# Patient Record
Sex: Female | Born: 1975 | Race: White | Hispanic: No | Marital: Married | Smoking: Never smoker
Health system: Southern US, Community
[De-identification: ages and names within clinical notes are randomized; demographics above are authoritative.]

## PROBLEM LIST (undated history)

## (undated) DIAGNOSIS — I82409 Acute embolism and thrombosis of unspecified deep veins of unspecified lower extremity: Secondary | ICD-10-CM

## (undated) DIAGNOSIS — K219 Gastro-esophageal reflux disease without esophagitis: Secondary | ICD-10-CM

---
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
## 2006-06-30 ENCOUNTER — Emergency Department: Payer: Self-pay | Admitting: Emergency Medicine

## 2012-12-05 ENCOUNTER — Emergency Department: Payer: Self-pay | Admitting: Emergency Medicine

## 2017-08-29 ENCOUNTER — Emergency Department
Admission: EM | Admit: 2017-08-29 | Discharge: 2017-08-29 | Disposition: A | Discharge status: Home / Self Care | Payer: BLUE CROSS/BLUE SHIELD | Attending: Emergency Medicine | Admitting: Emergency Medicine

## 2017-08-29 ENCOUNTER — Encounter: Payer: Self-pay | Admitting: Emergency Medicine

## 2017-08-29 DIAGNOSIS — Y92410 Unspecified street and highway as the place of occurrence of the external cause: Secondary | ICD-10-CM | POA: Diagnosis not present

## 2017-08-29 DIAGNOSIS — F17210 Nicotine dependence, cigarettes, uncomplicated: Secondary | ICD-10-CM | POA: Diagnosis not present

## 2017-08-29 DIAGNOSIS — Y999 Unspecified external cause status: Secondary | ICD-10-CM | POA: Diagnosis not present

## 2017-08-29 DIAGNOSIS — Y9389 Activity, other specified: Secondary | ICD-10-CM | POA: Insufficient documentation

## 2017-08-29 DIAGNOSIS — S50812A Abrasion of left forearm, initial encounter: Secondary | ICD-10-CM | POA: Insufficient documentation

## 2017-08-29 DIAGNOSIS — S161XXA Strain of muscle, fascia and tendon at neck level, initial encounter: Secondary | ICD-10-CM | POA: Insufficient documentation

## 2017-08-29 DIAGNOSIS — S199XXA Unspecified injury of neck, initial encounter: Secondary | ICD-10-CM | POA: Diagnosis present

## 2017-08-29 MED ORDER — IBUPROFEN 800 MG PO TABS
800.0000 mg | ORAL_TABLET | Freq: Once | ORAL | Status: AC
  Administered 2017-08-29: 800 mg via ORAL
  Filled 2017-08-29: qty 1

## 2017-08-29 MED ORDER — BACITRACIN-NEOMYCIN-POLYMYXIN 400-5-5000 EX OINT
TOPICAL_OINTMENT | Freq: Once | CUTANEOUS | Status: AC
  Administered 2017-08-29: 1 via TOPICAL
  Filled 2017-08-29: qty 1

## 2017-08-29 MED ORDER — CYCLOBENZAPRINE HCL 10 MG PO TABS
10.0000 mg | ORAL_TABLET | Freq: Once | ORAL | Status: AC
  Administered 2017-08-29: 10 mg via ORAL
  Filled 2017-08-29: qty 1

## 2017-08-29 MED ORDER — IBUPROFEN 800 MG PO TABS: 800.0000 mg | ORAL_TABLET | Freq: Three times a day (TID) | ORAL | 0 refills | Status: DC | PRN

## 2017-08-29 MED ORDER — CYCLOBENZAPRINE HCL 5 MG PO TABS: 5.0000 mg | ORAL_TABLET | Freq: Three times a day (TID) | ORAL | 0 refills | Status: DC | PRN

## 2017-08-29 NOTE — ED Notes (Signed)
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
## 2017-08-29 NOTE — Discharge Instructions (Signed)
Please take ibuprofen and Flexeril as prescribed. Apply ice to the neck and forearm 20 minutes every hour over the next couple of days. Wear Velcro wrist brace as needed for the next 2-3 days. Return to the ER for any increasing pain, worsening symptoms or changes in health. Follow-up with orthopedics if no improvement in one week.
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
## 2018-03-07 ENCOUNTER — Other Ambulatory Visit: Payer: Self-pay | Admitting: Obstetrics and Gynecology

## 2018-03-07 DIAGNOSIS — Z1231 Encounter for screening mammogram for malignant neoplasm of breast: Secondary | ICD-10-CM

## 2018-03-28 ENCOUNTER — Ambulatory Visit
Admission: RE | Admit: 2018-03-28 | Discharge: 2018-03-28 | Disposition: A | Discharge status: Home / Self Care | Payer: BLUE CROSS/BLUE SHIELD | Source: Ambulatory Visit | Attending: Obstetrics and Gynecology | Admitting: Obstetrics and Gynecology

## 2018-03-28 DIAGNOSIS — Z1231 Encounter for screening mammogram for malignant neoplasm of breast: Secondary | ICD-10-CM

## 2018-04-19 ENCOUNTER — Other Ambulatory Visit: Payer: Self-pay

## 2018-04-19 ENCOUNTER — Encounter
Admission: RE | Admit: 2018-04-19 | Discharge: 2018-04-19 | Disposition: A | Discharge status: Home / Self Care | Payer: BLUE CROSS/BLUE SHIELD | Source: Ambulatory Visit | Attending: Obstetrics and Gynecology | Admitting: Obstetrics and Gynecology

## 2018-04-19 DIAGNOSIS — Z01812 Encounter for preprocedural laboratory examination: Secondary | ICD-10-CM | POA: Insufficient documentation

## 2018-04-19 HISTORY — DX: Acute embolism and thrombosis of unspecified deep veins of unspecified lower extremity: I82.409

## 2018-04-19 HISTORY — DX: Gastro-esophageal reflux disease without esophagitis: K21.9

## 2018-04-19 LAB — CBC
HCT: 38.2 % (ref 35.0–47.0)
Hemoglobin: 12.9 g/dL (ref 12.0–16.0)
MCH: 31.6 pg (ref 26.0–34.0)
MCHC: 33.8 g/dL (ref 32.0–36.0)
MCV: 93.3 fL (ref 80.0–100.0)
PLATELETS: 257 10*3/uL (ref 150–440)
RBC: 4.09 MIL/uL (ref 3.80–5.20)
RDW: 13.6 % (ref 11.5–14.5)
WBC: 6 10*3/uL (ref 3.6–11.0)

## 2018-04-19 LAB — BASIC METABOLIC PANEL
Anion gap: 10 (ref 5–15)
BUN: 9 mg/dL (ref 6–20)
CO2: 24 mmol/L (ref 22–32)
CREATININE: 0.79 mg/dL (ref 0.44–1.00)
Calcium: 9 mg/dL (ref 8.9–10.3)
Chloride: 105 mmol/L (ref 101–111)
GFR calc non Af Amer: >60 mL/min (ref >60)
GLUCOSE: 88 mg/dL (ref 65–99)
Potassium: 3.8 mmol/L (ref 3.5–5.1)
Sodium: 139 mmol/L (ref 135–145)

## 2018-04-19 NOTE — Patient Instructions (Signed)
Your procedure is scheduled on: Friday, April 27, 2018 Report to Day Surgery on the 2nd floor of the [facility] To find out your arrival time, please call [phone] between 1PM - 3PM on: Thursday, April 26, 2018  REMEMBER: Instructions that are not followed completely may result in serious medical risk, up to and including death; or upon the discretion of your surgeon and anesthesiologist your surgery may need to be rescheduled.  Do not eat food after midnight the night before your procedure.  No gum chewing, lozengers or hard candies.  You may however, drink CLEAR liquids up to 2 hours before you are scheduled to arrive for your surgery. Do not drink anything within 2 hours of the start of your surgery.  Clear liquids include: - water  - apple juice without pulp - clear gatorade - black coffee or tea (Do NOT add anything to the coffee or tea) Do NOT drink anything that is not on this list.  No Alcohol for 24 hours before or after surgery.  No Smoking including e-cigarettes for 24 hours prior to surgery.  No chewable tobacco products for at least 6 hours prior to surgery.  No nicotine patches on the day of surgery.  On the morning of surgery brush your teeth with toothpaste and water, you may rinse your mouth with mouthwash if you wish. Do not swallow any toothpaste or mouthwash.  Notify your doctor if there is any change in your medical condition (cold, fever, infection).  Do not wear jewelry, make-up, hairpins, clips or nail polish.  Do not wear lotions, powders, or perfumes. You may wear deodorant.  Do not shave 48 hours prior to surgery.   Contacts and dentures may not be worn into surgery.  Do not bring valuables to the hospital, including drivers license, insurance or credit cards.  [facility] is not responsible for any belongings or valuables.   TAKE THESE MEDICATIONS THE MORNING OF SURGERY:  NONE  Use CHG Soap as directed on instruction sheet.  NOW!  Stop  Anti-inflammatories (NSAIDS) such as Advil, Aleve, Ibuprofen, Motrin, Naproxen, Naprosyn and Aspirin based products such as Excedrin, Goodys Powder, BC Powder. (May take Tylenol or Acetaminophen if needed.)  NOW!  Stop ANY OVER THE COUNTER supplements until after surgery.  Wear comfortable clothing (specific to your surgery type) to the hospital.  Plan for stool softeners for home use.  If you are being admitted to the hospital overnight, leave your suitcase in the car. After surgery it may be brought to your room.  If you are being discharged the day of surgery, you will not be allowed to drive home. You will need a responsible adult to drive you home and stay with you that night.   If you are taking public transportation, you will need to have a responsible adult with you. Please confirm with your physician that it is acceptable to use public transportation.   Please call [phone] if you have any questions about these instructions.

## 2018-04-19 NOTE — H&P (Signed)
Patient ID: [name] is a 42 y.o. female presenting with Pre Op Consulting (sign consents, preadmit after)  on 04/19/2018  HPI: Hx of irregular periods always. Worsening, q3-7 weeks, large clots  TVUS 5/19:  Ut wnl  retroverted Endometrium=15.88 mm bil ovs wnl  Hx of DVT with first pregnancy, requiring anticoagulation with next 2 pregnancies. Did have vascular surgeries in [place].  EMBx: collected 04/19/18 Pap smear: 4/19 neg with neg HPV   NSVD x2 C/S x1 for breech Hx of BTL  Caprini score of 7, high risk, giving her a 4.0% chance of VTE. Recommends SCDs and 7 days of LMWH  Past Medical History:  has a past medical history of H/O blood clots.  Past Surgical History:  has a past surgical history that includes Cesarean section. Family History: family history includes Addison's disease in her mother; Cancer in her maternal grandmother; Diabetes in her mother; Heart disease in her maternal grandfather and maternal grandmother; High blood pressure (Hypertension) in her mother. Social History:  reports that she has never smoked. She has never used smokeless tobacco. She reports that she drinks alcohol. She reports that she does not use drugs. OB/GYN History:  OB History    Gravida  3   Para  3   Term  3   Preterm      AB      Living  3     SAB      TAB      Ectopic      Molar      Multiple      Live Births             Allergies: has No Known Allergies. Medications: No current outpatient medications on file.   Review of Systems: No SOB, no palpitations or chest pain, no new lower extremity edema, no nausea or vomiting or bowel or bladder complaints. See HPI for gyn specific ROS.   Exam:     BP 120/82   Ht 162.6 cm ( )   Wt 93 kg (205 lb)   LMP 03/21/2018 (Exact Date)   BMI 35.19 kg/m   General: Patient is well-groomed, well-nourished, appears stated age in no acute distress  HEENT: head is atraumatic and normocephalic, trachea is  midline, neck is supple with no palpable nodules  CV: Regular rhythm and normal heart rate, +systolic ejection murmur  Pulm: Clear to auscultation throughout lung fields with no wheezing, crackles, or rhonchi. No increased work of breathing  Abdomen: soft , no mass, non-tender, no rebound tenderness, no hepatomegaly  Pelvic: tanner stage 5 ,   External genitalia: vulva /labia no lesions  Urethra: no prolapse  Vagina: normal physiologic d/c, laxity in vaginal walls  Cervix: no lesions, no cervical motion tenderness, good descent  Uterus: normal size shape and contour, non-tender  Adnexa: no mass,  non-tender    Rectovaginal: External wnl  EMBx  Impression:   Heavy vaginal bleeding   Plan:    Patient returns for a preoperative discussion regarding her plans to proceed with surgical treatment of her menmetrorrhagia by total laparoscopic hysterectomy with bilateral salpingectomy  procedure.  We will perform a cystoscopy to evaluate the urinary tract after the procedure.   Hx of DVT with high risk Caprini score: Lovenox  subQ preop and until fully ambulatory, for 4-7 days postop.  The patient and I discussed the technical aspects of the procedure including the potential for risks and complications.  These include but are not limited to the  risk of infection requiring post-operative antibiotics or further procedures.  We talked about the risk of injury to adjacent organs including bladder, bowel, ureter, blood vessels or nerves.  We talked about the need to convert to an open incision.  We talked about the possible need for blood transfusion.  We talked about postop complications such as thromboembolic or cardiopulmonary complications.  All of her questions were answered.  Her preoperative exam was completed and the appropriate consents were signed. She is scheduled to undergo this procedure in the near future.  Specific Peri-operative Considerations:  - Consent: obtained today -  Health Maintenance: up to date - Labs: CBC, CMP preoperatively - Studies: EKG, CXR preoperatively - Bowel Preparation: None required - Abx:  Cefoxitin 2g - VTE ppx: SCDs perioperatively - Glucose Protocol: n/a - Beta-blockade: n/a  No orders of the defined types were placed in this encounter.

## 2018-04-20 NOTE — OR Nursing (Signed)
Per Rosanne Ashing in the Blood Bank, patient has positive antibodies identified with Type and Screen.

## 2018-04-26 MED ORDER — CEFAZOLIN SODIUM-DEXTROSE 2-4 GM/100ML-% IV SOLN
2.0000 g | INTRAVENOUS | Status: AC
  Administered 2018-04-27 (×2): 2 g via INTRAVENOUS

## 2018-04-27 ENCOUNTER — Ambulatory Visit: Payer: BLUE CROSS/BLUE SHIELD | Admitting: Anesthesiology

## 2018-04-27 ENCOUNTER — Encounter
Admission: RE | Disposition: A | Discharge status: Other Acute Inpatient Hospital | Payer: Self-pay | Source: Home / Self Care | Attending: Obstetrics and Gynecology

## 2018-04-27 ENCOUNTER — Inpatient Hospital Stay
Admission: RE | Admit: 2018-04-27 | Discharge: 2018-05-19 | DRG: 742 | Disposition: A | Discharge status: Other Acute Inpatient Hospital | Payer: BLUE CROSS/BLUE SHIELD | Attending: Obstetrics and Gynecology | Admitting: Obstetrics and Gynecology

## 2018-04-27 DIAGNOSIS — R140 Abdominal distension (gaseous): Secondary | ICD-10-CM

## 2018-04-27 DIAGNOSIS — K660 Peritoneal adhesions (postprocedural) (postinfection): Secondary | ICD-10-CM | POA: Diagnosis present

## 2018-04-27 DIAGNOSIS — E8809 Other disorders of plasma-protein metabolism, not elsewhere classified: Secondary | ICD-10-CM | POA: Diagnosis not present

## 2018-04-27 DIAGNOSIS — I9581 Postprocedural hypotension: Secondary | ICD-10-CM | POA: Diagnosis not present

## 2018-04-27 DIAGNOSIS — E8770 Fluid overload, unspecified: Secondary | ICD-10-CM | POA: Diagnosis not present

## 2018-04-27 DIAGNOSIS — E875 Hyperkalemia: Secondary | ICD-10-CM | POA: Diagnosis not present

## 2018-04-27 DIAGNOSIS — R52 Pain, unspecified: Secondary | ICD-10-CM

## 2018-04-27 DIAGNOSIS — F4323 Adjustment disorder with mixed anxiety and depressed mood: Secondary | ICD-10-CM | POA: Diagnosis not present

## 2018-04-27 DIAGNOSIS — I82221 Chronic embolism and thrombosis of inferior vena cava: Secondary | ICD-10-CM | POA: Diagnosis present

## 2018-04-27 DIAGNOSIS — I82220 Acute embolism and thrombosis of inferior vena cava: Secondary | ICD-10-CM | POA: Diagnosis not present

## 2018-04-27 DIAGNOSIS — R000 Tachycardia, unspecified: Secondary | ICD-10-CM | POA: Diagnosis not present

## 2018-04-27 DIAGNOSIS — Y838 Other surgical procedures as the cause of abnormal reaction of the patient, or of later complication, without mention of misadventure at the time of the procedure: Secondary | ICD-10-CM | POA: Diagnosis not present

## 2018-04-27 DIAGNOSIS — J9690 Respiratory failure, unspecified, unspecified whether with hypoxia or hypercapnia: Secondary | ICD-10-CM

## 2018-04-27 DIAGNOSIS — N99841 Postprocedural hematoma of a genitourinary system organ or structure following other procedure: Secondary | ICD-10-CM | POA: Diagnosis not present

## 2018-04-27 DIAGNOSIS — N921 Excessive and frequent menstruation with irregular cycle: Principal | ICD-10-CM | POA: Diagnosis present

## 2018-04-27 DIAGNOSIS — R310 Gross hematuria: Secondary | ICD-10-CM | POA: Diagnosis not present

## 2018-04-27 DIAGNOSIS — Y846 Urinary catheterization as the cause of abnormal reaction of the patient, or of later complication, without mention of misadventure at the time of the procedure: Secondary | ICD-10-CM | POA: Diagnosis not present

## 2018-04-27 DIAGNOSIS — E871 Hypo-osmolality and hyponatremia: Secondary | ICD-10-CM | POA: Diagnosis not present

## 2018-04-27 DIAGNOSIS — Z452 Encounter for adjustment and management of vascular access device: Secondary | ICD-10-CM

## 2018-04-27 DIAGNOSIS — Z86718 Personal history of other venous thrombosis and embolism: Secondary | ICD-10-CM

## 2018-04-27 DIAGNOSIS — R601 Generalized edema: Secondary | ICD-10-CM | POA: Diagnosis not present

## 2018-04-27 DIAGNOSIS — N736 Female pelvic peritoneal adhesions (postinfective): Secondary | ICD-10-CM | POA: Diagnosis present

## 2018-04-27 DIAGNOSIS — Z6835 Body mass index (BMI) 35.0-35.9, adult: Secondary | ICD-10-CM

## 2018-04-27 DIAGNOSIS — N1330 Unspecified hydronephrosis: Secondary | ICD-10-CM | POA: Diagnosis not present

## 2018-04-27 DIAGNOSIS — I959 Hypotension, unspecified: Secondary | ICD-10-CM | POA: Diagnosis not present

## 2018-04-27 DIAGNOSIS — D62 Acute posthemorrhagic anemia: Secondary | ICD-10-CM | POA: Diagnosis not present

## 2018-04-27 DIAGNOSIS — Z90710 Acquired absence of both cervix and uterus: Secondary | ICD-10-CM | POA: Diagnosis present

## 2018-04-27 DIAGNOSIS — A419 Sepsis, unspecified organism: Secondary | ICD-10-CM | POA: Diagnosis not present

## 2018-04-27 DIAGNOSIS — E872 Acidosis: Secondary | ICD-10-CM | POA: Diagnosis present

## 2018-04-27 DIAGNOSIS — J9811 Atelectasis: Secondary | ICD-10-CM | POA: Diagnosis not present

## 2018-04-27 DIAGNOSIS — N179 Acute kidney failure, unspecified: Secondary | ICD-10-CM | POA: Diagnosis not present

## 2018-04-27 DIAGNOSIS — M7989 Other specified soft tissue disorders: Secondary | ICD-10-CM | POA: Diagnosis not present

## 2018-04-27 DIAGNOSIS — N3289 Other specified disorders of bladder: Secondary | ICD-10-CM | POA: Diagnosis not present

## 2018-04-27 DIAGNOSIS — K219 Gastro-esophageal reflux disease without esophagitis: Secondary | ICD-10-CM | POA: Diagnosis present

## 2018-04-27 DIAGNOSIS — R609 Edema, unspecified: Secondary | ICD-10-CM

## 2018-04-27 DIAGNOSIS — T8384XA Pain from genitourinary prosthetic devices, implants and grafts, initial encounter: Secondary | ICD-10-CM | POA: Diagnosis not present

## 2018-04-27 DIAGNOSIS — N990 Postprocedural (acute) (chronic) kidney failure: Secondary | ICD-10-CM | POA: Diagnosis not present

## 2018-04-27 DIAGNOSIS — F430 Acute stress reaction: Secondary | ICD-10-CM | POA: Diagnosis not present

## 2018-04-27 DIAGNOSIS — R34 Anuria and oliguria: Secondary | ICD-10-CM | POA: Diagnosis not present

## 2018-04-27 DIAGNOSIS — E861 Hypovolemia: Secondary | ICD-10-CM | POA: Diagnosis not present

## 2018-04-27 DIAGNOSIS — N170 Acute kidney failure with tubular necrosis: Secondary | ICD-10-CM | POA: Diagnosis not present

## 2018-04-27 DIAGNOSIS — K567 Ileus, unspecified: Secondary | ICD-10-CM | POA: Diagnosis not present

## 2018-04-27 DIAGNOSIS — Z4659 Encounter for fitting and adjustment of other gastrointestinal appliance and device: Secondary | ICD-10-CM

## 2018-04-27 DIAGNOSIS — N136 Pyonephrosis: Secondary | ICD-10-CM | POA: Diagnosis present

## 2018-04-27 DIAGNOSIS — E669 Obesity, unspecified: Secondary | ICD-10-CM | POA: Diagnosis present

## 2018-04-27 DIAGNOSIS — E876 Hypokalemia: Secondary | ICD-10-CM | POA: Diagnosis not present

## 2018-04-27 DIAGNOSIS — R600 Localized edema: Secondary | ICD-10-CM | POA: Diagnosis not present

## 2018-04-27 DIAGNOSIS — N1339 Other hydronephrosis: Secondary | ICD-10-CM

## 2018-04-27 DIAGNOSIS — N19 Unspecified kidney failure: Secondary | ICD-10-CM

## 2018-04-27 HISTORY — PX: LAPAROSCOPIC LYSIS OF ADHESIONS: SHX5905

## 2018-04-27 HISTORY — PX: SUPRACERVICAL ABDOMINAL HYSTERECTOMY: SHX5393

## 2018-04-27 HISTORY — PX: CYSTOSCOPY: SHX5120

## 2018-04-27 HISTORY — PX: UNILATERAL SALPINGECTOMY: SHX6160

## 2018-04-27 HISTORY — PX: SALPINGOOPHORECTOMY: SHX82

## 2018-04-27 LAB — POCT PREGNANCY, URINE: Preg Test, Ur: NEGATIVE

## 2018-04-27 LAB — CBC
HEMATOCRIT: 35.4 % (ref 35.0–47.0)
HEMATOCRIT: 39.1 % (ref 35.0–47.0)
HEMOGLOBIN: 12.2 g/dL (ref 12.0–16.0)
Hemoglobin: 13.4 g/dL (ref 12.0–16.0)
MCH: 31.1 pg (ref 26.0–34.0)
MCH: 30.8 pg (ref 26.0–34.0)
MCHC: 34.6 g/dL (ref 32.0–36.0)
MCHC: 34.4 g/dL (ref 32.0–36.0)
MCV: 89.9 fL (ref 80.0–100.0)
MCV: 89.7 fL (ref 80.0–100.0)
PLATELETS: 215 10*3/uL (ref 150–440)
Platelets: 220 10*3/uL (ref 150–440)
RBC: 3.94 MIL/uL (ref 3.80–5.20)
RBC: 4.35 MIL/uL (ref 3.80–5.20)
RDW: 14.7 % — ABNORMAL HIGH (ref 11.5–14.5)
RDW: 15.4 % — AB (ref 11.5–14.5)
WBC: 15.7 10*3/uL — ABNORMAL HIGH (ref 3.6–11.0)
WBC: 17.2 10*3/uL — ABNORMAL HIGH (ref 3.6–11.0)

## 2018-04-27 LAB — ABO/RH: ABO/RH(D): O POS

## 2018-04-27 LAB — MASSIVE TRANSFUSION PROTOCOL ORDER (BLOOD BANK NOTIFICATION)

## 2018-04-27 LAB — HEMOGLOBIN AND HEMATOCRIT, BLOOD
HCT: 36.5 % (ref 35.0–47.0)
Hemoglobin: 12.3 g/dL (ref 12.0–16.0)

## 2018-04-27 LAB — HEPARIN ANTI-XA

## 2018-04-27 LAB — MRSA PCR SCREENING: MRSA by PCR: NEGATIVE

## 2018-04-27 LAB — GLUCOSE, CAPILLARY: Glucose-Capillary: 172 mg/dL — ABNORMAL HIGH (ref 65–99)

## 2018-04-27 LAB — PREPARE RBC (CROSSMATCH)

## 2018-04-27 SURGERY — HYSTERECTOMY, SUPRACERVICAL, ABDOMINAL
Anesthesia: General | Laterality: Right | Wound class: Clean Contaminated

## 2018-04-27 MED ORDER — SUGAMMADEX SODIUM 200 MG/2ML IV SOLN
INTRAVENOUS | Status: AC
  Filled 2018-04-27: qty 2

## 2018-04-27 MED ORDER — DIPHENHYDRAMINE HCL 50 MG/ML IJ SOLN: 12.5000 mg | Freq: Four times a day (QID) | INTRAMUSCULAR | Status: DC | PRN

## 2018-04-27 MED ORDER — GLYCOPYRROLATE 0.2 MG/ML IJ SOLN
INTRAMUSCULAR | Status: AC
  Filled 2018-04-27: qty 1

## 2018-04-27 MED ORDER — FENTANYL CITRATE (PF) 100 MCG/2ML IJ SOLN
25.0000 ug | INTRAMUSCULAR | Status: DC | PRN
  Administered 2018-04-27: 25 ug via INTRAVENOUS

## 2018-04-27 MED ORDER — SODIUM CHLORIDE 0.9 % IV SOLN: Freq: Once | INTRAVENOUS | Status: DC

## 2018-04-27 MED ORDER — SODIUM CHLORIDE 0.9 % IV SOLN
INTRAVENOUS | Status: DC | PRN
  Administered 2018-04-27: 10 ug/min via INTRAVENOUS

## 2018-04-27 MED ORDER — BUPIVACAINE HCL (PF) 0.5 % IJ SOLN
INTRAMUSCULAR | Status: AC
  Filled 2018-04-27: qty 30

## 2018-04-27 MED ORDER — ALBUMIN HUMAN 5 % IV SOLN
INTRAVENOUS | Status: AC
  Filled 2018-04-27: qty 500

## 2018-04-27 MED ORDER — ENOXAPARIN SODIUM 40 MG/0.4ML ~~LOC~~ SOLN: 40.0000 mg | SUBCUTANEOUS | Status: DC

## 2018-04-27 MED ORDER — PROPOFOL 10 MG/ML IV BOLUS
INTRAVENOUS | Status: AC
  Filled 2018-04-27: qty 20

## 2018-04-27 MED ORDER — SODIUM CHLORIDE 0.9 % IV SOLN: INTRAVENOUS | Status: DC

## 2018-04-27 MED ORDER — PHENYLEPHRINE HCL 10 MG/ML IJ SOLN
INTRAMUSCULAR | Status: DC | PRN
  Administered 2018-04-27 (×2): 200 ug via INTRAVENOUS
  Administered 2018-04-27: 100 ug via INTRAVENOUS
  Administered 2018-04-27 (×4): 200 ug via INTRAVENOUS
  Administered 2018-04-27: 100 ug via INTRAVENOUS
  Administered 2018-04-27 (×3): 200 ug via INTRAVENOUS

## 2018-04-27 MED ORDER — GLYCOPYRROLATE 0.2 MG/ML IJ SOLN
INTRAMUSCULAR | Status: DC | PRN
  Administered 2018-04-27: 0.1 mg via INTRAVENOUS

## 2018-04-27 MED ORDER — HYDROMORPHONE 1 MG/ML IV SOLN
INTRAVENOUS | Status: DC
  Administered 2018-04-27: 25 mg via INTRAVENOUS
  Administered 2018-04-27: 0.5 mg via INTRAVENOUS
  Administered 2018-04-28: 0.6 mg via INTRAVENOUS
  Administered 2018-04-28: 0 mg via INTRAVENOUS
  Administered 2018-04-28: 0.9 mg via INTRAVENOUS
  Administered 2018-04-28: 0.3 mg via INTRAVENOUS
  Administered 2018-04-29: 0.6 mg via INTRAVENOUS
  Filled 2018-04-27: qty 25

## 2018-04-27 MED ORDER — FAMOTIDINE 20 MG PO TABS
ORAL_TABLET | ORAL | Status: AC
  Administered 2018-04-27: 20 mg via ORAL
  Filled 2018-04-27: qty 1

## 2018-04-27 MED ORDER — LIDOCAINE HCL (CARDIAC) PF 100 MG/5ML IV SOSY
PREFILLED_SYRINGE | INTRAVENOUS | Status: DC | PRN
  Administered 2018-04-27: 50 mg via INTRAVENOUS

## 2018-04-27 MED ORDER — EPHEDRINE SULFATE 50 MG/ML IJ SOLN
INTRAMUSCULAR | Status: DC | PRN
  Administered 2018-04-27 (×3): 10 mg via INTRAVENOUS

## 2018-04-27 MED ORDER — FAMOTIDINE 20 MG PO TABS
20.0000 mg | ORAL_TABLET | Freq: Once | ORAL | Status: AC
  Administered 2018-04-27: 20 mg via ORAL

## 2018-04-27 MED ORDER — STERILE WATER FOR IRRIGATION IR SOLN
Status: DC | PRN
  Administered 2018-04-27: 160 mL via INTRAVESICAL

## 2018-04-27 MED ORDER — ALBUMIN HUMAN 5 % IV SOLN
INTRAVENOUS | Status: AC
  Filled 2018-04-27: qty 250

## 2018-04-27 MED ORDER — ONDANSETRON HCL 4 MG/2ML IJ SOLN
4.0000 mg | Freq: Four times a day (QID) | INTRAMUSCULAR | Status: DC | PRN
  Administered 2018-04-28 – 2018-05-19 (×14): 4 mg via INTRAVENOUS
  Filled 2018-04-27 (×16): qty 2

## 2018-04-27 MED ORDER — GABAPENTIN 300 MG PO CAPS
900.0000 mg | ORAL_CAPSULE | ORAL | Status: AC
  Administered 2018-04-27: 900 mg via ORAL

## 2018-04-27 MED ORDER — LACTATED RINGERS IV SOLN
INTRAVENOUS | Status: DC
  Administered 2018-04-27 (×4): via INTRAVENOUS

## 2018-04-27 MED ORDER — CALCIUM CHLORIDE 10 % IV SOLN
INTRAVENOUS | Status: AC
  Filled 2018-04-27: qty 10

## 2018-04-27 MED ORDER — CEFAZOLIN SODIUM-DEXTROSE 2-4 GM/100ML-% IV SOLN
INTRAVENOUS | Status: AC
  Filled 2018-04-27: qty 100

## 2018-04-27 MED ORDER — ONDANSETRON HCL 4 MG/2ML IJ SOLN
INTRAMUSCULAR | Status: AC
  Filled 2018-04-27: qty 2

## 2018-04-27 MED ORDER — SODIUM CHLORIDE 0.9 % IV SOLN
INTRAVENOUS | Status: DC | PRN
  Administered 2018-04-27: 11:00:00 via INTRAVENOUS

## 2018-04-27 MED ORDER — FENTANYL CITRATE (PF) 250 MCG/5ML IJ SOLN
INTRAMUSCULAR | Status: AC
  Filled 2018-04-27: qty 5

## 2018-04-27 MED ORDER — DOCUSATE SODIUM 100 MG PO CAPS
100.0000 mg | ORAL_CAPSULE | Freq: Two times a day (BID) | ORAL | Status: DC
  Administered 2018-04-27 – 2018-04-30 (×4): 100 mg via ORAL
  Filled 2018-04-27 (×4): qty 1

## 2018-04-27 MED ORDER — NALOXONE HCL 0.4 MG/ML IJ SOLN: 0.4000 mg | INTRAMUSCULAR | Status: DC | PRN

## 2018-04-27 MED ORDER — ONDANSETRON HCL 4 MG/2ML IJ SOLN
INTRAMUSCULAR | Status: DC | PRN
  Administered 2018-04-27 (×2): 4 mg via INTRAVENOUS

## 2018-04-27 MED ORDER — KETAMINE HCL 50 MG/ML IJ SOLN
INTRAMUSCULAR | Status: AC
  Filled 2018-04-27: qty 10

## 2018-04-27 MED ORDER — MIDAZOLAM HCL 2 MG/2ML IJ SOLN
INTRAMUSCULAR | Status: DC | PRN
  Administered 2018-04-27: 3 mg via INTRAVENOUS
  Administered 2018-04-27: 2 mg via INTRAVENOUS

## 2018-04-27 MED ORDER — MIDAZOLAM HCL 5 MG/5ML IJ SOLN
INTRAMUSCULAR | Status: AC
  Filled 2018-04-27: qty 5

## 2018-04-27 MED ORDER — BUPIVACAINE LIPOSOME 1.3 % IJ SUSP
INTRAMUSCULAR | Status: AC
  Filled 2018-04-27: qty 20

## 2018-04-27 MED ORDER — SODIUM CHLORIDE FLUSH 0.9 % IV SOLN
INTRAVENOUS | Status: AC
  Filled 2018-04-27: qty 50

## 2018-04-27 MED ORDER — PROPOFOL 10 MG/ML IV BOLUS
INTRAVENOUS | Status: DC | PRN
  Administered 2018-04-27: 150 mg via INTRAVENOUS
  Administered 2018-04-27: 50 mg via INTRAVENOUS

## 2018-04-27 MED ORDER — GABAPENTIN 400 MG PO CAPS
ORAL_CAPSULE | ORAL | Status: AC
  Administered 2018-04-27: 900 mg via ORAL
  Filled 2018-04-27: qty 1

## 2018-04-27 MED ORDER — BUPIVACAINE HCL (PF) 0.5 % IJ SOLN
INTRAMUSCULAR | Status: DC | PRN
  Administered 2018-04-27: 50 mL

## 2018-04-27 MED ORDER — LACTATED RINGERS IV SOLN
INTRAVENOUS | Status: DC
  Administered 2018-04-27 – 2018-04-28 (×3): via INTRAVENOUS

## 2018-04-27 MED ORDER — MENTHOL 3 MG MT LOZG
1.0000 | LOZENGE | OROMUCOSAL | Status: DC | PRN
  Filled 2018-04-27: qty 9

## 2018-04-27 MED ORDER — PHENYLEPHRINE HCL 10 MG/ML IJ SOLN
INTRAMUSCULAR | Status: AC
  Filled 2018-04-27: qty 1

## 2018-04-27 MED ORDER — EPHEDRINE SULFATE 50 MG/ML IJ SOLN
INTRAMUSCULAR | Status: AC
  Filled 2018-04-27: qty 1

## 2018-04-27 MED ORDER — DEXAMETHASONE SODIUM PHOSPHATE 10 MG/ML IJ SOLN
INTRAMUSCULAR | Status: DC | PRN
  Administered 2018-04-27: 10 mg via INTRAVENOUS

## 2018-04-27 MED ORDER — EVICEL 5 ML EX KIT
PACK | CUTANEOUS | Status: DC | PRN
  Administered 2018-04-27: 5 mL via TOPICAL

## 2018-04-27 MED ORDER — ONDANSETRON HCL 4 MG/2ML IJ SOLN
4.0000 mg | Freq: Four times a day (QID) | INTRAMUSCULAR | Status: DC | PRN
  Administered 2018-04-27 – 2018-04-28 (×2): 4 mg via INTRAVENOUS

## 2018-04-27 MED ORDER — SIMETHICONE 80 MG PO CHEW
160.0000 mg | CHEWABLE_TABLET | Freq: Four times a day (QID) | ORAL | Status: DC | PRN
  Administered 2018-04-27: 160 mg via ORAL
  Filled 2018-04-27: qty 2

## 2018-04-27 MED ORDER — CELECOXIB 200 MG PO CAPS
400.0000 mg | ORAL_CAPSULE | ORAL | Status: AC
Start: 2018-04-27 — End: 2018-04-27
  Administered 2018-04-27: 400 mg via ORAL

## 2018-04-27 MED ORDER — SODIUM CHLORIDE 0.9% FLUSH: 9.0000 mL | INTRAVENOUS | Status: DC | PRN

## 2018-04-27 MED ORDER — ROCURONIUM BROMIDE 50 MG/5ML IV SOLN
INTRAVENOUS | Status: AC
  Filled 2018-04-27: qty 1

## 2018-04-27 MED ORDER — ACETAMINOPHEN 500 MG PO TABS
1000.0000 mg | ORAL_TABLET | ORAL | Status: AC
  Administered 2018-04-27: 1000 mg via ORAL

## 2018-04-27 MED ORDER — ROCURONIUM BROMIDE 100 MG/10ML IV SOLN
INTRAVENOUS | Status: DC | PRN
  Administered 2018-04-27: 10 mg via INTRAVENOUS
  Administered 2018-04-27 (×3): 20 mg via INTRAVENOUS
  Administered 2018-04-27: 10 mg via INTRAVENOUS
  Administered 2018-04-27: 20 mg via INTRAVENOUS
  Administered 2018-04-27: 50 mg via INTRAVENOUS

## 2018-04-27 MED ORDER — KETAMINE HCL 10 MG/ML IJ SOLN
INTRAMUSCULAR | Status: DC | PRN
  Administered 2018-04-27 (×2): 25 mg via INTRAVENOUS

## 2018-04-27 MED ORDER — ENOXAPARIN SODIUM 40 MG/0.4ML ~~LOC~~ SOLN
40.0000 mg | SUBCUTANEOUS | Status: AC
  Administered 2018-04-27: 40 mg via SUBCUTANEOUS
  Filled 2018-04-27 (×2): qty 0.4

## 2018-04-27 MED ORDER — DIPHENHYDRAMINE HCL 12.5 MG/5ML PO ELIX
12.5000 mg | ORAL_SOLUTION | Freq: Four times a day (QID) | ORAL | Status: DC | PRN
  Filled 2018-04-27: qty 5

## 2018-04-27 MED ORDER — HYDROMORPHONE HCL 1 MG/ML IJ SOLN
INTRAMUSCULAR | Status: DC | PRN
  Administered 2018-04-27 (×2): 1 mg via INTRAVENOUS

## 2018-04-27 MED ORDER — ONDANSETRON HCL 4 MG/2ML IJ SOLN: 4.0000 mg | Freq: Once | INTRAMUSCULAR | Status: DC | PRN

## 2018-04-27 MED ORDER — HYDROMORPHONE HCL 1 MG/ML IJ SOLN
INTRAMUSCULAR | Status: AC
  Filled 2018-04-27: qty 1

## 2018-04-27 MED ORDER — ROCURONIUM BROMIDE 50 MG/5ML IV SOLN
INTRAVENOUS | Status: AC
  Filled 2018-04-27: qty 2

## 2018-04-27 MED ORDER — ONDANSETRON HCL 4 MG PO TABS: 4.0000 mg | ORAL_TABLET | Freq: Four times a day (QID) | ORAL | Status: DC | PRN

## 2018-04-27 MED ORDER — CELECOXIB 200 MG PO CAPS
ORAL_CAPSULE | ORAL | Status: AC
  Administered 2018-04-27: 400 mg via ORAL
  Filled 2018-04-27: qty 2

## 2018-04-27 MED ORDER — SUGAMMADEX SODIUM 200 MG/2ML IV SOLN
INTRAVENOUS | Status: DC | PRN
  Administered 2018-04-27: 100 mg via INTRAVENOUS
  Administered 2018-04-27: 200 mg via INTRAVENOUS

## 2018-04-27 MED ORDER — SODIUM CHLORIDE 0.9 % IV SOLN
INTRAVENOUS | Status: DC | PRN
  Administered 2018-04-27: 70 mL

## 2018-04-27 MED ORDER — EVICEL 5 ML EX KIT
PACK | CUTANEOUS | Status: AC
  Filled 2018-04-27: qty 1

## 2018-04-27 MED ORDER — CALCIUM CHLORIDE 10 % IV SOLN
INTRAVENOUS | Status: DC | PRN
  Administered 2018-04-27: 1 g via INTRAVENOUS

## 2018-04-27 MED ORDER — LIDOCAINE HCL (PF) 2 % IJ SOLN
INTRAMUSCULAR | Status: AC
  Filled 2018-04-27: qty 10

## 2018-04-27 MED ORDER — ALBUMIN HUMAN 5 % IV SOLN
INTRAVENOUS | Status: DC | PRN
  Administered 2018-04-27 (×3): via INTRAVENOUS

## 2018-04-27 MED ORDER — PROMETHAZINE HCL 25 MG RE SUPP
25.0000 mg | Freq: Four times a day (QID) | RECTAL | Status: DC | PRN
  Administered 2018-04-27: 25 mg via RECTAL
  Filled 2018-04-27: qty 1

## 2018-04-27 MED ORDER — DEXAMETHASONE SODIUM PHOSPHATE 10 MG/ML IJ SOLN
INTRAMUSCULAR | Status: AC
  Filled 2018-04-27: qty 1

## 2018-04-27 MED ORDER — FENTANYL CITRATE (PF) 100 MCG/2ML IJ SOLN
INTRAMUSCULAR | Status: DC | PRN
  Administered 2018-04-27: 50 ug via INTRAVENOUS
  Administered 2018-04-27: 150 ug via INTRAVENOUS
  Administered 2018-04-27: 100 ug via INTRAVENOUS
  Administered 2018-04-27 (×2): 50 ug via INTRAVENOUS
  Administered 2018-04-27 (×3): 100 ug via INTRAVENOUS
  Administered 2018-04-27: 50 ug via INTRAVENOUS

## 2018-04-27 MED ORDER — ACETAMINOPHEN 500 MG PO TABS
ORAL_TABLET | ORAL | Status: AC
  Administered 2018-04-27: 1000 mg via ORAL
  Filled 2018-04-27: qty 2

## 2018-04-27 MED ORDER — FENTANYL CITRATE (PF) 100 MCG/2ML IJ SOLN
INTRAMUSCULAR | Status: AC
  Administered 2018-04-27: 25 ug via INTRAVENOUS
  Filled 2018-04-27: qty 2

## 2018-04-27 MED ORDER — PROMETHAZINE HCL 25 MG/ML IJ SOLN
12.5000 mg | Freq: Four times a day (QID) | INTRAMUSCULAR | Status: DC | PRN
  Administered 2018-04-28 – 2018-04-29 (×5): 25 mg via INTRAVENOUS
  Filled 2018-04-27 (×6): qty 1

## 2018-04-27 MED ORDER — METHYLENE BLUE 0.5 % INJ SOLN
INTRAVENOUS | Status: AC
  Filled 2018-04-27: qty 10

## 2018-04-27 SURGICAL SUPPLY — 97 items
APPLIER CLIP 13 LRG OPEN (CLIP) ×7
BAG URINE DRAINAGE (UROLOGICAL SUPPLIES) ×7 IMPLANT
BARRIER ADHS 3X4 INTERCEED (GAUZE/BANDAGES/DRESSINGS) ×14 IMPLANT
BENZOIN TINCTURE PRP APPL 2/3 (GAUZE/BANDAGES/DRESSINGS) ×7 IMPLANT
BLADE SURG SZ10 CARB STEEL (BLADE) ×7 IMPLANT
BLADE SURG SZ11 CARB STEEL (BLADE) ×7 IMPLANT
CATH FOLEY 2WAY  5CC 16FR (CATHETERS) ×2
CATH ROBINSON RED A/P 16FR (CATHETERS) ×7 IMPLANT
CATH URTH 16FR FL 2W BLN LF (CATHETERS) ×5 IMPLANT
CHLORAPREP W/TINT 26ML (MISCELLANEOUS) ×7 IMPLANT
CLIP APPLIE 13 LRG OPEN (CLIP) ×5 IMPLANT
CLOSURE WOUND 1/2 X4 (GAUZE/BANDAGES/DRESSINGS) ×1
CLOSURE WOUND 1/4X4 (GAUZE/BANDAGES/DRESSINGS) ×1
CNTNR SPEC 2.5X3XGRAD LEK (MISCELLANEOUS) ×5
CONT SPEC 4OZ STER OR WHT (MISCELLANEOUS) ×2
CONTAINER SPEC 2.5X3XGRAD LEK (MISCELLANEOUS) ×5 IMPLANT
CORD MONOPOLAR M/FML 12FT (MISCELLANEOUS) ×7 IMPLANT
COUNTER NEEDLE 20/40 LG (NEEDLE) ×28 IMPLANT
COVER LIGHT HANDLE STERIS (MISCELLANEOUS) ×14 IMPLANT
DERMABOND ADVANCED (GAUZE/BANDAGES/DRESSINGS) ×2
DERMABOND ADVANCED .7 DNX12 (GAUZE/BANDAGES/DRESSINGS) ×5 IMPLANT
DEVICE SUTURE ENDOST 10MM (ENDOMECHANICALS) IMPLANT
DRAPE STERI POUCH LG 24X46 STR (DRAPES) ×7 IMPLANT
DRSG OPSITE POSTOP 4X12 (GAUZE/BANDAGES/DRESSINGS) ×7 IMPLANT
DRSG TEGADERM 2-3/8X2-3/4 SM (GAUZE/BANDAGES/DRESSINGS) ×21 IMPLANT
ELECT BLADE 6.5 EXT (BLADE) ×7 IMPLANT
ELECT CAUTERY BLADE 6.4 (BLADE) ×7 IMPLANT
GLOVE BIO SURGEON STRL SZ7 (GLOVE) ×42 IMPLANT
GLOVE INDICATOR 7.5 STRL GRN (GLOVE) ×42 IMPLANT
GOWN STRL REUS W/ TWL LRG LVL3 (GOWN DISPOSABLE) ×20 IMPLANT
GOWN STRL REUS W/ TWL XL LVL3 (GOWN DISPOSABLE) ×5 IMPLANT
GOWN STRL REUS W/TWL LRG LVL3 (GOWN DISPOSABLE) ×8
GOWN STRL REUS W/TWL XL LVL3 (GOWN DISPOSABLE) ×2
GRADUATE 1200CC STRL 31836 (MISCELLANEOUS) ×7 IMPLANT
GRASPER SUT TROCAR 14GX15 (MISCELLANEOUS) ×7 IMPLANT
HANDLE YANKAUER SUCT BULB TIP (MISCELLANEOUS) ×28 IMPLANT
HEMOSTAT ARISTA ABSORB 1G (MISCELLANEOUS) ×7 IMPLANT
HEMOSTAT ARISTA ABSORB 3G PWDR (MISCELLANEOUS) ×14 IMPLANT
IRRIGATION STRYKERFLOW (MISCELLANEOUS) IMPLANT
IRRIGATOR STRYKERFLOW (MISCELLANEOUS)
IV LACTATED RINGERS 1000ML (IV SOLUTION) ×7 IMPLANT
IV NS 1000ML (IV SOLUTION) ×2
IV NS 1000ML BAXH (IV SOLUTION) ×5 IMPLANT
KIT PINK PAD W/HEAD ARE REST (MISCELLANEOUS) ×7
KIT PINK PAD W/HEAD ARM REST (MISCELLANEOUS) ×5 IMPLANT
KIT TURNOVER CYSTO (KITS) ×7 IMPLANT
LABEL OR SOLS (LABEL) ×7 IMPLANT
LIGASURE VESSEL 5MM BLUNT TIP (ELECTROSURGICAL) ×7 IMPLANT
MANIPULATOR VCARE LG CRV RETR (MISCELLANEOUS) IMPLANT
MANIPULATOR VCARE SML CRV RETR (MISCELLANEOUS) IMPLANT
MANIPULATOR VCARE STD CRV RETR (MISCELLANEOUS) ×7 IMPLANT
NDL HPO THNWL 1X22GA REG BVL (NEEDLE) ×10 IMPLANT
NEEDLE SAFETY 22GX1 (NEEDLE) ×4
NS IRRIG 500ML POUR BTL (IV SOLUTION) ×7 IMPLANT
OCCLUDER COLPOPNEUMO (BALLOONS) ×7 IMPLANT
PACK GYN LAPAROSCOPIC (MISCELLANEOUS) ×7 IMPLANT
PAD OB MATERNITY 4.3X12.25 (PERSONAL CARE ITEMS) ×7 IMPLANT
PAD PREP 24X41 OB/GYN DISP (PERSONAL CARE ITEMS) ×7 IMPLANT
PENCIL ELECTRO HAND CTR (MISCELLANEOUS) ×7 IMPLANT
POUCH SPECIMEN RETRIEVAL 10MM (ENDOMECHANICALS) IMPLANT
SCISSORS METZENBAUM CVD 33 (INSTRUMENTS) IMPLANT
SET CYSTO W/LG BORE CLAMP LF (SET/KITS/TRAYS/PACK) ×7 IMPLANT
SLEEVE ENDOPATH XCEL 5M (ENDOMECHANICALS) ×7 IMPLANT
SPONGE GAUZE 2X2 8PLY STER LF (GAUZE/BANDAGES/DRESSINGS) ×2
SPONGE GAUZE 2X2 8PLY STRL LF (GAUZE/BANDAGES/DRESSINGS) ×12 IMPLANT
SPONGE LAP 18X18 5 PK (GAUZE/BANDAGES/DRESSINGS) ×28 IMPLANT
STAPLER INSORB 30 2030 C-SECTI (MISCELLANEOUS) ×7 IMPLANT
STRIP CLOSURE SKIN 1/2X4 (GAUZE/BANDAGES/DRESSINGS) ×6 IMPLANT
STRIP CLOSURE SKIN 1/4X4 (GAUZE/BANDAGES/DRESSINGS) ×6 IMPLANT
SURGILUBE 2OZ TUBE FLIPTOP (MISCELLANEOUS) ×7 IMPLANT
SUT CHROMIC 2 0 SH (SUTURE) ×7 IMPLANT
SUT ENDO VLOC 180-0-8IN (SUTURE) IMPLANT
SUT MAXON 2-0 1X36 HBGS-21 (SUTURE) ×14 IMPLANT
SUT MNCRL 4-0 (SUTURE) ×2
SUT MNCRL 4-0 27XMFL (SUTURE) ×5
SUT MNCRL AB 4-0 PS2 18 (SUTURE) ×7 IMPLANT
SUT PLAIN 2 0 XLH (SUTURE) ×7 IMPLANT
SUT VIC AB 0 CT1 27 (SUTURE) ×10
SUT VIC AB 0 CT1 27XCR 8 STRN (SUTURE) ×25 IMPLANT
SUT VIC AB 0 CT1 36 (SUTURE) ×35 IMPLANT
SUT VIC AB 1 CT1 36 (SUTURE) ×14 IMPLANT
SUT VIC AB 2-0 SH 27 (SUTURE) ×6
SUT VIC AB 2-0 SH 27XBRD (SUTURE) ×15 IMPLANT
SUT VIC AB 2-0 UR6 27 (SUTURE) ×7 IMPLANT
SUT VIC AB 4-0 SH 27 (SUTURE) ×2
SUT VIC AB 4-0 SH 27XANBCTRL (SUTURE) ×5 IMPLANT
SUTURE MNCRL 4-0 27XMF (SUTURE) ×5 IMPLANT
SUTURE MNCRYL 4-0 (SUTURE) ×14 IMPLANT
SYR 10ML LL (SYRINGE) ×7 IMPLANT
SYR 30ML LL (SYRINGE) ×21 IMPLANT
SYR 50ML LL SCALE MARK (SYRINGE) ×7 IMPLANT
TROCAR ENDO BLADELESS 11MM (ENDOMECHANICALS) IMPLANT
TROCAR XCEL NON-BLD 5MMX100MML (ENDOMECHANICALS) ×7 IMPLANT
TUBING CONNECTING 10 (TUBING) ×6 IMPLANT
TUBING CONNECTING 10' (TUBING) ×1
TUBING INSUF HEATED (TUBING) ×7 IMPLANT
TUBING INSUFFLATION (TUBING) ×7 IMPLANT

## 2018-04-27 NOTE — Progress Notes (Signed)
Day of Surgery Procedure(s): ATTEMPTED LAPAROSCOPIC HYSTERECTOMY CONVERTED TO SUPRACERVICAL ABDOMINAL HYSTERECTOMY (N/A) SALPINGO OOPHORECTOMY (Left) CYSTOSCOPY (N/A) UNILATERAL SALPINGECTOMY (Right) LAPAROSCOPIC LYSIS OF ADHESIONS, RETROPERITONEAL DISSECTION Subjective: Pt still drowsy, with nausea/vomiting unresponsive to antiemetics, on PCA. Feeling hot, clammy to touch. Bright red urine.   Objective: Vital signs in last 24 hours: Temp:  [97 F (36.1 C)-98 F (36.7 C)] 98 F (36.7 C) (06/07 1948) Pulse Rate:  [72-114] 113 (06/07 1948) Resp:  [9-23] 20 (06/07 1948) BP: (103-127)/(62-81) 103/62 (06/07 1948) SpO2:  [94 %-100 %] 98 % (06/07 1948) Arterial Line BP: (133-165)/(68-80) 133/70 (06/07 1625)  Intake/Output  Intake/Output Summary (Last 24 hours) at 04/27/2018 2011 Last data filed at 04/27/2018 1800 Gross per 24 hour  Intake 6848 ml  Output 4420 ml  Net 2428 ml    Physical Exam:  General: Drowsy, oriented. CV: RRR - some increased work of breathing Lungs: Clear bilaterally. GI: Soft, Nondistended. Incisions: Clean and dry. Urine: bright red, Foley in place Extremities: Nontender, no erythema, no edema. Pale, right arm and hand swollen, left normal.  Assessment/Plan: POD# 0 s/p above procedures.  1) She appears concerning to me. Despite her stable BP vital signs, I feel uncomfortable and would like her to be on closer monitoring. She appears to have an increased work of breathing, is arouseable but drowsy, and has significant nausea. She in intermittently tachycardic. I am going to recommend transfer to stepdown unit for overnight monitoring.  2. Hx of DVT, will not restart lovenox x24hrs. Hold toradol and motrin as well.  3. She did get repeat dose of Ancef in the OR, but I will watch her carefully for infection.    Christeen DouglasBethany Akon Reinoso, MD   LOS: 0 days   Christeen DouglasBethany Yoshiko Keleher 04/27/2018, 8:11 PM Patient ID: Jonelle SidleGinger B Buchner, female   DOB: 11/28/1975, 42 y.o.   MRN:  [id number]

## 2018-04-27 NOTE — Progress Notes (Signed)
Patient transported by nursing staff to ICU per MD orders. Patient stable during transport. Report given to [facility]eth, RN in ICU.
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
## 2018-04-27 NOTE — Discharge Instructions (Signed)
For the next three days, take ibuprofen and acetaminophen on a schedule, every 8 hours. You can take them together or you can intersperse them, and take one every four hours. I also gave you gabapentin for nighttime, to help you sleep and also to control pain. Take gabapentin medicines at night for at least the next 3 nights. You also have a narcotic, oxycodone, to take as needed if the above medicines don't help.  Postop constipation is a major cause of pain. Stay well hydrated, walk as you tolerate, and take over the counter senna as well as stool softeners if you need them.   Signs and Symptoms to Report Call our office at [phone] if you have any of the following.   Fever over 100.4 degrees or higher  Severe stomach pain not relieved with pain medications  Bright red bleeding that's heavier than a period that does not slow with rest  To go the bathroom a lot (frequency), you can't hold your urine (urgency), or it hurts when you empty your bladder (urinate)  Chest pain  Shortness of breath  Pain in the calves of your legs  Severe nausea and vomiting not relieved with anti-nausea medications  Signs of infection around your wounds, such as redness, hot to touch, swelling, green/yellow drainage (like pus), bad smelling discharge  Any concerns  What You Can Expect after Surgery  You may see some pink tinged, bloody fluid and bruising around the wound. This is normal.  You may have a sore throat because of the tube in your mouth during general anesthesia. This will go away in 2 to 3 days.  You may have some stomach cramps.  You may notice spotting on your panties.  You may have pain around the incision sites.   Activities after Your Discharge Follow these guidelines to help speed your recovery at home:  Do the coughing and deep breathing as you did in the hospital for 2 weeks. Use the small blue breathing device, called the incentive spirometer for 2 weeks.  Don't drive if you are  in pain or taking narcotic pain medicine. You may drive when you can safely slam on the brakes, turn the wheel forcefully, and rotate your torso comfortably. This is typically 1-2 weeks. Practice in a parking lot or side street prior to attempting to drive regularly.   Ask others to help with household chores for 4 weeks.  Do not lift anything heavier that 10 pounds for 4-6 weeks. This includes pets, children, and groceries.  Don't do strenuous activities, exercises, or sports like vacuuming, tennis, squash, etc. until your doctor says it is safe to do so. ---If you had a hysterectomy (abdominal, laparoscopic, or vaginal) do not have intercourse for 8-10 weeks.   Walk as you feel able. Rest often since it may take two or three weeks for your energy level to return to normal.   You may climb stairs  Avoid constipation:   -Eat fruits, vegetables, and whole grains. Eat small meals as your appetite will take time to return to normal.   -Drink 6 to 8 glasses of water each day unless your doctor has told you to limit your fluids.   -Use a laxative or stool softener as needed if constipation becomes a problem. You may take Miralax, metamucil, Citrucil, Colace, Senekot, FiberCon, etc. If this does not relieve the constipation, try two tablespoons of Milk Of Magnesia every 8 hours until your bowels move.   You may shower. Gently wash   the wounds with a mild soap and water. Pat dry.  Do not get in a hot tub, swimming pool, etc. until your doctor agrees.  Do not use lotions, oils, powders on the wounds.  Do not douche, use tampons, or have sex until your doctor says it is okay.  Take your pain medicine when you need it. The medicine may not work as well if the pain is bad.  Take the medicines you were taking before surgery. Other medications you will need are pain medications (Norco or Percocet) and nausea medications (Zofran).  Here is a helpful article from the website  http://mitchell.org/BootyMD.com, regarding constipation  Here are reasons why constipation occurs after surgery: 1) During the operation and in the recovery room, most people are given opioid pain medication, primarily through an IV, to treat moderate or severe pain. Intravenous opioids include morphine, Dilaudid and fentanyl. After surgery, patients are often prescribed opioid pain medication to take by mouth at home, including codeine, Vicodin, Norco, and Percocet. All of these medications cause constipation by slowing down the movement of your intestine. 2) Changes in your diet before surgery can be another culprit. It is common to get specific instructions to change how you normally eat or drink before your surgery, like only having liquids the day before or not having anything to eat or drink after midnight the night before surgery. For this reason, temporary dehydration may occur. This, along with not eating or only having liquids, means that you are getting less fiber than usual. Both these factors contribute to constipation. 3) Changes in your diet after surgery can also contribute to the problem. Although many people dont have dietary restrictions after operations, being under anesthesia can make you lose your appetite for several hours and maybe even days. Some people can even have nausea or vomiting. Not eating or drinking normally means that you are not getting enough fiber and you can get dehydrated, both leading to constipation. 4) Lying in a bed more than usual--which happens before, during and after surgery--combined with the medications and diet changes, all work together to slow down your colon and make your poop turn to rock.  No one likes to be constipated.  Lets face it, its not a pleasant feeling when you dont poop for days, then strain on the toilet to finally pass something large enough to cause damage. An ounce of prevention is worth a pound of cure, so: 1. Assume you will be  constipated. 2. Plan and prepare accordingly. Post-surgery is one of those unique situations where the temporary use of laxatives can make a world of difference. Always consult with your doctor, and recognize that if you wait several days after surgery to take a laxative, the constipation might be too severe for these over-the-counter options. It is always important to discuss all medications you plan on taking with your doctor. Ask your doctor if you can start the laxative immediately after surgery. *  Here are go-to post-surgery laxatives: Senna: Senna is an herb that acts as a stimulant laxative, meaning it increases the activity of the intestine to cause you to have a bowel movement. It comes in many forms, but senna pills are easy to take and are sold over the counter at almost all pharmacies. Since opioid pain medications slow down the activity of the intestine, it makes sense to take a medication to help reverse that side effect. Long-term use of a stimulant laxative is not a good idea since it can make your  colon lazy and not function properly; however, temporary use immediately after surgery is acceptable. In general, if you are able to eat a normal diet, taking senna soon after surgery works the best. Senna usually works within hours to produce a bowel movement, but this is less predictable when you are taking different medications after surgery. Try not to wait several days to start taking senna, as often it is too late by then. Just like with all medications or supplements, check with your doctor before starting new treatment.   Magnesium: Magnesium is an important mineral that our body needs. We get magnesium from some foods that we eat, especially foods that are high in fiber such as broccoli, almonds and whole grains. There are also magnesium-based medications used to treat constipation including milk of magnesia (magnesium hydroxide), magnesium citrate and magnesium oxide. They work by  drawing water into the intestine, putting it into the class of osmotic laxatives. Magnesium products in low doses appear to be safe, but if taken in very large doses, can lead to problems such as irregular heartbeat, low blood pressure and even death. It can also affect other medications you might be taking, therefore it is important to discuss using magnesium with your physician and pharmacist before initiating therapy. Most over-the-counter magnesium laxatives work very well to help with the constipation related to surgery, but sometimes they work too well and lead to diarrhea. Make sure you are somewhere with easy access to a bathroom, just in case.   Bisacodyl: Bisacodyl (generic name) is sold under brand names such as Dulcolax. Much like senna, it is a stimulant laxative, meaning it makes your intestines move more quickly to push out the stool. This is another good choice to start taking as soon as your doctor says you can take a laxative after surgery. It comes in pill form and as a suppository, which is a good choice for people who cannot or are not allowed to swallow pills. Studies have shown that it works as a laxative, but like most of these medications, you should use this on a short-term basis only.   Enema: Enemas strike fear in many people, but FEAR NOT! Its nowhere near as big a deal as you may think. An enema is just a way to get some liquid into your rectum by placing a specially designed device through your anus. If you have never done one, it might seem like a painful, unpleasant, uncomfortable, complicated and lengthy procedure. But in reality, its simple, takes just a few seconds and is highly effective. The small ready-made bottles you buy at the pharmacy are much easier than the hose/large rubber container type. Those recommended positions illustrated in some instructions are generally not necessary to place the enema. Its very similar to the insertion of a tampon, requiring a  slight squat. Some extra lubrication on the enemas tip (or on your anus) will make it a breeze. In certain cases, there is no substitute for a good enema. For example, if someone has not pooped for a few days, the beginning of the poop waiting to come out can become rock hard. Passing that hard stool can lead to much pain and problems like anal fissures. Inserting a little liquid to break up the rock-hard stool will help make its passage much easier. Enemas come with different liquids. Most come with saline, but there are also mineral oil options. You can also use warm water in the reusable enema containers. They all work. But since saline  can sometimes be irritating, so try a mineral oil or water enema instead.  Here are commonly recommended constipation medications that do not work well for post-surgery constipation: Docusate: Docusate (generic name) most commonly referred to as Colace (brand name) is not really a laxative, but is classified as a stool softener. Although this medication is commonly prescribed, it is not recommended for several reasons: 1) there is no good medical evidence that it works 2) even if it has an effect, which is very questionable, it is minimal and cannot combat the intestinal slowing caused by the opioid medications. Skip docusate to save money and space in your pillbox for something more effective.  PEG: Miralax (brand name) is basically a chemical called polyethylene glycol (PEG) and it has gained tremendous popularity as a laxative. This product is an osmotic laxative meaning it works by pulling water into the stool, making it softer. This is very similar to the action of natural fiber in foods and supplements. Therefore, the effect seen by this medication is not immediate, causing a bowel movement in a day or more. Is this medication strong enough to battle the constipation related to having an operation? Maybe for some people not prone to constipation. But for most  people, other laxatives are better to prevent constipation after surgery.

## 2018-04-27 NOTE — Anesthesia Postprocedure Evaluation (Signed)
Anesthesia Post Note  Patient: [name]  Procedure(s) Performed: ATTEMPTED LAPAROSCOPIC HYSTERECTOMY CONVERTED TO SUPRACERVICAL ABDOMINAL HYSTERECTOMY (N/A ) SALPINGO OOPHORECTOMY (Left ) CYSTOSCOPY (N/A ) UNILATERAL SALPINGECTOMY (Right ) LAPAROSCOPIC LYSIS OF ADHESIONS, RETROPERITONEAL DISSECTION  Patient location during evaluation: PACU Anesthesia Type: General Level of consciousness: awake and alert Pain management: pain level controlled Vital Signs Assessment: post-procedure vital signs reviewed and stable Respiratory status: spontaneous breathing, nonlabored ventilation, respiratory function stable and patient connected to nasal cannula oxygen Cardiovascular status: blood pressure returned to baseline and stable Postop Assessment: no apparent nausea or vomiting Anesthetic complications: no     Last Vitals:  Vitals:   04/27/18 1655 04/27/18 1702  BP: 118/79 120/79  Pulse: (!) 114 (!) 111  Resp: (!) 22 13  Temp:  (!) 36.1 C  SpO2: 100% 100%    Last Pain:  Vitals:   04/27/18 1702  TempSrc:   PainSc: 3                  Rafaella Kole S

## 2018-04-27 NOTE — Anesthesia Procedure Notes (Signed)
Procedure Name: Intubation Date/Time: 04/27/2018 7:35 AM Performed by: Sherol DadeMacMang, Beyounce Dickens H, CRNA Pre-anesthesia Checklist: Patient identified, Emergency Drugs available, Suction available, Patient being monitored and Timeout performed Patient Re-evaluated:Patient Re-evaluated prior to induction Oxygen Delivery Method: Circle system utilized Preoxygenation: Pre-oxygenation with 100% oxygen Induction Type: IV induction and Cricoid Pressure applied Ventilation: Mask ventilation without difficulty Laryngoscope Size: Miller and 2 Grade View: Grade II Tube type: Oral Tube size: 7.5 mm Number of attempts: 1 Airway Equipment and Method: Stylet Placement Confirmation: ETT inserted through vocal cords under direct vision,  CO2 detector,  breath sounds checked- equal and bilateral and positive ETCO2 Secured at: 20 (@teeth ) cm Tube secured with: Tape Dental Injury: Teeth and Oropharynx as per pre-operative assessment

## 2018-04-27 NOTE — Interval H&P Note (Signed)
History and Physical Interval Note:  04/27/2018 7:22 AM  [name]  has presented today for surgery, with the diagnosis of Abnormal uterine bleeding - menometrorrhagia  The various methods of treatment have been discussed with the patient and family. After consideration of risks, benefits and other options for treatment, the patient has consented to  Procedure(s): HYSTERECTOMY TOTAL LAPAROSCOPIC (N/A) LAPAROSCOPIC BILATERAL SALPINGECTOMY (Bilateral) CYSTOSCOPY (N/A) as a surgical intervention .  The patient's history has been reviewed, patient examined, no change in status, stable for surgery.  I have reviewed the patient's chart and labs.  Questions were answered to the patient's satisfaction.     Christeen DouglasBethany Markeesha Char

## 2018-04-27 NOTE — Anesthesia Post-op Follow-up Note (Signed)
Anesthesia QCDR form completed.        

## 2018-04-27 NOTE — Consult Note (Signed)
Name: [name] MRN: [id number] DOB: [birth year]    ADMISSION DATE:  04/27/2018 CONSULTATION DATE: 04/27/2018  REFERRING MD : Dr. Dalbert Garnet  CHIEF COMPLAINT: s/p Hysterectomy   BRIEF PATIENT DESCRIPTION:  42 yo female admitted s/p total hysterectomy transferred to the stepdown unit for closer monitoring   SIGNIFICANT EVENTS/STUDIES:  06/7 Pt admitted to [facility] unit s/p hysterectomy  06/7 Pt transferred to stepdown unit for closer monitoring   HISTORY OF PRESENT ILLNESS:   This is a 42 yo female with a PMH of GERD, DVT, and Menometrorrhagia.  She presented to [facility] on 06/7 for a total hysterectomy and bilateral laparoscopic salpingectomy.  During procedure pt had an extensive blood loss from abherrant vessels resulting in hypotension requiring initiation of massive transfusion protocol.  She received 750 ml of albumin, 3.6L of LR, 4 units of pRBC's, 2 pools of cryo, 1 unit of platelets, and 1 unit of FFP.  Following surgery her h&h remained stable at 13.4 and 39.1.  She was admitted to [facility] unit initially postop and transferred to the stepdown unit for closer monitoring.    PAST MEDICAL HISTORY :   has a past medical history of DVT (deep venous thrombosis) (HCC) (1992) and GERD (gastroesophageal reflux disease).  has a past surgical history that includes Venous thrombectomy (4098) and Cesarean section (2000). Prior to Admission medications   Medication Sig Start Date End Date Taking? Authorizing Provider  acetaminophen (TYLENOL) 325 MG tablet Take 650 mg by mouth every 6 (six) hours as needed.    [provider]  ibuprofen (ADVIL,MOTRIN) 200 MG tablet Take 400 mg by mouth every 6 (six) hours as needed.    [provider]   No Known Allergies  FAMILY HISTORY:  family history includes Addison's disease in her mother; Diabetes in her mother; Hypertension in her mother. SOCIAL HISTORY:  reports that she has never smoked. She has never used smokeless tobacco. She reports that  she drinks alcohol. She reports that she does not use drugs.  REVIEW OF SYSTEMS: Positives in BOLD   Constitutional: Negative for fever, chills, weight loss, malaise/fatigue and diaphoresis.  HENT: Negative for hearing loss, ear pain, nosebleeds, congestion, sore throat, neck pain, tinnitus and ear discharge.   Eyes: Negative for blurred vision, double vision, photophobia, pain, discharge and redness.  Respiratory: Negative for cough, hemoptysis, sputum production, shortness of breath, wheezing and stridor.   Cardiovascular: Negative for chest pain, palpitations, orthopnea, claudication, leg swelling and PND.  Gastrointestinal: heartburn, nausea, vomiting, abdominal pain, diarrhea, constipation, blood in stool and melena.  Genitourinary: Negative for dysuria, urgency, frequency, hematuria and flank pain.  Musculoskeletal: Negative for myalgias, back pain, joint pain and falls.  Skin: Negative for itching and rash.  Neurological: Negative for dizziness, tingling, tremors, sensory change, speech change, focal weakness, seizures, loss of consciousness, weakness and headaches.  Endo/Heme/Allergies: Negative for environmental allergies and polydipsia. Does not bruise/bleed easily.  SUBJECTIVE:  c/o nausea   VITAL SIGNS: Temp:  [97 F (36.1 C)-98 F (36.7 C)] 97.8 F (36.6 C) (06/07 2058) Pulse Rate:  [72-114] 107 (06/07 2300) Resp:  [9-23] 15 (06/07 2300) BP: (103-127)/(59-81) 113/74 (06/07 2300) SpO2:  [94 %-100 %] 100 % (06/07 2300) Arterial Line BP: (133-165)/(68-80) 133/70 (06/07 1625) FiO2 (%):  [96 %] 96 % (06/07 2031)  PHYSICAL EXAMINATION: General: well developed, well nourished female, NAD  Neuro: lethargic, oriented, follows commands  HEENT: supple, no JVD  Cardiovascular: sinus tach, no R/G Lungs: clear throughout, even, non labored  Abdomen: very faint BS x4, soft, tender, non distended Musculoskeletal: normal bulk and tone, no edema  Skin: abdominal incision dressing dry  and intact unable to visualize incision   No results for input(s): NA, K, CL, CO2, BUN, CREATININE, GLUCOSE in the last 168 hours. Recent Labs  Lab 04/27/18 1344 04/27/18 1552 04/27/18 1840  HGB 12.3 12.2 13.4  HCT 36.5 35.4 39.1  WBC  --  15.7* 17.2*  PLT  --  220 215   No results found.  ASSESSMENT / PLAN: s/p Total hysterectomy  Hemorrhagic hypotension secondary to intraop massive blood loss from abherrent vessels-resolved  Postop pain  Hx: GERD and DVT P: Supplemental O2 for dyspnea and/or hypoxia  Continuous telemetry monitoring  Maintain map >65 with aggressive fluid resuscitation  LR @125  ml/hr  Trend BMP  Replace electrolytes as indicated  Monitor UOP  Trend CBC  Monitor for s/sx of bleeding and transfuse for active bleeding and/or hgb <7 VTE px:SCD's for now, per OBGYN will start lovenox 04/29/18 Dilaudid PCA for pain management  Prn phenergan and zofran for nausea/vomiting   Sonda Rumbleana Aldonia Keeven, AGNP  Pulmonary/Critical Care Pager [phone] (please enter 7 digits) PCCM Consult Pager [phone] (please enter 7 digits)

## 2018-04-27 NOTE — Anesthesia Procedure Notes (Signed)
Arterial Line Insertion Start/End6/05/2018 12:45 PM, 04/27/2018 12:58 PM Performed by: Berdine Addisonhomas, Mathai, MD, Malva CoganBeane, Catherine, CRNA, CRNA  Patient location: OR. Preanesthetic checklist: patient identified, IV checked, site marked, risks and benefits discussed, surgical consent, monitors and equipment checked, pre-op evaluation, timeout performed and anesthesia consent Right, radial was placed  Attempts: 1 Procedure performed without using ultrasound guided technique. Following insertion, dressing applied.

## 2018-04-27 NOTE — Progress Notes (Signed)
Dr Karlton Lemonkarenz called  Pt heart rate from 90's to 120 but not sustained    No new orders at present

## 2018-04-27 NOTE — Transfer of Care (Signed)
Immediate Anesthesia Transfer of Care Note  Patient: [name]  Procedure(s) Performed: ATTEMPTED LAPAROSCOPIC HYSTERECTOMY CONVERTED TO SUPRACERVICAL ABDOMINAL HYSTERECTOMY (N/A ) SALPINGO OOPHORECTOMY (Left ) CYSTOSCOPY (N/A ) UNILATERAL SALPINGECTOMY (Right ) LAPAROSCOPIC LYSIS OF ADHESIONS, RETROPERITONEAL DISSECTION  Patient Location: PACU  Anesthesia Type:General  Level of Consciousness: drowsy and patient cooperative  Airway & Oxygen Therapy: Patient Spontanous Breathing and Patient connected to face mask oxygen  Post-op Assessment: Report given to RN, Post -op Vital signs reviewed and stable and Patient moving all extremities  Post vital signs: Reviewed and stable  Last Vitals:  Vitals Value Taken Time  BP 127/81 04/27/2018  3:13 PM  Temp 36.3 C 04/27/2018  3:05 PM  Pulse 106 04/27/2018  3:14 PM  Resp 11 04/27/2018  3:14 PM  SpO2 100 % 04/27/2018  3:14 PM  Vitals shown include unvalidated device data.  Last Pain:  Vitals:   04/27/18 0614  TempSrc: Tympanic  PainSc: 0-No pain         Complications: No apparent anesthesia complications

## 2018-04-27 NOTE — Op Note (Signed)
[name] PROCEDURE DATE: 04/27/2018  PREOPERATIVE DIAGNOSIS: 1.  Menorrhagia 2. Hx of DVT  POSTOPERATIVE DIAGNOSIS:   1. Menorrhagia 2. Hx of DVT 3. Abherrent vasculature 4. Intraop massive blood loss from abherrent vessels, but not out of control.  SURGEON:   Amy DouglasBethany Whitaker Holderman, MD. ASSISTANRanae [name]: [name], M.D. ANESTHESIOLOGIST: [name], Mathai, MD Anesthesiologist: [name], Mathai, MD CRNA: [name], Catherine, CRNA; [name], Amy H, CRNA; [name], Stephanie, CRNA  OPERATION:   Laparoscopic lysis of adhesions Laparoscopic retroperitoneal disecion Decision to convert to laparotomy Left salping-oophorectomy Right salpingectomy Supracervical hysterectomy  Cystoscopy  ANESTHESIA:  General endotracheal.  INDICATIONS: The patient is a 42 y.o. F with a hx of heavy menstrual bleeding and a hx of DVT who desires definitive surgical management. On the preoperative visit, the risks, benefits, indications, and alternatives of the procedure were reviewed with the patient.  On the day of surgery, the risks of surgery were again discussed with the patient including but not limited to: bleeding which may require transfusion or reoperation; infection which may require antibiotics; injury to bowel, bladder, ureters or other surrounding organs; need for additional procedures; thromboembolic phenomenon, incisional problems and other postoperative/anesthesia complications. Written informed consent was obtained.    OPERATIVE FINDINGS: A 9 week size uterus surrounded by extensive enlarged vascularity. Her adnexa were enlarged almost to the size of the uterus, most prominent on the left, but never normal. 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 Both ureters were visible and pulsatile.  Throughout the case, she had enlarged vessels coming from her bilateral pelvic sidewalls, coming from inside the cervix itself, and all along the lateral uterine edges. At every clamp and tie  she bled from both sides, even after we had devascularized down the uterus and cervix.   ESTIMATED BLOOD LOSS: 3600 ml FLUIDS:   3600 ml of Lactated Ringers 750 ml of albumin 4 units of packed RBCs 2 pools of cryo 1 pack of platelets 1 unit Fresh Frozen Plasma  URINE OUTPUT:  600 ml of clear yellow urine. SPECIMENS:  Uterus,cervix,  bilateral fallopian tubes (and left ovary) sent to pathology COMPLICATIONS:   - convert to open to assist in vascular control - extensive blood loss, treated promptly and effectively (per her vital signs) with the massive transfusion protocol   DESCRIPTION OF PROCEDURE:  The patient received intravenous antibiotics, 40u Lovenox and had sequential compression devices applied to her lower extremities while in the preoperative area.   She was taken to the operating room and placed under general anesthesia without difficulty.The abdomen and perineum were prepped and draped in a sterile manner, and she was placed in a dorsal Lithotomy position.  A V-care uterine manipulator was placed at this time.  A Foley catheter was inserted into her bladder and attached to constant drainage. Attention was turned to the abdomen and 0.5% Marcaine infused subq. A 5mm umbilical incision was made with the scalpel.  The Optiview 5-mm trocar and sleeve were then advanced without difficulty with the laparoscope under direct visualization into the abdomen.  Omental adhesions were noted at the umbilicus, and no visualization was able to be obtained.  The abdomen was then insufflated with carbon dioxide gas and adequate pneumoperitoneum was obtained. Therefore, a 5 mm incision was made along the left supraclavicular line at Palmer's point, after assuring that an OG tube was placed.  Excellent survey of the abdomen and pelvis was visible from the site, and 5 mm port was placed in the right lower quadrant  under direct visualization.  The omental adhesions were taken down using LigaSure.  A 10 mm  port site was placed in the left lower quadrant under direct visualization.    A survey of the patient's pelvis and abdomen revealed the findings above..  The pelvis was then carefully examined.   We started along the left pelvic sidewall, and entered retroperitoneally above the round ligament.  It appeared that we were going to be able to peel the peritoneum away from the sidewall and expose the large vasculature from this area.  We carefully dissected down to the IP which was noted to be almost 2 cm in width.  The left ureter was visible and out of our surgical field.    There were thick adhesions between the uterus and the anterior pelvic wall, and these were taken down carefully, with 180 mL of backfill to the bladder to be certain that the bladder was not disturbed.  I did a cystoscopy at this point, to be certain that the bladder was out of the surgical field, and to fully visualized the ureter openings.  Both were extruding urine, and there was nothing unusual about the bladder.  Attention was returned to the abdomen, and the right side adnexa was evaluated similarly.  At this point, it was apparent that the vasculature along the left and right uterus were so convoluted and extensive, and filled the pelvis, that we were unable to see the insertion of the cervix or interpret where the uterosacral ligaments were.  At this point we made the decision to convert to open.  A 10 mm fascial site was closed using 0 Vicryl, and all 4 of the laparoscopic incision sites were closed with 4-0 Monocryl and Dermabond.  A Pfannensteil skin incision was made. This incision was taken down to the fascia using electrocautery with care given to maintain good hemostasis. However, irregular blood vessels were noted throughout the fascia, but were not bleeding heavily. The fascia was incised in the midline and the fascial incision was then extended bilaterally using electrocautery without difficulty. The fascia was  then dissected off the underlying rectus muscles using blunt and sharp dissection. The rectus muscles were split bluntly in the midline and the peritoneum entered sharply without complication.  However, in the preperitoneal fat there were extensive blood vessels, and these needed to be closed with suture, as cautery was not effective due to their caliber.  This peritoneal incision was then extended superiorly and inferiorly with care given to prevent bowel or bladder injury.  On the left side, a Maylard incision was made through the left rectus muscles, to allow visualization of the left pelvic sidewall.  Care was taken to assure hemostasis along the rectus muscles.   Attention was then turned to the pelvis. A Bookwalter retractor was placed into the incision, and the bowel was packed away with moist laparotomy sponges. The uterus at this point was noted to be mobilized and was delivered up out of the abdomen.  Taking care, a window was made in the broad ligament bilaterally, the round ligaments on each side were clamped, suture ligated with 0 Vicryl, and transected with electrocautery allowing entry into the broad ligament. Of note, all sutures used in this procedure are 0 Vicryl unless otherwise noted. On the left, the vascular bed was circumvented laterally, and it was clear that the left ovarian vessels were not easily seperatable from the rest of the vascular bed.  Therefore, the IP ligament was triply clamped, tied, and  ligated.  It was further suture tied x2 on both edges of the pedicle.    The anterior and posterior leaves of the broad ligament were separated, and the ureters were inspected to be safely away from the area of dissection bilaterally.  Adnexae were clamped on the patient's right side, cut, and doubly suture ligated. Kelly clamps were placed on the mesosalpinx of the right fallopian tube, and the fallopian tube was excised.  The pedicle was then secured with a free tie.  A similar process  was carried out on the left side, allowing for bilateral salpingectomy.      A bladder flap was then created.  The bladder was then bluntly dissected off the lower uterine segment and cervix with poor hemostasis noted, and sharply taken down from then on. The uterine arteries were not able to be skeletonized bilaterally, and we continued to clamp and cut as needed down both sides of the uterus.  Once we had passed the internal loss of the cervix, we realized that her bleeding had not abated, and there continue to be large vessels along her cervix.  The decision was made at this point to amputate the cervix, control the bleeding, and into the case with a supracervical hysterectomy.  Of note, had blood loss of about a liter and a half, we called for blood.  At blood loss of about 2 L, we called for the massive transfusion protocol to speed along blood products.  She did receive the blood products noted above, and her vital signs were stable throughout the case.  The team was excellent in affecting these interventions, and anesthesia worked well to get her the fluids and products she needed.  The middle of the cervical cuff was closed with a series of interrupted figure-of-eight sutures with care, after bovie cauterizing along the endocervical canal.  The pelvis was irrigated and hemostasis was reconfirmed at all pedicles and along the pelvic sidewall.  This assuring hemostasis took 2 hours of the case, and at the end she was noted to be hemostatic along all pedicles.    The ureters were inspected and noted to be peristalsing bilaterally.  All laparotomy sponges and instruments were removed from the abdomen.  An adhesion barrier was placed under the peritoneum, and the fascia was closed in a running fashion. The subcutaneous layer was reapproximated with 2-0 plain gut.  Sponge, lap, needle, and instrument counts were correct times two.   We injected Exparel along the peritoneal and skin lines.  We closed the  skin with Ensorb sutures.  The uterine manipulator was removed from her vagina, and a second cystoscopy noted blood in the cervix but no bladder rents or tears, and bilaterally ureteral jets.  In [name]&[name] in the middle of the case revealed her hemoglobin to be the same as at the start of the case, and we will plan for close monitoring on the floor.  Because she was extubated and all her vital signs are stable, she is not going to the ICU, but we will watch her carefully.  The patient was taken to the recovery area awake, extubated and in stable condition.  I will not give her Lovenox until 24 hours after the case, and I am holding off on Toradol and Motrin.  I did speak with her family and showed them pictures.  My plan is to send her to the vascular surgeons as an outpatient, to review what these diagnoses may mean for her in the future.

## 2018-04-28 ENCOUNTER — Encounter
Admission: RE | Disposition: A | Discharge status: Other Acute Inpatient Hospital | Payer: Self-pay | Source: Home / Self Care | Attending: Obstetrics and Gynecology

## 2018-04-28 ENCOUNTER — Inpatient Hospital Stay: Payer: BLUE CROSS/BLUE SHIELD | Admitting: Certified Registered Nurse Anesthetist

## 2018-04-28 ENCOUNTER — Inpatient Hospital Stay: Payer: BLUE CROSS/BLUE SHIELD

## 2018-04-28 DIAGNOSIS — N1330 Unspecified hydronephrosis: Secondary | ICD-10-CM

## 2018-04-28 DIAGNOSIS — I9581 Postprocedural hypotension: Secondary | ICD-10-CM

## 2018-04-28 DIAGNOSIS — Z90710 Acquired absence of both cervix and uterus: Secondary | ICD-10-CM

## 2018-04-28 DIAGNOSIS — N179 Acute kidney failure, unspecified: Secondary | ICD-10-CM

## 2018-04-28 DIAGNOSIS — R000 Tachycardia, unspecified: Secondary | ICD-10-CM

## 2018-04-28 DIAGNOSIS — R310 Gross hematuria: Secondary | ICD-10-CM

## 2018-04-28 HISTORY — PX: CYSTOSCOPY W/ URETERAL STENT PLACEMENT: SHX1429

## 2018-04-28 LAB — PREPARE PLATELET PHERESIS
UNIT DIVISION: 0
Unit division: 0

## 2018-04-28 LAB — BASIC METABOLIC PANEL
ANION GAP: 12 (ref 5–15)
Anion gap: 12 (ref 5–15)
Anion gap: 11 (ref 5–15)
Anion gap: 12 (ref 5–15)
BUN: 23 mg/dL — AB (ref 6–20)
BUN: 25 mg/dL — AB (ref 6–20)
BUN: 28 mg/dL — ABNORMAL HIGH (ref 6–20)
BUN: 28 mg/dL — ABNORMAL HIGH (ref 6–20)
CALCIUM: 7.3 mg/dL — AB (ref 8.9–10.3)
CALCIUM: 7.1 mg/dL — AB (ref 8.9–10.3)
CHLORIDE: 112 mmol/L — AB (ref 101–111)
CHLORIDE: 111 mmol/L (ref 101–111)
CO2: 15 mmol/L — AB (ref 22–32)
CO2: 13 mmol/L — ABNORMAL LOW (ref 22–32)
CO2: 16 mmol/L — ABNORMAL LOW (ref 22–32)
CO2: 26 mmol/L (ref 22–32)
CREATININE: 2.09 mg/dL — AB (ref 0.44–1.00)
CREATININE: 2.58 mg/dL — AB (ref 0.44–1.00)
Calcium: 7.5 mg/dL — ABNORMAL LOW (ref 8.9–10.3)
Calcium: 7.2 mg/dL — ABNORMAL LOW (ref 8.9–10.3)
Chloride: 111 mmol/L (ref 101–111)
Chloride: 104 mmol/L (ref 101–111)
Creatinine, Ser: 2.55 mg/dL — ABNORMAL HIGH (ref 0.44–1.00)
Creatinine, Ser: 2.47 mg/dL — ABNORMAL HIGH (ref 0.44–1.00)
GFR calc Af Amer: 33 mL/min — ABNORMAL LOW (ref >60)
GFR calc Af Amer: 26 mL/min — ABNORMAL LOW (ref >60)
GFR calc Af Amer: 27 mL/min — ABNORMAL LOW (ref >60)
GFR calc non Af Amer: 28 mL/min — ABNORMAL LOW (ref >60)
GFR calc non Af Amer: 22 mL/min — ABNORMAL LOW (ref >60)
GFR, EST AFRICAN AMERICAN: 25 mL/min — AB (ref >60)
GFR, EST NON AFRICAN AMERICAN: 23 mL/min — AB (ref >60)
GFR, EST NON AFRICAN AMERICAN: 22 mL/min — AB (ref >60)
Glucose, Bld: 158 mg/dL — ABNORMAL HIGH (ref 65–99)
Glucose, Bld: 170 mg/dL — ABNORMAL HIGH (ref 65–99)
Glucose, Bld: 194 mg/dL — ABNORMAL HIGH (ref 65–99)
Glucose, Bld: 241 mg/dL — ABNORMAL HIGH (ref 65–99)
POTASSIUM: 5.8 mmol/L — AB (ref 3.5–5.1)
Potassium: 5.7 mmol/L — ABNORMAL HIGH (ref 3.5–5.1)
Potassium: 6.2 mmol/L — ABNORMAL HIGH (ref 3.5–5.1)
Potassium: 4.2 mmol/L (ref 3.5–5.1)
SODIUM: 136 mmol/L (ref 135–145)
SODIUM: 139 mmol/L (ref 135–145)
SODIUM: 142 mmol/L (ref 135–145)
Sodium: 138 mmol/L (ref 135–145)

## 2018-04-28 LAB — BPAM PLATELET PHERESIS
BLOOD PRODUCT EXPIRATION DATE: 201906082359
Blood Product Expiration Date: 201906092359
ISSUE DATE / TIME: 201906071403
ISSUE DATE / TIME: 201906071403
UNIT TYPE AND RH: 5100
Unit Type and Rh: 9500

## 2018-04-28 LAB — BPAM FFP
Blood Product Expiration Date: 201906122359
ISSUE DATE / TIME: 201906071310
Unit Type and Rh: 5100

## 2018-04-28 LAB — PREPARE FRESH FROZEN PLASMA: Unit division: 0

## 2018-04-28 LAB — PREPARE CRYOPRECIPITATE
UNIT DIVISION: 0
Unit division: 0

## 2018-04-28 LAB — CBC
HCT: 34.9 % — ABNORMAL LOW (ref 35.0–47.0)
HEMOGLOBIN: 11.7 g/dL — AB (ref 12.0–16.0)
MCH: 30 pg (ref 26.0–34.0)
MCHC: 33.4 g/dL (ref 32.0–36.0)
MCV: 89.7 fL (ref 80.0–100.0)
PLATELETS: 145 10*3/uL — AB (ref 150–440)
RBC: 3.89 MIL/uL (ref 3.80–5.20)
RDW: 15.9 % — AB (ref 11.5–14.5)
WBC: 17.3 10*3/uL — ABNORMAL HIGH (ref 3.6–11.0)

## 2018-04-28 LAB — BPAM CRYOPRECIPITATE
Blood Product Expiration Date: 201906071800
Blood Product Expiration Date: 201906071800
ISSUE DATE / TIME: 201906071233
ISSUE DATE / TIME: 201906071233
Unit Type and Rh: 6200
Unit Type and Rh: 6200

## 2018-04-28 LAB — LACTIC ACID, PLASMA: Lactic Acid, Venous: 4.2 mmol/L (ref 0.5–1.9)

## 2018-04-28 LAB — PROCALCITONIN: Procalcitonin: 0.86 ng/mL

## 2018-04-28 SURGERY — CYSTOSCOPY, WITH RETROGRADE PYELOGRAM AND URETERAL STENT INSERTION
Anesthesia: General | Laterality: Bilateral | Wound class: Clean Contaminated

## 2018-04-28 MED ORDER — SODIUM CHLORIDE 0.9 % IV SOLN
1.0000 g | Freq: Once | INTRAVENOUS | Status: AC
  Administered 2018-04-28: 1 g via INTRAVENOUS
  Filled 2018-04-28: qty 10

## 2018-04-28 MED ORDER — FENTANYL CITRATE (PF) 100 MCG/2ML IJ SOLN
INTRAMUSCULAR | Status: AC
  Filled 2018-04-28: qty 2

## 2018-04-28 MED ORDER — SODIUM CHLORIDE 0.9 % IV BOLUS
500.0000 mL | Freq: Once | INTRAVENOUS | Status: AC
  Administered 2018-04-28: 500 mL via INTRAVENOUS

## 2018-04-28 MED ORDER — DEXTROSE 50 % IV SOLN
50.0000 mL | Freq: Once | INTRAVENOUS | Status: AC
  Administered 2018-04-28: 50 mL via INTRAVENOUS
  Filled 2018-04-28: qty 50

## 2018-04-28 MED ORDER — SODIUM POLYSTYRENE SULFONATE 15 GM/60ML PO SUSP
15.0000 g | Freq: Once | ORAL | Status: AC
Start: 2018-04-28 — End: 2018-04-28
  Administered 2018-04-28: 15 g via ORAL

## 2018-04-28 MED ORDER — PROPOFOL 10 MG/ML IV BOLUS
INTRAVENOUS | Status: DC | PRN
  Administered 2018-04-28: 100 mg via INTRAVENOUS

## 2018-04-28 MED ORDER — ROCURONIUM BROMIDE 100 MG/10ML IV SOLN
INTRAVENOUS | Status: DC | PRN
  Administered 2018-04-28: 40 mg via INTRAVENOUS
  Administered 2018-04-28: 10 mg via INTRAVENOUS

## 2018-04-28 MED ORDER — SODIUM BICARBONATE 8.4 % IV SOLN
INTRAVENOUS | Status: DC
  Administered 2018-04-28 – 2018-04-29 (×2): via INTRAVENOUS
  Filled 2018-04-28 (×5): qty 150

## 2018-04-28 MED ORDER — DEXAMETHASONE SODIUM PHOSPHATE 10 MG/ML IJ SOLN
INTRAMUSCULAR | Status: AC
  Filled 2018-04-28: qty 1

## 2018-04-28 MED ORDER — SODIUM BICARBONATE 8.4 % IV SOLN
150.0000 meq | Freq: Once | INTRAVENOUS | Status: AC
  Administered 2018-04-28: 150 meq via INTRAVENOUS
  Filled 2018-04-28 (×3): qty 150

## 2018-04-28 MED ORDER — PHENYLEPHRINE HCL 10 MG/ML IJ SOLN
INTRAMUSCULAR | Status: DC | PRN
  Administered 2018-04-28 – 2018-04-29 (×2): 100 ug via INTRAVENOUS

## 2018-04-28 MED ORDER — CEFAZOLIN SODIUM 1 G IJ SOLR
INTRAMUSCULAR | Status: AC
  Filled 2018-04-28: qty 20

## 2018-04-28 MED ORDER — PROPOFOL 10 MG/ML IV BOLUS
INTRAVENOUS | Status: AC
  Filled 2018-04-28: qty 20

## 2018-04-28 MED ORDER — FAMOTIDINE 20 MG PO TABS
20.0000 mg | ORAL_TABLET | Freq: Two times a day (BID) | ORAL | Status: DC
  Administered 2018-04-28 – 2018-04-29 (×2): 20 mg via ORAL
  Filled 2018-04-28 (×2): qty 1

## 2018-04-28 MED ORDER — FENTANYL CITRATE (PF) 100 MCG/2ML IJ SOLN
INTRAMUSCULAR | Status: DC | PRN
  Administered 2018-04-28 – 2018-04-29 (×4): 50 ug via INTRAVENOUS

## 2018-04-28 MED ORDER — SODIUM CHLORIDE 0.9 % IV BOLUS
1000.0000 mL | Freq: Once | INTRAVENOUS | Status: AC
  Administered 2018-04-28: 450 mL via INTRAVENOUS
  Administered 2018-04-29: 50 mL via INTRAVENOUS
  Administered 2018-04-29: 400 mL via INTRAVENOUS

## 2018-04-28 MED ORDER — CEFAZOLIN SODIUM-DEXTROSE 2-3 GM-%(50ML) IV SOLR
INTRAVENOUS | Status: DC | PRN
  Administered 2018-04-28: 2 g via INTRAVENOUS

## 2018-04-28 MED ORDER — SODIUM CHLORIDE FLUSH 0.9 % IV SOLN
INTRAVENOUS | Status: AC
  Filled 2018-04-28: qty 10

## 2018-04-28 MED ORDER — SODIUM CHLORIDE 0.9 % IV BOLUS
1000.0000 mL | Freq: Once | INTRAVENOUS | Status: AC
  Administered 2018-04-28: 1000 mL via INTRAVENOUS

## 2018-04-28 MED ORDER — ORAL CARE MOUTH RINSE
15.0000 mL | Freq: Two times a day (BID) | OROMUCOSAL | Status: DC
  Administered 2018-04-28 – 2018-05-06 (×10): 15 mL via OROMUCOSAL

## 2018-04-28 MED ORDER — ONDANSETRON HCL 4 MG/2ML IJ SOLN
INTRAMUSCULAR | Status: AC
  Filled 2018-04-28: qty 2

## 2018-04-28 MED ORDER — CALCIUM CHLORIDE 10 % IV SOLN: 1.0000 g | Freq: Once | INTRAVENOUS | Status: DC

## 2018-04-28 MED ORDER — SODIUM BICARBONATE 8.4 % IV SOLN
100.0000 meq | Freq: Once | INTRAVENOUS | Status: AC
  Administered 2018-04-28: 100 meq via INTRAVENOUS
  Filled 2018-04-28: qty 100

## 2018-04-28 MED ORDER — INSULIN REGULAR HUMAN 100 UNIT/ML IJ SOLN
10.0000 [IU] | Freq: Once | INTRAMUSCULAR | Status: AC
  Administered 2018-04-28: 10 [IU] via INTRAVENOUS
  Filled 2018-04-28: qty 0.1

## 2018-04-28 MED ORDER — LORAZEPAM 2 MG/ML IJ SOLN: 1.0000 mg | Freq: Once | INTRAMUSCULAR | Status: DC

## 2018-04-28 MED ORDER — ALBUTEROL SULFATE (2.5 MG/3ML) 0.083% IN NEBU: 2.5000 mg | INHALATION_SOLUTION | Freq: Once | RESPIRATORY_TRACT | Status: DC

## 2018-04-28 SURGICAL SUPPLY — 33 items
BAG DRAIN CYSTO-URO LG1000N (MISCELLANEOUS) ×6 IMPLANT
BAG URO DRAIN 2000ML W/SPOUT (MISCELLANEOUS) ×3 IMPLANT
BAG URO DRAIN 4000ML (MISCELLANEOUS) ×3 IMPLANT
BRUSH SCRUB EZ  4% CHG (MISCELLANEOUS) ×2
BRUSH SCRUB EZ 4% CHG (MISCELLANEOUS) ×1 IMPLANT
CATH FOLEY 3WAY 30CC 24FR (CATHETERS) ×2
CATH URETL 5X70 OPEN END (CATHETERS) ×3 IMPLANT
CATH URTH STD 24FR FL 3W 2 (CATHETERS) ×1 IMPLANT
CONRAY 43 FOR UROLOGY 50M (MISCELLANEOUS) ×3 IMPLANT
DRAPE UTILITY 15X26 TOWEL STRL (DRAPES) ×3 IMPLANT
ELECT COAG BIPOLAR CYL 1.2MMM (ELECTROSURGICAL) ×3
ELECTRODE COAG BIPLR CYL 1.2MM (ELECTROSURGICAL) ×1 IMPLANT
GLIDEWIRE STIFF .35X180X3 HYDR (WIRE) ×3 IMPLANT
GLIDEWIRE STR 0.035 150CM 3CM (WIRE) ×3 IMPLANT
GOWN STRL REUS W/ TWL LRG LVL3 (GOWN DISPOSABLE) ×1 IMPLANT
GOWN STRL REUS W/TWL LRG LVL3 (GOWN DISPOSABLE) ×2
INFUSOR MANOMETER BAG 3000ML (MISCELLANEOUS) ×6 IMPLANT
KIT TURNOVER CYSTO (KITS) ×3 IMPLANT
LOOP CUT BIPOLAR 24F LRG (ELECTROSURGICAL) ×3 IMPLANT
MANIFOLD NEPTUNE II (INSTRUMENTS) ×3 IMPLANT
PACK CYSTO AR (MISCELLANEOUS) ×3 IMPLANT
SENSORWIRE 0.038 NOT ANGLED (WIRE) ×3
SET CYSTO W/LG BORE CLAMP LF (SET/KITS/TRAYS/PACK) ×3 IMPLANT
SET IRRIG Y TYPE TUR BLADDER L (SET/KITS/TRAYS/PACK) ×3 IMPLANT
SOL .9 NS 3000ML IRR  AL (IV SOLUTION) ×4
SOL .9 NS 3000ML IRR UROMATIC (IV SOLUTION) ×2 IMPLANT
STENT URET 6FRX24 CONTOUR (STENTS) ×3 IMPLANT
SURGILUBE 2OZ TUBE FLIPTOP (MISCELLANEOUS) ×3 IMPLANT
SYR 10ML LL (SYRINGE) ×6 IMPLANT
SYR 30ML LL (SYRINGE) ×3 IMPLANT
SYRINGE IRR TOOMEY STRL 70CC (SYRINGE) ×3 IMPLANT
WATER STERILE IRR 1000ML POUR (IV SOLUTION) ×3 IMPLANT
WIRE SENSOR 0.038 NOT ANGLED (WIRE) ×1 IMPLANT

## 2018-04-28 NOTE — Progress Notes (Signed)
1 Day Post-Op       Procedure(s): ATTEMPTED LAPAROSCOPIC HYSTERECTOMY CONVERTED TO SUPRACERVICAL ABDOMINAL HYSTERECTOMY (N/A) SALPINGO OOPHORECTOMY (Left) CYSTOSCOPY (N/A) UNILATERAL SALPINGECTOMY (Right) LAPAROSCOPIC LYSIS OF ADHESIONS, RETROPERITONEAL DISSECTION Subjective: Pt in the ICU for monitoring. Continued tachycardia to 140s, pain controlled with morphine PCA. Belly not distended but she is consistently nauseated. Not passing gas, is burping.   GRF low at 28, BUN 23 and Cr- 2.09 - both significantly elevated  K+ high, unable to swallow kayexalate this morning IVF running Foley cath in place, urine output not adequate despite adequate hydration, dark red urine visualized.   Objective: Vital signs in last 24 hours: Temp:  [97 F (36.1 C)-98 F (36.7 C)] 97.3 F (36.3 C) (06/08 1145) Pulse Rate:  [76-132] 118 (06/08 1300) Resp:  [9-23] 18 (06/08 1300) BP: (99-135)/(59-84) 135/84 (06/08 1300) SpO2:  [94 %-100 %] 98 % (06/08 1300) Arterial Line BP: (133-165)/(68-80) 133/70 (06/07 1625) FiO2 (%):  [96 %] 96 % (06/07 2031)  Intake/Output  Intake/Output Summary (Last 24 hours) at 04/28/2018 1404 Last data filed at 04/28/2018 1146 Gross per 24 hour  Intake 3805 ml  Output 1020 ml  Net 2785 ml    Physical Exam:  General: Drowsy and oriented. GI: Soft, Nondistended. Incisions: Clean and dry, visible under honeycomb dressing Urine:  Foley in place, minimal bloody urine Extremities: Nontender, no erythema, +bilateral edema. Warm Some ecchymosis on superior mons  Lab Results: Recent Labs    04/27/18 1552 04/27/18 1840 04/28/18 1043  HGB 12.2 13.4 11.7*  HCT 35.4 39.1 34.9*  WBC 15.7* 17.2* 17.3*  PLT 220 215 145*                 Results for orders placed or performed during the hospital encounter of 04/27/18 (from the past 24 hour(s))  CBC     Status: Abnormal   Collection Time: 04/27/18  3:52 PM  Result Value Ref Range   WBC 15.7 (H) 3.6 - 11.0 K/uL   RBC  3.94 3.80 - 5.20 MIL/uL   Hemoglobin 12.2 12.0 - 16.0 g/dL   HCT 84.135.4 32.435.0 - 40.147.0 %   MCV 89.9 80.0 - 100.0 fL   MCH 31.1 26.0 - 34.0 pg   MCHC 34.6 32.0 - 36.0 g/dL   RDW 02.714.7 (H) 25.311.5 - 66.414.5 %   Platelets 220 150 - 440 K/uL  CBC     Status: Abnormal   Collection Time: 04/27/18  6:40 PM  Result Value Ref Range   WBC 17.2 (H) 3.6 - 11.0 K/uL   RBC 4.35 3.80 - 5.20 MIL/uL   Hemoglobin 13.4 12.0 - 16.0 g/dL   HCT 40.339.1 47.435.0 - 25.947.0 %   MCV 89.7 80.0 - 100.0 fL   MCH 30.8 26.0 - 34.0 pg   MCHC 34.4 32.0 - 36.0 g/dL   RDW 56.315.4 (H) 87.511.5 - 64.314.5 %   Platelets 215 150 - 440 K/uL  Glucose, capillary     Status: Abnormal   Collection Time: 04/27/18  9:51 PM  Result Value Ref Range   Glucose-Capillary 172 (H) 65 - 99 mg/dL  MRSA PCR Screening     Status: None   Collection Time: 04/27/18 10:27 PM  Result Value Ref Range   MRSA by PCR NEGATIVE NEGATIVE  Basic metabolic panel     Status: Abnormal   Collection Time: 04/28/18 10:43 AM  Result Value Ref Range   Sodium 138 135 - 145 mmol/L   Potassium 5.7 (H) 3.5 - 5.1  mmol/L   Chloride 111 101 - 111 mmol/L   CO2 15 (L) 22 - 32 mmol/L   Glucose, Bld 158 (H) 65 - 99 mg/dL   BUN 23 (H) 6 - 20 mg/dL   Creatinine, Ser 1.61 (H) 0.44 - 1.00 mg/dL   Calcium 7.3 (L) 8.9 - 10.3 mg/dL   GFR calc non Af Amer 28 (L) >60 mL/min   GFR calc Af Amer 33 (L) >60 mL/min   Anion gap 12 5 - 15  CBC     Status: Abnormal   Collection Time: 04/28/18 10:43 AM  Result Value Ref Range   WBC 17.3 (H) 3.6 - 11.0 K/uL   RBC 3.89 3.80 - 5.20 MIL/uL   Hemoglobin 11.7 (L) 12.0 - 16.0 g/dL   HCT 09.6 (L) 04.5 - 40.9 %   MCV 89.7 80.0 - 100.0 fL   MCH 30.0 26.0 - 34.0 pg   MCHC 33.4 32.0 - 36.0 g/dL   RDW 81.1 (H) 91.4 - 78.2 %   Platelets 145 (L) 150 - 440 K/uL    Assessment/Plan: 1 Day Post-Op       Procedure(s): ATTEMPTED LAPAROSCOPIC HYSTERECTOMY CONVERTED TO SUPRACERVICAL ABDOMINAL HYSTERECTOMY (N/A) SALPINGO OOPHORECTOMY (Left) CYSTOSCOPY  (N/A) UNILATERAL SALPINGECTOMY (Right) LAPAROSCOPIC LYSIS OF ADHESIONS, RETROPERITONEAL DISSECTION  Appreciate intensivist care.   1) Acute renal failure, likely due to pre-renal injury during brief hypovolemia in the OR. She is now euvolemic, and continue IV hydration. However, after her hysterectomy I did repeat cystoscopy, which was normal at the beginning of surgery. I saw bilateral clear urine from both ureters, but the bladder was filled with blood that did not entirely clear with 2 L of flushing. Because of the amount of bleeding from all vessels, including in "avascular" space, this was not surprising. However, I would have expected it to clear somewhat by now. DDx includes bladder injury not visualized during surgery or during cysto, injury to ureters, kidney. Very unlikely blood flow to kidneys damaged - f/u renal ultrasound.  - consult urology to consider cystogram - continue IVF - consider nephrology  2) Hyperkalemia:  - if NPO, treat per ICU protocol.   3) H/H stable  4) Possible ileus:  - consider KUB - NG tube with iv valium PRN - NPO for now - phenergan, Zofran IV as needed  5) Pelvic venous vascularity  - TED hose ordered - SCDs - f/u outpatient with vascular surgery   6) Hx of DVT - continue TED and SCDs - ambulation as soon as able - r/o bladder injury - hold chemical ppx for DVT until r/o bleeding in pelvis  7) Tachycardia, sinus tach. H/H stable, low urine output - continue ivf - possible pain reaction - r/o urinary injury - ICU management appreciated     LOS: 1 day   Christeen Douglas 04/28/2018, 2:04 PMPatient ID: [name], female   DOB: [birth year], 42 y.o.   MRN: [id number]

## 2018-04-28 NOTE — Progress Notes (Signed)
Pt's HR has increased and is now sustaining in the 130s-140s. Dr. Lonn Georgiaonforti made aware, he stated to obtain EKG and inform him if HR sustains above 150.

## 2018-04-28 NOTE — Consult Note (Signed)
[facility]  CONSULT NOTE    Date: 04/28/2018                  Patient Name:  [name]  MRN: [id number]  DOB: 1976/10/16  Age / Sex: 42 y.o., female         PCP: Gracelyn Nurse, MD                 Service Requesting Consult: Dr. Lonn Georgia                 Reason for Consult: Acute renal failure            History of Present Illness: [name] is a 42 y.o. white female with history of DVT, GERD, , who was admitted to [facility] on 04/27/2018 for total hysterectomy and BSO. She had extensive blood loss and then received IV contrast for CT scan today.   Patient's creatinine is elevated and therefore nephrology was consulted. Some urine output. However concern for left renal hydronephrosis.   Family at bedside   Medications: Outpatient medications: Medications Prior to Admission  Medication Sig Dispense Refill Last Dose  . acetaminophen (TYLENOL) 325 MG tablet Take 650 mg by mouth every 6 (six) hours as needed.   04/25/2018  . ibuprofen (ADVIL,MOTRIN) 200 MG tablet Take 400 mg by mouth every 6 (six) hours as needed.   04/19/2018    Current medications: Current Facility-Administered Medications  Medication Dose Route Frequency Provider Last Rate Last Dose  . diphenhydrAMINE (BENADRYL) injection 12.5 mg  12.5 mg Intravenous Q6H PRN Ward, Elenora Fender, MD       Or  . diphenhydrAMINE (BENADRYL) 12.5 MG/5ML elixir 12.5 mg  12.5 mg Oral Q6H PRN Ward, Chelsea C, MD      . docusate sodium (COLACE) capsule 100 mg  100 mg Oral BID Ward, Elenora Fender, MD   100 mg at 04/28/18 1205  . [START ON 04/29/2018] enoxaparin (LOVENOX) injection 40 mg  40 mg Subcutaneous Q24H Christeen Douglas, MD      . famotidine (PEPCID) tablet 20 mg  20 mg Oral BID Conforti, John, DO   20 mg at 04/28/18 1205  . HYDROmorphone (DILAUDID) 1 mg/mL PCA injection   Intravenous Q4H Ward, Elenora Fender, MD   25 mg at 04/27/18 1853  . lactated ringers infusion   Intravenous Continuous Ward, Elenora Fender, MD  125 mL/hr at 04/28/18 1333    . LORazepam (ATIVAN) injection 1 mg  1 mg Intravenous Once Christeen Douglas, MD   Stopped at 04/28/18 1430  . MEDLINE mouth rinse  15 mL Mouth Rinse BID Conforti, John, DO      . menthol-cetylpyridinium (CEPACOL) lozenge 3 mg  1 lozenge Oral Q2H PRN Ward, Chelsea C, MD      . naloxone [facility]) injection 0.4 mg  0.4 mg Intravenous PRN Ward, Chelsea C, MD       And  . sodium chloride flush (NS) 0.9 % injection 9 mL  9 mL Intravenous PRN Ward, Chelsea C, MD      . ondansetron (ZOFRAN) tablet 4 mg  4 mg Oral Q6H PRN Ward, Elenora Fender, MD       Or  . ondansetron (ZOFRAN) injection 4 mg  4 mg Intravenous Q6H PRN Ward, Elenora Fender, MD   4 mg at 04/28/18 0348  . ondansetron (ZOFRAN) injection 4 mg  4 mg Intravenous Q6H PRN Ward, Elenora Fender, MD   4 mg at 04/28/18 1202  .  promethazine (PHENERGAN) injection 12.5-25 mg  12.5-25 mg Intravenous Q6H PRN Eugenie Norrie, NP   25 mg at 04/28/18 1330  . simethicone (MYLICON) chewable tablet 160 mg  160 mg Oral QID PRN Ward, Elenora Fender, MD   160 mg at 04/27/18 2302      Allergies: No Known Allergies    Past Medical History: Past Medical History:  Diagnosis Date  . DVT (deep venous thrombosis) (HCC) 1992   3 blood clots after pregnancy (one in each leg and one in abdomen)  . GERD (gastroesophageal reflux disease)      Past Surgical History: Past Surgical History:  Procedure Laterality Date  . CESAREAN SECTION  2000  . VENOUS THROMBECTOMY  1992     Family History: Family History  Problem Relation Age of Onset  . Diabetes Mother   . Hypertension Mother   . Addison's disease Mother      Social History: Social History   Socioeconomic History  . Marital status: Married    Spouse name: Not on file  . Number of children: Not on file  . Years of education: Not on file  . Highest education level: Not on file  Occupational History  . Not on file  Social Needs  . Financial resource strain: Not on file  . Food  insecurity:    Worry: Not on file    Inability: Not on file  . Transportation needs:    Medical: Not on file    Non-medical: Not on file  Tobacco Use  . Smoking status: Never Smoker  . Smokeless tobacco: Never Used  Substance and Sexual Activity  . Alcohol use: Yes    Comment: occassional  . Drug use: No  . Sexual activity: Not on file  Lifestyle  . Physical activity:    Days per week: Not on file    Minutes per session: Not on file  . Stress: Not on file  Relationships  . Social connections:    Talks on phone: Not on file    Gets together: Not on file    Attends religious service: Not on file    Active member of club or organization: Not on file    Attends meetings of clubs or organizations: Not on file    Relationship status: Not on file  . Intimate partner violence:    Fear of current or ex partner: Not on file    Emotionally abused: Not on file    Physically abused: Not on file    Forced sexual activity: Not on file  Other Topics Concern  . Not on file  Social History Narrative  . Not on file     Review of Systems: Review of Systems  Unable to perform ROS: Critical illness    Vital Signs: Blood pressure 120/83, pulse (!) 131, temperature (!) 97.3 F (36.3 C), temperature source Axillary, resp. rate 15, last menstrual period 04/22/2018, SpO2 99 %.  Weight trends: There were no vitals filed for this visit.  Physical Exam: General: NAD,   Head: Normocephalic, atraumatic. Moist oral mucosal membranes  Eyes: Anicteric, PERRL  Neck: Supple, trachea midline  Lungs:  Clear to auscultation  Heart: tachycardia  Abdomen:  Soft, nontender,   Extremities:  no  peripheral edema.  Neurologic: Nonfocal, moving all four extremities  Skin: No lesions         Lab results: Basic Metabolic Panel: Recent Labs  Lab 04/28/18 1043 04/28/18 1446  NA 138 136  K 5.7* 6.2*  CL  111 112*  CO2 15* 13*  GLUCOSE 158* 170*  BUN 23* 25*  CREATININE 2.09* 2.55*  CALCIUM  7.3* 7.1*    Liver Function Tests: No results for input(s): AST, ALT, ALKPHOS, BILITOT, PROT, ALBUMIN in the last 168 hours. No results for input(s): LIPASE, AMYLASE in the last 168 hours. No results for input(s): AMMONIA in the last 168 hours.  CBC: Recent Labs  Lab 04/27/18 1552 04/27/18 1840 04/28/18 1043  WBC 15.7* 17.2* 17.3*  HGB 12.2 13.4 11.7*  HCT 35.4 39.1 34.9*  MCV 89.9 89.7 89.7  PLT 220 215 145*    Cardiac Enzymes: No results for input(s): CKTOTAL, CKMB, CKMBINDEX, TROPONINI in the last 168 hours.  BNP: Invalid input(s): POCBNP  CBG: Recent Labs  Lab 04/27/18 2151  GLUCAP 172*    Microbiology: Results for orders placed or performed during the hospital encounter of 04/27/18  MRSA PCR Screening     Status: None   Collection Time: 04/27/18 10:27 PM  Result Value Ref Range Status   MRSA by PCR NEGATIVE NEGATIVE Final    Comment:        The GeneXpert MRSA Assay (FDA approved for NASAL specimens only), is one component of a comprehensive MRSA colonization surveillance program. It is not intended to diagnose MRSA infection nor to guide or monitor treatment for MRSA infections. Performed at [facility], [address] [place], [place] [address]     Coagulation Studies: No results for input(s): LABPROT, INR in the last 72 hours.  Urinalysis: No results for input(s): COLORURINE, LABSPEC, PHURINE, GLUCOSEU, HGBUR, BILIRUBINUR, KETONESUR, PROTEINUR, UROBILINOGEN, NITRITE, LEUKOCYTESUR in the last 72 hours.  Invalid input(s): APPERANCEUR    Imaging: Dg Abd 1 View  Result Date: 04/28/2018 CLINICAL DATA:  Abdominal swelling. Patient is status post complete hysterectomy, postop day 1. EXAM: ABDOMEN - 1 VIEW COMPARISON:  None. FINDINGS: The bowel gas pattern is normal. No radio-opaque calculi or other significant radiographic abnormality are seen. Surgical clips are identified in the pelvis. IMPRESSION: No bowel obstruction noted.  Electronically Signed   By: Sherian Rein M.D.   On: 04/28/2018 15:34   US Renal  Result Date: 04/28/2018 CLINICAL DATA:  Renal failure EXAM: RENAL / URINARY TRACT ULTRASOUND COMPLETE COMPARISON:  None. FINDINGS: Right Kidney: Length: 8.8 cm. Mildly echogenic right renal parenchyma. No right hydronephrosis. No right renal mass. Left Kidney: Length: 9.6 cm. Mildly echogenic left renal parenchyma. Mild left hydronephrosis. No left renal mass. Bladder: Bladder collapsed by Foley catheter and cannot be evaluated on this scan. IMPRESSION: 1. Mild left hydronephrosis. Left ureteral obstruction cannot be excluded. Further imaging evaluation with hematuria protocol CT abdomen/pelvis without and with IV contrast may be obtained as clinically warranted. 2. No right hydronephrosis. 3. Mildly echogenic kidneys, indicative of nonspecific renal parenchymal disease of uncertain chronicity. 4. Bladder collapsed by Foley catheter and not evaluated on this scan. Electronically Signed   By: Delbert Phenix M.D.   On: 04/28/2018 14:01      Assessment & Plan: [name] is a 42 y.o. white female with history of DVT, GERD, , who was admitted to [facility] on 04/27/2018 for total hysterectomy and BSO. She had extensive blood loss and then received IV contrast for CT scan today.   1. Acute Renal Failure 2. Hyperkalemia 3. Metabolic Acidosis  Impression Secondary to ATN from hypotension and acute blood loss. Worsening by obstructive uropathy with left hydronephrosis - Shift potassium with IV bicarb, calcium  - Appreciate Urology input. Retrograde  pyelogram for later today once potassium at goal.    LOS: 1 [name] 6/8/20194:13 PM

## 2018-04-28 NOTE — Progress Notes (Signed)
Patient ID: Jonelle SidleGinger B Wesby, female   DOB: 09/22/1976, 42 y.o.   MRN: [id number] Pulmonary/Critical Care  Patient with continued bleeding and Foley and bag. Discussed with OB/GYN. Also noted elevated BUN/creatinine of potassium which is acute. Concern for possible traumatic injury. Consult with urology coming in for cystoscogram. Also consulting nephrology. Given Kayexalate with repeat potassium pending. Patient has had persistent sinus tachycardia. Stable hemodynamics at this time. KUB pending for possible ileus.  Tora KindredJohn Peace Noyes, D.O.

## 2018-04-28 NOTE — Progress Notes (Signed)
Pt's BMP came back with elevated potassium of 6.2 and worsening renal function. Dr. Lonn Georgiaonforti notified and he stated to order 1 amp of D50, 10 units of IV insulin, 1g of calcium gluconate, and 2 amps of sodium bicarb. Orders placed in epic.

## 2018-04-28 NOTE — Progress Notes (Signed)
Pt's BMP resulted with potassium still elevated at 5.8. Dr. Alvester MorinBell notified and he asked that Dr. Wynelle LinkKolluru be notified and asked to treat pt so that potassium can normalize prior to procedure. Dr. Wynelle LinkKolluru ordered to administer an additional 1G of calcium gluconate, 150meq (3 amps) of sodium bicarb, 1 amp of D50, 10units regular insulin IV, and albuterol nebulizer x 1; and, to redraw BMP once medications have been administered. Orders placed in epic. Dr. Alvester MorinBell called and again and notified of plan. Pt and family also notified.

## 2018-04-28 NOTE — Anesthesia Preprocedure Evaluation (Signed)
Anesthesia Evaluation  Patient identified by MRN, date of birth, ID band Patient awake    Reviewed: Allergy & Precautions, NPO status , Patient's Chart, lab work & pertinent test results  History of Anesthesia Complications Negative for: history of anesthetic complications  Airway Mallampati: III       Dental   Pulmonary neg sleep apnea, neg COPD,           Cardiovascular (-) hypertension(-) Past MI and (-) CHF + dysrhythmias (Tachycardia) (-) Valvular Problems/Murmurs     Neuro/Psych neg Seizures    GI/Hepatic Neg liver ROS, GERD  Medicated and Controlled,  Endo/Other  neg diabetes  Renal/GU negative Renal ROS     Musculoskeletal   Abdominal   Peds  Hematology  (+) anemia ,   Anesthesia Other Findings Pt with extremity and facial edema  Reproductive/Obstetrics                             Anesthesia Physical Anesthesia Plan  ASA: III and emergent  Anesthesia Plan: General   Post-op Pain Management:    Induction: Intravenous  PONV Risk Score and Plan: 3 and Dexamethasone, Ondansetron and Midazolam  Airway Management Planned: Oral ETT  Additional Equipment:   Intra-op Plan:   Post-operative Plan: Possible Post-op intubation/ventilation and Extubation in OR  Informed Consent: I have reviewed the patients History and Physical, chart, labs and discussed the procedure including the risks, benefits and alternatives for the proposed anesthesia with the patient or authorized representative who has indicated his/her understanding and acceptance.     Plan Discussed with:   Anesthesia Plan Comments:         Anesthesia Quick Evaluation

## 2018-04-28 NOTE — Progress Notes (Signed)
Report given to OR RN and patient left floor at 2244 with orderly and NT. Family at bedside and aware. Husband gave consent earlier. Meds administered to lower K, K now 4.2. Dr. Alvester MorinBell was notifiied, as well as NP.

## 2018-04-28 NOTE — Progress Notes (Signed)
Pt's HR has increased, urinary output has been 50mls over the last 3 hours, and BMP came back with abnormal results. Dr. Lonn Georgiaonforti notified. Verbal orders to administered 1L of NS, kayexalate, and to recollect BMP at 1600 received and placed in Epic.

## 2018-04-28 NOTE — Consult Note (Addendum)
H&P Physician requesting consult: Christeen Douglas, MD  Chief Complaint: Gross hematuria, acute renal insufficiency following abdominal hysterectomy  History of Present Illness: 42 year old female who underwent an attempted laparoscopic hysterectomy yesterday that was converted to open.  There was reportedly about 4 L of blood loss and massive transfusion protocol occurred.  Patient reportedly had gross hematuria at the end of the surgery.  Dr. Dalbert Garnet performed a cystoscopy at that time and did not see an obvious bladder injury and also saw bilateral clear reflux from each ureter.  Patient has had progressive gross hematuria today.  He also has worsening renal function.  Creatinine is 2.55 with a potassium of 6.2.  Given her hematuria, she underwent a CT cystogram which I assisted with.  I first performed a CT of the abdomen and pelvis.  CT of the abdomen revealed mild left-sided hydronephrosis.  She is unable to tolerate IV contrast due to her acute renal failure and therefore full evaluation of the ureters is not possible with CT scan alone.  CT cystogram revealed no obvious bladder injury.  The patient is somewhat somnolent.  She has required a great deal of pain medication especially for transitioning over to the CT scan table.  Hemoglobin is 11.7.  The patient is unable to give me a complete history currently because of her recent surgery and discomfort.  Past Medical History:  Diagnosis Date  . DVT (deep venous thrombosis) (HCC) 1992   3 blood clots after pregnancy (one in each leg and one in abdomen)  . GERD (gastroesophageal reflux disease)    Past Surgical History:  Procedure Laterality Date  . CESAREAN SECTION  2000  . VENOUS THROMBECTOMY  1992    Home Medications:  Medications Prior to Admission  Medication Sig Dispense Refill Last Dose  . acetaminophen (TYLENOL) 325 MG tablet Take 650 mg by mouth every 6 (six) hours as needed.   04/25/2018  . ibuprofen (ADVIL,MOTRIN) 200 MG tablet  Take 400 mg by mouth every 6 (six) hours as needed.   04/19/2018   Allergies: No Known Allergies  Family History  Problem Relation Age of Onset  . Diabetes Mother   . Hypertension Mother   . Addison's disease Mother    Social History:  reports that she has never smoked. She has never used smokeless tobacco. She reports that she drinks alcohol. She reports that she does not use drugs.  ROS: A complete review of systems was performed.  All systems are negative except for pertinent findings as noted. ROS   Physical Exam:  Vital signs in last 24 hours: Temp:  [97 F (36.1 C)-98 F (36.7 C)] 97.3 F (36.3 C) (06/08 1145) Pulse Rate:  [76-141] 131 (06/08 1542) Resp:  [9-23] 17 (06/08 1542) BP: (99-135)/(59-84) 120/83 (06/08 1500) SpO2:  [94 %-100 %] 97 % (06/08 1542) Arterial Line BP: (133-143)/(68-71) 133/70 (06/07 1625) FiO2 (%):  [96 %] 96 % (06/07 2031) General: Somnolent, appears uncomfortable secondary to pain HEENT: Normocephalic, atraumatic Neck: No JVD or lymphadenopathy Cardiovascular: Tachycardic Lungs: Regular rate and effort Abdomen: Soft, tender, mildly distended Back: No CVA tenderness GU: Foley catheter in place draining dark red urine  Laboratory Data:  Results for orders placed or performed during the hospital encounter of 04/27/18 (from the past 24 hour(s))  CBC     Status: Abnormal   Collection Time: 04/27/18  6:40 PM  Result Value Ref Range   WBC 17.2 (H) 3.6 - 11.0 K/uL   RBC 4.35 3.80 - 5.20 MIL/uL  Hemoglobin 13.4 12.0 - 16.0 g/dL   HCT 09.8 11.9 - 14.7 %   MCV 89.7 80.0 - 100.0 fL   MCH 30.8 26.0 - 34.0 pg   MCHC 34.4 32.0 - 36.0 g/dL   RDW 82.9 (H) 56.2 - 13.0 %   Platelets 215 150 - 440 K/uL  Glucose, capillary     Status: Abnormal   Collection Time: 04/27/18  9:51 PM  Result Value Ref Range   Glucose-Capillary 172 (H) 65 - 99 mg/dL  MRSA PCR Screening     Status: None   Collection Time: 04/27/18 10:27 PM  Result Value Ref Range   MRSA  by PCR NEGATIVE NEGATIVE  Basic metabolic panel     Status: Abnormal   Collection Time: 04/28/18 10:43 AM  Result Value Ref Range   Sodium 138 135 - 145 mmol/L   Potassium 5.7 (H) 3.5 - 5.1 mmol/L   Chloride 111 101 - 111 mmol/L   CO2 15 (L) 22 - 32 mmol/L   Glucose, Bld 158 (H) 65 - 99 mg/dL   BUN 23 (H) 6 - 20 mg/dL   Creatinine, Ser 8.65 (H) 0.44 - 1.00 mg/dL   Calcium 7.3 (L) 8.9 - 10.3 mg/dL   GFR calc non Af Amer 28 (L) >60 mL/min   GFR calc Af Amer 33 (L) >60 mL/min   Anion gap 12 5 - 15  CBC     Status: Abnormal   Collection Time: 04/28/18 10:43 AM  Result Value Ref Range   WBC 17.3 (H) 3.6 - 11.0 K/uL   RBC 3.89 3.80 - 5.20 MIL/uL   Hemoglobin 11.7 (L) 12.0 - 16.0 g/dL   HCT 78.4 (L) 69.6 - 29.5 %   MCV 89.7 80.0 - 100.0 fL   MCH 30.0 26.0 - 34.0 pg   MCHC 33.4 32.0 - 36.0 g/dL   RDW 28.4 (H) 13.2 - 44.0 %   Platelets 145 (L) 150 - 440 K/uL  Basic metabolic panel     Status: Abnormal   Collection Time: 04/28/18  2:46 PM  Result Value Ref Range   Sodium 136 135 - 145 mmol/L   Potassium 6.2 (H) 3.5 - 5.1 mmol/L   Chloride 112 (H) 101 - 111 mmol/L   CO2 13 (L) 22 - 32 mmol/L   Glucose, Bld 170 (H) 65 - 99 mg/dL   BUN 25 (H) 6 - 20 mg/dL   Creatinine, Ser 1.02 (H) 0.44 - 1.00 mg/dL   Calcium 7.1 (L) 8.9 - 10.3 mg/dL   GFR calc non Af Amer 22 (L) >60 mL/min   GFR calc Af Amer 26 (L) >60 mL/min   Anion gap 11 5 - 15   Recent Results (from the past 240 hour(s))  MRSA PCR Screening     Status: None   Collection Time: 04/27/18 10:27 PM  Result Value Ref Range Status   MRSA by PCR NEGATIVE NEGATIVE Final    Comment:        The GeneXpert MRSA Assay (FDA approved for NASAL specimens only), is one component of a comprehensive MRSA colonization surveillance program. It is not intended to diagnose MRSA infection nor to guide or monitor treatment for MRSA infections. Performed at [facility], [address] [place], [place] [address]     Creatinine: Recent Labs    04/28/18 1043 04/28/18 1446  CREATININE 2.09* 2.55*   CT cystogram and CT of the abdomen and pelvis personally reviewed and listed above.  I assisted with instillation  of contrast into the bladder for the CT cystogram and personally supervised the procedure.  Impression/Assessment:  Gross hematuria post hysterectomy Left hydronephrosis Acute renal insufficiency Hyperkalemia  Plan:  Bladder injury was ruled out.  To rule out ureteral injury, recommend cystoscopy with bilateral retrograde pyelogram with possible bilateral ureteral stent placement she does have hydronephrosis on the left so I will likely leave a stent on the left even if there is no evidence of injury.  May just be postinflammatory in nature.  I will also consent her for possible exploratory laparotomy with bilateral ureteral repair in the event that there is a complete injury they require open reimplant.  Unfortunately, she has hyperkalemia and so we will need to wait until this is treated to avoid arrhythmia.  Ray ChurchEugene D Bell, III 04/28/2018, 3:53 PM

## 2018-04-29 ENCOUNTER — Encounter: Payer: Self-pay | Admitting: Urology

## 2018-04-29 ENCOUNTER — Inpatient Hospital Stay: Payer: BLUE CROSS/BLUE SHIELD

## 2018-04-29 ENCOUNTER — Other Ambulatory Visit: Payer: Self-pay

## 2018-04-29 DIAGNOSIS — N179 Acute kidney failure, unspecified: Secondary | ICD-10-CM

## 2018-04-29 DIAGNOSIS — R310 Gross hematuria: Secondary | ICD-10-CM

## 2018-04-29 DIAGNOSIS — N1330 Unspecified hydronephrosis: Secondary | ICD-10-CM

## 2018-04-29 LAB — CBC WITH DIFFERENTIAL/PLATELET
BASOS ABS: 0 10*3/uL (ref 0–0.1)
Basophils Relative: 0 %
EOS PCT: 0 %
Eosinophils Absolute: 0 10*3/uL (ref 0–0.7)
HCT: 31.1 % — ABNORMAL LOW (ref 35.0–47.0)
HEMOGLOBIN: 10.6 g/dL — AB (ref 12.0–16.0)
LYMPHS ABS: 0.6 10*3/uL — AB (ref 1.0–3.6)
LYMPHS PCT: 4 %
MCH: 30.8 pg (ref 26.0–34.0)
MCHC: 34.1 g/dL (ref 32.0–36.0)
MCV: 90.2 fL (ref 80.0–100.0)
Monocytes Absolute: 1 10*3/uL — ABNORMAL HIGH (ref 0.2–0.9)
Monocytes Relative: 6 %
NEUTROS PCT: 90 %
Neutro Abs: 14.5 10*3/uL — ABNORMAL HIGH (ref 1.4–6.5)
PLATELETS: 105 10*3/uL — AB (ref 150–440)
RBC: 3.45 MIL/uL — AB (ref 3.80–5.20)
RDW: 15.3 % — ABNORMAL HIGH (ref 11.5–14.5)
WBC: 16.1 10*3/uL — AB (ref 3.6–11.0)

## 2018-04-29 LAB — CBC
HCT: 30.4 % — ABNORMAL LOW (ref 35.0–47.0)
Hemoglobin: 10.5 g/dL — ABNORMAL LOW (ref 12.0–16.0)
MCH: 30.6 pg (ref 26.0–34.0)
MCHC: 34.4 g/dL (ref 32.0–36.0)
MCV: 89 fL (ref 80.0–100.0)
PLATELETS: 87 10*3/uL — AB (ref 150–440)
RBC: 3.42 MIL/uL — ABNORMAL LOW (ref 3.80–5.20)
RDW: 15 % — AB (ref 11.5–14.5)
WBC: 18.3 10*3/uL — ABNORMAL HIGH (ref 3.6–11.0)

## 2018-04-29 LAB — COMPREHENSIVE METABOLIC PANEL
ALT: 15 U/L (ref 14–54)
AST: 35 U/L (ref 15–41)
Albumin: 3 g/dL — ABNORMAL LOW (ref 3.5–5.0)
Alkaline Phosphatase: 62 U/L (ref 38–126)
Anion gap: 13 (ref 5–15)
BUN: 36 mg/dL — ABNORMAL HIGH (ref 6–20)
CHLORIDE: 96 mmol/L — AB (ref 101–111)
CO2: 24 mmol/L (ref 22–32)
Calcium: 6.5 mg/dL — ABNORMAL LOW (ref 8.9–10.3)
Creatinine, Ser: 2.9 mg/dL — ABNORMAL HIGH (ref 0.44–1.00)
GFR, EST AFRICAN AMERICAN: 22 mL/min — AB (ref >60)
GFR, EST NON AFRICAN AMERICAN: 19 mL/min — AB (ref >60)
Glucose, Bld: 165 mg/dL — ABNORMAL HIGH (ref 65–99)
POTASSIUM: 4.2 mmol/L (ref 3.5–5.1)
Sodium: 133 mmol/L — ABNORMAL LOW (ref 135–145)
Total Bilirubin: 0.7 mg/dL (ref 0.3–1.2)
Total Protein: 5.6 g/dL — ABNORMAL LOW (ref 6.5–8.1)

## 2018-04-29 LAB — BASIC METABOLIC PANEL
ANION GAP: 11 (ref 5–15)
BUN: 31 mg/dL — AB (ref 6–20)
CHLORIDE: 106 mmol/L (ref 101–111)
CO2: 24 mmol/L (ref 22–32)
Calcium: 6.6 mg/dL — ABNORMAL LOW (ref 8.9–10.3)
Creatinine, Ser: 2.44 mg/dL — ABNORMAL HIGH (ref 0.44–1.00)
GFR calc Af Amer: 27 mL/min — ABNORMAL LOW (ref >60)
GFR, EST NON AFRICAN AMERICAN: 23 mL/min — AB (ref >60)
Glucose, Bld: 196 mg/dL — ABNORMAL HIGH (ref 65–99)
POTASSIUM: 4 mmol/L (ref 3.5–5.1)
Sodium: 141 mmol/L (ref 135–145)

## 2018-04-29 LAB — TROPONIN I: TROPONIN I: 0.29 ng/mL — AB (ref <0.03)

## 2018-04-29 LAB — LACTIC ACID, PLASMA
LACTIC ACID, VENOUS: 2.5 mmol/L — AB (ref 0.5–1.9)
LACTIC ACID, VENOUS: 1.8 mmol/L (ref 0.5–1.9)

## 2018-04-29 LAB — AMYLASE: Amylase: 53 U/L (ref 28–100)

## 2018-04-29 LAB — PROCALCITONIN
PROCALCITONIN: 0.78 ng/mL
Procalcitonin: 0.93 ng/mL

## 2018-04-29 LAB — PHOSPHORUS: Phosphorus: 4.2 mg/dL (ref 2.5–4.6)

## 2018-04-29 LAB — LIPASE, BLOOD: LIPASE: 20 U/L (ref 11–51)

## 2018-04-29 LAB — MAGNESIUM: MAGNESIUM: 1.4 mg/dL — AB (ref 1.7–2.4)

## 2018-04-29 MED ORDER — LIDOCAINE HCL 2 % IJ SOLN
5.0000 mL | Freq: Once | INTRAMUSCULAR | Status: AC
  Administered 2018-04-29: 100 mg via INTRADERMAL
  Filled 2018-04-29: qty 5

## 2018-04-29 MED ORDER — FENTANYL CITRATE (PF) 100 MCG/2ML IJ SOLN: 25.0000 ug | INTRAMUSCULAR | Status: DC | PRN

## 2018-04-29 MED ORDER — METOPROLOL TARTRATE 5 MG/5ML IV SOLN
2.5000 mg | Freq: Once | INTRAVENOUS | Status: AC
  Administered 2018-04-29: 2.5 mg via INTRAVENOUS
  Filled 2018-04-29: qty 5

## 2018-04-29 MED ORDER — PROMETHAZINE HCL 25 MG/ML IJ SOLN
12.5000 mg | Freq: Four times a day (QID) | INTRAMUSCULAR | Status: DC | PRN
  Administered 2018-04-30 (×2): 25 mg via INTRAVENOUS
  Administered 2018-04-30: 12.5 mg via INTRAVENOUS
  Administered 2018-04-30 – 2018-05-03 (×7): 25 mg via INTRAVENOUS
  Filled 2018-04-29 (×11): qty 1

## 2018-04-29 MED ORDER — HYDROMORPHONE HCL 1 MG/ML IJ SOLN
0.5000 mg | Freq: Once | INTRAMUSCULAR | Status: AC
  Administered 2018-04-29: 0.5 mg via INTRAVENOUS
  Filled 2018-04-29 (×2): qty 1

## 2018-04-29 MED ORDER — SUGAMMADEX SODIUM 200 MG/2ML IV SOLN
INTRAVENOUS | Status: AC
  Filled 2018-04-29: qty 2

## 2018-04-29 MED ORDER — SODIUM CHLORIDE 0.9% FLUSH
10.0000 mL | Freq: Two times a day (BID) | INTRAVENOUS | Status: DC
  Administered 2018-04-29: 30 mL
  Administered 2018-04-29 – 2018-04-30 (×2): 10 mL
  Administered 2018-04-30: 30 mL
  Administered 2018-05-01 – 2018-05-04 (×7): 10 mL
  Administered 2018-05-04: 30 mL
  Administered 2018-05-05: 10 mL
  Administered 2018-05-05 – 2018-05-06 (×3): 30 mL
  Administered 2018-05-07 – 2018-05-10 (×7): 10 mL
  Administered 2018-05-10: 30 mL
  Administered 2018-05-11 – 2018-05-13 (×6): 10 mL
  Administered 2018-05-14: 30 mL
  Administered 2018-05-14: 10 mL
  Administered 2018-05-15: 30 mL
  Administered 2018-05-15: 20 mL
  Administered 2018-05-16: 10 mL
  Administered 2018-05-16: 20 mL
  Administered 2018-05-17 – 2018-05-19 (×4): 10 mL

## 2018-04-29 MED ORDER — OXYCODONE-ACETAMINOPHEN 5-325 MG PO TABS: 1.0000 | ORAL_TABLET | Freq: Four times a day (QID) | ORAL | Status: DC | PRN

## 2018-04-29 MED ORDER — SUGAMMADEX SODIUM 200 MG/2ML IV SOLN
INTRAVENOUS | Status: DC | PRN
  Administered 2018-04-29: 200 mg via INTRAVENOUS

## 2018-04-29 MED ORDER — HYDROMORPHONE HCL 1 MG/ML IJ SOLN: 1.0000 mg | Freq: Once | INTRAMUSCULAR | Status: DC

## 2018-04-29 MED ORDER — SODIUM CHLORIDE 0.9% FLUSH
10.0000 mL | INTRAVENOUS | Status: DC | PRN
  Administered 2018-05-09: 10 mL
  Administered 2018-05-11: 20 mL
  Administered 2018-05-18: 10 mL
  Filled 2018-04-29 (×3): qty 40

## 2018-04-29 MED ORDER — SEVOFLURANE IN SOLN
RESPIRATORY_TRACT | Status: AC
  Filled 2018-04-29: qty 250

## 2018-04-29 MED ORDER — METOPROLOL TARTRATE 5 MG/5ML IV SOLN
5.0000 mg | Freq: Once | INTRAVENOUS | Status: AC
  Administered 2018-04-29: 5 mg via INTRAVENOUS
  Filled 2018-04-29: qty 5

## 2018-04-29 MED ORDER — ACETAMINOPHEN 325 MG PO TABS: 650.0000 mg | ORAL_TABLET | Freq: Four times a day (QID) | ORAL | Status: DC

## 2018-04-29 MED ORDER — HYDROMORPHONE HCL 1 MG/ML IJ SOLN
1.0000 mg | INTRAMUSCULAR | Status: DC | PRN
  Administered 2018-04-29 – 2018-05-02 (×8): 1 mg via INTRAVENOUS
  Filled 2018-04-29 (×9): qty 1

## 2018-04-29 MED ORDER — ACETAMINOPHEN 500 MG PO TABS
1000.0000 mg | ORAL_TABLET | Freq: Three times a day (TID) | ORAL | Status: AC
  Administered 2018-04-29: 1000 mg via ORAL
  Filled 2018-04-29: qty 2

## 2018-04-29 MED ORDER — ONDANSETRON HCL 4 MG/2ML IJ SOLN: 4.0000 mg | Freq: Once | INTRAMUSCULAR | Status: DC | PRN

## 2018-04-29 MED ORDER — PIPERACILLIN-TAZOBACTAM 3.375 G IVPB
3.3750 g | Freq: Three times a day (TID) | INTRAVENOUS | Status: DC
  Administered 2018-04-29 – 2018-04-30 (×4): 3.375 g via INTRAVENOUS
  Filled 2018-04-29 (×4): qty 50

## 2018-04-29 MED ORDER — OXYBUTYNIN CHLORIDE 5 MG PO TABS
5.0000 mg | ORAL_TABLET | Freq: Three times a day (TID) | ORAL | Status: DC
  Administered 2018-04-29 – 2018-04-30 (×2): 5 mg via ORAL
  Filled 2018-04-29 (×6): qty 1

## 2018-04-29 MED ORDER — OXYCODONE HCL 5 MG PO TABS
5.0000 mg | ORAL_TABLET | ORAL | Status: DC | PRN
  Administered 2018-04-29: 5 mg via ORAL
  Filled 2018-04-29: qty 1

## 2018-04-29 MED ORDER — METHYLENE BLUE 0.5 % INJ SOLN
INTRAVENOUS | Status: DC | PRN
  Administered 2018-04-29: 5 mL via INTRAVENOUS

## 2018-04-29 MED ORDER — METHYLENE BLUE 0.5 % INJ SOLN
INTRAVENOUS | Status: AC
  Filled 2018-04-29: qty 10

## 2018-04-29 MED ORDER — HYDROMORPHONE HCL 1 MG/ML IJ SOLN: 0.5000 mg | Freq: Once | INTRAMUSCULAR | Status: DC

## 2018-04-29 NOTE — Progress Notes (Signed)
PT Cancellation Note  Patient Details Name: [name] MRN: [id number] DOB: 08/29/1976   Cancelled Treatment:    Reason Eval/Treat Not Completed: Medical issues which prohibited therapy(per chart review, pt's HR in the 130s).  Will hold PT until pt more medically appropriate for exertional activity.    Encarnacion ChuAshley Erinn Huskins PT, DPT 04/29/2018, 1:35 PM

## 2018-04-29 NOTE — Progress Notes (Signed)
Pt's HR is sustaining in the 130s which is an increase from earlier today. Rhythm appears to be sinus tachycardia per monitor. She reports current pain level as a 5/10. PO pain medication administered and Dr. Lonn Georgiaonforti notified. No new orders received at this time.

## 2018-04-29 NOTE — Anesthesia Post-op Follow-up Note (Signed)
Anesthesia QCDR form completed.        

## 2018-04-29 NOTE — Progress Notes (Addendum)
Patient left arm looks better, swelling down with ice applied to area. NP aware. Central line was placed due to limited access. Lactic acid down to 2.5, NP aware. Family at bedside. Patient is alert and oriented and Dilaudid PCA pump restarted once central line placed and verified by xray. K remains WNL at 4.0. Tachycardia on monitor, had 2.5 mg IV metoprolol this AM.

## 2018-04-29 NOTE — Progress Notes (Addendum)
Pt stating that she "has to go pee". Three-way foley in place, but CBI stopped early today per Dr. Alvester MorinBell. Urine is dark red. No clots seen in foley collection bag. Pt's HR has also increased to >150 and has sustained for 5 minutes. Dr. Lonn Georgiaonforti notified and he stated to administer 5mg  of IV lopressor once, now. Dr. Alvester MorinBell notified about about pt feeling as though she has to void and the color of urine. He stated to perform bladder scan, irrigate foley once, and restart CBI.   Update 1840: Bladder scan attempted, but incision and dressing is placed suprapubically. Therefore, bladder is not visualized on bladder scanner. Dr. Alvester MorinBell aware that bladder scanner was unable to visualize urine in bladder due to dressing placement. Foley catheter irrigation performed and multiple small clots were removed from foley. CBI restarted. Per Dr. Alvester MorinBell CBI may continue through the night. HR now in the 110s.

## 2018-04-29 NOTE — Progress Notes (Signed)
Urology Inpatient Progress Report  Abnormal uterine bleeding - menometrorrhagia  Procedure(s): CYSTOSCOPY WITH RETROGRADE PYELOGRAM/Left URETERAL STENT PLACEMENT, ureteroscopy  1 Day Post-Op   Intv/Subj: Patient was some pain around her bladder.  Continuous bladder irrigation on a light drip draining light pink urine.  Creatinine a little bit improved today.  No hyperkalemia today.  She was started on Zosyn due to concern for possible sepsis given her elevated lactate and persistent tachycardia.  Lactate is improved.  She does remain tachycardic in the 130s.  Active Problems:   S/P abdominal hysterectomy  Current Facility-Administered Medications  Medication Dose Route Frequency Provider Last Rate Last Dose  . acetaminophen (TYLENOL) tablet 1,000 mg  1,000 mg Oral Q8H Christeen Douglas, MD   1,000 mg at 04/29/18 1344  . albuterol (PROVENTIL) (2.5 MG/3ML) 0.083% nebulizer solution 2.5 mg  2.5 mg Nebulization Once Ray Church III, MD      . docusate sodium (COLACE) capsule 100 mg  100 mg Oral BID Ray Church III, MD   100 mg at 04/29/18 1020  . enoxaparin (LOVENOX) injection 40 mg  40 mg Subcutaneous Q24H Crista Elliot, MD   Stopped at 04/29/18 [phone]  . famotidine (PEPCID) tablet 20 mg  20 mg Oral BID Ray Church III, MD   20 mg at 04/29/18 1020  . HYDROmorphone (DILAUDID) injection 1 mg  1 mg Intravenous Q2H PRN Christeen Douglas, MD      . LORazepam (ATIVAN) injection 1 mg  1 mg Intravenous Once Crista Elliot, MD   Stopped at 04/28/18 1430  . MEDLINE mouth rinse  15 mL Mouth Rinse BID Ray Church III, MD   15 mL at 04/29/18 1029  . menthol-cetylpyridinium (CEPACOL) lozenge 3 mg  1 lozenge Oral Q2H PRN Ray Church III, MD      . ondansetron [facility]) tablet 4 mg  4 mg Oral Q6H PRN Ray Church III, MD       Or  . ondansetron [facility]) injection 4 mg  4 mg Intravenous Q6H PRN Ray Church III, MD   4 mg at 04/29/18 1227  . oxyCODONE (Oxy IR/ROXICODONE) immediate  release tablet 5 mg  5 mg Oral Q4H PRN Christeen Douglas, MD   5 mg at 04/29/18 1222  . piperacillin-tazobactam (ZOSYN) IVPB 3.375 g  3.375 g Intravenous Q8H Christeen Douglas, MD 12.5 mL/hr at 04/29/18 1346 3.375 g at 04/29/18 1346  . promethazine (PHENERGAN) injection 12.5-25 mg  12.5-25 mg Intravenous Q6H PRN Ray Church III, MD   25 mg at 04/28/18 1330  . simethicone (MYLICON) chewable tablet 160 mg  160 mg Oral QID PRN Ray Church III, MD   160 mg at 04/27/18 2302  . sodium chloride flush (NS) 0.9 % injection 10-40 mL  10-40 mL Intracatheter Q12H Eugenie Norrie, NP   30 mL at 04/29/18 1031  . sodium chloride flush (NS) 0.9 % injection 10-40 mL  10-40 mL Intracatheter PRN Eugenie Norrie, NP         Objective: Vital: Vitals:   04/29/18 1300 04/29/18 1308 04/29/18 1400 04/29/18 1500  BP: (!) 141/110 (!) 139/96 112/77 (!) 144/99  Pulse: (!) 134 (!) 134 (!) 128 (!) 131  Resp: (!) 21 (!) 22 19 18   Temp: (!) 97.4 F (36.3 C)     TempSrc: Oral     SpO2: 98% 100% 98% 98%   I/Os: I/O last 3 completed shifts: In: 13617.5 [I.V.:3101; Other:9000; IV  Piggyback:1516.5] Out: 16109 [Urine:10150; Blood:50]  Physical Exam:  General: Patient is in no apparent distress Lungs: Normal respiratory effort, chest expands symmetrically. GI:the abdomen is soft and tender without mass.  Widely distended Foley: Three-way Foley catheter in place with light drip of CBI draining light pink urine Ext: Some edema in the extremities likely secondary to some third spacing  Lab Results: Recent Labs    04/27/18 1840 04/28/18 1043 04/29/18 0503  WBC 17.2* 17.3* 16.1*  HGB 13.4 11.7* 10.6*  HCT 39.1 34.9* 31.1*   Recent Labs    04/28/18 1822 04/28/18 2144 04/29/18 0503  NA 139 142 141  K 5.8* 4.2 4.0  CL 111 104 106  CO2 16* 26 24  GLUCOSE 194* 241* 196*  BUN 28* 28* 31*  CREATININE 2.47* 2.58* 2.44*  CALCIUM 7.5* 7.2* 6.6*   No results for input(s): LABPT, INR in the last 72 hours. No  results for input(s): LABURIN in the last 72 hours. Results for orders placed or performed during the hospital encounter of 04/27/18  MRSA PCR Screening     Status: None   Collection Time: 04/27/18 10:27 PM  Result Value Ref Range Status   MRSA by PCR NEGATIVE NEGATIVE Final    Comment:        The GeneXpert MRSA Assay (FDA approved for NASAL specimens only), is one component of a comprehensive MRSA colonization surveillance program. It is not intended to diagnose MRSA infection nor to guide or monitor treatment for MRSA infections. Performed at [facility], [address] [place], [place] [address]   CULTURE, BLOOD (ROUTINE X 2) w Reflex to ID Panel     Status: None (Preliminary result)   Collection Time: 04/28/18  9:24 PM  Result Value Ref Range Status   Specimen Description BLOOD RIGHT HAND  Final   Special Requests   Final    BOTTLES DRAWN AEROBIC AND ANAEROBIC Blood Culture adequate volume   Culture   Final    NO GROWTH < 12 HOURS Performed at [facility], [address] [place], [place] [address]    Report Status PENDING  Incomplete  CULTURE, BLOOD (ROUTINE X 2) w Reflex to ID Panel     Status: None (Preliminary result)   Collection Time: 04/28/18  9:44 PM  Result Value Ref Range Status   Specimen Description BLOOD RIGHT ANTECUBITAL  Final   Special Requests   Final    BOTTLES DRAWN AEROBIC AND ANAEROBIC Blood Culture adequate volume   Culture   Final    NO GROWTH < 12 HOURS Performed at [facility], [address] [place], [place] [address]    Report Status PENDING  Incomplete    Studies/Results: Ct Abdomen Pelvis Wo Contrast  Result Date: 04/28/2018 CLINICAL DATA:  Gross hematuria. One day postop from hysterectomy. Renal insufficiency. EXAM: CT ABDOMEN AND PELVIS WITHOUT CONTRAST TECHNIQUE: Multidetector CT imaging of the abdomen and pelvis was performed following the standard protocol without IV contrast. CT cystogram was  performed using 225 mL Cysto-Conray by drip infusion. COMPARISON:  None. FINDINGS: Lower chest: Mild bibasilar atelectasis. Hepatobiliary: No mass visualized on this unenhanced exam. Gallbladder is unremarkable. Pancreas: No mass or inflammatory process visualized on this unenhanced exam. Spleen:  Within normal limits in size. Adrenals/Urinary tract: No evidence of urolithiasis. Mild to moderate left hydroureteronephrosis is seen. A Foley catheter is seen within the urinary bladder. No contrast extravasation from the urinary bladder is seen on full bladder or post drain images. Stomach/Bowel:  Mild postop ileus noted. Vascular/Lymphatic: No pathologically enlarged lymph nodes identified. 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 No evidence of abdominal aortic aneurysm. Reproductive: Postop changes are seen from recent hysterectomy. Small hematoma is seen within the hysterectomy bed measuring approximately 5.1 x 4.9 cm. No other pelvic or retroperitoneal hematoma seen. Other:  None. Musculoskeletal:  No suspicious bone lesions identified. IMPRESSION: Small approximately 5 cm postop hematoma in the hysterectomy bed. No evidence of bladder injury. Mild to moderate left hydroureteronephrosis, which is of uncertain etiology. Ureteral injury cannot be excluded on this unenhanced exam. Consider retrograde pyelogram for further evaluation. Large bilateral pelvic varices. These findings were discussed by phone with Dr. Alvester Morin on 04/28/2018 immediately after exam completion at 1515 hours. Electronically Signed   By: Myles Rosenthal M.D.   On: 04/28/2018 17:06   Dg Abd 1 View  Result Date: 04/28/2018 CLINICAL DATA:  Abdominal swelling. Patient is status post complete hysterectomy, postop day 1. EXAM: ABDOMEN - 1 VIEW COMPARISON:  None. FINDINGS: The bowel gas pattern is normal. No radio-opaque calculi or other significant radiographic abnormality are seen. Surgical clips are identified in the pelvis. IMPRESSION:  No bowel obstruction noted. Electronically Signed   By: Sherian Rein M.D.   On: 04/28/2018 15:34   US Renal  Result Date: 04/28/2018 CLINICAL DATA:  Renal failure EXAM: RENAL / URINARY TRACT ULTRASOUND COMPLETE COMPARISON:  None. FINDINGS: Right Kidney: Length: 8.8 cm. Mildly echogenic right renal parenchyma. No right hydronephrosis. No right renal mass. Left Kidney: Length: 9.6 cm. Mildly echogenic left renal parenchyma. Mild left hydronephrosis. No left renal mass. Bladder: Bladder collapsed by Foley catheter and cannot be evaluated on this scan. IMPRESSION: 1. Mild left hydronephrosis. Left ureteral obstruction cannot be excluded. Further imaging evaluation with hematuria protocol CT abdomen/pelvis without and with IV contrast may be obtained as clinically warranted. 2. No right hydronephrosis. 3. Mildly echogenic kidneys, indicative of nonspecific renal parenchymal disease of uncertain chronicity. 4. Bladder collapsed by Foley catheter and not evaluated on this scan. Electronically Signed   By: Delbert Phenix M.D.   On: 04/28/2018 14:01   Dg Chest Port 1 View  Result Date: 04/29/2018 CLINICAL DATA:  Central line placement. EXAM: PORTABLE CHEST 1 VIEW COMPARISON:  None. FINDINGS: The lungs are hypoexpanded. Mild left basilar atelectasis is noted. No pleural effusion or pneumothorax is seen. A likely calcified granuloma is noted at the right midlung zone. The cardiomediastinal silhouette is borderline normal in size. No acute osseous abnormalities are identified. The patient's left IJ line is noted ending about the distal SVC. IMPRESSION: 1. Left IJ line noted ending about the distal SVC. No pneumothorax. 2. Lungs hypoexpanded, with mild left basilar atelectasis. Electronically Signed   By: Roanna Raider M.D.   On: 04/29/2018 05:28   Dg C-arm 1-60 Min  Result Date: 04/29/2018 CLINICAL DATA:  Left retrograde pyelogram and left ureteral stent placement EXAM: DG C-ARM 61-120 MIN COMPARISON:  04/28/2018 CT  abdomen/pelvis FINDINGS: Fluoroscopy time 0 minutes 23 seconds. Multiple spot fluoroscopic intraoperative radiographs of left urinary tract demonstrate mild left hydroureteronephrosis with placement of a left nephroureteral stent with the proximal pigtail portion overlying the central left renal collecting system. No filling defects are demonstrated within the visualized left urinary tract. IMPRESSION: Intraoperative fluoroscopic guidance for left retrograde pyelogram and left nephroureteral stent placement. Electronically Signed   By: Delbert Phenix M.D.   On: 04/29/2018 07:20    Assessment: Gross hematuria, likely secondary to traumatic manipulation of the bladder  during the surgery. Left hydronephrosis, likely secondary to inflammation and edema.  There is no evidence of ureteral injury in the left.  Procedure(s): CYSTOSCOPY WITH RETROGRADE PYELOGRAM/Left URETERAL STENT PLACEMENT, ureteroscopy, 1 Day Post-Op  doing well.  Plan: I stopped the continuous bladder irrigation.  I suspect she will continue to have some mild hematuria given the diffuse edema and cystitis.  As long as the catheter drains well, we will leave this off.  I would leave the catheter in until she is more mobile.  In terms of the ureteral stent, there was no evidence of injury.  I would leave this in for about 3 to 4 weeks and once she has recovered some, the ureteral edema and inflammation should be resolved and  the stent can be removed in clinic.  Of note, I was unable to access the right ureter during the surgery.  I did see  efflux of urine from this side.  This in my opinion appeared clear when it effluxed but I could not tell definitively due to the amount of inflammation and the presence of blood during the surgery.  I would recommend just observation of this for now.  If she ever develops right-sided hydronephrosis, she would need a nephrostomy tube with antegrade nephrostogram and possible antegrade ureteral stent  placement.  I added oxybutynin 5 mg 3 times daily to help with bladder spasms.   Modena SlaterEugene Ladarrian Asencio, MD Urology 04/29/2018, 3:04 PM

## 2018-04-29 NOTE — Progress Notes (Signed)
Patient returned to room around 0255. Left arm swollen and warm to touch with one blister. NP notified. Patient complaining of pain, was not on PCA during surgery. NP ordered one time order to be given IV.

## 2018-04-29 NOTE — Anesthesia Postprocedure Evaluation (Signed)
Anesthesia Post Note  Patient: [name]  Procedure(s) Performed: CYSTOSCOPY WITH RETROGRADE PYELOGRAM/Left URETERAL STENT PLACEMENT, ureteroscopy (Bilateral )  Patient location during evaluation: PACU Anesthesia Type: General Level of consciousness: awake and alert Pain management: pain level controlled Vital Signs Assessment: post-procedure vital signs reviewed and stable Respiratory status: spontaneous breathing and respiratory function stable Cardiovascular status: stable Anesthetic complications: no     Last Vitals:  Vitals:   04/29/18 0228 04/29/18 0231  BP:    Pulse: (!) 124 (!) 119  Resp: 13 15  Temp:  36.9 C  SpO2: 99% 99%    Last Pain:  Vitals:   04/29/18 0231  TempSrc:   PainSc: 0-No pain                 KEPHART,WILLIAM K

## 2018-04-29 NOTE — Progress Notes (Signed)
Patient ID: [name], female   DOB: 06/03/1976, 42 y.o.   MRN: [id number]  [facility] [place] [facility] Medicine Progess Note    SYNOPSIS   42 yo female with a PMH of GERD, DVT, and Menometrorrhagia, S/P total hysterectomy and bilateral laparoscopic salpingectomy. During procedure pt had an extensive blood loss from abherrant vessels resulting in hypotension requiring initiation of massive transfusion protocol. She received 750 ml of albumin, 3.6L of LR, 4 units of pRBC's, 2 pools of cryo, 1 unit of platelets, and 1 unit of FFP.   ASSESSMENT/PLAN   Status post abdominal hysterectomy complication seen include significant bleeding, acute renal failure, hyperkalemia, left hydronephrosis. Patient significantly improved this morning. BUN/creatinine are decreasing and potassium is now under control. Has a three-way Foley with irrigation noted. Status post left ureteral stent. We'll continue to monitor in intensive care unit, begin clear liquids and progress as tolerated  VENTILATOR SETTINGS: FiO2 (%):  [97 %] 97 %  INTAKE / OUTPUT:  Intake/Output Summary (Last 24 hours) at 04/29/2018 0828 Last data filed at 04/29/2018 0817 Gross per 24 hour  Intake 11168.1 ml  Output 40981 ml  Net -456.9 ml    Name: [name] MRN: [id number] DOB: 1976/05/13    ADMISSION DATE:  04/27/2018   SUBJECTIVE: patient had difficulty yesterday, blood noted in urine along with persistent tachycardia and new renal failure. Cystogram revealed patent bladder, ultrasound revealed left hydronephrosis, status post exploration and left stent placed. Patient doing much better this morning.hyperkalemia has resolved. BUNs is 31/creatinine 2.44 which is improved and decreasing from yesterday   VITAL SIGNS: Temp:  [96.9 F (36.1 C)-98.6 F (37 C)] 96.9 F (36.1 C) (06/09 0817) Pulse Rate:  [107-145] 108 (06/09 0800) Resp:  [11-22] 22 (06/09 0827) BP: (109-142)/(68-104) 142/85 (06/09 0800) SpO2:  [95 %-100 %] 100 %  (06/09 0827) FiO2 (%):  [97 %] 97 % (06/08 2028)   PHYSICAL EXAMINATION: Physical Examination:   VS: BP (!) 142/85   Pulse (!) 108   Temp (!) 96.9 F (36.1 C) (Axillary) Comment: Warm blankets applied  Resp (!) 22   LMP 04/22/2018   SpO2 100%   General Appearance: No distress  Neuro:without focal findings, mental status normal. HEENT: PERRLA, EOM intact. Pulmonary: normal breath sounds   CardiovascularNormal S1,S2.  No m/r/g.   Abdomen: Benign, Soft, non-tender. Renal:  No costovertebral tenderness  GU:  Not performed at this time. Endocrine: No evident thyromegaly. Skin:   warm, no rashes, no ecchymosis  Extremities: normal, no cyanosis, clubbing.    LABORATORY PANEL:   CBC Recent Labs  Lab 04/29/18 0503  WBC 16.1*  HGB 10.6*  HCT 31.1*  PLT 105*    Chemistries  Recent Labs  Lab 04/29/18 0503  NA 141  K 4.0  CL 106  CO2 24  GLUCOSE 196*  BUN 31*  CREATININE 2.44*  CALCIUM 6.6*    Recent Labs  Lab 04/27/18 2151  GLUCAP 172*   No results for input(s): PHART, PCO2ART, PO2ART in the last 168 hours. No results for input(s): AST, ALT, ALKPHOS, BILITOT, ALBUMIN in the last 168 hours.  Cardiac Enzymes No results for input(s): TROPONINI in the last 168 hours.  RADIOLOGY:  Ct Abdomen Pelvis Wo Contrast  Result Date: 04/28/2018 CLINICAL DATA:  Gross hematuria. One day postop from hysterectomy. Renal insufficiency. EXAM: CT ABDOMEN AND PELVIS WITHOUT CONTRAST TECHNIQUE: Multidetector CT imaging of the abdomen and pelvis was performed following the standard protocol without IV contrast. CT cystogram was  performed using 225 mL Cysto-Conray by drip infusion. COMPARISON:  None. FINDINGS: Lower chest: Mild bibasilar atelectasis. Hepatobiliary: No mass visualized on this unenhanced exam. Gallbladder is unremarkable. Pancreas: No mass or inflammatory process visualized on this unenhanced exam. Spleen:  Within normal limits in size. Adrenals/Urinary tract: No evidence  of urolithiasis. Mild to moderate left hydroureteronephrosis is seen. A Foley catheter is seen within the urinary bladder. No contrast extravasation from the urinary bladder is seen on full bladder or post drain images. Stomach/Bowel: Mild postop ileus noted. Vascular/Lymphatic: No pathologically enlarged lymph nodes identified. 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 No evidence of abdominal aortic aneurysm. Reproductive: Postop changes are seen from recent hysterectomy. Small hematoma is seen within the hysterectomy bed measuring approximately 5.1 x 4.9 cm. No other pelvic or retroperitoneal hematoma seen. Other:  None. Musculoskeletal:  No suspicious bone lesions identified. IMPRESSION: Small approximately 5 cm postop hematoma in the hysterectomy bed. No evidence of bladder injury. Mild to moderate left hydroureteronephrosis, which is of uncertain etiology. Ureteral injury cannot be excluded on this unenhanced exam. Consider retrograde pyelogram for further evaluation. Large bilateral pelvic varices. These findings were discussed by phone with Dr. Alvester MorinBell on 04/28/2018 immediately after exam completion at 1515 hours. Electronically Signed   By: Myles RosenthalJohn  Stahl M.D.   On: 04/28/2018 17:06   Dg Abd 1 View  Result Date: 04/28/2018 CLINICAL DATA:  Abdominal swelling. Patient is status post complete hysterectomy, postop day 1. EXAM: ABDOMEN - 1 VIEW COMPARISON:  None. FINDINGS: The bowel gas pattern is normal. No radio-opaque calculi or other significant radiographic abnormality are seen. Surgical clips are identified in the pelvis. IMPRESSION: No bowel obstruction noted. Electronically Signed   By: Sherian ReinWei-Chen  Lin M.D.   On: 04/28/2018 15:34   [place] Renal  Result Date: 04/28/2018 CLINICAL DATA:  Renal failure EXAM: RENAL / URINARY TRACT ULTRASOUND COMPLETE COMPARISON:  None. FINDINGS: Right Kidney: Length: 8.8 cm. Mildly echogenic right renal parenchyma. No right hydronephrosis. No right renal mass. Left  Kidney: Length: 9.6 cm. Mildly echogenic left renal parenchyma. Mild left hydronephrosis. No left renal mass. Bladder: Bladder collapsed by Foley catheter and cannot be evaluated on this scan. IMPRESSION: 1. Mild left hydronephrosis. Left ureteral obstruction cannot be excluded. Further imaging evaluation with hematuria protocol CT abdomen/pelvis without and with IV contrast may be obtained as clinically warranted. 2. No right hydronephrosis. 3. Mildly echogenic kidneys, indicative of nonspecific renal parenchymal disease of uncertain chronicity. 4. Bladder collapsed by Foley catheter and not evaluated on this scan. Electronically Signed   By: Delbert PhenixJason A Poff M.D.   On: 04/28/2018 14:01   Dg Chest Port 1 View  Result Date: 04/29/2018 CLINICAL DATA:  Central line placement. EXAM: PORTABLE CHEST 1 VIEW COMPARISON:  None. FINDINGS: The lungs are hypoexpanded. Mild left basilar atelectasis is noted. No pleural effusion or pneumothorax is seen. A likely calcified granuloma is noted at the right midlung zone. The cardiomediastinal silhouette is borderline normal in size. No acute osseous abnormalities are identified. The patient's left IJ line is noted ending about the distal SVC. IMPRESSION: 1. Left IJ line noted ending about the distal SVC. No pneumothorax. 2. Lungs hypoexpanded, with mild left basilar atelectasis. Electronically Signed   By: Roanna RaiderJeffery  Chang M.D.   On: 04/29/2018 05:28   Dg C-arm 1-60 Min  Result Date: 04/29/2018 CLINICAL DATA:  Left retrograde pyelogram and left ureteral stent placement EXAM: DG C-ARM 61-120 MIN COMPARISON:  04/28/2018 CT abdomen/pelvis FINDINGS: Fluoroscopy time 0 minutes 23  seconds. Multiple spot fluoroscopic intraoperative radiographs of left urinary tract demonstrate mild left hydroureteronephrosis with placement of a left nephroureteral stent with the proximal pigtail portion overlying the central left renal collecting system. No filling defects are demonstrated within the  visualized left urinary tract. IMPRESSION: Intraoperative fluoroscopic guidance for left retrograde pyelogram and left nephroureteral stent placement. Electronically Signed   By: Delbert Phenix M.D.   On: 04/29/2018 07:20     Tora Kindred, DO  04/29/2018

## 2018-04-29 NOTE — Anesthesia Procedure Notes (Signed)
Procedure Name: Intubation Date/Time: 04/28/2018 11:19 PM Performed by: Jonna Clark, CRNA Pre-anesthesia Checklist: Patient identified, Patient being monitored, Timeout performed, Emergency Drugs available and Suction available Patient Re-evaluated:Patient Re-evaluated prior to induction Oxygen Delivery Method: Circle system utilized Preoxygenation: Pre-oxygenation with 100% oxygen Induction Type: IV induction Ventilation: Mask ventilation without difficulty Laryngoscope Size: Mac and 3 Grade View: Grade I Tube type: Oral Tube size: 7.0 mm Number of attempts: 1 Placement Confirmation: ETT inserted through vocal cords under direct vision,  positive ETCO2 and breath sounds checked- equal and bilateral Secured at: 21 cm Tube secured with: Tape Dental Injury: Teeth and Oropharynx as per pre-operative assessment

## 2018-04-29 NOTE — Progress Notes (Addendum)
I spoke to Dr. Dalbert GarnetBeasley regarding pain management during rounds. She stated that the PCA could be discontinued and she would order PRN medications and scheduled tylenol. PRN percocet was ordered, but scheduled tylenol was not. I called her to ask if she still wanted the [facility] She stated to order 650mg  PO tylenol Q 6 hours x 48 hours. In addition, she asked that I also add 1mg  diluadid IV for severe pain Q 2 hours PRN. Orders added to epic. After discussion I noted that this would result in potential administration of 4.2G of acetaminophen in 24 hours period. I called Dr. Dalbert GarnetBeasley back and she stated to change the orders so that the patient received 1000mg  PO of acetaminophen q 8 hours scheduled for 48 hours, oxycodone 5mg  PO Q 4 hours PRN (moderate to severe pain), and to keep the PRN IV dilaudid as ordered earlier. Orders updated in epic and Dr. Lonn Georgiaonforti also notified of changes in pain medications and agreeable with plan.

## 2018-04-29 NOTE — Progress Notes (Signed)
Pt returned from OR s/p cystoscopy with retrograde pyelogram/left ureteral stent placement, and 3-way foley catheter placement for CBI currently draining blood tinged urine.  She is currently lethargic, follows commands, sinus tach on cardiac monitor hr 124, and vss.  Lungs clear throughout, transverse abdominal incision honeycomb dressing intact with small amount of dry serosanguinous drainage present. She is complaining of abdominal pain, 0.5 mg iv dilaudid ordered x1 dose.  2+ left upper extremity edema with erythma present secondary to iv infiltration, ice pack applied by RN and extremity elevated.  CBC and BMP pending will continue to monitor and assess pt.  Sonda Rumbleana Shandie Bertz, AGNP  Pulmonary/Critical Care Pager [phone] (please enter 7 digits) PCCM Consult Pager [phone] (please enter 7 digits)

## 2018-04-29 NOTE — Progress Notes (Addendum)
1 Day Post-Op       Procedure(s): CYSTOSCOPY WITH RETROGRADE PYELOGRAM/Left URETERAL STENT PLACEMENT, ureteroscopy (Bilateral) Subjective: Pt still in ICU, doing better. Brighter, pain more controlled. Pain from Foley >pain at incision. Family at bedside, good support.  Her hyperkalemia is improving with intervention.  Last night to the OR with Dr. Alvester MorinBell from urology for cystoscopy and left pyelogram and left ureteral stent placement for investigation of bladder and ureters after continued low urine output and bloody urine with pelvic pain. Previously, pt did have CT scan without contrast because of acute renal insufficiency to evaluate bladder, and no bladder injury noted. 5cm hematoma in pelvis noted on CT scan, which I would expect given the surgery. She has a left A-line in place. She has a three-way foley that is irrigating her bladder.  She has seen nephrology, who recommended D/C iv fluids due to fluid overload. Creatinine improved to 2.44 (highest 2.58 at 2200 last night)  She remains tachycardic in sinus rhythm but improved from 140s to 1-teens since yesterday.  Her dilaudid PCA is bolus only, and she only needs it every couple of hours.   Her Lovenox is still held and has not been given since surgery.  She was started on Zosyn for clinical dx of sepsis with persistent tachycardia, elevated lactic acid now 2.5 (4.2) and elevated WBC to 16.1 (down from 17.3 just postop) , 3.375g q8hrs.  She did tolerate a clear diet this morning. Her nausea has improved significantly, though still not passing flatus.   Her H/H remains  Objective: Vital signs in last 24 hours: Temp:  [96.9 F (36.1 C)-98.6 F (37 C)] 96.9 F (36.1 C) (06/09 0817) Pulse Rate:  [107-145] 108 (06/09 0800) Resp:  [11-22] 22 (06/09 0827) BP: (109-142)/(68-104) 142/85 (06/09 0800) SpO2:  [95 %-100 %] 100 % (06/09 0827) FiO2 (%):  [97 %] 97 % (06/08 2028)  Intake/Output  Intake/Output Summary (Last 24 hours) at  04/29/2018 0953 Last data filed at 04/29/2018 0817 Gross per 24 hour  Intake 10667.1 ml  Output 4010211475 ml  Net -807.9 ml    Physical Exam:  General: Alert and oriented, appears ill. Smiling in response today. CV: Regular rhythm, no murmurs, increased rate Lungs: Clear bilaterally. Decreased breath sounds in bilateral bases, L>R GI: Soft, mildly-distended >than yesterday. Ecchymosis over mons deepening, and lap incisions clean and dry. Minimal bowel sounds.  Incisions: Clean and dry, honeycomb dressing in place. Urine: Pink, Foley in place Extremities: Nontender, no erythema, significant edema in all extremities, with a bola on left lateral arm at site of prior IV  Lab Results: Recent Labs    04/27/18 1840 04/28/18 1043 04/29/18 0503  HGB 13.4 11.7* 10.6*  HCT 39.1 34.9* 31.1*  WBC 17.2* 17.3* 16.1*  PLT 215 145* 105*                 Results for orders placed or performed during the hospital encounter of 04/27/18 (from the past 24 hour(s))  Basic metabolic panel     Status: Abnormal   Collection Time: 04/28/18 10:43 AM  Result Value Ref Range   Sodium 138 135 - 145 mmol/L   Potassium 5.7 (H) 3.5 - 5.1 mmol/L   Chloride 111 101 - 111 mmol/L   CO2 15 (L) 22 - 32 mmol/L   Glucose, Bld 158 (H) 65 - 99 mg/dL   BUN 23 (H) 6 - 20 mg/dL   Creatinine, Ser 7.252.09 (H) 0.44 - 1.00 mg/dL   Calcium 7.3 (  L) 8.9 - 10.3 mg/dL   GFR calc non Af Amer 28 (L) >60 mL/min   GFR calc Af Amer 33 (L) >60 mL/min   Anion gap 12 5 - 15  CBC     Status: Abnormal   Collection Time: 04/28/18 10:43 AM  Result Value Ref Range   WBC 17.3 (H) 3.6 - 11.0 K/uL   RBC 3.89 3.80 - 5.20 MIL/uL   Hemoglobin 11.7 (L) 12.0 - 16.0 g/dL   HCT 04.5 (L) 40.9 - 81.1 %   MCV 89.7 80.0 - 100.0 fL   MCH 30.0 26.0 - 34.0 pg   MCHC 33.4 32.0 - 36.0 g/dL   RDW 91.4 (H) 78.2 - 95.6 %   Platelets 145 (L) 150 - 440 K/uL  Basic metabolic panel     Status: Abnormal   Collection Time: 04/28/18  2:46 PM  Result Value Ref Range    Sodium 136 135 - 145 mmol/L   Potassium 6.2 (H) 3.5 - 5.1 mmol/L   Chloride 112 (H) 101 - 111 mmol/L   CO2 13 (L) 22 - 32 mmol/L   Glucose, Bld 170 (H) 65 - 99 mg/dL   BUN 25 (H) 6 - 20 mg/dL   Creatinine, Ser 2.13 (H) 0.44 - 1.00 mg/dL   Calcium 7.1 (L) 8.9 - 10.3 mg/dL   GFR calc non Af Amer 22 (L) >60 mL/min   GFR calc Af Amer 26 (L) >60 mL/min   Anion gap 11 5 - 15  Basic metabolic panel     Status: Abnormal   Collection Time: 04/28/18  6:22 PM  Result Value Ref Range   Sodium 139 135 - 145 mmol/L   Potassium 5.8 (H) 3.5 - 5.1 mmol/L   Chloride 111 101 - 111 mmol/L   CO2 16 (L) 22 - 32 mmol/L   Glucose, Bld 194 (H) 65 - 99 mg/dL   BUN 28 (H) 6 - 20 mg/dL   Creatinine, Ser 0.86 (H) 0.44 - 1.00 mg/dL   Calcium 7.5 (L) 8.9 - 10.3 mg/dL   GFR calc non Af Amer 23 (L) >60 mL/min   GFR calc Af Amer 27 (L) >60 mL/min   Anion gap 12 5 - 15  Lactic acid, plasma     Status: Abnormal   Collection Time: 04/28/18  9:06 PM  Result Value Ref Range   Lactic Acid, Venous 4.2 (HH) 0.5 - 1.9 mmol/L  CULTURE, BLOOD (ROUTINE X 2) w Reflex to ID Panel     Status: None (Preliminary result)   Collection Time: 04/28/18  9:24 PM  Result Value Ref Range   Specimen Description BLOOD RIGHT HAND    Special Requests      BOTTLES DRAWN AEROBIC AND ANAEROBIC Blood Culture adequate volume   Culture      NO GROWTH < 12 HOURS Performed at [facility], [address] [place], [place] [address]    Report Status PENDING   Procalcitonin - Baseline     Status: None   Collection Time: 04/28/18  9:44 PM  Result Value Ref Range   Procalcitonin 0.86 ng/mL  CULTURE, BLOOD (ROUTINE X 2) w Reflex to ID Panel     Status: None (Preliminary result)   Collection Time: 04/28/18  9:44 PM  Result Value Ref Range   Specimen Description BLOOD RIGHT ANTECUBITAL    Special Requests      BOTTLES DRAWN AEROBIC AND ANAEROBIC Blood Culture adequate volume   Culture      NO  GROWTH < 12 HOURS Performed at  [facility], [address] [place] [place], [place] [address]    Report Status PENDING   Basic metabolic panel     Status: Abnormal   Collection Time: 04/28/18  9:44 PM  Result Value Ref Range   Sodium 142 135 - 145 mmol/L   Potassium 4.2 3.5 - 5.1 mmol/L   Chloride 104 101 - 111 mmol/L   CO2 26 22 - 32 mmol/L   Glucose, Bld 241 (H) 65 - 99 mg/dL   BUN 28 (H) 6 - 20 mg/dL   Creatinine, Ser 6.04 (H) 0.44 - 1.00 mg/dL   Calcium 7.2 (L) 8.9 - 10.3 mg/dL   GFR calc non Af Amer 22 (L) >60 mL/min   GFR calc Af Amer 25 (L) >60 mL/min   Anion gap 12 5 - 15  Procalcitonin     Status: None   Collection Time: 04/29/18  5:03 AM  Result Value Ref Range   Procalcitonin 0.78 ng/mL  Lactic acid, plasma     Status: Abnormal   Collection Time: 04/29/18  5:03 AM  Result Value Ref Range   Lactic Acid, Venous 2.5 (HH) 0.5 - 1.9 mmol/L  CBC with Differential/Platelet     Status: Abnormal   Collection Time: 04/29/18  5:03 AM  Result Value Ref Range   WBC 16.1 (H) 3.6 - 11.0 K/uL   RBC 3.45 (L) 3.80 - 5.20 MIL/uL   Hemoglobin 10.6 (L) 12.0 - 16.0 g/dL   HCT 54.0 (L) 98.1 - 19.1 %   MCV 90.2 80.0 - 100.0 fL   MCH 30.8 26.0 - 34.0 pg   MCHC 34.1 32.0 - 36.0 g/dL   RDW 47.8 (H) 29.5 - 62.1 %   Platelets 105 (L) 150 - 440 K/uL   Neutrophils Relative % 90 %   Neutro Abs 14.5 (H) 1.4 - 6.5 K/uL   Lymphocytes Relative 4 %   Lymphs Abs 0.6 (L) 1.0 - 3.6 K/uL   Monocytes Relative 6 %   Monocytes Absolute 1.0 (H) 0.2 - 0.9 K/uL   Eosinophils Relative 0 %   Eosinophils Absolute 0.0 0 - 0.7 K/uL   Basophils Relative 0 %   Basophils Absolute 0.0 0 - 0.1 K/uL  Basic metabolic panel     Status: Abnormal   Collection Time: 04/29/18  5:03 AM  Result Value Ref Range   Sodium 141 135 - 145 mmol/L   Potassium 4.0 3.5 - 5.1 mmol/L   Chloride 106 101 - 111 mmol/L   CO2 24 22 - 32 mmol/L   Glucose, Bld 196 (H) 65 - 99 mg/dL   BUN 31 (H) 6 - 20 mg/dL   Creatinine, Ser 3.08 (H) 0.44 - 1.00 mg/dL    Calcium 6.6 (L) 8.9 - 10.3 mg/dL   GFR calc non Af Amer 23 (L) >60 mL/min   GFR calc Af Amer 27 (L) >60 mL/min   Anion gap 11 5 - 15    Assessment/Plan: 1 Day Post-Op       Procedure(s): CYSTOSCOPY WITH RETROGRADE PYELOGRAM/Left URETERAL STENT PLACEMENT, ureteroscopy (Bilateral)   Acute renal insufficiency - no NSAIDs Hyperkalemia - resolving Leukocytosis - expected. On zosyn. Hematuria - foley in place.  On PCA - transition to po meds for ambulation if tolerated Tachycardia - improving. Euvolemic.  Acute blood loss anemia, s/p massive transfusion protocol - stable Third-spacing fluid in extremities - no lasix   She appears to be improving. She is interested in sitting, and  I encourage it. I will continue to monitor for ileus but am hopeful that she will begin to tolerate po as is improving so far. DVT is still a risk, but will hold off on lovenox while still monitoring bleeding. Likely restart anticoagulation tomorrow if nothing else changes. Pulm edema and resultant pneumonia still possible, and will encourage ambulation.  1) When tolerated recommend out of bed to chair and Ambulate. Encourage Incentive spirometry.  - PT consult placed.  2) Advance diet as tolerated, slowly 3) Appreciate urology and nephrology assistance.  4) Appreciate intensivist involvement for monitoring and treatment.    Christeen Douglas, MD   LOS: 2 days   Christeen Douglas 04/29/2018, 9:53 AM

## 2018-04-29 NOTE — Progress Notes (Signed)
Pharmacy Antibiotic Note  [name] is a 42 y.o. female admitted on 04/27/2018 with sepsis.  Pharmacy has been consulted for zosyn dosing.  Plan: Zosyn 3.375g IV q8h (4 hour infusion).     Temp (24hrs), Avg:97.8 F (36.6 C), Min:97 F (36.1 C), Max:98.6 F (37 C)  Recent Labs  Lab 04/27/18 1552 04/27/18 1840 04/28/18 1043 04/28/18 1446 04/28/18 1822 04/28/18 2106 04/28/18 2144  WBC 15.7* 17.2* 17.3*  --   --   --   --   CREATININE  --   --  2.09* 2.55* 2.47*  --  2.58*  LATICACIDVEN  --   --   --   --   --  4.2*  --     Estimated Creatinine Clearance: 32 mL/min (A) (by C-G formula based on SCr of 2.58 mg/dL (H)).    No Known Allergies   Thank you for allowing pharmacy to be a part of this patient's care.  Thomasene Rippleavid Lynnett Langlinais, PharmD, BCPS Clinical Pharmacist /04/29/2018

## 2018-04-29 NOTE — Progress Notes (Signed)
[facility]  ROUNDING NOTE   Subjective:   Left ureteral stent placed last night by Dr. Alvester MorinBell.   CBI   Patient more alert and awake.   Infiltrated IV. Now with LIJ central line  Sodium bicarb at 17300mL/hr  Creatinine 2.44 (2.58)  Objective:  Vital signs in last 24 hours:  Temp:  [96.9 F (36.1 C)-98.6 F (37 C)] 96.9 F (36.1 C) (06/09 0817) Pulse Rate:  [107-145] 108 (06/09 0800) Resp:  [11-22] 22 (06/09 0827) BP: (109-142)/(68-104) 142/85 (06/09 0800) SpO2:  [95 %-100 %] 100 % (06/09 0827) FiO2 (%):  [97 %] 97 % (06/08 2028)  Weight change:  There were no vitals filed for this visit.  Intake/Output: I/O last 3 completed shifts: In: 13617.5 [I.V.:3101; Other:9000; IV Piggyback:1516.5] Out: 1308610200 [Urine:10150; Blood:50]   Intake/Output this shift:  Total I/O In: 250.6 [I.V.:200.6; IV Piggyback:50] Out: 1425 [Urine:1425]  Physical Exam: General: Ill appearing  Head: Normocephalic, atraumatic. Moist oral mucosal membranes  Eyes: Anicteric, PERRL  Neck: Left IJ central line, trachea midline  Lungs:  Clear to auscultation  Heart: Regular rate and rhythm  Abdomen:  Soft, nontender  Extremities:  1+ peripheral edema.  Neurologic: Nonfocal, moving all four extremities  Skin: No lesions  GU: foley    Basic Metabolic Panel: Recent Labs  Lab 04/28/18 1043 04/28/18 1446 04/28/18 1822 04/28/18 2144 04/29/18 0503  NA 138 136 139 142 141  K 5.7* 6.2* 5.8* 4.2 4.0  CL 111 112* 111 104 106  CO2 15* 13* 16* 26 24  GLUCOSE 158* 170* 194* 241* 196*  BUN 23* 25* 28* 28* 31*  CREATININE 2.09* 2.55* 2.47* 2.58* 2.44*  CALCIUM 7.3* 7.1* 7.5* 7.2* 6.6*    Liver Function Tests: No results for input(s): AST, ALT, ALKPHOS, BILITOT, PROT, ALBUMIN in the last 168 hours. No results for input(s): LIPASE, AMYLASE in the last 168 hours. No results for input(s): AMMONIA in the last 168 hours.  CBC: Recent Labs  Lab 04/27/18 1344 04/27/18 1552  04/27/18 1840 04/28/18 1043 04/29/18 0503  WBC  --  15.7* 17.2* 17.3* 16.1*  NEUTROABS  --   --   --   --  14.5*  HGB 12.3 12.2 13.4 11.7* 10.6*  HCT 36.5 35.4 39.1 34.9* 31.1*  MCV  --  89.9 89.7 89.7 90.2  PLT  --  220 215 145* 105*    Cardiac Enzymes: No results for input(s): CKTOTAL, CKMB, CKMBINDEX, TROPONINI in the last 168 hours.  BNP: Invalid input(s): POCBNP  CBG: Recent Labs  Lab 04/27/18 2151  GLUCAP 172*    Microbiology: Results for orders placed or performed during the hospital encounter of 04/27/18  MRSA PCR Screening     Status: None   Collection Time: 04/27/18 10:27 PM  Result Value Ref Range Status   MRSA by PCR NEGATIVE NEGATIVE Final    Comment:        The GeneXpert MRSA Assay (FDA approved for NASAL specimens only), is one component of a comprehensive MRSA colonization surveillance program. It is not intended to diagnose MRSA infection nor to guide or monitor treatment for MRSA infections. Performed at [facility], [address] [place], [place] [address]   CULTURE, BLOOD (ROUTINE X 2) w Reflex to ID Panel     Status: None (Preliminary result)   Collection Time: 04/28/18  9:24 PM  Result Value Ref Range Status   Specimen Description BLOOD RIGHT HAND  Final   Special Requests   Final  BOTTLES DRAWN AEROBIC AND ANAEROBIC Blood Culture adequate volume   Culture   Final    NO GROWTH < 12 HOURS Performed at [facility], [address] [place], [place] [address]    Report Status PENDING  Incomplete  CULTURE, BLOOD (ROUTINE X 2) w Reflex to ID Panel     Status: None (Preliminary result)   Collection Time: 04/28/18  9:44 PM  Result Value Ref Range Status   Specimen Description BLOOD RIGHT ANTECUBITAL  Final   Special Requests   Final    BOTTLES DRAWN AEROBIC AND ANAEROBIC Blood Culture adequate volume   Culture   Final    NO GROWTH < 12 HOURS Performed at [facility], [address] [place], [place]  [address]    Report Status PENDING  Incomplete    Coagulation Studies: No results for input(s): LABPROT, INR in the last 72 hours.  Urinalysis: No results for input(s): COLORURINE, LABSPEC, PHURINE, GLUCOSEU, HGBUR, BILIRUBINUR, KETONESUR, PROTEINUR, UROBILINOGEN, NITRITE, LEUKOCYTESUR in the last 72 hours.  Invalid input(s): APPERANCEUR    Imaging: Ct Abdomen Pelvis Wo Contrast  Result Date: 04/28/2018 CLINICAL DATA:  Gross hematuria. One day postop from hysterectomy. Renal insufficiency. EXAM: CT ABDOMEN AND PELVIS WITHOUT CONTRAST TECHNIQUE: Multidetector CT imaging of the abdomen and pelvis was performed following the standard protocol without IV contrast. CT cystogram was performed using 225 mL Cysto-Conray by drip infusion. COMPARISON:  None. FINDINGS: Lower chest: Mild bibasilar atelectasis. Hepatobiliary: No mass visualized on this unenhanced exam. Gallbladder is unremarkable. Pancreas: No mass or inflammatory process visualized on this unenhanced exam. Spleen:  Within normal limits in size. Adrenals/Urinary tract: No evidence of urolithiasis. Mild to moderate left hydroureteronephrosis is seen. A Foley catheter is seen within the urinary bladder. No contrast extravasation from the urinary bladder is seen on full bladder or post drain images. Stomach/Bowel: Mild postop ileus noted. Vascular/Lymphatic: No pathologically enlarged lymph nodes identified. 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 No evidence of abdominal aortic aneurysm. Reproductive: Postop changes are seen from recent hysterectomy. Small hematoma is seen within the hysterectomy bed measuring approximately 5.1 x 4.9 cm. No other pelvic or retroperitoneal hematoma seen. Other:  None. Musculoskeletal:  No suspicious bone lesions identified. IMPRESSION: Small approximately 5 cm postop hematoma in the hysterectomy bed. No evidence of bladder injury. Mild to moderate left hydroureteronephrosis, which is of uncertain  etiology. Ureteral injury cannot be excluded on this unenhanced exam. Consider retrograde pyelogram for further evaluation. Large bilateral pelvic varices. These findings were discussed by phone with Dr. Alvester Morin on 04/28/2018 immediately after exam completion at 1515 hours. Electronically Signed   By: Myles Rosenthal M.D.   On: 04/28/2018 17:06   Dg Abd 1 View  Result Date: 04/28/2018 CLINICAL DATA:  Abdominal swelling. Patient is status post complete hysterectomy, postop day 1. EXAM: ABDOMEN - 1 VIEW COMPARISON:  None. FINDINGS: The bowel gas pattern is normal. No radio-opaque calculi or other significant radiographic abnormality are seen. Surgical clips are identified in the pelvis. IMPRESSION: No bowel obstruction noted. Electronically Signed   By: Sherian Rein M.D.   On: 04/28/2018 15:34   US Renal  Result Date: 04/28/2018 CLINICAL DATA:  Renal failure EXAM: RENAL / URINARY TRACT ULTRASOUND COMPLETE COMPARISON:  None. FINDINGS: Right Kidney: Length: 8.8 cm. Mildly echogenic right renal parenchyma. No right hydronephrosis. No right renal mass. Left Kidney: Length: 9.6 cm. Mildly echogenic left renal parenchyma. Mild left hydronephrosis. No left renal mass. Bladder: Bladder collapsed by Foley  catheter and cannot be evaluated on this scan. IMPRESSION: 1. Mild left hydronephrosis. Left ureteral obstruction cannot be excluded. Further imaging evaluation with hematuria protocol CT abdomen/pelvis without and with IV contrast may be obtained as clinically warranted. 2. No right hydronephrosis. 3. Mildly echogenic kidneys, indicative of nonspecific renal parenchymal disease of uncertain chronicity. 4. Bladder collapsed by Foley catheter and not evaluated on this scan. Electronically Signed   By: Delbert Phenix M.D.   On: 04/28/2018 14:01   Dg Chest Port 1 View  Result Date: 04/29/2018 CLINICAL DATA:  Central line placement. EXAM: PORTABLE CHEST 1 VIEW COMPARISON:  None. FINDINGS: The lungs are hypoexpanded. Mild left  basilar atelectasis is noted. No pleural effusion or pneumothorax is seen. A likely calcified granuloma is noted at the right midlung zone. The cardiomediastinal silhouette is borderline normal in size. No acute osseous abnormalities are identified. The patient's left IJ line is noted ending about the distal SVC. IMPRESSION: 1. Left IJ line noted ending about the distal SVC. No pneumothorax. 2. Lungs hypoexpanded, with mild left basilar atelectasis. Electronically Signed   By: Roanna Raider M.D.   On: 04/29/2018 05:28   Dg C-arm 1-60 Min  Result Date: 04/29/2018 CLINICAL DATA:  Left retrograde pyelogram and left ureteral stent placement EXAM: DG C-ARM 61-120 MIN COMPARISON:  04/28/2018 CT abdomen/pelvis FINDINGS: Fluoroscopy time 0 minutes 23 seconds. Multiple spot fluoroscopic intraoperative radiographs of left urinary tract demonstrate mild left hydroureteronephrosis with placement of a left nephroureteral stent with the proximal pigtail portion overlying the central left renal collecting system. No filling defects are demonstrated within the visualized left urinary tract. IMPRESSION: Intraoperative fluoroscopic guidance for left retrograde pyelogram and left nephroureteral stent placement. Electronically Signed   By: Delbert Phenix M.D.   On: 04/29/2018 07:20     Medications:   . piperacillin-tazobactam (ZOSYN)  IV Stopped (04/29/18 0736)   . albuterol  2.5 mg Nebulization Once  . docusate sodium  100 mg Oral BID  . enoxaparin (LOVENOX) injection  40 mg Subcutaneous Q24H  . famotidine  20 mg Oral BID  . HYDROmorphone   Intravenous Q4H  . LORazepam  1 mg Intravenous Once  . mouth rinse  15 mL Mouth Rinse BID  . sodium chloride flush  10-40 mL Intracatheter Q12H  . sodium chloride flush       diphenhydrAMINE **OR** diphenhydrAMINE, menthol-cetylpyridinium, naloxone **AND** sodium chloride flush, ondansetron **OR** ondansetron (ZOFRAN) IV, ondansetron (ZOFRAN) IV, promethazine, simethicone,  sodium chloride flush  Assessment/ Plan:  Ms. AMANADA PHILBRICK is a 42 y.o. white female with history of DVT, GERD, , who was admitted to [facility] on 04/27/2018 for total hysterectomy and BSO. She had extensive blood loss and then received IV contrast for CT scan 6/8.   1. Acute Renal Failure 2. Hyperkalemia 3. Metabolic Acidosis 4. Obstructive Uropathy  Impression Secondary to ATN from hypotension and acute blood loss. Worsening by obstructive uropathy with left hydronephrosis.  Potassium now at goal.  Now volume overload. Discontinue IV fluids - Appreciate Urology input. Status post left ureteral stent on 6/9 by Dr. Alvester Morin.    LOS: 2 Gearl Kimbrough 6/9/20199:23 AM

## 2018-04-29 NOTE — Op Note (Signed)
Operative Note  Preoperative diagnosis:  1.  Gross hematuria following a hysterectomy 2.  Acute renal insufficiency 3.  Left-sided hydronephrosis  Postoperative diagnosis: 1. 1.  Gross hematuria following a hysterectomy 2.  Acute renal insufficiency 3.  Left-sided hydronephrosis  Procedure(s): 1.  Cystoscopy with clot evacuation and fulguration 2.  Left retrograde pyelogram and left ureteral stent placement  Surgeon: Link Snuffer, MD  Assistants: None  Anesthesia: General  Complications: None immediate  EBL: 50 cc  Specimens: 1.  None  Drains/Catheters: 1.  6 x 24 double-J ureteral stent on the left 2.  24 French three-way catheter  Intraoperative findings: 1.  Normal urethra 2.  Friable bladder mucosa.  No discrete mass.  The bladder appeared to be quite contracted and did not expand well.  It was quite inflamed with diffuse erythema.  Pictures were taken and placed in the paper chart. 3.  Left retrograde pyelogram revealed mild hydronephrosis.  There was no obvious ureteral injury. 4.  I was able to visualize the right ureteral orifice and visualized efflux of urine.  I was however unable to access the ureter to perform a retrograde pyelogram.  Indication: 42 year old female underwent a complicated laparoscopic converted to open hysterectomy in which massive transfusion protocol had to be performed.  Postoperatively, she was noted to have gross hematuria.  CT cystogram revealed no evidence of any bladder injury.  There is left-sided hydroureteronephrosis.  Description of procedure:  The patient was identified and consent was obtained.  The patient was taken to the operating room and placed in the supine position.  The patient was placed under general anesthesia.  Perioperative antibiotics were administered.  The patient was placed in dorsal lithotomy.  Patient was prepped and draped in a standard sterile fashion and a timeout was performed.  A 21 French rigid cystoscope  was advanced into the bladder.  Clot was encountered and therefore this was exchanged for a resectoscope which was advanced with a blunt obturator in place and then switched out for the working element.  Clot evacuation was performed.  There was diffuse erythema throughout.  Small areas of bleeding were fulgurated.  I then exchanged back to a 21 [place] rigid cystoscope.  The ureteral orifice was difficult to identify on the left but I was able to advance a wire up the left ureter and into the kidney under fluoroscopic guidance.  I advanced an open-ended ureteral catheter over the wire remove the wire and shot a retrograde pyelogram with findings noted above.  Given the hydronephrosis and her acute renal insufficiency, I decided to leave ureteral stent.  I replaced the wire up to the kidney and remove the open-ended ureteral catheter.  I have placed a 6 x 24 double-J ureteral stent in a standard fashion followed by removal of the wire.  Fluoroscopy confirmed proximal and direct visualization confirmed distal placement.  Attention was made to the right ureteral orifice.  I was able to identify this and attempted to pass a sensor wire.  This met resistance.  I therefore tried a straight Glidewire followed by an angled Glidewire.  Neither of these were successful either.  I therefore tried to advance a ureteroscope just within the distal ureteral orifice but I was unable to identify the lumen and did not want to be overly aggressive with this to cause injury.  I therefore decided to abort any further attempts for the right side.  Of note, while trying to access that ureter, I did visualize efflux of urine.  The consistency of the urine was difficult to discern due to the presence of blood within the bladder already.  It did however appear to me to be clear efflux but again cannot 100% confirm.  I withdrew the scope and placed a 24 French three-way catheter and started continuous bladder irrigation.  This concluded  the operation.  The patient tolerated the procedure well and was stable postoperatively.    Plan: Wean continuous bladder irrigation.  Follow her kidney function.  If it continues to worsen, we can repeat a renal ultrasound.  If she ever develops right sided hydronephrosis, she would need nephrostomy tube versus nephroureteral stent placed antegrade.

## 2018-04-29 NOTE — Progress Notes (Signed)
Chaplain responded to page from unit. While en route Chaplain received a Rapid response. Chaplain arrived after the RR. Mother was in the waiting room teary. Mother talked about the lost of her husband with the Pt was you. Mother said "He was a good man" and begin to weep. She said she is afraid of death (definded as the Unknown). Mother don't thin she is going to "heaven" even though she is "saved" Chaplain explored her theology. She  Had many questions even if she was taught "you don't question God". Chaplain begin to explore the feelings and all the lost and suffering the family has had.  Mother became concerned if the Pt was ready and Chaplain checked and escorted mother back. Chaplain found daughter using some of the same verbiage as mother regarding healing. "I hope everything will be alright"  Chaplain prayed with the family for peace and strength.    04/29/18 0500  Clinical Encounter Type  Visited With Family;Patient and family together  Visit Type Initial;Spiritual support  Referral From Nurse  Spiritual Encounters  Spiritual Needs Prayer;Emotional

## 2018-04-29 NOTE — Progress Notes (Signed)
Chaplain provided patient, husband, and son with AD education. Patient drifted in and out of sleep throughout the AD overview. Husband expressed an interest in completing an AD for himself as well. Chaplain reminded the family that the patient needed to be able to make an informed decision. This determination will be affirmed by the notary. Family will follow-up with the on-call chaplain on Monday, if they wish to proceed.

## 2018-04-29 NOTE — Transfer of Care (Signed)
Immediate Anesthesia Transfer of Care Note  Patient: [name]  Procedure(s) Performed: CYSTOSCOPY WITH RETROGRADE PYELOGRAM/Left URETERAL STENT PLACEMENT, ureteroscopy (Bilateral )  Patient Location: PACU  Anesthesia Type:General  Level of Consciousness: drowsy and patient cooperative  Airway & Oxygen Therapy: Patient Spontanous Breathing and Patient connected to nasal cannula oxygen  Post-op Assessment: Report given to RN and Post -op Vital signs reviewed and stable  Post vital signs: Reviewed and stable  Last Vitals:  Vitals Value Taken Time  BP 129/68 04/29/2018  1:54 AM  Temp 36.1 C 04/29/2018  1:50 AM  Pulse 111 04/29/2018  1:58 AM  Resp 26 04/29/2018  1:59 AM  SpO2 100 % 04/29/2018  1:58 AM  Vitals shown include unvalidated device data.  Last Pain:  Vitals:   04/28/18 2028  TempSrc:   PainSc: Asleep      Patients Stated Pain Goal: 0 (04/27/18 2000)  Complications: No apparent anesthesia complications

## 2018-04-29 NOTE — Procedures (Signed)
Central Venous Catheter Insertion Procedure Note Amy SidleGinger B [name] [id number] 04/08/1976  Procedure: Insertion of Central Venous Catheter Indications: Assessment of intravascular volume, Drug and/or fluid administration and Frequent blood sampling  Procedure Details Consent: Risks of procedure as well as the alternatives and risks of each were explained to the (patient/caregiver).  Consent for procedure obtained. Time Out: Verified patient identification, verified procedure, site/side was marked, verified correct patient position, special equipment/implants available, medications/allergies/relevent history reviewed, required imaging and test results available.  Performed  Maximum sterile technique was used including antiseptics, cap, gloves, gown, hand hygiene, mask and sheet. Skin prep: Chlorhexidine; local anesthetic administered A antimicrobial bonded/coated triple lumen catheter was placed in the left internal jugular vein using the Seldinger technique.  Evaluation Blood flow good Complications: No apparent complications Patient did tolerate procedure well. Chest X-ray ordered to verify placement.  CXR: pending.  Left internal jugular CVL placed utilizing ultrasound no complications noted during or following procedure  [name], AGNP  Pulmonary/Critical Care Pager [phone] (please enter 7 digits) PCCM Consult Pager [phone] (please enter 7 digits)

## 2018-04-30 ENCOUNTER — Inpatient Hospital Stay: Payer: BLUE CROSS/BLUE SHIELD

## 2018-04-30 ENCOUNTER — Encounter: Payer: Self-pay | Admitting: Obstetrics and Gynecology

## 2018-04-30 ENCOUNTER — Other Ambulatory Visit: Payer: Self-pay | Admitting: *Deleted

## 2018-04-30 DIAGNOSIS — N179 Acute kidney failure, unspecified: Secondary | ICD-10-CM

## 2018-04-30 DIAGNOSIS — N19 Unspecified kidney failure: Secondary | ICD-10-CM

## 2018-04-30 DIAGNOSIS — R601 Generalized edema: Secondary | ICD-10-CM

## 2018-04-30 DIAGNOSIS — D62 Acute posthemorrhagic anemia: Secondary | ICD-10-CM

## 2018-04-30 DIAGNOSIS — N1330 Unspecified hydronephrosis: Secondary | ICD-10-CM

## 2018-04-30 HISTORY — PX: IR NEPHROSTOMY PLACEMENT RIGHT: IMG6064

## 2018-04-30 LAB — BASIC METABOLIC PANEL
ANION GAP: 13 (ref 5–15)
ANION GAP: 13 (ref 5–15)
BUN: 44 mg/dL — ABNORMAL HIGH (ref 6–20)
BUN: 46 mg/dL — ABNORMAL HIGH (ref 6–20)
CALCIUM: 6.5 mg/dL — AB (ref 8.9–10.3)
CHLORIDE: 94 mmol/L — AB (ref 101–111)
CO2: 23 mmol/L (ref 22–32)
CO2: 23 mmol/L (ref 22–32)
CREATININE: 3.71 mg/dL — AB (ref 0.44–1.00)
Calcium: 6.4 mg/dL — CL (ref 8.9–10.3)
Chloride: 96 mmol/L — ABNORMAL LOW (ref 101–111)
Creatinine, Ser: 3.36 mg/dL — ABNORMAL HIGH (ref 0.44–1.00)
GFR calc Af Amer: 18 mL/min — ABNORMAL LOW (ref >60)
GFR calc non Af Amer: 14 mL/min — ABNORMAL LOW (ref >60)
GFR, EST AFRICAN AMERICAN: 16 mL/min — AB (ref >60)
GFR, EST NON AFRICAN AMERICAN: 16 mL/min — AB (ref >60)
GLUCOSE: 167 mg/dL — AB (ref 65–99)
Glucose, Bld: 164 mg/dL — ABNORMAL HIGH (ref 65–99)
POTASSIUM: 4.4 mmol/L (ref 3.5–5.1)
Potassium: 4.2 mmol/L (ref 3.5–5.1)
Sodium: 132 mmol/L — ABNORMAL LOW (ref 135–145)
Sodium: 130 mmol/L — ABNORMAL LOW (ref 135–145)

## 2018-04-30 LAB — CBC
HCT: 30.6 % — ABNORMAL LOW (ref 35.0–47.0)
HEMOGLOBIN: 10.3 g/dL — AB (ref 12.0–16.0)
MCH: 30.2 pg (ref 26.0–34.0)
MCHC: 33.6 g/dL (ref 32.0–36.0)
MCV: 89.8 fL (ref 80.0–100.0)
PLATELETS: 99 10*3/uL — AB (ref 150–440)
RBC: 3.41 MIL/uL — ABNORMAL LOW (ref 3.80–5.20)
RDW: 15.1 % — AB (ref 11.5–14.5)
WBC: 19.7 10*3/uL — ABNORMAL HIGH (ref 3.6–11.0)

## 2018-04-30 LAB — MAGNESIUM: Magnesium: 2.7 mg/dL — ABNORMAL HIGH (ref 1.7–2.4)

## 2018-04-30 LAB — PROCALCITONIN: Procalcitonin: 1.12 ng/mL

## 2018-04-30 MED ORDER — PANTOPRAZOLE SODIUM 40 MG IV SOLR
40.0000 mg | Freq: Once | INTRAVENOUS | Status: AC
  Administered 2018-04-30: 40 mg via INTRAVENOUS

## 2018-04-30 MED ORDER — ENOXAPARIN SODIUM 30 MG/0.3ML ~~LOC~~ SOLN: 30.0000 mg | SUBCUTANEOUS | Status: DC

## 2018-04-30 MED ORDER — HEPARIN SODIUM (PORCINE) 5000 UNIT/ML IJ SOLN
5000.0000 [IU] | Freq: Three times a day (TID) | INTRAMUSCULAR | Status: DC
  Administered 2018-04-30 – 2018-05-15 (×42): 5000 [IU] via SUBCUTANEOUS
  Filled 2018-04-30 (×43): qty 1

## 2018-04-30 MED ORDER — MIDAZOLAM HCL 2 MG/2ML IJ SOLN
INTRAMUSCULAR | Status: AC | PRN
  Administered 2018-04-30: 1 mg via INTRAVENOUS

## 2018-04-30 MED ORDER — MIDAZOLAM HCL 5 MG/5ML IJ SOLN
INTRAMUSCULAR | Status: AC
  Filled 2018-04-30: qty 5

## 2018-04-30 MED ORDER — METOCLOPRAMIDE HCL 5 MG/ML IJ SOLN
5.0000 mg | Freq: Three times a day (TID) | INTRAMUSCULAR | Status: DC
  Administered 2018-04-30 – 2018-05-01 (×4): 5 mg via INTRAVENOUS
  Filled 2018-04-30 (×4): qty 2

## 2018-04-30 MED ORDER — POLYETHYLENE GLYCOL 3350 17 G PO PACK
17.0000 g | PACK | Freq: Every day | ORAL | Status: DC
  Administered 2018-04-30 – 2018-05-19 (×15): 17 g via ORAL
  Filled 2018-04-30 (×14): qty 1

## 2018-04-30 MED ORDER — PANTOPRAZOLE SODIUM 40 MG IV SOLR
40.0000 mg | Freq: Two times a day (BID) | INTRAVENOUS | Status: DC
  Administered 2018-04-30: 40 mg via INTRAVENOUS
  Filled 2018-04-30: qty 40

## 2018-04-30 MED ORDER — FENTANYL CITRATE (PF) 100 MCG/2ML IJ SOLN
INTRAMUSCULAR | Status: AC
  Filled 2018-04-30: qty 4

## 2018-04-30 MED ORDER — PIPERACILLIN-TAZOBACTAM 3.375 G IVPB
3.3750 g | Freq: Two times a day (BID) | INTRAVENOUS | Status: DC
  Administered 2018-04-30 – 2018-05-01 (×2): 3.375 g via INTRAVENOUS
  Filled 2018-04-30 (×2): qty 50

## 2018-04-30 MED ORDER — SODIUM CHLORIDE 0.9 % IV SOLN
INTRAVENOUS | Status: DC
  Administered 2018-04-30: 07:00:00 via INTRAVENOUS

## 2018-04-30 MED ORDER — FAMOTIDINE 20 MG PO TABS: 20.0000 mg | ORAL_TABLET | Freq: Every day | ORAL | Status: DC

## 2018-04-30 MED ORDER — MAGNESIUM SULFATE 4 GM/100ML IV SOLN
4.0000 g | Freq: Once | INTRAVENOUS | Status: AC
  Administered 2018-04-30: 4 g via INTRAVENOUS
  Filled 2018-04-30: qty 100

## 2018-04-30 MED ORDER — PHENOL 1.4 % MT LIQD
1.0000 | OROMUCOSAL | Status: DC | PRN
  Filled 2018-04-30: qty 177

## 2018-04-30 MED ORDER — METOCLOPRAMIDE HCL 5 MG/ML IJ SOLN: 5.0000 mg | Freq: Four times a day (QID) | INTRAMUSCULAR | Status: DC

## 2018-04-30 MED ORDER — METOPROLOL TARTRATE 5 MG/5ML IV SOLN
2.5000 mg | INTRAVENOUS | Status: DC | PRN
  Administered 2018-04-30: 2.5 mg via INTRAVENOUS
  Administered 2018-04-30: 5 mg via INTRAVENOUS
  Administered 2018-05-01: 2.5 mg via INTRAVENOUS
  Filled 2018-04-30 (×4): qty 5

## 2018-04-30 MED ORDER — LIDOCAINE HCL URETHRAL/MUCOSAL 2 % EX GEL
1.0000 "application " | Freq: Once | CUTANEOUS | Status: AC
  Administered 2018-04-30: 1 via TOPICAL
  Filled 2018-04-30: qty 5

## 2018-04-30 MED ORDER — FENTANYL CITRATE (PF) 100 MCG/2ML IJ SOLN
INTRAMUSCULAR | Status: AC | PRN
  Administered 2018-04-30: 50 ug via INTRAVENOUS

## 2018-04-30 MED ORDER — LIDOCAINE HCL (PF) 1 % IJ SOLN
INTRAMUSCULAR | Status: AC | PRN
  Administered 2018-04-30: 5 mL

## 2018-04-30 NOTE — Progress Notes (Signed)
Pharmacy consult noted for PPI IV BID. Order placed for pantoprazole 40 mg IV x 1 now followed by pantoprazole 40 mg IV BID.   Jerel Sardina A. Lutherookson, [place].D., BCPS Clinical Pharmacist 04/30/18 16:36

## 2018-04-30 NOTE — Progress Notes (Addendum)
PT PROFILE: [age] F with a PMH of GERD, DVT, and menometrorrhagia, S/P total hysterectomy and bilateral laparoscopic salpingectomy06/07. During procedure pt had an extensive blood loss from abherrant vessels resulting in hypotension requiring initiation of massive transfusion protocol. She received 750 ml of albumin, 3.6L of LR, 4 units of pRBC's, 2 pools of cryo, 1 unit of platelets, and 1 unit of FFP. Developed hematuria and L hydronephrosis 06/09 requiring cystoscopy and left ureteral stent placement.  Renal US 6/10 revealed right hydronephrosis.   SUBJ: Appears uncomfortable but no overt respiratory distress.  Complains of nausea  Vitals:   04/30/18 1000 04/30/18 1100 04/30/18 1200 04/30/18 1300  BP: 134/73 (!) 120/59 117/77 136/83  Pulse: (!) 124 (!) 132 (!) 131 (!) 142  Resp: (!) 22 16 19  (!) 22  Temp:   98.1 F (36.7 C)   TempSrc:   Oral   SpO2: 99% 94% 94% 96%  2 LPM [place]  Gen: NAD HEENT: NCAT, sclera white Neck: No JVD noted Lungs: breath sounds diminished, no wheezes or other adventitious sounds Cardiovascular: tachy, reg, no murmurs Abdomen: distended, soft, nontender, diminished BS Ext: diffuse anasarca Neuro: grossly intact Skin: Limited exam, no lesions noted  BMP Latest Ref Rng & Units 04/30/2018 04/29/2018 04/29/2018  Glucose 65 - 99 mg/dL 161(W167(H) 960(A165(H) 540(J196(H)  BUN 6 - 20 mg/dL 81(X44(H) 91(Y36(H) 78(G31(H)  Creatinine 0.44 - 1.00 mg/dL 9.56(O3.36(H) 1.30(Q2.90(H) 6.57(Q2.44(H)  Sodium 135 - 145 mmol/L 132(L) 133(L) 141  Potassium 3.5 - 5.1 mmol/L 4.2 4.2 4.0  Chloride 101 - 111 mmol/L 96(L) 96(L) 106  CO2 22 - 32 mmol/L 23 24 24   Calcium 8.9 - 10.3 mg/dL 4.6(N6.5(L) 6.5(L) 6.6(L)    CBC Latest Ref Rng & Units 04/30/2018 04/29/2018 04/29/2018  WBC 3.6 - 11.0 K/uL 19.7(H) 18.3(H) 16.1(H)  Hemoglobin 12.0 - 16.0 g/dL 10.3(L) 10.5(L) 10.6(L)  Hematocrit 35.0 - 47.0 % 30.6(L) 30.4(L) 31.1(L)  Platelets 150 - 440 K/uL 99(L) 87(L) 105(L)     Results for orders placed or performed during the hospital  encounter of 04/27/18  MRSA PCR Screening     Status: None   Collection Time: 04/27/18 10:27 PM  Result Value Ref Range Status   MRSA by PCR NEGATIVE NEGATIVE Final    Comment:        The GeneXpert MRSA Assay (FDA approved for NASAL specimens only), is one component of a comprehensive MRSA colonization surveillance program. It is not intended to diagnose MRSA infection nor to guide or monitor treatment for MRSA infections. Performed at [facility], [address] [place], [place] [address]   CULTURE, BLOOD (ROUTINE X 2) w Reflex to ID Panel     Status: None (Preliminary result)   Collection Time: 04/28/18  9:24 PM  Result Value Ref Range Status   Specimen Description BLOOD RIGHT HAND  Final   Special Requests   Final    BOTTLES DRAWN AEROBIC AND ANAEROBIC Blood Culture adequate volume   Culture   Final    NO GROWTH 2 DAYS Performed at [facility], [address] [place], [place] [address]    Report Status PENDING  Incomplete  CULTURE, BLOOD (ROUTINE X 2) w Reflex to ID Panel     Status: None (Preliminary result)   Collection Time: 04/28/18  9:44 PM  Result Value Ref Range Status   Specimen Description BLOOD RIGHT ANTECUBITAL  Final   Special Requests   Final    BOTTLES DRAWN AEROBIC AND ANAEROBIC Blood Culture adequate volume  Culture   Final    NO GROWTH 2 DAYS Performed at [facility], [address] [place], [place] [address]    Report Status PENDING  Incomplete    Anti-infectives (From admission, onward)   Start     Dose/Rate Route Frequency Ordered Stop   04/30/18 2200  piperacillin-tazobactam (ZOSYN) IVPB 3.375 g     3.375 g 12.5 mL/hr over 240 Minutes Intravenous Every 12 hours 04/30/18 0833     04/29/18 0330  piperacillin-tazobactam (ZOSYN) IVPB 3.375 g  Status:  Discontinued     3.375 g 12.5 mL/hr over 240 Minutes Intravenous Every 8 hours 04/29/18 0317 04/30/18 0833   04/27/18 0615  ceFAZolin (ANCEF) 2-4 GM/100ML-% IVPB     Note to Pharmacy:  Lorrene Reid   : cabinet override      04/27/18 0615 04/27/18 0746   04/27/18 0600  ceFAZolin (ANCEF) IVPB 2g/100 mL premix     2 g 200 mL/hr over 30 Minutes Intravenous On call to O.R. 04/26/18 2240 04/27/18 1142      Renal US: R hydro  CXR: NNF  IMP: S/P hysterectomy 06/07 with intra-abdominal bleeding complications  Status post massive transfusion protocol Severe hematuria on continuous bladder irrigation Left hydronephrosis  Status post left ureteral stent Right hydronephrosis AKI, urine output difficult to measure due to CBI Anasarca Acute blood loss anemia, not actively bleeding Ileus with nausea Mildly elevated PCT  PLAN/REC: Discussed with nephrology Discussed with urology I have discussed with IR.  Percutaneous nephrostomy tube requested Recheck BMET later this afternoon Might require HD cath to initiate HD  Cont thin liquids Continue antiemetics Continue PIP-tazo   Billy Fischer, MD PCCM service Mobile [phone] Pager [phone] 04/30/2018 1:46 PM

## 2018-04-30 NOTE — Progress Notes (Signed)
Patient has been nauseous all shift. Had Zofran x 1 and phenergan x 2. Patient was also medicated x 2 for pain and discomfort. CBI continuous. NP ordered labs and abdominal xray since patient has not been looking well. Magnesium 1.4 was replaced this shift. Lactic acid down to 1.8. Abd xray showed possible ileus.  Abdomen still distended and taut. Patient resting now with eyes closed. Mother at bedside. Husband called to check on hemoglobin and kidney function. Continue to monitor.

## 2018-04-30 NOTE — Progress Notes (Signed)
Pharmacy Antibiotic Note  [name] is a 42 y.o. female admitted on 04/27/2018 with sepsis.  Pharmacy has been consulted for zosyn dosing.  Plan: Patient's baseline Scr 0.9. Currently Scr is over 3x baseline level at 3.36.  Will adjust dose of Zosyn to 3.375 UV EI every 12 hours.     Temp (24hrs), Avg:97.5 F (36.4 C), Min:96.4 F (35.8 C), Max:97.8 F (36.6 C)  Recent Labs  Lab 04/27/18 1840 04/28/18 1043  04/28/18 1822 04/28/18 2106 04/28/18 2144 04/29/18 0503 04/29/18 2226 04/30/18 0628  WBC 17.2* 17.3*  --   --   --   --  16.1* 18.3* 19.7*  CREATININE  --  2.09*   < > 2.47*  --  2.58* 2.44* 2.90* 3.36*  LATICACIDVEN  --   --   --   --  4.2*  --  2.5* 1.8  --    < > = values in this interval not displayed.    Estimated Creatinine Clearance: 24.6 mL/min (A) (by C-G formula based on SCr of 3.36 mg/dL (H)).    No Known Allergies   Thank you for allowing pharmacy to be a part of this patient's care.  [name], PharmD, BCPS Clinical Pharmacist 04/30/2018 8:37 AM

## 2018-04-30 NOTE — Progress Notes (Signed)
PT Cancellation Note  Patient Details Name: [name] MRN: [id number] DOB: 11/18/1976   Cancelled Treatment:    Reason Eval/Treat Not Completed: Medical issues which prohibited therapy; Pt's most recent resting HR 140 bpm.  Per MD note from this date troponin bumped overnight to 0.29, with planned repeat series today.  Will hold PT evaluation this date and will attempt to see pt at a future date/time as medically appropriate.       Ovidio Hanger. Scott Perel Hauschild PT, DPT 04/30/18, 8:45 AM

## 2018-04-30 NOTE — Procedures (Signed)
  Procedure: CT right perc nephrostomy catheter 1018f EBL:   minimal Complications:  none immediate  See full dictation in [facility] PACS.  Thora Lance. Tyquan Carmickle MD Main # [phone] Pager  [phone]

## 2018-04-30 NOTE — Progress Notes (Addendum)
2 Days Post-Op  Procedure(s):  1. 04/28/18: ATTEMPTED LAPAROSCOPIC HYSTERECTOMY CONVERTED TO SUPRACERVICAL ABDOMINAL HYSTERECTOMY (N/A) SALPINGO OOPHORECTOMY (Left) CYSTOSCOPY (N/A) UNILATERAL SALPINGECTOMY (Right) LAPAROSCOPIC LYSIS OF ADHESIONS, RETROPERITONEAL DISSECTION  2. 04/29/18: CYSTOSCOPY WITH RETROGRADE PYELOGRAM/Left URETERAL STENT PLACEMENT, ureteroscopy (Bilateral)    Subjective: Pt still in ICU, looking less well than yesterday with abdominal distension, lethargic. Pain from Foley >pain at incision. Family at bedside, good support.        - Today is looking more distended through the abdomin. Nausea has returned after tolerating clear tray for breakfast. Overnight NPO, and pain controlled with iv meds, holding po percocet. Overnight a KUB showed ileus with gas present to at least the distal sigmoid colon.        - Her hyperkalemia has resolved.  - s/p OR with Dr. Alvester Morin on 04/29/18 from urology for cystoscopy and left pyelogram and left ureteral stent placement for investigation of bladder and ureters after continued low urine output and bloody urine with pelvic pain. Previously, pt did have CT scan without contrast because of acute renal insufficiency to evaluate bladder, and no bladder injury noted. 5cm hematoma in pelvis noted on CT scan, which I would expect given the surgery. She has a left A-line in place. She has a three-way foley that is irrigating her bladder, paused yesterday and restarted last night for dark urine.  She has seen nephrology, who recommended D/C iv fluids due to fluid overload. Creatinine improved to 2.44 (highest 2.58 at 2200 6/8) but now worsened again to 2.90, possibly because of hypovolemia as po intake yesterday minimal.   She remains tachycardic in sinus rhythm but improved from 140s to 1-teens yesterday, now again in the 140s-150s. Troponin bumped overnight 0.29, with planned repeat series today.  Her dilaudid PCA is bolus only, and she only needs it  every couple of hour yesterday, and has now been off for >12 hrs.  Her Lovenox is still held and has not been given since surgery. Because of her creatinine, dose dropped to 30 by pharmacy if we start, but still holding due to hematuria.  She was started on Zosyn for clinical dx of sepsis with persistent tachycardia, elevated lactic acid 2.5 (4.2) and elevated WBC to 16.1 (down from 17.3 just postop) , 3.375g q8hrs. WBC now bumped minimally to 18.3 today.  H/H remains stable. Platelets dropping, now at 87.  Objective: Vital signs in last 24 hours: Temp:  [96.4 F (35.8 C)-97.8 F (36.6 C)] 97.8 F (36.6 C) (06/10 0400) Pulse Rate:  [107-154] 144 (06/10 0600) Resp:  [14-25] 24 (06/10 0600) BP: (111-148)/(72-121) 132/72 (06/10 0600) SpO2:  [91 %-100 %] 97 % (06/10 0600)  Intake/Output  Intake/Output Summary (Last 24 hours) at 04/30/2018 0645 Last data filed at 04/30/2018 0607 Gross per 24 hour  Intake 24451.2 ml  Output 40981 ml  Net -4423.8 ml    Physical Exam:  General: Lethargic, but oriented and responsive, appears ill. CV: Regular rhythm, no murmurs, increased rate Lungs: Decreased breath sounds in bilateral bases, L>R GI: Tense, moderately-distended >than yesterday. Ecchymosis over mons deepening, and lap incisions clean and dry. Minimal bowel sounds.   Incisions: Clean and dry, honeycomb dressing in place. Urine: Pink, Foley in place, irrigation flowing Extremities: Nontender, no erythema, significant edema in all extremities more tense than yesterday, with a bola on left lateral arm at site of prior IV, covered with a dressing. Fingers tensely edematous.  Lab Results: Recent Labs    04/28/18 1043 04/29/18 0503 04/29/18 2226  HGB 11.7* 10.6* 10.5*  HCT 34.9* 31.1* 30.4*  WBC 17.3* 16.1* 18.3*  PLT 145* 105* 87*                 Results for orders placed or performed during the hospital encounter of 04/27/18 (from the past 24 hour(s))  CBC     Status: Abnormal    Collection Time: 04/29/18 10:26 PM  Result Value Ref Range   WBC 18.3 (H) 3.6 - 11.0 K/uL   RBC 3.42 (L) 3.80 - 5.20 MIL/uL   Hemoglobin 10.5 (L) 12.0 - 16.0 g/dL   HCT 16.1 (L) 09.6 - 04.5 %   MCV 89.0 80.0 - 100.0 fL   MCH 30.6 26.0 - 34.0 pg   MCHC 34.4 32.0 - 36.0 g/dL   RDW 40.9 (H) 81.1 - 91.4 %   Platelets 87 (L) 150 - 440 K/uL  Comprehensive metabolic panel     Status: Abnormal   Collection Time: 04/29/18 10:26 PM  Result Value Ref Range   Sodium 133 (L) 135 - 145 mmol/L   Potassium 4.2 3.5 - 5.1 mmol/L   Chloride 96 (L) 101 - 111 mmol/L   CO2 24 22 - 32 mmol/L   Glucose, Bld 165 (H) 65 - 99 mg/dL   BUN 36 (H) 6 - 20 mg/dL   Creatinine, Ser 7.82 (H) 0.44 - 1.00 mg/dL   Calcium 6.5 (L) 8.9 - 10.3 mg/dL   Total Protein 5.6 (L) 6.5 - 8.1 g/dL   Albumin 3.0 (L) 3.5 - 5.0 g/dL   AST 35 15 - 41 U/L   ALT 15 14 - 54 U/L   Alkaline Phosphatase 62 38 - 126 U/L   Total Bilirubin 0.7 0.3 - 1.2 mg/dL   GFR calc non Af Amer 19 (L) >60 mL/min   GFR calc Af Amer 22 (L) >60 mL/min   Anion gap 13 5 - 15  Magnesium     Status: Abnormal   Collection Time: 04/29/18 10:26 PM  Result Value Ref Range   Magnesium 1.4 (L) 1.7 - 2.4 mg/dL  Lipase, blood     Status: None   Collection Time: 04/29/18 10:26 PM  Result Value Ref Range   Lipase 20 11 - 51 U/L  Amylase     Status: None   Collection Time: 04/29/18 10:26 PM  Result Value Ref Range   Amylase 53 28 - 100 U/L  Troponin I     Status: Abnormal   Collection Time: 04/29/18 10:26 PM  Result Value Ref Range   Troponin I 0.29 (HH) <0.03 ng/mL  Lactic acid, plasma     Status: None   Collection Time: 04/29/18 10:26 PM  Result Value Ref Range   Lactic Acid, Venous 1.8 0.5 - 1.9 mmol/L  Phosphorus     Status: None   Collection Time: 04/29/18 10:26 PM  Result Value Ref Range   Phosphorus 4.2 2.5 - 4.6 mg/dL  Procalcitonin - Baseline     Status: None   Collection Time: 04/29/18 10:26 PM  Result Value Ref Range   Procalcitonin 0.93  ng/mL    Assessment/Plan: 2 Days Post-Op        Procedure(s): 1. 04/28/18: ATTEMPTED LAPAROSCOPIC HYSTERECTOMY CONVERTED TO SUPRACERVICAL ABDOMINAL HYSTERECTOMY (N/A) SALPINGO OOPHORECTOMY (Left) CYSTOSCOPY (N/A) UNILATERAL SALPINGECTOMY (Right) LAPAROSCOPIC LYSIS OF ADHESIONS, RETROPERITONEAL DISSECTION   2. 04/29/18: CYSTOSCOPY WITH RETROGRADE PYELOGRAM/Left URETERAL STENT PLACEMENT, ureteroscopy (Bilateral)   Ileus: abdominal distension, NPO. Consider NG tube, will leave to ICU team to decide and  place.   Acute renal insufficiency - no NSAIDs, holding heparin. I restarted maintenance fluids today 11000ml/hr.   Hyperkalemia - resolved Leukocytosis - expected. On zosyn. Her procalcitonin is stable.  Thrombocytopenia - pt received 1 pack platelets in the OR on 04/28/18, has been dropping since. Consider replacement if bleeding and below 50 or if below 20. She is not on heparin.  Hematuria - foley in place. Appreciate urology and nephrology recs.  Pain: s/p PCA. Will restart if needed  Tachycardia - persistent. O2 sats 98-100 on room air. Could consider r/u PE with hx of DVT, now not ambulating and s/p major surgery. However, her loss of blood, concern for ileus (though no evidence of continued bleeding into abdomen) and persistent hematuria requiring bladder irrigation has prompted us to hold her heparin, which had been planned for 10 days postop. Will repeat troponin for trend.  Acute blood loss anemia, s/p massive transfusion protocol - stable Third-spacing fluid in extremities - no lasix    DVT is still a risk, but will hold off on lovenox while still monitoring bleeding. Pulm edema and resultant pneumonia still possible, and will encourage ambulation.  1) When tolerated recommend out of bed to chair and Ambulate. Encourage Incentive spirometry.  - PT consult placed yesterday. Pain meds as needed to encourage out of bed.  Appreciate urology and nephrology assistance.  Appreciate  intensivist involvement for monitoring and treatment.    Amy DouglasBethany Betzaida Cremeens, MD   LOS: 3 days   [name] 04/30/2018, 6:45 AMPatient ID: [name], female   DOB: 03/23/1976, 42 y.o.   MRN: [id number]

## 2018-04-30 NOTE — Progress Notes (Signed)
Consult noted for Post IR Procedure Consult - Anticoagulant/Antiplatelet PTA/Inpatient Med List Review by Pharmacist. No anticoagulant/antiplatelet PTA/Inpatient Med has been held/needs to be resumed.   Henley Boettner A. [place], [place]arm.D., BCPS Clinical Pharmacist 04/30/18 16:21

## 2018-04-30 NOTE — Progress Notes (Signed)
Urology Consult Follow Up  Subjective: CBI stopped yesterday, but resumed overnight after urine became bloody again with small clots.  Urine output unclear to due to being on CBI.  Worsening renal function.  Renal ultrasound now shows new moderate left-sided hydronephrosis and persistent left-sided hydronephrosis with stent in place while in CBI.  Anti-infectives: Anti-infectives (From admission, onward)   Start     Dose/Rate Route Frequency Ordered Stop   04/30/18 2200  piperacillin-tazobactam (ZOSYN) IVPB 3.375 g     3.375 g 12.5 mL/hr over 240 Minutes Intravenous Every 12 hours 04/30/18 0833     04/29/18 0330  piperacillin-tazobactam (ZOSYN) IVPB 3.375 g  Status:  Discontinued     3.375 g 12.5 mL/hr over 240 Minutes Intravenous Every 8 hours 04/29/18 0317 04/30/18 0833   04/27/18 0615  ceFAZolin (ANCEF) 2-4 GM/100ML-% IVPB    Note to Pharmacy:  Lorrene Reid   : cabinet override      04/27/18 0615 04/27/18 0746   04/27/18 0600  ceFAZolin (ANCEF) IVPB 2g/100 mL premix     2 g 200 mL/hr over 30 Minutes Intravenous On call to O.R. 04/26/18 2240 04/27/18 1142      Current Facility-Administered Medications  Medication Dose Route Frequency Provider Last Rate Last Dose  . acetaminophen (TYLENOL) tablet 1,000 mg  1,000 mg Oral Q8H Christeen Douglas, MD   1,000 mg at 04/29/18 1344  . albuterol (PROVENTIL) (2.5 MG/3ML) 0.083% nebulizer solution 2.5 mg  2.5 mg Nebulization Once Ray Church III, MD      . docusate sodium (COLACE) capsule 100 mg  100 mg Oral BID Ray Church III, MD   100 mg at 04/30/18 0925  . fentaNYL (SUBLIMAZE) 100 MCG/2ML injection           . heparin injection 5,000 Units  5,000 Units Subcutaneous Q8H Merwyn Katos, MD      . HYDROmorphone (DILAUDID) injection 1 mg  1 mg Intravenous Q2H PRN Christeen Douglas, MD   1 mg at 04/30/18 1058  . MEDLINE mouth rinse  15 mL Mouth Rinse BID Ray Church III, MD   15 mL at 04/29/18 2143  . menthol-cetylpyridinium (CEPACOL)  lozenge 3 mg  1 lozenge Oral Q2H PRN Ray Church III, MD      . metoCLOPramide (REGLAN) injection 5 mg  5 mg Intravenous Q8H Merwyn Katos, MD   5 mg at 04/30/18 1335  . metoprolol tartrate (LOPRESSOR) injection 2.5-5 mg  2.5-5 mg Intravenous Q3H PRN Merwyn Katos, MD   2.5 mg at 04/30/18 0918  . midazolam (VERSED) 5 MG/5ML injection           . ondansetron (ZOFRAN) injection 4 mg  4 mg Intravenous Q6H PRN Ray Church III, MD   4 mg at 04/29/18 2142  . oxybutynin (DITROPAN) tablet 5 mg  5 mg Oral TID Ray Church III, MD   5 mg at 04/30/18 0925  . oxyCODONE (Oxy IR/ROXICODONE) immediate release tablet 5 mg  5 mg Oral Q4H PRN Christeen Douglas, MD   5 mg at 04/29/18 1222  . piperacillin-tazobactam (ZOSYN) IVPB 3.375 g  3.375 g Intravenous Q12H Hallaji, Sheema M, RPH      . polyethylene glycol (MIRALAX / GLYCOLAX) packet 17 g  17 g Oral Daily Christeen Douglas, MD   17 g at 04/30/18 0925  . promethazine (PHENERGAN) injection 12.5-25 mg  12.5-25 mg Intravenous Q6H PRN Tukov-Yual, Magdalene S, NP   25 mg at 04/30/18 1515  .  sodium chloride flush (NS) 0.9 % injection 10-40 mL  10-40 mL Intracatheter Q12H Eugenie Norrie, NP   30 mL at 04/30/18 0925  . sodium chloride flush (NS) 0.9 % injection 10-40 mL  10-40 mL Intracatheter PRN Eugenie Norrie, NP         Objective: Vital signs in last 24 hours: Temp:  [97.5 F (36.4 C)-98.1 F (36.7 C)] 98.1 F (36.7 C) (06/10 1200) Pulse Rate:  [113-154] 117 (06/10 1400) Resp:  [14-25] 19 (06/10 1400) BP: (102-145)/(59-93) 124/75 (06/10 1400) SpO2:  [91 %-100 %] 97 % (06/10 1400)  Intake/Output from previous day: 06/09 0701 - 06/10 0700 In: 24451.2 [I.V.:201.2; IV Piggyback:250] Out: 16109 [Urine:26625] Intake/Output this shift: Total I/O In: 6030 [I.V.:30; Other:6000] Out: 60454 [Urine:12695]   Physical Exam  Sleepy, non-conversive Abdomen distended, appropriately tender Foley in place draining very light pink urine on slow drip   diffuse anasarca appreciated   Lab Results:  Recent Labs    04/29/18 2226 04/30/18 0628  WBC 18.3* 19.7*  HGB 10.5* 10.3*  HCT 30.4* 30.6*  PLT 87* 99*   BMET Recent Labs    04/30/18 0628 04/30/18 1329  NA 132* 130*  K 4.2 4.4  CL 96* 94*  CO2 23 23  GLUCOSE 167* 164*  BUN 44* 46*  CREATININE 3.36* 3.71*  CALCIUM 6.5* 6.4*    Studies/Results: Ct Abdomen Pelvis Wo Contrast  Result Date: 04/28/2018 CLINICAL DATA:  Gross hematuria. One day postop from hysterectomy. Renal insufficiency. EXAM: CT ABDOMEN AND PELVIS WITHOUT CONTRAST TECHNIQUE: Multidetector CT imaging of the abdomen and pelvis was performed following the standard protocol without IV contrast. CT cystogram was performed using 225 mL Cysto-Conray by drip infusion. COMPARISON:  None. FINDINGS: Lower chest: Mild bibasilar atelectasis. Hepatobiliary: No mass visualized on this unenhanced exam. Gallbladder is unremarkable. Pancreas: No mass or inflammatory process visualized on this unenhanced exam. Spleen:  Within normal limits in size. Adrenals/Urinary tract: No evidence of urolithiasis. Mild to moderate left hydroureteronephrosis is seen. A Foley catheter is seen within the urinary bladder. No contrast extravasation from the urinary bladder is seen on full bladder or post drain images. Stomach/Bowel: Mild postop ileus noted. Vascular/Lymphatic: No pathologically enlarged lymph nodes identified. 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 No evidence of abdominal aortic aneurysm. Reproductive: Postop changes are seen from recent hysterectomy. Small hematoma is seen within the hysterectomy bed measuring approximately 5.1 x 4.9 cm. No other pelvic or retroperitoneal hematoma seen. Other:  None. Musculoskeletal:  No suspicious bone lesions identified. IMPRESSION: Small approximately 5 cm postop hematoma in the hysterectomy bed. No evidence of bladder injury. Mild to moderate left hydroureteronephrosis, which is of  uncertain etiology. Ureteral injury cannot be excluded on this unenhanced exam. Consider retrograde pyelogram for further evaluation. Large bilateral pelvic varices. These findings were discussed by phone with Dr. Alvester Morin on 04/28/2018 immediately after exam completion at 1515 hours. Electronically Signed   By: Myles Rosenthal M.D.   On: 04/28/2018 17:06   Dg Abd 1 View  Result Date: 04/29/2018 CLINICAL DATA:  Abdominal distension EXAM: ABDOMEN - 1 VIEW COMPARISON:  CT abdomen pelvis 04/28/2018 FINDINGS: Air-filled loops of large and small bowel throughout the abdomen. No definite bowel wall thickening. Gas present to at least the distal sigmoid colon. Findings favor ileus. LEFT ureteral stent noted. Osseous structures unremarkable. IMPRESSION: Air-filled loops of large and small bowel favoring ileus. Electronically Signed   By: Ulyses Southward M.D.   On: 04/29/2018 23:20  [place] Renal  Result Date: 04/30/2018 CLINICAL DATA:  Acute renal failure. EXAM: RENAL / URINARY TRACT ULTRASOUND COMPLETE COMPARISON:  CT scan of the abdomen dated 04/28/2018 FINDINGS: Right Kidney: Length: 9.4 cm. New moderate right hydronephrosis. Echogenicity within normal limits. No mass lesions. Left Kidney: Length: 11.5 cm. Moderate left hydronephrosis as demonstrated on the prior CT scan. Echogenicity within normal limits. No mass visualized. Bladder: Foley catheter in place.  Air obscured the bladder. IMPRESSION: 1. New moderate right hydronephrosis since the prior CT scan of 04/28/2018. 2. Persistent moderate left hydronephrosis, unchanged. Electronically Signed   By: Francene BoyersJames  Maxwell M.D.   On: 04/30/2018 10:27   Dg Chest Port 1 View  Result Date: 04/29/2018 CLINICAL DATA:  Central line placement. EXAM: PORTABLE CHEST 1 VIEW COMPARISON:  None. FINDINGS: The lungs are hypoexpanded. Mild left basilar atelectasis is noted. No pleural effusion or pneumothorax is seen. A likely calcified granuloma is noted at the right midlung zone. The  cardiomediastinal silhouette is borderline normal in size. No acute osseous abnormalities are identified. The patient's left IJ line is noted ending about the distal SVC. IMPRESSION: 1. Left IJ line noted ending about the distal SVC. No pneumothorax. 2. Lungs hypoexpanded, with mild left basilar atelectasis. Electronically Signed   By: Roanna RaiderJeffery  Chang M.D.   On: 04/29/2018 05:28   Dg C-arm 1-60 Min  Result Date: 04/29/2018 CLINICAL DATA:  Left retrograde pyelogram and left ureteral stent placement EXAM: DG C-ARM 61-120 MIN COMPARISON:  04/28/2018 CT abdomen/pelvis FINDINGS: Fluoroscopy time 0 minutes 23 seconds. Multiple spot fluoroscopic intraoperative radiographs of left urinary tract demonstrate mild left hydroureteronephrosis with placement of a left nephroureteral stent with the proximal pigtail portion overlying the central left renal collecting system. No filling defects are demonstrated within the visualized left urinary tract. IMPRESSION: Intraoperative fluoroscopic guidance for left retrograde pyelogram and left nephroureteral stent placement. Electronically Signed   By: Delbert PhenixJason A Poff M.D.   On: 04/29/2018 07:20   Renal ultrasound personally reviewed.  Assessment/ Plan:  1.  Gross hematuria- appears to be improving, would titrate off CBI if possible to allow for better measurement of urine output as well as decrease the degree of bladder distention.  2.  Left hydronephrosis- status post ureteral stent.  Hydronephrosis on renal ultrasound today performed in the setting of full bladder on CBI, suspect reflux is contributing to this.  3. Right hydronephrosis-new and not insignificant.  Likely contributing to acute kidney injury amongst other factors.  Suspect this is related to edema/inflammation within the retroperitoneum and bladder.  Discussed with Dr. Thedore MinsSingh, Dr. Dalbert GarnetBeasley, and Dr. Sharol HarnessSimmons.  Agree for placement of right percutaneous nephrostomy tube to remove urinary obstruction as part of  controlling factors to her overall poor and worsening renal function.    LOS: 3 days    [name] 04/30/2018

## 2018-04-30 NOTE — Progress Notes (Signed)
[facility], [place] 04/30/18  Subjective:   Patient underwent abdominal hysterectomy, left salpingo-oophorectomy, laparoscopic lysis of adhesions and retroperitoneal dissection on 04/28/2018.  Followed by cystoscopy with retrograde pyelogram and left ureteral stent placement on 04/29/2018 Baseline creatinine is 0.79 from 04/19/2018 Patient has developed postoperative acute renal failure, left hydronephrosis for which left ureteral stent was placed on June 9 Lactic acid levels are improving Patient is getting CBI. Accurate UOP not available S Ceatinine is worse today Patient reports nausea. Not able to tolerate clears consistently Potassium is normal    Objective:  Vital signs in last 24 hours:  Temp:  [96.4 F (35.8 C)-97.8 F (36.6 C)] 97.8 F (36.6 C) (06/10 0723) Pulse Rate:  [113-154] 140 (06/10 0800) Resp:  [14-25] 21 (06/10 0800) BP: (102-148)/(70-121) 102/86 (06/10 0800) SpO2:  [91 %-100 %] 96 % (06/10 0800)  Weight change:  There were no vitals filed for this visit.  Intake/Output:    Intake/Output Summary (Last 24 hours) at 04/30/2018 0819 Last data filed at 04/30/2018 0756 Gross per 24 hour  Intake 30200.6 ml  Output 16109 ml  Net -949.4 ml     Physical Exam: General: Ill appearing, laying in bed  HEENT Dry oral mucus membranes  Neck supple  Pulm/lungs Decreased breath sounds at bases  CVS/Heart Tachycardic, regular  Abdomen:  Distended, sluggish bowel sounds  Extremities: +++ tight edema  Neurologic: Lethargic, but able to answer questions appropriately  Skin: No acute rashes   Foley in place       Basic Metabolic Panel:  Recent Labs  Lab 04/28/18 1446 04/28/18 1822 04/28/18 2144 04/29/18 0503 04/29/18 2226  NA 136 139 142 141 133*  K 6.2* 5.8* 4.2 4.0 4.2  CL 112* 111 104 106 96*  CO2 13* 16* 26 24 24   GLUCOSE 170* 194* 241* 196* 165*  BUN 25* 28* 28* 31* 36*  CREATININE 2.55* 2.47* 2.58* 2.44* 2.90*  CALCIUM  7.1* 7.5* 7.2* 6.6* 6.5*  MG  --   --   --   --  1.4*  PHOS  --   --   --   --  4.2     CBC: Recent Labs  Lab 04/27/18 1840 04/28/18 1043 04/29/18 0503 04/29/18 2226 04/30/18 0628  WBC 17.2* 17.3* 16.1* 18.3* 19.7*  NEUTROABS  --   --  14.5*  --   --   HGB 13.4 11.7* 10.6* 10.5* 10.3*  HCT 39.1 34.9* 31.1* 30.4* 30.6*  MCV 89.7 89.7 90.2 89.0 89.8  PLT 215 145* 105* 87* 99*     No results found for: HEPBSAG, HEPBSAB, HEPBIGM    Microbiology:  Recent Results (from the past 240 hour(s))  MRSA PCR Screening     Status: None   Collection Time: 04/27/18 10:27 PM  Result Value Ref Range Status   MRSA by PCR NEGATIVE NEGATIVE Final    Comment:        The GeneXpert MRSA Assay (FDA approved for NASAL specimens only), is one component of a comprehensive MRSA colonization surveillance program. It is not intended to diagnose MRSA infection nor to guide or monitor treatment for MRSA infections. Performed at [facility], [address] [place], [place] [address]   CULTURE, BLOOD (ROUTINE X 2) w Reflex to ID Panel     Status: None (Preliminary result)   Collection Time: 04/28/18  9:24 PM  Result Value Ref Range Status   Specimen Description BLOOD RIGHT HAND  Final   Special Requests  Final    BOTTLES DRAWN AEROBIC AND ANAEROBIC Blood Culture adequate volume   Culture   Final    NO GROWTH 2 DAYS Performed at [facility], [address] [place], [place] [address]    Report Status PENDING  Incomplete  CULTURE, BLOOD (ROUTINE X 2) w Reflex to ID Panel     Status: None (Preliminary result)   Collection Time: 04/28/18  9:44 PM  Result Value Ref Range Status   Specimen Description BLOOD RIGHT ANTECUBITAL  Final   Special Requests   Final    BOTTLES DRAWN AEROBIC AND ANAEROBIC Blood Culture adequate volume   Culture   Final    NO GROWTH 2 DAYS Performed at [facility], [address], [place], [place] [address]    Report Status PENDING   Incomplete    Coagulation Studies: No results for input(s): LABPROT, INR in the last 72 hours.  Urinalysis: No results for input(s): COLORURINE, LABSPEC, PHURINE, GLUCOSEU, HGBUR, BILIRUBINUR, KETONESUR, PROTEINUR, UROBILINOGEN, NITRITE, LEUKOCYTESUR in the last 72 hours.  Invalid input(s): APPERANCEUR    Imaging: Ct Abdomen Pelvis Wo Contrast  Result Date: 04/28/2018 CLINICAL DATA:  Gross hematuria. One day postop from hysterectomy. Renal insufficiency. EXAM: CT ABDOMEN AND PELVIS WITHOUT CONTRAST TECHNIQUE: Multidetector CT imaging of the abdomen and pelvis was performed following the standard protocol without IV contrast. CT cystogram was performed using 225 mL Cysto-Conray by drip infusion. COMPARISON:  None. FINDINGS: Lower chest: Mild bibasilar atelectasis. Hepatobiliary: No mass visualized on this unenhanced exam. Gallbladder is unremarkable. Pancreas: No mass or inflammatory process visualized on this unenhanced exam. Spleen:  Within normal limits in size. Adrenals/Urinary tract: No evidence of urolithiasis. Mild to moderate left hydroureteronephrosis is seen. A Foley catheter is seen within the urinary bladder. No contrast extravasation from the urinary bladder is seen on full bladder or post drain images. Stomach/Bowel: Mild postop ileus noted. Vascular/Lymphatic: No pathologically enlarged lymph nodes identified. 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 No evidence of abdominal aortic aneurysm. Reproductive: Postop changes are seen from recent hysterectomy. Small hematoma is seen within the hysterectomy bed measuring approximately 5.1 x 4.9 cm. No other pelvic or retroperitoneal hematoma seen. Other:  None. Musculoskeletal:  No suspicious bone lesions identified. IMPRESSION: Small approximately 5 cm postop hematoma in the hysterectomy bed. No evidence of bladder injury. Mild to moderate left hydroureteronephrosis, which is of uncertain etiology. Ureteral injury cannot be  excluded on this unenhanced exam. Consider retrograde pyelogram for further evaluation. Large bilateral pelvic varices. These findings were discussed by phone with Dr. Alvester Morin on 04/28/2018 immediately after exam completion at 1515 hours. Electronically Signed   By: Myles Rosenthal M.D.   On: 04/28/2018 17:06   Dg Abd 1 View  Result Date: 04/29/2018 CLINICAL DATA:  Abdominal distension EXAM: ABDOMEN - 1 VIEW COMPARISON:  CT abdomen pelvis 04/28/2018 FINDINGS: Air-filled loops of large and small bowel throughout the abdomen. No definite bowel wall thickening. Gas present to at least the distal sigmoid colon. Findings favor ileus. LEFT ureteral stent noted. Osseous structures unremarkable. IMPRESSION: Air-filled loops of large and small bowel favoring ileus. Electronically Signed   By: Ulyses Southward M.D.   On: 04/29/2018 23:20   Dg Abd 1 View  Result Date: 04/28/2018 CLINICAL DATA:  Abdominal swelling. Patient is status post complete hysterectomy, postop day 1. EXAM: ABDOMEN - 1 VIEW COMPARISON:  None. FINDINGS: The bowel gas pattern is normal. No radio-opaque calculi or other significant radiographic abnormality are seen. Surgical clips  are identified in the pelvis. IMPRESSION: No bowel obstruction noted. Electronically Signed   By: Sherian ReinWei-Chen  Lin M.D.   On: 04/28/2018 15:34   [place] Renal  Result Date: 04/28/2018 CLINICAL DATA:  Renal failure EXAM: RENAL / URINARY TRACT ULTRASOUND COMPLETE COMPARISON:  None. FINDINGS: Right Kidney: Length: 8.8 cm. Mildly echogenic right renal parenchyma. No right hydronephrosis. No right renal mass. Left Kidney: Length: 9.6 cm. Mildly echogenic left renal parenchyma. Mild left hydronephrosis. No left renal mass. Bladder: Bladder collapsed by Foley catheter and cannot be evaluated on this scan. IMPRESSION: 1. Mild left hydronephrosis. Left ureteral obstruction cannot be excluded. Further imaging evaluation with hematuria protocol CT abdomen/pelvis without and with IV contrast may be  obtained as clinically warranted. 2. No right hydronephrosis. 3. Mildly echogenic kidneys, indicative of nonspecific renal parenchymal disease of uncertain chronicity. 4. Bladder collapsed by Foley catheter and not evaluated on this scan. Electronically Signed   By: Delbert PhenixJason A Poff M.D.   On: 04/28/2018 14:01   Dg Chest Port 1 View  Result Date: 04/29/2018 CLINICAL DATA:  Central line placement. EXAM: PORTABLE CHEST 1 VIEW COMPARISON:  None. FINDINGS: The lungs are hypoexpanded. Mild left basilar atelectasis is noted. No pleural effusion or pneumothorax is seen. A likely calcified granuloma is noted at the right midlung zone. The cardiomediastinal silhouette is borderline normal in size. No acute osseous abnormalities are identified. The patient's left IJ line is noted ending about the distal SVC. IMPRESSION: 1. Left IJ line noted ending about the distal SVC. No pneumothorax. 2. Lungs hypoexpanded, with mild left basilar atelectasis. Electronically Signed   By: Roanna RaiderJeffery  Chang M.D.   On: 04/29/2018 05:28   Dg C-arm 1-60 Min  Result Date: 04/29/2018 CLINICAL DATA:  Left retrograde pyelogram and left ureteral stent placement EXAM: DG C-ARM 61-120 MIN COMPARISON:  04/28/2018 CT abdomen/pelvis FINDINGS: Fluoroscopy time 0 minutes 23 seconds. Multiple spot fluoroscopic intraoperative radiographs of left urinary tract demonstrate mild left hydroureteronephrosis with placement of a left nephroureteral stent with the proximal pigtail portion overlying the central left renal collecting system. No filling defects are demonstrated within the visualized left urinary tract. IMPRESSION: Intraoperative fluoroscopic guidance for left retrograde pyelogram and left nephroureteral stent placement. Electronically Signed   By: Delbert PhenixJason A Poff M.D.   On: 04/29/2018 07:20     Medications:   . sodium chloride 100 mL/hr at 04/30/18 0728  . piperacillin-tazobactam (ZOSYN)  IV 3.375 g (04/30/18 0548)   . acetaminophen  1,000 mg Oral  Q8H  . albuterol  2.5 mg Nebulization Once  . docusate sodium  100 mg Oral BID  . [START ON 05/01/2018] enoxaparin (LOVENOX) injection  30 mg Subcutaneous Q24H  . famotidine  20 mg Oral BID  . LORazepam  1 mg Intravenous Once  . mouth rinse  15 mL Mouth Rinse BID  . metoCLOPramide (REGLAN) injection  5 mg Intravenous Q6H  . oxybutynin  5 mg Oral TID  . polyethylene glycol  17 g Oral Daily  . sodium chloride flush  10-40 mL Intracatheter Q12H   HYDROmorphone (DILAUDID) injection, menthol-cetylpyridinium, ondansetron **OR** ondansetron (ZOFRAN) IV, oxyCODONE, promethazine, simethicone, sodium chloride flush  Assessment/ Plan:  42 y.o. caucasian female is post op abdominal hysterectomy, left salpingo-oophorectomy, laparoscopic lysis of adhesions and retroperitoneal dissection on 04/28/2018.  Followed by cystoscopy with retrograde pyelogram and left ureteral stent placement on 04/29/2018 Baseline creatinine is 0.79 from 04/19/2018 Post op case complicated by ARF, left hydronephrosis  1. ARF, likely multifactorial from ATN and left hydronephrosis -  Left ureteral stent has been placed - patient is getting CBI, monitor UOP  - repeat renal u/s today  2. Generalized edema - quite severe - set up CVP to assess central venous pressures and help guide volume requirements  S Creatinine is worse today. Also has massive edema.  If UOP is low, and CVP is elevated will consider CRRT for volume and electrolyte control Case discussed with family and Dr Sung Amabile     LOS: 3 Rogena Deupree Thedore Mins 6/10/20198:19 AM  [facility] [place], [place] [phone]  Note: This note was prepared with Dragon dictation. Any transcription errors are unintentional

## 2018-04-30 NOTE — Progress Notes (Signed)
Patient admitted for abnormal uterine s/p lap cystoscopy. Patient ordered lovenox 40 mg subq daily. CrCl 28.4 ml/min   Patient was having some hematuria via cath lovenox was held. Urine appears better; however, considering patient's CrCl will decrease dose to lovenox 30 mg subq daily.  If patient continue to have hematuria may have to hold chemical prophylaxis and use SCDs.  Thomasene Rippleavid Devory Mckinzie, PharmD, BCPS Clinical Pharmacist 04/30/2018

## 2018-04-30 NOTE — Progress Notes (Signed)
Ultrasound results noted to have right hydronephrosis  Discussed case with urologist, Dr Apolinar JunesBrandon  Ureteral orifice was difficult to find on right previously. Left kidney still shows persistent moderate hydronephrosis on left, despite stent placement  At this point, best option might be a right PCN to relieve obstruction   If this does not improve UOP and Creatinine, we may need to start CRRT

## 2018-04-30 NOTE — Progress Notes (Signed)
Patient stable for entire procedure with Neph tube placed per Dr Deanne CofferHassell.after procedure done patient began vomiting coffee ground emesis. Called icu nurse, brought patient back to ICU for recovery,

## 2018-04-30 NOTE — Progress Notes (Signed)
PHARMACY NOTE -  RENAL DOSE ADJUSTMENT   Pharmacy to assist with antibiotic renal dose adjustment.   Patient has been initiated on famotidine 20mg  every 12 hours.   SCr 3.36 , estimated CrCl 24 ml/min  Patients Scr in April was 0.9, Scr now 3.36. Current dosage is not appropriate and needs dose adjusted for current renal function.   Dose has been changed to famotidine 20mg  every 24 hours based on current CrCl <7430ml/min.   Gardner CandleSheema M Romie Keeble, PharmD, BCPS Clinical Pharmacist 04/30/2018 8:35 AM

## 2018-05-01 ENCOUNTER — Other Ambulatory Visit: Payer: BLUE CROSS/BLUE SHIELD

## 2018-05-01 DIAGNOSIS — K567 Ileus, unspecified: Secondary | ICD-10-CM

## 2018-05-01 LAB — CBC
HCT: 28.5 % — ABNORMAL LOW (ref 35.0–47.0)
Hemoglobin: 9.7 g/dL — ABNORMAL LOW (ref 12.0–16.0)
MCH: 30.8 pg (ref 26.0–34.0)
MCHC: 34.1 g/dL (ref 32.0–36.0)
MCV: 90.3 fL (ref 80.0–100.0)
PLATELETS: 97 10*3/uL — AB (ref 150–440)
RBC: 3.16 MIL/uL — AB (ref 3.80–5.20)
RDW: 15.1 % — ABNORMAL HIGH (ref 11.5–14.5)
WBC: 16.1 10*3/uL — AB (ref 3.6–11.0)

## 2018-05-01 LAB — COMPREHENSIVE METABOLIC PANEL
ALBUMIN: 2.8 g/dL — AB (ref 3.5–5.0)
ALT: 11 U/L — AB (ref 14–54)
AST: 35 U/L (ref 15–41)
Alkaline Phosphatase: 103 U/L (ref 38–126)
Anion gap: 15 (ref 5–15)
BUN: 59 mg/dL — AB (ref 6–20)
CHLORIDE: 94 mmol/L — AB (ref 101–111)
CO2: 22 mmol/L (ref 22–32)
CREATININE: 4.15 mg/dL — AB (ref 0.44–1.00)
Calcium: 6.4 mg/dL — CL (ref 8.9–10.3)
GFR calc Af Amer: 14 mL/min — ABNORMAL LOW (ref >60)
GFR, EST NON AFRICAN AMERICAN: 12 mL/min — AB (ref >60)
GLUCOSE: 151 mg/dL — AB (ref 65–99)
POTASSIUM: 4.4 mmol/L (ref 3.5–5.1)
Sodium: 131 mmol/L — ABNORMAL LOW (ref 135–145)
Total Bilirubin: 0.7 mg/dL (ref 0.3–1.2)
Total Protein: 6.1 g/dL — ABNORMAL LOW (ref 6.5–8.1)

## 2018-05-01 LAB — TYPE AND SCREEN
ABO/RH(D): O POS
Antibody Screen: POSITIVE
UNIT DIVISION: 0
UNIT DIVISION: 0

## 2018-05-01 LAB — TROPONIN I: TROPONIN I: 0.15 ng/mL — AB (ref <0.03)

## 2018-05-01 LAB — BPAM RBC
BLOOD PRODUCT EXPIRATION DATE: 201906302359
BLOOD PRODUCT EXPIRATION DATE: 201907042359
UNIT TYPE AND RH: 5100
UNIT TYPE AND RH: 5100

## 2018-05-01 LAB — SURGICAL PATHOLOGY

## 2018-05-01 LAB — PROCALCITONIN: PROCALCITONIN: 1.78 ng/mL

## 2018-05-01 MED ORDER — LIDOCAINE 5 % EX OINT
TOPICAL_OINTMENT | Freq: Two times a day (BID) | CUTANEOUS | Status: DC | PRN
  Filled 2018-05-01: qty 35.44

## 2018-05-01 MED ORDER — SODIUM CHLORIDE 0.45 % IV SOLN
INTRAVENOUS | Status: DC
  Administered 2018-05-01: 10:00:00 via INTRAVENOUS

## 2018-05-01 MED ORDER — PIPERACILLIN-TAZOBACTAM 3.375 G IVPB
3.3750 g | Freq: Two times a day (BID) | INTRAVENOUS | Status: DC
  Administered 2018-05-01 – 2018-05-02 (×2): 3.375 g via INTRAVENOUS
  Filled 2018-05-01 (×2): qty 50

## 2018-05-01 MED ORDER — FUROSEMIDE 10 MG/ML IJ SOLN
80.0000 mg | Freq: Once | INTRAMUSCULAR | Status: AC
  Administered 2018-05-01: 80 mg via INTRAVENOUS
  Filled 2018-05-01: qty 8

## 2018-05-01 MED ORDER — PROCHLORPERAZINE EDISYLATE 10 MG/2ML IJ SOLN
10.0000 mg | Freq: Once | INTRAMUSCULAR | Status: AC
  Administered 2018-05-01: 10 mg via INTRAVENOUS
  Filled 2018-05-01: qty 2

## 2018-05-01 MED ORDER — PANTOPRAZOLE SODIUM 40 MG IV SOLR
40.0000 mg | INTRAVENOUS | Status: DC
  Administered 2018-05-01 – 2018-05-06 (×6): 40 mg via INTRAVENOUS
  Filled 2018-05-01 (×6): qty 40

## 2018-05-01 MED ORDER — METOPROLOL TARTRATE 5 MG/5ML IV SOLN
2.5000 mg | INTRAVENOUS | Status: DC | PRN
  Administered 2018-05-01 (×2): 2.5 mg via INTRAVENOUS
  Administered 2018-05-01 – 2018-05-02 (×2): 5 mg via INTRAVENOUS
  Filled 2018-05-01 (×3): qty 5

## 2018-05-01 NOTE — Progress Notes (Signed)
eLink Physician-Brief Progress Note Patient Name: Jonelle SidleGinger B Cyr DOB: 05/18/1976 MRN: [id number]   Date of Service  05/01/2018  HPI/Events of Note  Nausea - No relief with Zofran or Phenergan.   eICU Interventions  Will order Compazine 10 mg IV now.      Intervention Category Major Interventions: Other:  Lenell AntuSommer,Steven Eugene 05/01/2018, 1:37 AM

## 2018-05-01 NOTE — Progress Notes (Signed)
Case discussed with Dr Thedore MinsSingh and Dr Sung AmabileSimonds Will continue to monitor UOP, creatinine rising 1/2 NS started by Nephrology Will stop ABX and Reglan for now and re-assess Continue SD monitoring

## 2018-05-01 NOTE — Progress Notes (Signed)
3 Days Post-Op  Procedure(s):  1. 04/28/18: ATTEMPTED LAPAROSCOPIC HYSTERECTOMY CONVERTED TO SUPRACERVICAL ABDOMINAL HYSTERECTOMY (N/A) SALPINGO OOPHORECTOMY (Left) CYSTOSCOPY (N/A) UNILATERAL SALPINGECTOMY (Right) LAPAROSCOPIC LYSIS OF ADHESIONS, RETROPERITONEAL DISSECTION  2. 04/29/18: CYSTOSCOPY WITH RETROGRADE PYELOGRAM/Left URETERAL STENT PLACEMENT, ureteroscopy (Bilateral)    Subjective: Pt still in ICU, s/p NG tube placed since yesterday with decreased abdominal distension, not quite as lethargic. Family at bedside, good support.  Hospital course:        - Nausea improved with NG tube placement overnight, but still present.  On zofran and phenergan, which is limiting her ambulation.        - Received 80ml IV lasix x1 per nephrology, to decide about HD today. of urine out in last 14hrs from nephrostomy and foley        - Her hyperkalemia has resolved.  - s/p OR with Dr. Alvester Morin on 04/29/18 from urology for cystoscopy and left pyelogram and left ureteral stent placement for investigation of bladder and ureters after continued low urine output and bloody urine with pelvic pain. Previously, pt did have CT scan without contrast because of acute renal insufficiency to evaluate bladder, and no bladder injury noted. 5cm hematoma in pelvis noted on CT scan, which I would expect given the surgery. She has a left A-line in place. She has a three-way foley that is irrigating her bladder, paused and restarted for dark urine, but paused again to decrease reflux.  - nephrology, who recommended D/C iv fluids due to fluid overload. Creatinine improved to 2.44 (highest 2.58 at 2200 6/8) but now worsened again to 2.90, and now to 4.15. Considering hemodialysis. One dose lasix this morning 2/11 and monitoring for labs and urine output.  - continuing tachycardic in sinus rhythm, now in 120s. 98-100% O2 sats on room air.   - Troponin bumped 0.29 04/30/18, now 0.15 05/01/18  - s/p PCA and po percocet,  nothing currently   - on 5000U of heparin q8 hrs for DVT ppx.  She was started on Zosyn for clinical dx of sepsis with persistent tachycardia, elevated lactic acid 2.5 (4.2) and elevated WBC to 16.1 (down from 17.3 just postop) , 3.375g q8hrs.  WBC 18.3 6/10 WBC 16.1 6/11  H/H remains stable. Platelets stable, up to 97 (87)   Objective: Vital signs in last 24 hours: Temp:  [98 F (36.7 C)-99.2 F (37.3 C)] 98 F (36.7 C) (06/11 0700) Pulse Rate:  [111-150] 127 (06/11 1400) Resp:  [16-25] 23 (06/11 1400) BP: (107-143)/(57-106) 127/87 (06/11 1400) SpO2:  [93 %-100 %] 99 % (06/11 1400) FiO2 (%):  [95 %] 95 % (06/10 1550)  Intake/Output  Intake/Output Summary (Last 24 hours) at 05/01/2018 1408 Last data filed at 05/01/2018 1317 Gross per 24 hour  Intake 7100 ml  Output 8650 ml  Net -1550 ml    Physical Exam:  General: Lethargic, but oriented and responsive, appears ill. CV: Regular rhythm, no murmurs, increased rate Lungs: Decreased breath sounds in bilateral bases, L>R GI: Tense, moderately-distended >than yesterday. Ecchymosis over mons deepening, and lap incisions clean and dry. Minimal bowel sounds.   Incisions: Clean and dry, honeycomb dressing in place. Urine: Pink, Foley in place, irrigation flowing Extremities: Nontender, no erythema, significant edema in all extremities more tense than yesterday, with a bola on left lateral arm at site of prior IV, covered with a dressing. Fingers tensely edematous.  Lab Results: Recent Labs    04/29/18 2226 04/30/18 0628 05/01/18 0533  HGB 10.5* 10.3* 9.7*  HCT  30.4* 30.6* 28.5*  WBC 18.3* 19.7* 16.1*  PLT 87* 99* 97*                 Results for orders placed or performed during the hospital encounter of 04/27/18 (from the past 24 hour(s))  Procalcitonin     Status: None   Collection Time: 05/01/18  5:33 AM  Result Value Ref Range   Procalcitonin 1.78 ng/mL  CBC     Status: Abnormal   Collection Time: 05/01/18  5:33  AM  Result Value Ref Range   WBC 16.1 (H) 3.6 - 11.0 K/uL   RBC 3.16 (L) 3.80 - 5.20 MIL/uL   Hemoglobin 9.7 (L) 12.0 - 16.0 g/dL   HCT 16.1 (L) 09.6 - 04.5 %   MCV 90.3 80.0 - 100.0 fL   MCH 30.8 26.0 - 34.0 pg   MCHC 34.1 32.0 - 36.0 g/dL   RDW 40.9 (H) 81.1 - 91.4 %   Platelets 97 (L) 150 - 440 K/uL  Comprehensive metabolic panel     Status: Abnormal   Collection Time: 05/01/18  5:33 AM  Result Value Ref Range   Sodium 131 (L) 135 - 145 mmol/L   Potassium 4.4 3.5 - 5.1 mmol/L   Chloride 94 (L) 101 - 111 mmol/L   CO2 22 22 - 32 mmol/L   Glucose, Bld 151 (H) 65 - 99 mg/dL   BUN 59 (H) 6 - 20 mg/dL   Creatinine, Ser 7.82 (H) 0.44 - 1.00 mg/dL   Calcium 6.4 (LL) 8.9 - 10.3 mg/dL   Total Protein 6.1 (L) 6.5 - 8.1 g/dL   Albumin 2.8 (L) 3.5 - 5.0 g/dL   AST 35 15 - 41 U/L   ALT 11 (L) 14 - 54 U/L   Alkaline Phosphatase 103 38 - 126 U/L   Total Bilirubin 0.7 0.3 - 1.2 mg/dL   GFR calc non Af Amer 12 (L) >60 mL/min   GFR calc Af Amer 14 (L) >60 mL/min   Anion gap 15 5 - 15  Troponin I     Status: Abnormal   Collection Time: 05/01/18  5:33 AM  Result Value Ref Range   Troponin I 0.15 (HH) <0.03 ng/mL    Assessment/Plan: 3 Days Post-Op        Procedure(s): 1. 04/28/18: ATTEMPTED LAPAROSCOPIC HYSTERECTOMY CONVERTED TO SUPRACERVICAL ABDOMINAL HYSTERECTOMY (N/A) SALPINGO OOPHORECTOMY (Left) CYSTOSCOPY (N/A) UNILATERAL SALPINGECTOMY (Right) LAPAROSCOPIC LYSIS OF ADHESIONS, RETROPERITONEAL DISSECTION   2. 04/29/18: CYSTOSCOPY WITH RETROGRADE PYELOGRAM/Left URETERAL STENT PLACEMENT, ureteroscopy (Bilateral)   Ileus: abdominal distension, sips and chips, now with NG tube  Acute renal insufficiency - managed by nephrology, lasix x1 today, s/p perc neph yesterday on right and has left stent in place. Foley with clear pink urine, much less bloody today. No longer with CBI. Minimal urine output, 25ml overnight,   Hyperkalemia - resolved Leukocytosis - expected. On zosyn. Her  procalcitonin is stable. WBC dropped. No fever  Thrombocytopenia - stable  Hematuria - foley in place. Appreciate urology and nephrology recs.  Pain: s/p PCA. Will restart if needed, pain meds for ambulation with PT when able  Tachycardia - persistent. O2 sats 98-100 on room air.  DVT ppx: Heparin started yesterday. TED and SCDs recommended Acute blood loss anemia, s/p massive transfusion protocol - stable Third-spacing fluid in extremities - improved clinically, still minimal UOP.   When tolerated recommend out of bed to chair and Ambulate. Encourage Incentive spirometry.  - PT consult placed yesterday. Pain meds as  needed to encourage out of bed.  Appreciate urology and nephrology assistance.  Appreciate intensivist involvement for monitoring and treatment.   Christeen DouglasBethany Albertina Leise, MD   LOS: 4 days   Christeen DouglasBethany Jezebel Pollet 05/01/2018, 2:08 PMPatient ID: [name], female   DOB: 09/20/1976, 42 y.o.   MRN: [id number]

## 2018-05-01 NOTE — Progress Notes (Signed)
PT Cancellation Note  Patient Details Name: Jonelle SidleGinger B Bielak MRN: [id number] DOB: 03/09/1976   Cancelled Treatment:    Reason Eval/Treat Not Completed: Medical issues which prohibited therapy(HR as high as 127 at rest ). Per PT protocol, will hold PT until pt more medically appropriate for exertional activity.    Encarnacion ChuAshley Chavie Kolinski PT, DPT 05/01/2018, 2:49 PM

## 2018-05-01 NOTE — Progress Notes (Signed)
PT PROFILE: [age] F with PMH of GERD, DVT, and DUB, underwent total hysterectomy and bilateral laparoscopic salpingectomy 06/07. 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 Developed hematuria and L hydronephrosis 06/09 requiring cystoscopy and left ureteral stent placement. Renal US 6/10 revealed right hydronephrosis. S/P R percutaneous nephrostomy tube placement 6/10   SUBJ: Appears fatigued.  Cognition intact.  Continues to have nausea.  NGT placed yesterday.  Vitals:   05/01/18 1100 05/01/18 1200 05/01/18 1300 05/01/18 1400  BP: 124/78 126/81 126/89 127/87  Pulse: (!) 113 (!) 117 (!) 127 (!) 127  Resp: (!) 22 18 (!) 25 (!) 23  Temp:      TempSrc:      SpO2: 95% 97% 97% 99%  3 LPM [place]  Gen: No overt distress HEENT: NCAT, sclerae white Neck: No JVD noted Lungs: BS diminished, faint bibasilar crackles Cardiovascular: Tachy, regular, no M Abdomen: Distended, nontender, diminished BS Ext: Edema in all 4 extremities, +/- improved Neuro: No focal deficits Skin: Limited exam, no lesions noted  BMP Latest Ref Rng & Units 05/01/2018 04/30/2018 04/30/2018  Glucose 65 - 99 mg/dL 161(W151(H) 960(A164(H) 540(J167(H)  BUN 6 - 20 mg/dL 81(X59(H) 91(Y46(H) 78(G44(H)  Creatinine 0.44 - 1.00 mg/dL 9.56(O4.15(H) 1.30(Q3.71(H) 6.57(Q3.36(H)  Sodium 135 - 145 mmol/L 131(L) 130(L) 132(L)  Potassium 3.5 - 5.1 mmol/L 4.4 4.4 4.2  Chloride 101 - 111 mmol/L 94(L) 94(L) 96(L)  CO2 22 - 32 mmol/L 22 23 23   Calcium 8.9 - 10.3 mg/dL 6.4(LL) 6.4(LL) 6.5(L)    CBC Latest Ref Rng & Units 05/01/2018 04/30/2018 04/29/2018  WBC 3.6 - 11.0 K/uL 16.1(H) 19.7(H) 18.3(H)  Hemoglobin 12.0 - 16.0 g/dL 4.6(N9.7(L) 10.3(L) 10.5(L)  Hematocrit 35.0 - 47.0 % 28.5(L) 30.6(L) 30.4(L)  Platelets 150 - 440 K/uL 97(L) 99(L) 87(L)     Results for orders placed or  performed during the hospital encounter of 04/27/18  MRSA PCR Screening     Status: None   Collection Time: 04/27/18 10:27 PM  Result Value Ref Range Status   MRSA by PCR NEGATIVE NEGATIVE Final    Comment:        The GeneXpert MRSA Assay (FDA approved for NASAL specimens only), is one component of a comprehensive MRSA colonization surveillance program. It is not intended to diagnose MRSA infection nor to guide or monitor treatment for MRSA infections. Performed at [facility], [address] [place], [place] [address]   CULTURE, BLOOD (ROUTINE X 2) w Reflex to ID Panel     Status: None (Preliminary result)   Collection Time: 04/28/18  9:24 PM  Result Value Ref Range Status   Specimen Description BLOOD RIGHT HAND  Final   Special Requests   Final    BOTTLES DRAWN AEROBIC AND ANAEROBIC Blood Culture adequate volume   Culture   Final    NO GROWTH 3 DAYS Performed at [facility], [address] [place], [place] [address]    Report Status PENDING  Incomplete  CULTURE, BLOOD (ROUTINE X 2) w Reflex to ID Panel     Status: None (Preliminary result)   Collection Time: 04/28/18  9:44 PM  Result Value Ref Range Status   Specimen Description BLOOD RIGHT ANTECUBITAL  Final   Special Requests   Final    BOTTLES DRAWN AEROBIC  AND ANAEROBIC Blood Culture adequate volume   Culture   Final    NO GROWTH 3 DAYS Performed at [facility], [address] [place], [place] [address]    Report Status PENDING  Incomplete    Anti-infectives (From admission, onward)   Start     Dose/Rate Route Frequency Ordered Stop   04/30/18 2200  piperacillin-tazobactam (ZOSYN) IVPB 3.375 g     3.375 g 12.5 mL/hr over 240 Minutes Intravenous Every 12 hours 04/30/18 0833     04/29/18 0330  piperacillin-tazobactam (ZOSYN) IVPB 3.375 g  Status:  Discontinued     3.375 g 12.5 mL/hr over 240 Minutes Intravenous Every 8 hours 04/29/18 0317 04/30/18 0833   04/27/18 0615  ceFAZolin  (ANCEF) 2-4 GM/100ML-% IVPB    Note to Pharmacy:  Lorrene Reid   : cabinet override      04/27/18 0615 04/27/18 0746   04/27/18 0600  ceFAZolin (ANCEF) IVPB 2g/100 mL premix     2 g 200 mL/hr over 30 Minutes Intravenous On call to O.R. 04/26/18 2240 04/27/18 1142      CXR: NNF  IMP: S/P hysterectomy 06/07 with intra-abdominal bleeding complications  Status post massive transfusion protocol Severe hematuria on continuous bladder irrigation  Appears to be largely resolved Left hydronephrosis  Status post left ureteral stent Right hydronephrosis  Status post percutaneous nephrostomy tube AKI, oliguric   Trial of furosemide today  Might require HD Hypervolemia, +/- improved today Acute blood loss anemia, not actively bleeding Ileus with nausea Mildly elevated PCT  PLAN/REC: Trial of furosemide today Reassess urine output and chemistry panel later today and decide on HD catheter placement Continue NGT to LIWS Continue antiemetics Continue PIP-tazo   Billy Fischer, MD PCCM service Mobile [phone] Pager [phone] 05/01/2018 2:11 PM

## 2018-05-01 NOTE — Progress Notes (Signed)
Pharmacy Antibiotic Note  [name] is a 42 y.o. female admitted on 04/27/2018 with sepsis.  Pharmacy has been consulted for zosyn dosing.  Plan: Patient's baseline Scr 0.9. Currently Scr is over 3x baseline level at 4.15.Continue Zosyn 3.375 IV EI every 12 hours based on current renal function.     Temp (24hrs), Avg:98.6 F (37 C), Min:98 F (36.7 C), Max:99.2 F (37.3 C)  Recent Labs  Lab 04/28/18 1043  04/28/18 2106  04/29/18 0503 04/29/18 2226 04/30/18 0628 04/30/18 1329 05/01/18 0533  WBC 17.3*  --   --   --  16.1* 18.3* 19.7*  --  16.1*  CREATININE 2.09*   < >  --    < > 2.44* 2.90* 3.36* 3.71* 4.15*  LATICACIDVEN  --   --  4.2*  --  2.5* 1.8  --   --   --    < > = values in this interval not displayed.    Estimated Creatinine Clearance: 19.9 mL/min (A) (by C-G formula based on SCr of 4.15 mg/dL (H)).    No Known Allergies   Thank you for allowing pharmacy to be a part of this patient's care.  Gardner CandleSheema M Abbigal Radich, PharmD, BCPS Clinical Pharmacist 05/01/2018 12:03 PM

## 2018-05-01 NOTE — Progress Notes (Signed)
   05/01/18 1420  Clinical Encounter Type  Visited With Patient and family together;Health care provider  Visit Type Follow-up  Spiritual Encounters  Spiritual Needs Emotional   Chaplain checked in with patient and family.  Conversation around patient's feelings of nausea, she expressed feeling 'horrible.'  Family joked about being present as a 'Arts administratorbaby sitter,' chaplain asked patient if that was accurate.  Patient's communication was more through expressions and some low tones.  She and family acknowledged that she's the 'baby girl' and surrounded by all men.  Chaplain building rapport and offering emotional support.  Patient and family open to ongoing follow up.

## 2018-05-01 NOTE — Progress Notes (Signed)
PT Cancellation Note  Patient Details Name: [name] MRN: [id number] DOB: 03/16/1976   Cancelled Treatment:    Reason Eval/Treat Not Completed: Other (comment): Spoke to nursing who requested to hold PT services this AM secondary to pt with significant nausea at this time.  Will attempt to see pt later this date if possible as medically appropriate.     Ovidio Hanger. Scott Rosealie Reach PT, DPT 05/01/18, 9:07 AM

## 2018-05-01 NOTE — Progress Notes (Signed)
PT Cancellation Note  Patient Details Name: Jonelle SidleGinger B Leitch MRN: [id number] DOB: 09/22/1976   Cancelled Treatment:    Reason Eval/Treat Not Completed: Other (comment): Spoke to nursing who reported that pt continues to c/o of significant nausea.  Nursing recommended holding pt this date and will attempt to see pt tomorrow as medically appropriate.     Lorin Picket. Scott Emrys Mceachron PT, DPT 05/01/18, 1:20 PM

## 2018-05-01 NOTE — Progress Notes (Signed)
Urology Consult Follow Up  Subjective: CBI off.  Status post right nephrostomy tube yesterday.  Urine output does not appear to be accurately recorded, ? 1L.  100 cc in the right nephrostomy bag on exam today.  Status post Lasix.  Anti-infectives: Anti-infectives (From admission, onward)   Start     Dose/Rate Route Frequency Ordered Stop   04/30/18 2200  piperacillin-tazobactam (ZOSYN) IVPB 3.375 g     3.375 g 12.5 mL/hr over 240 Minutes Intravenous Every 12 hours 04/30/18 0833     04/29/18 0330  piperacillin-tazobactam (ZOSYN) IVPB 3.375 g  Status:  Discontinued     3.375 g 12.5 mL/hr over 240 Minutes Intravenous Every 8 hours 04/29/18 0317 04/30/18 0833   04/27/18 0615  ceFAZolin (ANCEF) 2-4 GM/100ML-% IVPB    Note to Pharmacy:  Lorrene Reid   : cabinet override      04/27/18 0615 04/27/18 0746   04/27/18 0600  ceFAZolin (ANCEF) IVPB 2g/100 mL premix     2 g 200 mL/hr over 30 Minutes Intravenous On call to O.R. 04/26/18 2240 04/27/18 1142      Current Facility-Administered Medications  Medication Dose Route Frequency Provider Last Rate Last Dose  . 0.45 % sodium chloride infusion   Intravenous Continuous Mosetta Pigeon, MD 50 mL/hr at 05/01/18 1015    . acetaminophen (TYLENOL) tablet 1,000 mg  1,000 mg Oral Q8H Christeen Douglas, MD   1,000 mg at 04/29/18 1344  . albuterol (PROVENTIL) (2.5 MG/3ML) 0.083% nebulizer solution 2.5 mg  2.5 mg Nebulization Once Ray Church III, MD      . docusate sodium (COLACE) capsule 100 mg  100 mg Oral BID Ray Church III, MD   100 mg at 04/30/18 0925  . heparin injection 5,000 Units  5,000 Units Subcutaneous Q8H Merwyn Katos, MD   5,000 Units at 05/01/18 0531  . HYDROmorphone (DILAUDID) injection 1 mg  1 mg Intravenous Q2H PRN Christeen Douglas, MD   1 mg at 04/30/18 1743  . lidocaine (XYLOCAINE) 5 % ointment   Topical BID PRN Merwyn Katos, MD      . MEDLINE mouth rinse  15 mL Mouth Rinse BID Ray Church III, MD   15 mL at 05/01/18  0759  . menthol-cetylpyridinium (CEPACOL) lozenge 3 mg  1 lozenge Oral Q2H PRN Ray Church III, MD      . metoCLOPramide (REGLAN) injection 5 mg  5 mg Intravenous Q8H Merwyn Katos, MD   5 mg at 05/01/18 0533  . metoprolol tartrate (LOPRESSOR) injection 2.5-5 mg  2.5-5 mg Intravenous Q3H PRN Merwyn Katos, MD   2.5 mg at 05/01/18 0918  . ondansetron (ZOFRAN) injection 4 mg  4 mg Intravenous Q6H PRN Ray Church III, MD   4 mg at 05/01/18 0910  . pantoprazole (PROTONIX) injection 40 mg  40 mg Intravenous Q24H Merwyn Katos, MD      . phenol (CHLORASEPTIC) mouth spray 1 spray  1 spray Mouth/Throat PRN Merwyn Katos, MD      . piperacillin-tazobactam (ZOSYN) IVPB 3.375 g  3.375 g Intravenous Q12H Gardner Candle, RPH 12.5 mL/hr at 05/01/18 0917 3.375 g at 05/01/18 0917  . polyethylene glycol (MIRALAX / GLYCOLAX) packet 17 g  17 g Oral Daily Christeen Douglas, MD   17 g at 04/30/18 0925  . promethazine (PHENERGAN) injection 12.5-25 mg  12.5-25 mg Intravenous Q6H PRN Tukov-Yual, Magdalene S, NP   25 mg at 05/01/18 1238  . sodium  chloride flush (NS) 0.9 % injection 10-40 mL  10-40 mL Intracatheter Q12H Eugenie NorrieBlakeney, Dana G, NP   10 mL at 05/01/18 0919  . sodium chloride flush (NS) 0.9 % injection 10-40 mL  10-40 mL Intracatheter PRN Eugenie NorrieBlakeney, Dana G, NP         Objective: Vital signs in last 24 hours: Temp:  [98 F (36.7 C)-99.2 F (37.3 C)] 98 F (36.7 C) (06/11 0700) Pulse Rate:  [111-150] 113 (06/11 1100) Resp:  [16-24] 22 (06/11 1100) BP: (107-143)/(57-106) 124/78 (06/11 1100) SpO2:  [93 %-100 %] 95 % (06/11 1100) FiO2 (%):  [95 %] 95 % (06/10 1550)  Intake/Output from previous day: 06/10 0701 - 06/11 0700 In: [id number] [I.V.:30] Out: 17570 [Urine:17420; Emesis/NG output:150] Intake/Output this shift: Total I/O In: 100 [IV Piggyback:100] Out: 1000 [Urine:1000]   Physical Exam  Awake, alert and oriented today NGT in place Abdomen distended, appropriately tender Foley  in place draining very light pink urine with CBI OFF Right nephrostomy draining urine.  Diffuse anasarca appreciated   Lab Results:  Recent Labs    04/30/18 0628 05/01/18 0533  WBC 19.7* 16.1*  HGB 10.3* 9.7*  HCT 30.6* 28.5*  PLT 99* 97*   BMET Recent Labs    04/30/18 1329 05/01/18 0533  NA 130* 131*  K 4.4 4.4  CL 94* 94*  CO2 23 22  GLUCOSE 164* 151*  BUN 46* 59*  CREATININE 3.71* 4.15*  CALCIUM 6.4* 6.4*    Studies/Results: Dg Abd 1 View  Result Date: 04/30/2018 CLINICAL DATA:  NG tube placement EXAM: ABDOMEN - 1 VIEW COMPARISON:  04/30/2018 FINDINGS: NG tube tip in the stomach fundus. Diffuse gaseous distention of bowel compatible with distal obstruction or ileus. Left ureteral stent noted and right nephrostomy catheter. Bibasilar atelectasis evident. IMPRESSION: NG tube within the proximal stomach. Electronically Signed   By: Judie PetitM.  Shick M.D.   On: 04/30/2018 17:06   Dg Abd 1 View  Result Date: 04/29/2018 CLINICAL DATA:  Abdominal distension EXAM: ABDOMEN - 1 VIEW COMPARISON:  CT abdomen pelvis 04/28/2018 FINDINGS: Air-filled loops of large and small bowel throughout the abdomen. No definite bowel wall thickening. Gas present to at least the distal sigmoid colon. Findings favor ileus. LEFT ureteral stent noted. Osseous structures unremarkable. IMPRESSION: Air-filled loops of large and small bowel favoring ileus. Electronically Signed   By: Ulyses SouthwardMark  Boles M.D.   On: 04/29/2018 23:20   [place] Renal  Result Date: 04/30/2018 CLINICAL DATA:  Acute renal failure. EXAM: RENAL / URINARY TRACT ULTRASOUND COMPLETE COMPARISON:  CT scan of the abdomen dated 04/28/2018 FINDINGS: Right Kidney: Length: 9.4 cm. New moderate right hydronephrosis. Echogenicity within normal limits. No mass lesions. Left Kidney: Length: 11.5 cm. Moderate left hydronephrosis as demonstrated on the prior CT scan. Echogenicity within normal limits. No mass visualized. Bladder: Foley catheter in place.  Air obscured  the bladder. IMPRESSION: 1. New moderate right hydronephrosis since the prior CT scan of 04/28/2018. 2. Persistent moderate left hydronephrosis, unchanged. Electronically Signed   By: Francene BoyersJames  Maxwell M.D.   On: 04/30/2018 10:27   Dg Abd Portable 1v  Result Date: 04/30/2018 CLINICAL DATA:  NG tube placement EXAM: PORTABLE ABDOMEN - 1 VIEW COMPARISON:  None. FINDINGS: Nasogastric tube is coiled within the lower esophagus, tip directed superiorly. Distended gas-filled small bowel loops noted in the LEFT upper quadrant and RIGHT lower quadrant. Percutaneous RIGHT-sided nephrostomy tube in place. Presumed LEFT-sided nephroureteral stent in place, incompletely imaged. IMPRESSION: 1. Nasogastric tube is coiled within the  lower esophagus with tip directed upwards in the esophagus. Recommend advancing into the stomach. 2. Distended gas-filled small bowel within the upper abdomen and RIGHT lower quadrant, consistent with partial obstruction versus ileus. These results will be called to the ordering clinician or representative by the Radiologist Assistant, and communication documented in the PACS or zVision Dashboard. Electronically Signed   By: Bary Richard M.D.   On: 04/30/2018 17:07   Ir Nephrostomy Placement Right  Result Date: 04/30/2018 INDICATION: Acute renal failure. Thermal bladder injury with hematuria requiring continuous bladder irrigation. Left double-J ureteral stent in place. Ultrasound demonstrates right hydronephrosis. EXAM: CT GUIDED RIGHT PERCUTANEOUS NEPHROSTOMY CATHETER PLACEMENT MEDICATIONS: The patient is currently admitted to the hospital and receiving intravenous antibiotics. The antibiotics were administered within an appropriate time frame prior to the initiation of the procedure. ANESTHESIA/SEDATION: Intravenous Fentanyl and Versed were administered as conscious sedation during continuous monitoring of the patient's level of consciousness and physiological / cardiorespiratory status by the  radiology RN, with a total moderate sedation time of 20 minutes. COMPLICATIONS: None immediate. TECHNIQUE: Informed written consent was obtained from the spouse after a thorough discussion of the procedural risks, benefits and alternatives. All questions were addressed. Maximal Sterile Barrier Technique was utilized including caps, mask, sterile gowns, sterile gloves, sterile drape, hand hygiene and skin antiseptic. A timeout was performed prior to the initiation of the procedure. PROCEDURE: Patient placed in LAO position. Select axial scans through the mid abdomen obtained. An appropriate skin entry site was determined and marked. The operative field was prepped with chlorhexidine in a sterile fashion, and a sterile drape was applied covering the operative field. A sterile gown and sterile gloves were used for the procedure. Local anesthesia was provided with 1% Lidocaine. Under CT fluoroscopic guidance, 18 gauge trocar needle advanced into the right renal collecting system. Urine spontaneously returned through the hub. Amplatz guidewire advanced easily into the renal collecting system, position confirmed on CT. Tract dilated to facilitate placement of a 10 French pigtail drain catheter, positioned coiled within the renal collecting system, confirmed on CT. No hemorrhage or other apparent complication. Catheter secured externally with 0 Prolene suture and StatLock, and placed to external gravity drain bag. The patient tolerated the procedure well. FINDINGS: CT confirmed mild right hydronephrosis with retained contrast in the renal collecting system. 10 French pigtail catheter placed as nephrostomy tube as above under CT guidance. IMPRESSION: 1. Technically successful right percutaneous nephrostomy catheter placement under CT guidance. Electronically Signed   By: Corlis Leak M.D.   On: 04/30/2018 16:19   Ct Image Guided Drainage By Percutaneous Catheter  Result Date: 04/30/2018 INDICATION: Acute renal failure.  Thermal bladder injury with hematuria requiring continuous bladder irrigation. Left double-J ureteral stent in place. Ultrasound demonstrates right hydronephrosis. EXAM: CT GUIDED RIGHT PERCUTANEOUS NEPHROSTOMY CATHETER PLACEMENT MEDICATIONS: The patient is currently admitted to the hospital and receiving intravenous antibiotics. The antibiotics were administered within an appropriate time frame prior to the initiation of the procedure. ANESTHESIA/SEDATION: Intravenous Fentanyl and Versed were administered as conscious sedation during continuous monitoring of the patient's level of consciousness and physiological / cardiorespiratory status by the radiology RN, with a total moderate sedation time of 20 minutes. COMPLICATIONS: None immediate. TECHNIQUE: Informed written consent was obtained from the spouse after a thorough discussion of the procedural risks, benefits and alternatives. All questions were addressed. Maximal Sterile Barrier Technique was utilized including caps, mask, sterile gowns, sterile gloves, sterile drape, hand hygiene and skin antiseptic. A timeout was performed prior to  the initiation of the procedure. PROCEDURE: Patient placed in LAO position. Select axial scans through the mid abdomen obtained. An appropriate skin entry site was determined and marked. The operative field was prepped with chlorhexidine in a sterile fashion, and a sterile drape was applied covering the operative field. A sterile gown and sterile gloves were used for the procedure. Local anesthesia was provided with 1% Lidocaine. Under CT fluoroscopic guidance, 18 gauge trocar needle advanced into the right renal collecting system. Urine spontaneously returned through the hub. Amplatz guidewire advanced easily into the renal collecting system, position confirmed on CT. Tract dilated to facilitate placement of a 10 French pigtail drain catheter, positioned coiled within the renal collecting system, confirmed on CT. No hemorrhage  or other apparent complication. Catheter secured externally with 0 Prolene suture and StatLock, and placed to external gravity drain bag. The patient tolerated the procedure well. FINDINGS: CT confirmed mild right hydronephrosis with retained contrast in the renal collecting system. 10 French pigtail catheter placed as nephrostomy tube as above under CT guidance. IMPRESSION: 1. Technically successful right percutaneous nephrostomy catheter placement under CT guidance. Electronically Signed   By: Corlis Leak M.D.   On: 04/30/2018 16:19   Renal ultrasound personally reviewed.  Assessment/ Plan:  1.  Gross hematuria-   Minimal.  Please keep CBI off for now.  Flush catheter as needed.  2.  Left hydronephrosis- status post ureteral stent.  Hydronephrosis on renal ultrasound today performed in the setting of full bladder on CBI, suspect reflux is contributing to this.  3. Right hydronephrosis-status post right nephrostomy tube.  4.  Acute kidney injury-likely multifactorial with maximal urinary drainage at this time.  Nephrology following.  Monitor urine output..    LOS: 4 days    Vanna Scotland 05/01/2018

## 2018-05-01 NOTE — Progress Notes (Signed)
Pt still suffering with constant Nausea without relief with PRN Zofran and Phenergran. MD made aware, order placed for 10 mg of Compazine, administered. Brought patient some relief for awhile then continued with other PRNs when time to give. Pt also mentioned feeling anxious, relayed to MD, no new orders at this time. Continuous Bladder Irrigation continued without complication.

## 2018-05-01 NOTE — Progress Notes (Signed)
[facility], [place] 05/01/18  Subjective:   Patient underwent abdominal hysterectomy, left salpingo-oophorectomy, laparoscopic lysis of adhesions and retroperitoneal dissection on 04/28/2018.  Followed by cystoscopy with retrograde pyelogram and left ureteral stent placement on 04/29/2018 Baseline creatinine is 0.79 from 04/19/2018 Patient has developed postoperative acute renal failure, left hydronephrosis for which left ureteral stent was placed on June 9;  Rt hydronephrosis noted on renal U/S; Rt nephrostomy tube placed 9/10 Minimal UOP from rt nephrostomy- Greenish urine and large amount of dark sediment noted in nephrostomy tube Patient is getting CBI. Accurate UOP not available S Creatinine is worse today Patient reported nausea after nephrostomy- now has NG tube Continues to have large amount of generalized edema;   CVP 3-6 cm   Objective:  Vital signs in last 24 hours:  Temp:  [98 F (36.7 C)-99.2 F (37.3 C)] 98 F (36.7 C) (06/11 0700) Pulse Rate:  [111-150] 122 (06/11 0800) Resp:  [16-24] 23 (06/11 0800) BP: (107-136)/(57-106) 126/88 (06/11 0800) SpO2:  [93 %-100 %] 95 % (06/11 0800) FiO2 (%):  [95 %] 95 % (06/10 1550)  Weight change:  There were no vitals filed for this visit.  Intake/Output:    Intake/Output Summary (Last 24 hours) at 05/01/2018 0911 Last data filed at 05/01/2018 0700 Gross per 24 hour  Intake 60454 ml  Output 9820 ml  Net 2210 ml     Physical Exam: General: Ill appearing, laying in bed  HEENT Dry oral mucus membranes  Neck supple  Pulm/lungs Decreased breath sounds at bases  CVS/Heart Tachycardic, regular  Abdomen:  Distended, sluggish bowel sounds  Extremities: +++ tight edema  Neurologic: Alert, oriented  Skin: No acute rashes   Foley in place       Basic Metabolic Panel:  Recent Labs  Lab 04/29/18 0503 04/29/18 2226 04/30/18 0628 04/30/18 1329 05/01/18 0533  NA 141 133* 132* 130* 131*  K 4.0  4.2 4.2 4.4 4.4  CL 106 96* 96* 94* 94*  CO2 24 24 23 23 22   GLUCOSE 196* 165* 167* 164* 151*  BUN 31* 36* 44* 46* 59*  CREATININE 2.44* 2.90* 3.36* 3.71* 4.15*  CALCIUM 6.6* 6.5* 6.5* 6.4* 6.4*  MG  --  1.4* 2.7*  --   --   PHOS  --  4.2  --   --   --      CBC: Recent Labs  Lab 04/28/18 1043 04/29/18 0503 04/29/18 2226 04/30/18 0628 05/01/18 0533  WBC 17.3* 16.1* 18.3* 19.7* 16.1*  NEUTROABS  --  14.5*  --   --   --   HGB 11.7* 10.6* 10.5* 10.3* 9.7*  HCT 34.9* 31.1* 30.4* 30.6* 28.5*  MCV 89.7 90.2 89.0 89.8 90.3  PLT 145* 105* 87* 99* 97*     No results found for: HEPBSAG, HEPBSAB, HEPBIGM    Microbiology:  Recent Results (from the past 240 hour(s))  MRSA PCR Screening     Status: None   Collection Time: 04/27/18 10:27 PM  Result Value Ref Range Status   MRSA by PCR NEGATIVE NEGATIVE Final    Comment:        The GeneXpert MRSA Assay (FDA approved for NASAL specimens only), is one component of a comprehensive MRSA colonization surveillance program. It is not intended to diagnose MRSA infection nor to guide or monitor treatment for MRSA infections. Performed at [facility], [address] [place], [place] [address]   CULTURE, BLOOD (ROUTINE X 2) w Reflex to ID Panel  Status: None (Preliminary result)   Collection Time: 04/28/18  9:24 PM  Result Value Ref Range Status   Specimen Description BLOOD RIGHT HAND  Final   Special Requests   Final    BOTTLES DRAWN AEROBIC AND ANAEROBIC Blood Culture adequate volume   Culture   Final    NO GROWTH 3 DAYS Performed at [facility], [address], [place], [place] [address]    Report Status PENDING  Incomplete  CULTURE, BLOOD (ROUTINE X 2) w Reflex to ID Panel     Status: None (Preliminary result)   Collection Time: 04/28/18  9:44 PM  Result Value Ref Range Status   Specimen Description BLOOD RIGHT ANTECUBITAL  Final   Special Requests   Final    BOTTLES DRAWN AEROBIC AND ANAEROBIC  Blood Culture adequate volume   Culture   Final    NO GROWTH 3 DAYS Performed at [facility], [address], [place], [place] [address]    Report Status PENDING  Incomplete    Coagulation Studies: No results for input(s): LABPROT, INR in the last 72 hours.  Urinalysis: No results for input(s): COLORURINE, LABSPEC, PHURINE, GLUCOSEU, HGBUR, BILIRUBINUR, KETONESUR, PROTEINUR, UROBILINOGEN, NITRITE, LEUKOCYTESUR in the last 72 hours.  Invalid input(s): APPERANCEUR    Imaging: Dg Abd 1 View  Result Date: 04/30/2018 CLINICAL DATA:  NG tube placement EXAM: ABDOMEN - 1 VIEW COMPARISON:  04/30/2018 FINDINGS: NG tube tip in the stomach fundus. Diffuse gaseous distention of bowel compatible with distal obstruction or ileus. Left ureteral stent noted and right nephrostomy catheter. Bibasilar atelectasis evident. IMPRESSION: NG tube within the proximal stomach. Electronically Signed   By: Judie Petit.  Shick M.D.   On: 04/30/2018 17:06   Dg Abd 1 View  Result Date: 04/29/2018 CLINICAL DATA:  Abdominal distension EXAM: ABDOMEN - 1 VIEW COMPARISON:  CT abdomen pelvis 04/28/2018 FINDINGS: Air-filled loops of large and small bowel throughout the abdomen. No definite bowel wall thickening. Gas present to at least the distal sigmoid colon. Findings favor ileus. LEFT ureteral stent noted. Osseous structures unremarkable. IMPRESSION: Air-filled loops of large and small bowel favoring ileus. Electronically Signed   By: Ulyses Southward M.D.   On: 04/29/2018 23:20   US Renal  Result Date: 04/30/2018 CLINICAL DATA:  Acute renal failure. EXAM: RENAL / URINARY TRACT ULTRASOUND COMPLETE COMPARISON:  CT scan of the abdomen dated 04/28/2018 FINDINGS: Right Kidney: Length: 9.4 cm. New moderate right hydronephrosis. Echogenicity within normal limits. No mass lesions. Left Kidney: Length: 11.5 cm. Moderate left hydronephrosis as demonstrated on the prior CT scan. Echogenicity within normal limits. No mass visualized.  Bladder: Foley catheter in place.  Air obscured the bladder. IMPRESSION: 1. New moderate right hydronephrosis since the prior CT scan of 04/28/2018. 2. Persistent moderate left hydronephrosis, unchanged. Electronically Signed   By: Francene Boyers M.D.   On: 04/30/2018 10:27   Dg Abd Portable 1v  Result Date: 04/30/2018 CLINICAL DATA:  NG tube placement EXAM: PORTABLE ABDOMEN - 1 VIEW COMPARISON:  None. FINDINGS: Nasogastric tube is coiled within the lower esophagus, tip directed superiorly. Distended gas-filled small bowel loops noted in the LEFT upper quadrant and RIGHT lower quadrant. Percutaneous RIGHT-sided nephrostomy tube in place. Presumed LEFT-sided nephroureteral stent in place, incompletely imaged. IMPRESSION: 1. Nasogastric tube is coiled within the lower esophagus with tip directed upwards in the esophagus. Recommend advancing into the stomach. 2. Distended gas-filled small bowel within the upper abdomen and RIGHT lower quadrant, consistent with partial obstruction versus ileus. These results  will be called to the ordering clinician or representative by the Radiologist Assistant, and communication documented in the PACS or zVision Dashboard. Electronically Signed   By: Bary Richard M.D.   On: 04/30/2018 17:07   Ir Nephrostomy Placement Right  Result Date: 04/30/2018 INDICATION: Acute renal failure. Thermal bladder injury with hematuria requiring continuous bladder irrigation. Left double-J ureteral stent in place. Ultrasound demonstrates right hydronephrosis. EXAM: CT GUIDED RIGHT PERCUTANEOUS NEPHROSTOMY CATHETER PLACEMENT MEDICATIONS: The patient is currently admitted to the hospital and receiving intravenous antibiotics. The antibiotics were administered within an appropriate time frame prior to the initiation of the procedure. ANESTHESIA/SEDATION: Intravenous Fentanyl and Versed were administered as conscious sedation during continuous monitoring of the patient's level of consciousness and  physiological / cardiorespiratory status by the radiology RN, with a total moderate sedation time of 20 minutes. COMPLICATIONS: None immediate. TECHNIQUE: Informed written consent was obtained from the spouse after a thorough discussion of the procedural risks, benefits and alternatives. All questions were addressed. Maximal Sterile Barrier Technique was utilized including caps, mask, sterile gowns, sterile gloves, sterile drape, hand hygiene and skin antiseptic. A timeout was performed prior to the initiation of the procedure. PROCEDURE: Patient placed in LAO position. Select axial scans through the mid abdomen obtained. An appropriate skin entry site was determined and marked. The operative field was prepped with chlorhexidine in a sterile fashion, and a sterile drape was applied covering the operative field. A sterile gown and sterile gloves were used for the procedure. Local anesthesia was provided with 1% Lidocaine. Under CT fluoroscopic guidance, 18 gauge trocar needle advanced into the right renal collecting system. Urine spontaneously returned through the hub. Amplatz guidewire advanced easily into the renal collecting system, position confirmed on CT. Tract dilated to facilitate placement of a 10 French pigtail drain catheter, positioned coiled within the renal collecting system, confirmed on CT. No hemorrhage or other apparent complication. Catheter secured externally with 0 Prolene suture and StatLock, and placed to external gravity drain bag. The patient tolerated the procedure well. FINDINGS: CT confirmed mild right hydronephrosis with retained contrast in the renal collecting system. 10 French pigtail catheter placed as nephrostomy tube as above under CT guidance. IMPRESSION: 1. Technically successful right percutaneous nephrostomy catheter placement under CT guidance. Electronically Signed   By: Corlis Leak M.D.   On: 04/30/2018 16:19   Ct Image Guided Drainage By Percutaneous Catheter  Result  Date: 04/30/2018 INDICATION: Acute renal failure. Thermal bladder injury with hematuria requiring continuous bladder irrigation. Left double-J ureteral stent in place. Ultrasound demonstrates right hydronephrosis. EXAM: CT GUIDED RIGHT PERCUTANEOUS NEPHROSTOMY CATHETER PLACEMENT MEDICATIONS: The patient is currently admitted to the hospital and receiving intravenous antibiotics. The antibiotics were administered within an appropriate time frame prior to the initiation of the procedure. ANESTHESIA/SEDATION: Intravenous Fentanyl and Versed were administered as conscious sedation during continuous monitoring of the patient's level of consciousness and physiological / cardiorespiratory status by the radiology RN, with a total moderate sedation time of 20 minutes. COMPLICATIONS: None immediate. TECHNIQUE: Informed written consent was obtained from the spouse after a thorough discussion of the procedural risks, benefits and alternatives. All questions were addressed. Maximal Sterile Barrier Technique was utilized including caps, mask, sterile gowns, sterile gloves, sterile drape, hand hygiene and skin antiseptic. A timeout was performed prior to the initiation of the procedure. PROCEDURE: Patient placed in LAO position. Select axial scans through the mid abdomen obtained. An appropriate skin entry site was determined and marked. The operative field was prepped with chlorhexidine  in a sterile fashion, and a sterile drape was applied covering the operative field. A sterile gown and sterile gloves were used for the procedure. Local anesthesia was provided with 1% Lidocaine. Under CT fluoroscopic guidance, 18 gauge trocar needle advanced into the right renal collecting system. Urine spontaneously returned through the hub. Amplatz guidewire advanced easily into the renal collecting system, position confirmed on CT. Tract dilated to facilitate placement of a 10 French pigtail drain catheter, positioned coiled within the renal  collecting system, confirmed on CT. No hemorrhage or other apparent complication. Catheter secured externally with 0 Prolene suture and StatLock, and placed to external gravity drain bag. The patient tolerated the procedure well. FINDINGS: CT confirmed mild right hydronephrosis with retained contrast in the renal collecting system. 10 French pigtail catheter placed as nephrostomy tube as above under CT guidance. IMPRESSION: 1. Technically successful right percutaneous nephrostomy catheter placement under CT guidance. Electronically Signed   By: D  Hassell M.D.   On: 04/30/2018 16:19     Medications:   . sodium chCorlis Leakloride    . piperacillin-tazobactam (ZOSYN)  IV Stopped (05/01/18 0230)   . acetaminophen  1,000 mg Oral Q8H  . albuterol  2.5 mg Nebulization Once  . docusate sodium  100 mg Oral BID  . furosemide  80 mg Intravenous Once  . heparin injection (subcutaneous)  5,000 Units Subcutaneous Q8H  . mouth rinse  15 mL Mouth Rinse BID  . metoCLOPramide (REGLAN) injection  5 mg Intravenous Q8H  . pantoprazole (PROTONIX) IV  40 mg Intravenous Q24H  . polyethylene glycol  17 g Oral Daily  . sodium chloride flush  10-40 mL Intracatheter Q12H   HYDROmorphone (DILAUDID) injection, menthol-cetylpyridinium, metoprolol tartrate, [DISCONTINUED] ondansetron **OR** ondansetron (ZOFRAN) IV, phenol, promethazine, sodium chloride flush  Assessment/ Plan:  42 y.o. caucasian female is post op abdominal hysterectomy, left salpingo-oophorectomy, laparoscopic lysis of adhesions and retroperitoneal dissection on 04/28/2018.  Followed by cystoscopy with retrograde pyelogram and left ureteral stent placement on 04/29/2018 Baseline creatinine is 0.79 from 04/19/2018 Post op case complicated by ARF, b/l  hydronephrosis  1. ARF, likely multifactorial from severe ATN and b/l hydronephrosis. - Left ureteral stent in place - rt nephrostomy placed 6/10 - patient is getting CBI, some pink tinge in the urine noted. Continue  to monitor UOP  - S Creatinine is worse again today despite above interventions. Patient also continues to have massive generalized edema.  She is currently on room air. Electrolytes are acceptable. No acute indication to start CRRT but will have a low threshold of starting HD . Agree with trial of 1 dose of Lasix  2. Generalized edema - quite severe - CVP 3-6 cm; will start low dose 1/2 NS      LOS: 4 Kj Imbert 6/11/20199:11 AM  [facility] [place], [place] [phone]  Note: This note was prepared with Dragon dictation. Any transcription errors are unintentional

## 2018-05-02 ENCOUNTER — Inpatient Hospital Stay: Payer: BLUE CROSS/BLUE SHIELD

## 2018-05-02 DIAGNOSIS — Z90710 Acquired absence of both cervix and uterus: Secondary | ICD-10-CM

## 2018-05-02 DIAGNOSIS — R34 Anuria and oliguria: Secondary | ICD-10-CM

## 2018-05-02 LAB — BASIC METABOLIC PANEL
ANION GAP: 17 — AB (ref 5–15)
BUN: 81 mg/dL — ABNORMAL HIGH (ref 6–20)
CALCIUM: 6.5 mg/dL — AB (ref 8.9–10.3)
CO2: 21 mmol/L — ABNORMAL LOW (ref 22–32)
CREATININE: 4.99 mg/dL — AB (ref 0.44–1.00)
Chloride: 95 mmol/L — ABNORMAL LOW (ref 101–111)
GFR calc Af Amer: 11 mL/min — ABNORMAL LOW (ref >60)
GFR calc non Af Amer: 10 mL/min — ABNORMAL LOW (ref >60)
Glucose, Bld: 141 mg/dL — ABNORMAL HIGH (ref 65–99)
Potassium: 4.7 mmol/L (ref 3.5–5.1)
SODIUM: 133 mmol/L — AB (ref 135–145)

## 2018-05-02 LAB — PROCALCITONIN: PROCALCITONIN: 1.92 ng/mL

## 2018-05-02 LAB — CBC
HCT: 26.9 % — ABNORMAL LOW (ref 35.0–47.0)
HEMOGLOBIN: 9.2 g/dL — AB (ref 12.0–16.0)
MCH: 30.9 pg (ref 26.0–34.0)
MCHC: 34.3 g/dL (ref 32.0–36.0)
MCV: 90.1 fL (ref 80.0–100.0)
PLATELETS: 132 10*3/uL — AB (ref 150–440)
RBC: 2.99 MIL/uL — AB (ref 3.80–5.20)
RDW: 14.8 % — ABNORMAL HIGH (ref 11.5–14.5)
WBC: 18.6 10*3/uL — AB (ref 3.6–11.0)

## 2018-05-02 MED ORDER — IOPAMIDOL (ISOVUE-300) INJECTION 61%
15.0000 mL | INTRAVENOUS | Status: AC
  Administered 2018-05-02 (×2): 15 mL via ORAL

## 2018-05-02 MED ORDER — HYDROMORPHONE HCL 1 MG/ML IJ SOLN
1.0000 mg | INTRAMUSCULAR | Status: DC | PRN
  Administered 2018-05-02 – 2018-05-03 (×5): 1 mg via INTRAVENOUS
  Filled 2018-05-02 (×5): qty 1

## 2018-05-02 MED ORDER — FUROSEMIDE 10 MG/ML IJ SOLN
120.0000 mg | Freq: Four times a day (QID) | INTRAVENOUS | Status: AC
  Administered 2018-05-02 (×2): 120 mg via INTRAVENOUS
  Filled 2018-05-02 (×2): qty 10

## 2018-05-02 MED ORDER — ALBUMIN HUMAN 25 % IV SOLN
25.0000 g | Freq: Four times a day (QID) | INTRAVENOUS | Status: AC
  Administered 2018-05-02 (×3): 25 g via INTRAVENOUS
  Filled 2018-05-02 (×3): qty 100

## 2018-05-02 MED ORDER — SODIUM CHLORIDE 0.9 % IV SOLN
500.0000 mg | Freq: Two times a day (BID) | INTRAVENOUS | Status: DC
  Administered 2018-05-02 – 2018-05-06 (×9): 500 mg via INTRAVENOUS
  Filled 2018-05-02 (×4): qty 500
  Filled 2018-05-02: qty 0.5
  Filled 2018-05-02 (×3): qty 500
  Filled 2018-05-02: qty 0.5
  Filled 2018-05-02: qty 500
  Filled 2018-05-02: qty 0.5

## 2018-05-02 NOTE — Evaluation (Signed)
Physical Therapy Evaluation Patient Details Name: [name] MRN: [id number] DOB: 08/22/1976 Today's Date: 05/02/2018   History of Present Illness  Pt is a 42 y/o F s/p laparascopic hysterectomy, Bil aslpingectomy 6/7.  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  Developed hematuria and L hydronephrosis 06/09 requiring cystoscopy and left ureteral stent placement. Renal US 6/10 revealed right hydronephrosis. S/P R percutaneous nephrostomy tube placement 6/10.  NG tube placed 6/10.  Pt with AKI and ileus.  Pt's PMH includes DVT.     Clinical Impression  Pt admitted with above diagnosis. Pt currently with functional limitations due to the deficits listed below (see PT Problem List). Ms. [name] was very pleasant and agreeable to therapy.  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  She lives with a very supportive husband and two children and reports she will have 24/7 assist/superivion available at d/c from family and friends.  The pt was lethargic during the session (pain medication likely a contributor) but put forth good effort.  Completed BUE and BLE therapeutic exercises in supine.  Pt with L IJ, thus deferred additional mobility at this time. Anticipate that once L IJ is removed and the pt is able to progress mobility that she will progress well.  Recommending CIR at d/c. Pt will benefit from skilled PT to increase their independence and safety with mobility to allow discharge to the venue listed below.      Follow Up Recommendations CIR    Equipment Recommendations  Other (comment)(TBD as pt progresses)    Recommendations for Other Services Rehab consult     Precautions / Restrictions Precautions Precautions: Fall;Other (comment) Precaution Comments: ureteral drain, nephrostomy, NG tube, L IJ line Restrictions Weight Bearing Restrictions: No      Mobility  Bed Mobility               General bed mobility comments: Deferred as pt with L IJ and significant generalized weakness  Transfers                    Ambulation/Gait                Stairs            Wheelchair Mobility    Modified Rankin (Stroke Patients Only)       Balance                                             Pertinent Vitals/Pain Pain Assessment: Faces Faces Pain Scale: Hurts little more Pain Location: "all over" generalized achiness(pt reports this is from laying in bed for so long) Pain Descriptors / Indicators: Aching Pain Intervention(s): Limited activity within patient's tolerance;Monitored during session;Repositioned    Home Living Family/patient expects to be discharged to:: Private residence Living Arrangements: Spouse/significant other;Children Available Help at Discharge: Family;Available 24 hours/day;Friend(s) Type of Home: House Home Access: Stairs to enter Entrance Stairs-Rails: Right;Left;Can reach both Entrance Stairs-Number of Steps: 4 Home Layout: One level Home Equipment: None      Prior Function Level of Independence: Independent         Comments: Pt was independent with all aspects of mobility and all ADLs, IADLs.  Does not use AD to ambulate.      Hand Dominance  Dominant Hand: Right    Extremity/Trunk Assessment                Communication   Communication: No difficulties  Cognition Arousal/Alertness: Lethargic;Suspect due to medications Behavior During Therapy: [facility] for tasks assessed/performed Overall Cognitive Status: Within Functional Limits for tasks assessed                                 General Comments: Pt lethargic with pain medication likely contributing      General Comments General comments (skin integrity, edema, etc.): Husband present throughout session and was educated on how to assist pt with BLE therapeutic exercises.   Instructed husband not to assist with BUE exercises (pt with L IJ) to avoid complications.  Husband verbalized understanding and was very engaged.   HR up to 107 at rest, up to 112 with activity. Otherwise, VSS.     Exercises General Exercises - Lower Extremity Ankle Circles/Pumps: AROM;Both;10 reps;Supine Quad Sets: Strengthening;Both;10 reps;Supine Heel Slides: AAROM;Both;10 reps;Supine Hip ABduction/ADduction: AAROM;Both;10 reps;Supine Straight Leg Raises: AAROM;Both;10 reps;Supine Other Exercises Other Exercises: Pt instructed in performing AROM Bil wrist circles x15 and making fist and opening hand x15.   Other Exercises: Bil elbow flexion and extension with light manual resistance x10 each UE.    Assessment/Plan    PT Assessment Patient needs continued PT services  PT Problem List Decreased strength;Decreased range of motion;Decreased activity tolerance;Decreased balance;Decreased mobility;Decreased knowledge of use of DME;Decreased safety awareness;Pain       PT Treatment Interventions DME instruction;Gait training;Stair training;Functional mobility training;Therapeutic activities;Therapeutic exercise;Balance training;Neuromuscular re-education;Patient/family education;Wheelchair mobility training;Modalities    PT Goals (Current goals can be found in the Care Plan section)  Acute Rehab PT Goals Patient Stated Goal: to return to PLOF PT Goal Formulation: With patient Time For Goal Achievement: 05/16/18 Potential to Achieve Goals: Good    Frequency 7X/week   Barriers to discharge        Co-evaluation               AM-PAC PT "6 Clicks" Daily Activity  Outcome Measure Difficulty turning over in bed (including adjusting bedclothes, sheets and blankets)?: Unable Difficulty moving from lying on back to sitting on the side of the bed? : Unable Difficulty sitting down on and standing up from a chair with arms (e.g., wheelchair, bedside commode, etc,.)?: Unable Help  needed moving to and from a bed to chair (including a wheelchair)?: Total Help needed walking in hospital room?: Total Help needed climbing 3-5 steps with a railing? : Total 6 Click Score: 6    End of Session   Activity Tolerance: Patient limited by fatigue;Patient limited by lethargy Patient left: in bed;with call bell/phone within reach;with family/visitor present;Other (comment)(in upright position (legs remained elevated to avoid swellin) Nurse Communication: Mobility status PT Visit Diagnosis: Muscle weakness (generalized) (M62.81);Difficulty in walking, not elsewhere classified (R26.2)    Time: 1610-9604 PT Time Calculation (min) (ACUTE ONLY): 32 min   Charges:   PT Evaluation $PT Eval High Complexity: 1 High PT Treatments $Therapeutic Exercise: 8-22 mins   PT G Codes:        Encarnacion Chu PT, DPT 05/02/2018, 11:54 AM

## 2018-05-02 NOTE — Care Management (Signed)
Per RN, patient is more alert today and less nausea. Renal is being closely watched. Lasix challenge; CT with contrast of abdomen pending. Ileus concern; NG in place. PT following. CIR will see request and I expect will follow patients ability to tolerate 3-4 hours of PT without fatigue.

## 2018-05-02 NOTE — Progress Notes (Signed)
Patient ID: [name], female   DOB: 10-29-1976, 42 y.o.   MRN: [id number] 4 Days Post-Op  Procedure(s):  1. 04/28/18: ATTEMPTED LAPAROSCOPIC HYSTERECTOMY CONVERTED TO SUPRACERVICAL ABDOMINAL HYSTERECTOMY (N/A) SALPINGO OOPHORECTOMY (Left) CYSTOSCOPY (N/A) UNILATERAL SALPINGECTOMY (Right) LAPAROSCOPIC LYSIS OF ADHESIONS, RETROPERITONEAL DISSECTION  2. 04/29/18: CYSTOSCOPY WITH RETROGRADE PYELOGRAM/Left URETERAL STENT PLACEMENT, ureteroscopy (Bilateral)    Subjective: Pt still in ICU, s/p NG tube placed since 6/10 with decreased abdominal distension, not quite as lethargic. Family at bedside, good support.  Hospital course:     - CT scan today, no blood loss in pelvis, unchanged 5cm hematoma, no mention of infection    - Nausea improved with NG tube placement overnight, and now resolved.        - Received 80ml IV lasix x1 per nephrology, to decide about HD today. of urine out in last 14hrs from nephrostomy and foley        - Her hyperkalemia has resolved.  - s/p OR with Dr. Alvester Morin on 04/29/18 from urology for cystoscopy and left pyelogram and left ureteral stent placement for investigation of bladder and ureters after continued low urine output and bloody urine with pelvic pain. Previously, pt did have CT scan without contrast because of acute renal insufficiency to evaluate bladder, and no bladder injury noted. 5cm hematoma in pelvis noted on CT scan, which I would expect given the surgery. She has a left A-line in place. She has a three-way foley that is irrigating her bladder, paused and restarted for dark urine, but paused again to decrease reflux.  - nephrology, who recommended D/C iv fluids due to fluid overload. Creatinine improved to 2.44 (highest 2.58 at 2200 6/8) but now worsened again to 2.90, and now to 4.15. Considering hemodialysis. One dose lasix this morning 2/11 and monitoring for labs and urine output.  - continuing tachycardic in sinus rhythm, now in 120s. 98-100% O2  sats on room air.   - Troponin bumped 0.29 04/30/18, now 0.15 05/01/18  - s/p PCA and po percocet, nothing currently   - on 5000U of heparin q8 hrs for DVT ppx.  She was started on Zosyn for clinical dx of sepsis with persistent tachycardia, elevated lactic acid 2.5 (4.2) and elevated WBC to 16.1 (down from 17.3 just postop) , 3.375g q8hrs.  WBC 18.3 6/10 WBC 16.1 6/11 Her WBC bump today prompted to broaden the spectrum, on meropenum.   H/H remains stable. Platelets stable, up to >100 (97) (87)   Objective: Vital signs in last 24 hours: Temp:  [97.8 F (36.6 C)-98.8 F (37.1 C)] 97.8 F (36.6 C) (06/12 0800) Pulse Rate:  [97-122] 108 (06/12 0800) Resp:  [15-22] 18 (06/12 0800) BP: (102-119)/(54-79) 118/76 (06/12 0800) SpO2:  [92 %-98 %] 97 % (06/12 0800)  Intake/Output  Intake/Output Summary (Last 24 hours) at 05/02/2018 1414 Last data filed at 05/02/2018 1411 Gross per 24 hour  Intake 1517.51 ml  Output 2665 ml  Net -1147.49 ml    Physical Exam:  General: Lethargic, but oriented and responsive, appears ill. CV: Regular rhythm, no murmurs, increased rate Lungs: Decreased breath sounds in bilateral bases, L>R GI: Tense, moderately-distended >than yesterday. Ecchymosis over mons deepening, and lap incisions clean and dry. Minimal bowel sounds.   Incisions: Clean and dry, honeycomb dressing in place. Urine: Pink, Foley in place, irrigation flowing Extremities: Nontender, no erythema, significant edema in all extremities more tense than yesterday, with a bola on left lateral arm at site of prior IV, covered with  a dressing. Fingers tensely edematous.  Lab Results: Recent Labs    04/30/18 0628 05/01/18 0533 05/02/18 0402  HGB 10.3* 9.7* 9.2*  HCT 30.6* 28.5* 26.9*  WBC 19.7* 16.1* 18.6*  PLT 99* 97* 132*                 Results for orders placed or performed during the hospital encounter of 04/27/18 (from the past 24 hour(s))  Basic metabolic panel     Status:  Abnormal   Collection Time: 05/02/18  4:02 AM  Result Value Ref Range   Sodium 133 (L) 135 - 145 mmol/L   Potassium 4.7 3.5 - 5.1 mmol/L   Chloride 95 (L) 101 - 111 mmol/L   CO2 21 (L) 22 - 32 mmol/L   Glucose, Bld 141 (H) 65 - 99 mg/dL   BUN 81 (H) 6 - 20 mg/dL   Creatinine, Ser 1.614.99 (H) 0.44 - 1.00 mg/dL   Calcium 6.5 (L) 8.9 - 10.3 mg/dL   GFR calc non Af Amer 10 (L) >60 mL/min   GFR calc Af Amer 11 (L) >60 mL/min   Anion gap 17 (H) 5 - 15  CBC     Status: Abnormal   Collection Time: 05/02/18  4:02 AM  Result Value Ref Range   WBC 18.6 (H) 3.6 - 11.0 K/uL   RBC 2.99 (L) 3.80 - 5.20 MIL/uL   Hemoglobin 9.2 (L) 12.0 - 16.0 g/dL   HCT 09.626.9 (L) 04.535.0 - 40.947.0 %   MCV 90.1 80.0 - 100.0 fL   MCH 30.9 26.0 - 34.0 pg   MCHC 34.3 32.0 - 36.0 g/dL   RDW 81.114.8 (H) 91.411.5 - 78.214.5 %   Platelets 132 (L) 150 - 440 K/uL  Procalcitonin     Status: None   Collection Time: 05/02/18  4:02 AM  Result Value Ref Range   Procalcitonin 1.92 ng/mL    Assessment/Plan: 4 Days Post-Op        Procedure(s): 1. 04/28/18: ATTEMPTED LAPAROSCOPIC HYSTERECTOMY CONVERTED TO SUPRACERVICAL ABDOMINAL HYSTERECTOMY (N/A) SALPINGO OOPHORECTOMY (Left) CYSTOSCOPY (N/A) UNILATERAL SALPINGECTOMY (Right) LAPAROSCOPIC LYSIS OF ADHESIONS, RETROPERITONEAL DISSECTION   2. 04/29/18: CYSTOSCOPY WITH RETROGRADE PYELOGRAM/Left URETERAL STENT PLACEMENT, ureteroscopy (Bilateral)   Ileus: abdominal distension, sips and chips, now with NG tube  Acute renal insufficiency - managed by nephrology, lasix x1 today, s/p perc neph yesterday on right and has left stent in place. Foley with clear pink urine, much less bloody today. No longer with CBI. Minimal urine output, 25ml overnight, retry lasix today  Hyperkalemia - resolved Leukocytosis - expected. S/p zosyn. Her procalcitonin raised slightly and WBC rise slightly, now on broader spectrum. No fever  Thrombocytopenia - stable  Hematuria - foley in place. Appreciate urology and  nephrology recs.  Pain: s/p PCA. Will restart if needed, pain meds for ambulation with PT when able  Tachycardia - persistent. O2 sats 98-100 on room air.  DVT ppx: Heparin started yesterday. TED and SCDs recommended Acute blood loss anemia, s/p massive transfusion protocol - stable Third-spacing fluid in extremities - improved clinically, still minimal UOP.   When tolerated recommend out of bed to chair and Ambulate. Encourage Incentive spirometry.  -Working with PT Pain meds as needed to encourage out of bed.  Appreciate urology and nephrology assistance.  Appreciate intensivist involvement for monitoring and treatment.   Christeen DouglasBethany Emaad Nanna, MD   LOS: 5 days   Christeen DouglasBethany Paige Vanderwoude 05/02/2018, 2:14 PMPatient ID: Amy SidleGinger B Sulkowski, female   DOB: 09/24/1976, 42 y.o.  MRN: [id number]

## 2018-05-02 NOTE — Progress Notes (Signed)
Pharmacy Antibiotic Note  [name] is a 42 y.o. female admitted on 04/27/2018 with sepsis.  Pharmacy has been consulted for zosyn dosing.  6/12 Zosyn discontinued. Pharmacy now consulted for meropenem .   Plan: Patient's baseline Scr 0.9, Scr today is 4.99.   Based on current CrCl <6925mL/min will start patient on Meropenem 500mg  IV every 12 hours.   Temp (24hrs), Avg:98.3 F (36.8 C), Min:97.8 F (36.6 C), Max:98.8 F (37.1 C)  Recent Labs  Lab 04/28/18 2106  04/29/18 0503 04/29/18 2226 04/30/18 0628 04/30/18 1329 05/01/18 0533 05/02/18 0402  WBC  --   --  16.1* 18.3* 19.7*  --  16.1* 18.6*  CREATININE  --    < > 2.44* 2.90* 3.36* 3.71* 4.15* 4.99*  LATICACIDVEN 4.2*  --  2.5* 1.8  --   --   --   --    < > = values in this interval not displayed.    Estimated Creatinine Clearance: 16.5 mL/min (A) (by C-G formula based on SCr of 4.99 mg/dL (H)).    No Known Allergies   Thank you for allowing pharmacy to be a part of this patient's care.  Gardner CandleSheema M Bjorn Hallas, PharmD, BCPS Clinical Pharmacist 05/02/2018 9:11 AM

## 2018-05-02 NOTE — Progress Notes (Signed)
[facility] [place], [place] 05/02/18  Subjective:   Patient underwent abdominal hysterectomy, left salpingo-oophorectomy, laparoscopic lysis of adhesions and retroperitoneal dissection on 04/28/2018.  Followed by cystoscopy with retrograde pyelogram and left ureteral stent placement on 04/29/2018 Baseline creatinine is 0.79 from 04/19/2018 Patient has developed postoperative acute renal failure, left hydronephrosis for which left ureteral stent was placed on June 9;  Rt hydronephrosis noted on renal U/S; Rt nephrostomy tube placed 9/10 Minimal UOP from rt nephrostomy- Greenish urine  CBI stopped; blood tinged small amount of urine in the foley S Creatinine is worse today Patient reported nausea after nephrostomy- now has NG tube Continues to have large amount of generalized edema;   CVP ~7 cm   Objective:  Vital signs in last 24 hours:  Temp:  [97.8 F (36.6 C)-98.8 F (37.1 C)] 97.8 F (36.6 C) (06/12 0800) Pulse Rate:  [97-122] 111 (06/12 1500) Resp:  [15-26] 26 (06/12 1500) BP: (102-127)/(54-92) 123/92 (06/12 1500) SpO2:  [92 %-98 %] 97 % (06/12 1500)  Weight change:  There were no vitals filed for this visit.  Intake/Output:    Intake/Output Summary (Last 24 hours) at 05/02/2018 1526 Last data filed at 05/02/2018 1500 Gross per 24 hour  Intake 1617.51 ml  Output 4205 ml  Net -2587.49 ml     Physical Exam: General: Ill appearing, laying in bed  HEENT Dry oral mucus membranes  Neck supple  Pulm/lungs Decreased breath sounds at bases  CVS/Heart Tachycardic, regular  Abdomen:  Distended, sluggish bowel sounds  Extremities: +++ tight edema  Neurologic: Alert, oriented  Skin: No acute rashes   Foley in place, rt nephrostomy       Basic Metabolic Panel:  Recent Labs  Lab 04/29/18 2226 04/30/18 0628 04/30/18 1329 05/01/18 0533 05/02/18 0402  NA 133* 132* 130* 131* 133*  K 4.2 4.2 4.4 4.4 4.7  CL 96* 96* 94* 94* 95*  CO2 24 23 23 22  21*   GLUCOSE 165* 167* 164* 151* 141*  BUN 36* 44* 46* 59* 81*  CREATININE 2.90* 3.36* 3.71* 4.15* 4.99*  CALCIUM 6.5* 6.5* 6.4* 6.4* 6.5*  MG 1.4* 2.7*  --   --   --   PHOS 4.2  --   --   --   --      CBC: Recent Labs  Lab 04/29/18 0503 04/29/18 2226 04/30/18 0628 05/01/18 0533 05/02/18 0402  WBC 16.1* 18.3* 19.7* 16.1* 18.6*  NEUTROABS 14.5*  --   --   --   --   HGB 10.6* 10.5* 10.3* 9.7* 9.2*  HCT 31.1* 30.4* 30.6* 28.5* 26.9*  MCV 90.2 89.0 89.8 90.3 90.1  PLT 105* 87* 99* 97* 132*     No results found for: HEPBSAG, HEPBSAB, HEPBIGM    Microbiology:  Recent Results (from the past 240 hour(s))  MRSA PCR Screening     Status: None   Collection Time: 04/27/18 10:27 PM  Result Value Ref Range Status   MRSA by PCR NEGATIVE NEGATIVE Final    Comment:        The GeneXpert MRSA Assay (FDA approved for NASAL specimens only), is one component of a comprehensive MRSA colonization surveillance program. It is not intended to diagnose MRSA infection nor to guide or monitor treatment for MRSA infections. Performed at [facility], [address] [place], [place] [address]   CULTURE, BLOOD (ROUTINE X 2) w Reflex to ID Panel     Status: None (Preliminary result)   Collection Time: 04/28/18  9:24 PM  Result Value Ref Range Status   Specimen Description BLOOD RIGHT HAND  Final   Special Requests   Final    BOTTLES DRAWN AEROBIC AND ANAEROBIC Blood Culture adequate volume   Culture   Final    NO GROWTH 4 DAYS Performed at [facility], [address] [place], [place] [address]    Report Status PENDING  Incomplete  CULTURE, BLOOD (ROUTINE X 2) w Reflex to ID Panel     Status: None (Preliminary result)   Collection Time: 04/28/18  9:44 PM  Result Value Ref Range Status   Specimen Description BLOOD RIGHT ANTECUBITAL  Final   Special Requests   Final    BOTTLES DRAWN AEROBIC AND ANAEROBIC Blood Culture adequate volume   Culture   Final    NO GROWTH 4  DAYS Performed at [facility], [address], [place], [place] [address]    Report Status PENDING  Incomplete    Coagulation Studies: No results for input(s): LABPROT, INR in the last 72 hours.  Urinalysis: No results for input(s): COLORURINE, LABSPEC, PHURINE, GLUCOSEU, HGBUR, BILIRUBINUR, KETONESUR, PROTEINUR, UROBILINOGEN, NITRITE, LEUKOCYTESUR in the last 72 hours.  Invalid input(s): APPERANCEUR    Imaging: Ct Abdomen Pelvis Wo Contrast  Result Date: 05/02/2018 CLINICAL DATA:  Bowel obstruction. Recent total hysterectomy complicated by hemorrhagic shock. EXAM: CT ABDOMEN AND PELVIS WITHOUT CONTRAST TECHNIQUE: Multidetector CT imaging of the abdomen and pelvis was performed following the standard protocol without IV contrast. COMPARISON:  CT abdomen pelvis dated April 28, 2018. FINDINGS: Lower chest: Right greater than left basilar atelectasis. Hepatobiliary: No focal liver abnormality is seen. No gallstones, gallbladder wall thickening, or biliary dilatation. Pancreas: Unremarkable. No pancreatic ductal dilatation or surrounding inflammatory changes. Spleen: Normal in size without focal abnormality. Adrenals/Urinary Tract: The adrenal glands are unremarkable. Interval placement of a right percutaneous nephrostomy tube with the tip in the proximal ureter. No right hydronephrosis. Interval placement of a left double-J ureteral stent, with unchanged mild left hydronephrosis. The bladder is decompressed by Foley catheter. Stomach/Bowel: Distended, contrast filled distal esophagus and stomach. Enteric tube within the gastric body. Moderately dilated loops of proximal small bowel, with gradual transition to nondilated small bowel in the left abdomen. Distal small bowel loops are decompressed. The colon is unremarkable. Vascular/Lymphatic: No enlarged abdominal or pelvic lymph nodes. Prominent bilateral pelvic varices again noted. Reproductive: Status post hysterectomy. Relatively unchanged  5.0 x 4.8 cm hematoma within the hysterectomy bed. Other: Mild presacral inflammatory changes, similar to prior study. Postsurgical changes and resolving subcutaneous emphysema in the lower abdominal wall. New moderate anasarca. Small amount of free fluid in the left paracolic gutter and pelvis. No pneumoperitoneum. Musculoskeletal: No acute or significant osseous findings. IMPRESSION: 1. Moderately dilated loops of proximal small bowel with gradual transition to nondilated small bowel in the left abdomen. Findings are favored to reflect ileus versus partial small bowel obstruction. 2. Persistent mild left hydronephrosis status post left double-J ureteral stent placement. 3. Resolved right hydronephrosis status post right percutaneous nephrostomy tube placement. 4. Unchanged 5.0 cm hematoma within the hysterectomy bed. 5. New moderate anasarca. Electronically Signed   By: Obie Dredge M.D.   On: 05/02/2018 12:18   Dg Abd 1 View  Result Date: 04/30/2018 CLINICAL DATA:  NG tube placement EXAM: ABDOMEN - 1 VIEW COMPARISON:  04/30/2018 FINDINGS: NG tube tip in the stomach fundus. Diffuse gaseous distention of bowel compatible with distal obstruction or ileus. Left ureteral stent noted and right nephrostomy catheter. Bibasilar  atelectasis evident. IMPRESSION: NG tube within the proximal stomach. Electronically Signed   By: Judie PetitM.  Shick M.D.   On: 04/30/2018 17:06   Dg Abd Portable 1v  Result Date: 04/30/2018 CLINICAL DATA:  NG tube placement EXAM: PORTABLE ABDOMEN - 1 VIEW COMPARISON:  None. FINDINGS: Nasogastric tube is coiled within the lower esophagus, tip directed superiorly. Distended gas-filled small bowel loops noted in the LEFT upper quadrant and RIGHT lower quadrant. Percutaneous RIGHT-sided nephrostomy tube in place. Presumed LEFT-sided nephroureteral stent in place, incompletely imaged. IMPRESSION: 1. Nasogastric tube is coiled within the lower esophagus with tip directed upwards in the esophagus.  Recommend advancing into the stomach. 2. Distended gas-filled small bowel within the upper abdomen and RIGHT lower quadrant, consistent with partial obstruction versus ileus. These results will be called to the ordering clinician or representative by the Radiologist Assistant, and communication documented in the PACS or zVision Dashboard. Electronically Signed   By: Bary RichardStan  Maynard M.D.   On: 04/30/2018 17:07   Ir Nephrostomy Placement Right  Result Date: 04/30/2018 INDICATION: Acute renal failure. Thermal bladder injury with hematuria requiring continuous bladder irrigation. Left double-J ureteral stent in place. Ultrasound demonstrates right hydronephrosis. EXAM: CT GUIDED RIGHT PERCUTANEOUS NEPHROSTOMY CATHETER PLACEMENT MEDICATIONS: The patient is currently admitted to the hospital and receiving intravenous antibiotics. The antibiotics were administered within an appropriate time frame prior to the initiation of the procedure. ANESTHESIA/SEDATION: Intravenous Fentanyl and Versed were administered as conscious sedation during continuous monitoring of the patient's level of consciousness and physiological / cardiorespiratory status by the radiology RN, with a total moderate sedation time of 20 minutes. COMPLICATIONS: None immediate. TECHNIQUE: Informed written consent was obtained from the spouse after a thorough discussion of the procedural risks, benefits and alternatives. All questions were addressed. Maximal Sterile Barrier Technique was utilized including caps, mask, sterile gowns, sterile gloves, sterile drape, hand hygiene and skin antiseptic. A timeout was performed prior to the initiation of the procedure. PROCEDURE: Patient placed in LAO position. Select axial scans through the mid abdomen obtained. An appropriate skin entry site was determined and marked. The operative field was prepped with chlorhexidine in a sterile fashion, and a sterile drape was applied covering the operative field. A sterile  gown and sterile gloves were used for the procedure. Local anesthesia was provided with 1% Lidocaine. Under CT fluoroscopic guidance, 18 gauge trocar needle advanced into the right renal collecting system. Urine spontaneously returned through the hub. Amplatz guidewire advanced easily into the renal collecting system, position confirmed on CT. Tract dilated to facilitate placement of a 10 French pigtail drain catheter, positioned coiled within the renal collecting system, confirmed on CT. No hemorrhage or other apparent complication. Catheter secured externally with 0 Prolene suture and StatLock, and placed to external gravity drain bag. The patient tolerated the procedure well. FINDINGS: CT confirmed mild right hydronephrosis with retained contrast in the renal collecting system. 10 French pigtail catheter placed as nephrostomy tube as above under CT guidance. IMPRESSION: 1. Technically successful right percutaneous nephrostomy catheter placement under CT guidance. Electronically Signed   By: Corlis Leak  Hassell M.D.   On: 04/30/2018 16:19   Ct Image Guided Drainage By Percutaneous Catheter  Result Date: 04/30/2018 INDICATION: Acute renal failure. Thermal bladder injury with hematuria requiring continuous bladder irrigation. Left double-J ureteral stent in place. Ultrasound demonstrates right hydronephrosis. EXAM: CT GUIDED RIGHT PERCUTANEOUS NEPHROSTOMY CATHETER PLACEMENT MEDICATIONS: The patient is currently admitted to the hospital and receiving intravenous antibiotics. The antibiotics were administered within an appropriate time frame  prior to the initiation of the procedure. ANESTHESIA/SEDATION: Intravenous Fentanyl and Versed were administered as conscious sedation during continuous monitoring of the patient's level of consciousness and physiological / cardiorespiratory status by the radiology RN, with a total moderate sedation time of 20 minutes. COMPLICATIONS: None immediate. TECHNIQUE: Informed written consent  was obtained from the spouse after a thorough discussion of the procedural risks, benefits and alternatives. All questions were addressed. Maximal Sterile Barrier Technique was utilized including caps, mask, sterile gowns, sterile gloves, sterile drape, hand hygiene and skin antiseptic. A timeout was performed prior to the initiation of the procedure. PROCEDURE: Patient placed in LAO position. Select axial scans through the mid abdomen obtained. An appropriate skin entry site was determined and marked. The operative field was prepped with chlorhexidine in a sterile fashion, and a sterile drape was applied covering the operative field. A sterile gown and sterile gloves were used for the procedure. Local anesthesia was provided with 1% Lidocaine. Under CT fluoroscopic guidance, 18 gauge trocar needle advanced into the right renal collecting system. Urine spontaneously returned through the hub. Amplatz guidewire advanced easily into the renal collecting system, position confirmed on CT. Tract dilated to facilitate placement of a 10 French pigtail drain catheter, positioned coiled within the renal collecting system, confirmed on CT. No hemorrhage or other apparent complication. Catheter secured externally with 0 Prolene suture and StatLock, and placed to external gravity drain bag. The patient tolerated the procedure well. FINDINGS: CT confirmed mild right hydronephrosis with retained contrast in the renal collecting system. 10 French pigtail catheter placed as nephrostomy tube as above under CT guidance. IMPRESSION: 1. Technically successful right percutaneous nephrostomy catheter placement under CT guidance. Electronically Signed   By: Corlis Leak M.D.   On: 04/30/2018 16:19     Medications:   . albumin human 25 g (05/02/18 1200)  . furosemide Stopped (05/02/18 1210)  . meropenem (MERREM) IV Stopped (05/02/18 1130)   . albuterol  2.5 mg Nebulization Once  . docusate sodium  100 mg Oral BID  . heparin  injection (subcutaneous)  5,000 Units Subcutaneous Q8H  . mouth rinse  15 mL Mouth Rinse BID  . pantoprazole (PROTONIX) IV  40 mg Intravenous Q24H  . polyethylene glycol  17 g Oral Daily  . sodium chloride flush  10-40 mL Intracatheter Q12H   HYDROmorphone (DILAUDID) injection, lidocaine, menthol-cetylpyridinium, metoprolol tartrate, [DISCONTINUED] ondansetron **OR** ondansetron (ZOFRAN) IV, phenol, promethazine, sodium chloride flush  Assessment/ Plan:  42 y.o. caucasian female is post op abdominal hysterectomy, left salpingo-oophorectomy, laparoscopic lysis of adhesions and retroperitoneal dissection on 04/28/2018.  Followed by cystoscopy with retrograde pyelogram and left ureteral stent placement on 04/29/2018 Baseline creatinine is 0.79 from 04/19/2018 Post op case complicated by ARF, b/l  hydronephrosis  1. ARF, likely multifactorial from severe ATN and b/l hydronephrosis. - Left ureteral stent in place - rt nephrostomy placed 6/10 - some pink tinge in the urine noted. Nephrostomy output is low at 300 cc last 24 hrs.    Continue to monitor UOP  - S Creatinine is worse again today despite above interventions. Patient also continues to have massive generalized edema.  She is currently on room air. Electrolytes are acceptable. No acute indication to start CRRT/IHD but will have a low threshold of starting HD . Trial of lasix - no improvement  2. Generalized edema - quite severe - does not appear to affecting respiratory status - will continue to monitor closely      LOS: 5 Narada Uzzle Thedore Mins 6/12/20193:26  PM  [facility] [address] [place], [place] [phone]  Note: This note was prepared with Dragon dictation. Any transcription errors are unintentional

## 2018-05-02 NOTE — Progress Notes (Signed)
PT PROFILE: 72 F with PMH of GERD, DVT, and DUB, underwent total hysterectomy and bilateral laparoscopic salpingectomy 06/07. 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 Developed hematuria and L hydronephrosis 06/09 requiring cystoscopy and left ureteral stent placement. Renal US 6/10 revealed right hydronephrosis. S/P R percutaneous nephrostomy tube placement 6/10   SUBJ: Looks and feels better today.  Denies dyspnea.  Abdominal pain improved.  No new complaints.  Nausea is essentially resolved  Vitals:   05/02/18 0500 05/02/18 0600 05/02/18 0700 05/02/18 0800  BP: 111/74 108/74 111/68 118/76  Pulse: 97 99 (!) 102 (!) 108  Resp: 20 19 15 18   Temp:    97.8 F (36.6 C)  TempSrc:    Axillary  SpO2: 98% 96% 93% 97%  2 LPM [place]  Gen: No overt distress HEENT: NCAT, sclerae white Neck: JVP cannot be visualized Lungs: Clear anteriorly Cardiovascular: Tachycardia much improved, regular, no murmur Abdomen: Less distended, softer, few bowel sounds, nontender Ext: Edema in all extremities, improved Neuro: No focal neurologic deficits   BMP Latest Ref Rng & Units 05/02/2018 05/01/2018 04/30/2018  Glucose 65 - 99 mg/dL 161(W) 960(A) 540(J)  BUN 6 - 20 mg/dL 81(X) 91(Y) 78(G)  Creatinine 0.44 - 1.00 mg/dL 9.56(O) 1.30(Q) 6.57(Q)  Sodium 135 - 145 mmol/L 133(L) 131(L) 130(L)  Potassium 3.5 - 5.1 mmol/L 4.7 4.4 4.4  Chloride 101 - 111 mmol/L 95(L) 94(L) 94(L)  CO2 22 - 32 mmol/L 21(L) 22 23  Calcium 8.9 - 10.3 mg/dL 4.6(N) 6.4(LL) 6.4(LL)    CBC Latest Ref Rng & Units 05/02/2018 05/01/2018 04/30/2018  WBC 3.6 - 11.0 K/uL 18.6(H) 16.1(H) 19.7(H)  Hemoglobin 12.0 - 16.0 g/dL 6.2(X) 5.2(W) 10.3(L)  Hematocrit 35.0 - 47.0 % 26.9(L) 28.5(L) 30.6(L)  Platelets 150 - 440 K/uL 132(L) 97(L)  99(L)     Results for orders placed or performed during the hospital encounter of 04/27/18  MRSA PCR Screening     Status: None   Collection Time: 04/27/18 10:27 PM  Result Value Ref Range Status   MRSA by PCR NEGATIVE NEGATIVE Final    Comment:        The GeneXpert MRSA Assay (FDA approved for NASAL specimens only), is one component of a comprehensive MRSA colonization surveillance program. It is not intended to diagnose MRSA infection nor to guide or monitor treatment for MRSA infections. Performed at [facility], [address] [place], [place] [address]   CULTURE, BLOOD (ROUTINE X 2) w Reflex to ID Panel     Status: None (Preliminary result)   Collection Time: 04/28/18  9:24 PM  Result Value Ref Range Status   Specimen Description BLOOD RIGHT HAND  Final   Special Requests   Final    BOTTLES DRAWN AEROBIC AND ANAEROBIC Blood Culture adequate volume   Culture   Final    NO GROWTH 4 DAYS Performed at [facility], [address] [place], [place] [address]    Report Status PENDING  Incomplete  CULTURE, BLOOD (ROUTINE X 2) w Reflex to ID Panel     Status: None (Preliminary result)   Collection Time: 04/28/18  9:44 PM  Result Value Ref Range Status   Specimen Description BLOOD RIGHT ANTECUBITAL  Final   Special Requests   Final    BOTTLES  DRAWN AEROBIC AND ANAEROBIC Blood Culture adequate volume   Culture   Final    NO GROWTH 4 DAYS Performed at [facility], [address] [place], [place] [address]    Report Status PENDING  Incomplete    Anti-infectives (From admission, onward)   Start     Dose/Rate Route Frequency Ordered Stop   05/02/18 1000  meropenem (MERREM) 500 mg in sodium chloride 0.9 % 100 mL IVPB     500 mg 200 mL/hr over 30 Minutes Intravenous Every 12 hours 05/02/18 0911     05/01/18 2200  piperacillin-tazobactam (ZOSYN) IVPB 3.375 g  Status:  Discontinued     3.375 g 12.5 mL/hr over 240 Minutes Intravenous Every 12  hours 05/01/18 1952 05/02/18 0838   04/30/18 2200  piperacillin-tazobactam (ZOSYN) IVPB 3.375 g  Status:  Discontinued     3.375 g 12.5 mL/hr over 240 Minutes Intravenous Every 12 hours 04/30/18 0833 05/01/18 1509   04/29/18 0330  piperacillin-tazobactam (ZOSYN) IVPB 3.375 g  Status:  Discontinued     3.375 g 12.5 mL/hr over 240 Minutes Intravenous Every 8 hours 04/29/18 0317 04/30/18 0833   04/27/18 0615  ceFAZolin (ANCEF) 2-4 GM/100ML-% IVPB    Note to Pharmacy:  Lorrene ReidJackson, Pamela   : cabinet override      04/27/18 0615 04/27/18 0746   04/27/18 0600  ceFAZolin (ANCEF) IVPB 2g/100 mL premix     2 g 200 mL/hr over 30 Minutes Intravenous On call to O.R. 04/26/18 2240 04/27/18 1142      CXR: NNF  IMP: S/P hysterectomy 06/07 with intra-abdominal bleeding complications  Status post massive transfusion protocol Severe hematuria - resolved Left hydronephrosis - Status post left ureteral stent Right hydronephrosis - Status post percutaneous nephrostomy tube AKI, oligo-anuric   Repeat trial of furosemide today with albumin  Still might require HD Hypervolemia, seems improved today but unclear why since urine output has been so scant Acute blood loss anemia, not actively bleeding Ileus with nausea, improving Mildly elevated PCT  PLAN/REC: Repeat trial of furosemide today with albumin CT abdomen and pelvis today Change Zosyn to meropenem Discontinue NGT Clear liquids as tolerated Continue antiemetics   Billy Fischeravid Simonds, MD PCCM service Mobile [phone] Pager [phone] 05/02/2018 11:51 AM

## 2018-05-02 NOTE — Progress Notes (Signed)
Patient alert and oriented, assisted 25% in movement in bed by turning and holding on to bed rail, pain medication given prior to physical therapy to encourage more participation, CHG bath given and new sheets placed on bed. CBI noted off during assessment at by another provider. 3000 intake through CBI and 3150 output from foley for the shift. Nephrostomy output for the shift 200.

## 2018-05-02 NOTE — Progress Notes (Signed)
Rehab Admissions Coordinator Note:  Patient was screened by Clois DupesBoyette, Quinnie Barcelo Godwin for appropriateness for an Inpatient Acute Rehab Ad[facility], per PT recommendation, but full current functional level not yet assessed.  At this time, we are recommending await progress with PT and OT to assist in determing rehab venue options. I will follow.Ottie Glazier.  Chamya Hunton, RN, MSN Rehab Admissions Coordinator [phone] 05/02/2018 4:01 PM

## 2018-05-03 ENCOUNTER — Inpatient Hospital Stay: Payer: BLUE CROSS/BLUE SHIELD

## 2018-05-03 LAB — COMPREHENSIVE METABOLIC PANEL
ALT: 10 U/L — AB (ref 14–54)
ANION GAP: 19 — AB (ref 5–15)
AST: 36 U/L (ref 15–41)
Albumin: 4 g/dL (ref 3.5–5.0)
Alkaline Phosphatase: 96 U/L (ref 38–126)
BUN: 87 mg/dL — ABNORMAL HIGH (ref 6–20)
CALCIUM: 6.7 mg/dL — AB (ref 8.9–10.3)
CHLORIDE: 91 mmol/L — AB (ref 101–111)
CO2: 20 mmol/L — ABNORMAL LOW (ref 22–32)
CREATININE: 5.3 mg/dL — AB (ref 0.44–1.00)
GFR, EST AFRICAN AMERICAN: 11 mL/min — AB (ref >60)
GFR, EST NON AFRICAN AMERICAN: 9 mL/min — AB (ref >60)
Glucose, Bld: 120 mg/dL — ABNORMAL HIGH (ref 65–99)
Potassium: 4.2 mmol/L (ref 3.5–5.1)
Sodium: 130 mmol/L — ABNORMAL LOW (ref 135–145)
Total Bilirubin: 1.1 mg/dL (ref 0.3–1.2)
Total Protein: 7 g/dL (ref 6.5–8.1)

## 2018-05-03 LAB — CULTURE, BLOOD (ROUTINE X 2)
Culture: NO GROWTH
Culture: NO GROWTH
Special Requests: ADEQUATE
Special Requests: ADEQUATE

## 2018-05-03 LAB — CBC
HCT: 23.4 % — ABNORMAL LOW (ref 35.0–47.0)
Hemoglobin: 7.9 g/dL — ABNORMAL LOW (ref 12.0–16.0)
MCH: 30.3 pg (ref 26.0–34.0)
MCHC: 33.9 g/dL (ref 32.0–36.0)
MCV: 89.4 fL (ref 80.0–100.0)
Platelets: 135 10*3/uL — ABNORMAL LOW (ref 150–440)
RBC: 2.61 MIL/uL — AB (ref 3.80–5.20)
RDW: 14.3 % (ref 11.5–14.5)
WBC: 14.9 10*3/uL — ABNORMAL HIGH (ref 3.6–11.0)

## 2018-05-03 LAB — BASIC METABOLIC PANEL
Anion gap: 19 — ABNORMAL HIGH (ref 5–15)
BUN: 94 mg/dL — AB (ref 6–20)
CO2: 21 mmol/L — ABNORMAL LOW (ref 22–32)
Calcium: 7.1 mg/dL — ABNORMAL LOW (ref 8.9–10.3)
Chloride: 89 mmol/L — ABNORMAL LOW (ref 101–111)
Creatinine, Ser: 5.39 mg/dL — ABNORMAL HIGH (ref 0.44–1.00)
GFR, EST AFRICAN AMERICAN: 10 mL/min — AB (ref >60)
GFR, EST NON AFRICAN AMERICAN: 9 mL/min — AB (ref >60)
Glucose, Bld: 116 mg/dL — ABNORMAL HIGH (ref 65–99)
POTASSIUM: 3.9 mmol/L (ref 3.5–5.1)
SODIUM: 129 mmol/L — AB (ref 135–145)

## 2018-05-03 LAB — GLUCOSE, CAPILLARY
GLUCOSE-CAPILLARY: 131 mg/dL — AB (ref 65–99)
Glucose-Capillary: 112 mg/dL — ABNORMAL HIGH (ref 65–99)

## 2018-05-03 LAB — PROCALCITONIN: PROCALCITONIN: 1.73 ng/mL

## 2018-05-03 MED ORDER — ALBUMIN HUMAN 25 % IV SOLN
25.0000 g | Freq: Four times a day (QID) | INTRAVENOUS | Status: AC
  Administered 2018-05-03 (×2): 25 g via INTRAVENOUS
  Filled 2018-05-03 (×2): qty 100

## 2018-05-03 MED ORDER — HYDROMORPHONE HCL 1 MG/ML IJ SOLN
0.5000 mg | INTRAMUSCULAR | Status: DC | PRN
  Administered 2018-05-03 – 2018-05-06 (×18): 0.5 mg via INTRAVENOUS
  Filled 2018-05-03 (×20): qty 1

## 2018-05-03 MED ORDER — FUROSEMIDE 10 MG/ML IJ SOLN
120.0000 mg | Freq: Once | INTRAVENOUS | Status: AC
  Administered 2018-05-03: 120 mg via INTRAVENOUS
  Filled 2018-05-03: qty 12

## 2018-05-03 MED ORDER — METOCLOPRAMIDE HCL 5 MG/ML IJ SOLN
5.0000 mg | Freq: Three times a day (TID) | INTRAMUSCULAR | Status: DC
  Administered 2018-05-03 – 2018-05-07 (×13): 5 mg via INTRAVENOUS
  Filled 2018-05-03 (×13): qty 2

## 2018-05-03 MED ORDER — PHENAZOPYRIDINE HCL 200 MG PO TABS
200.0000 mg | ORAL_TABLET | Freq: Three times a day (TID) | ORAL | Status: DC | PRN
  Administered 2018-05-04 – 2018-05-07 (×2): 200 mg via NASOGASTRIC
  Filled 2018-05-03 (×5): qty 1

## 2018-05-03 MED ORDER — BELLADONNA ALKALOIDS-OPIUM 16.2-60 MG RE SUPP
1.0000 | Freq: Three times a day (TID) | RECTAL | Status: DC | PRN
Start: 2018-05-03 — End: 2018-05-20
  Administered 2018-05-03 – 2018-05-15 (×8): 1 via RECTAL
  Filled 2018-05-03 (×10): qty 1

## 2018-05-03 NOTE — Progress Notes (Signed)
[facility] [place], [place] 05/03/18  Subjective:   Patient underwent abdominal hysterectomy, left salpingo-oophorectomy, laparoscopic lysis of adhesions and retroperitoneal dissection on 04/28/2018.  Followed by cystoscopy with retrograde pyelogram and left ureteral stent placement on 04/29/2018 Baseline creatinine is 0.79 from 04/19/2018 Patient has developed postoperative acute renal failure, left hydronephrosis for which left ureteral stent was placed on June 9; Rt hydronephrosis noted on renal U/S; Rt nephrostomy tube placed 9/10  Taking sips of clears. Still has large out put from NG tube UOP slightly better BUN/Cr are worse but rate of increase is slower   Objective:  Vital signs in last 24 hours:  Temp:  [97.5 F (36.4 C)-98.2 F (36.8 C)] 97.8 F (36.6 C) (06/13 0800) Pulse Rate:  [104-116] 107 (06/13 0800) Resp:  [15-26] 16 (06/13 0800) BP: (101-142)/(69-97) 119/73 (06/13 0800) SpO2:  [91 %-100 %] 92 % (06/13 0800)  Weight change:  There were no vitals filed for this visit.  Intake/Output:    Intake/Output Summary (Last 24 hours) at 05/03/2018 0950 Last data filed at 05/03/2018 0810 Gross per 24 hour  Intake 3302.33 ml  Output 7595 ml  Net -4292.67 ml     Physical Exam: General: Ill appearing, laying in bed  HEENT Dry oral mucus membranes, NG tube  Neck supple  Pulm/lungs Decreased breath sounds at bases  CVS/Heart Tachycardic, regular  Abdomen:  Distended, sluggish bowel sounds  Extremities: +++ tight edema  Neurologic: Alert, oriented  Skin: No acute rashes   Foley in place, rt nephrostomy       Basic Metabolic Panel:  Recent Labs  Lab 04/29/18 2226 04/30/18 0628 04/30/18 1329 05/01/18 0533 05/02/18 0402 05/03/18 0158  NA 133* 132* 130* 131* 133* 130*  K 4.2 4.2 4.4 4.4 4.7 4.2  CL 96* 96* 94* 94* 95* 91*  CO2 24 23 23 22  21* 20*  GLUCOSE 165* 167* 164* 151* 141* 120*  BUN 36* 44* 46* 59* 81* 87*  CREATININE 2.90* 3.36*  3.71* 4.15* 4.99* 5.30*  CALCIUM 6.5* 6.5* 6.4* 6.4* 6.5* 6.7*  MG 1.4* 2.7*  --   --   --   --   PHOS 4.2  --   --   --   --   --      CBC: Recent Labs  Lab 04/29/18 0503 04/29/18 2226 04/30/18 0628 05/01/18 0533 05/02/18 0402 05/03/18 0158  WBC 16.1* 18.3* 19.7* 16.1* 18.6* 14.9*  NEUTROABS 14.5*  --   --   --   --   --   HGB 10.6* 10.5* 10.3* 9.7* 9.2* 7.9*  HCT 31.1* 30.4* 30.6* 28.5* 26.9* 23.4*  MCV 90.2 89.0 89.8 90.3 90.1 89.4  PLT 105* 87* 99* 97* 132* 135*     No results found for: HEPBSAG, HEPBSAB, HEPBIGM    Microbiology:  Recent Results (from the past 240 hour(s))  MRSA PCR Screening     Status: None   Collection Time: 04/27/18 10:27 PM  Result Value Ref Range Status   MRSA by PCR NEGATIVE NEGATIVE Final    Comment:        The GeneXpert MRSA Assay (FDA approved for NASAL specimens only), is one component of a comprehensive MRSA colonization surveillance program. It is not intended to diagnose MRSA infection nor to guide or monitor treatment for MRSA infections. Performed at [facility], [address] [place], [place] [address]   CULTURE, BLOOD (ROUTINE X 2) w Reflex to ID Panel     Status: None (Preliminary  result)   Collection Time: 04/28/18  9:24 PM  Result Value Ref Range Status   Specimen Description BLOOD RIGHT HAND  Final   Special Requests   Final    BOTTLES DRAWN AEROBIC AND ANAEROBIC Blood Culture adequate volume   Culture   Final    NO GROWTH 4 DAYS Performed at [facility], [address], [place], [place] [address]    Report Status PENDING  Incomplete  CULTURE, BLOOD (ROUTINE X 2) w Reflex to ID Panel     Status: None (Preliminary result)   Collection Time: 04/28/18  9:44 PM  Result Value Ref Range Status   Specimen Description BLOOD RIGHT ANTECUBITAL  Final   Special Requests   Final    BOTTLES DRAWN AEROBIC AND ANAEROBIC Blood Culture adequate volume   Culture   Final    NO GROWTH 4 DAYS Performed at  [facility], [address], [place], [place] [address]    Report Status PENDING  Incomplete    Coagulation Studies: No results for input(s): LABPROT, INR in the last 72 hours.  Urinalysis: No results for input(s): COLORURINE, LABSPEC, PHURINE, GLUCOSEU, HGBUR, BILIRUBINUR, KETONESUR, PROTEINUR, UROBILINOGEN, NITRITE, LEUKOCYTESUR in the last 72 hours.  Invalid input(s): APPERANCEUR    Imaging: Ct Abdomen Pelvis Wo Contrast  Result Date: 05/02/2018 CLINICAL DATA:  Bowel obstruction. Recent total hysterectomy complicated by hemorrhagic shock. EXAM: CT ABDOMEN AND PELVIS WITHOUT CONTRAST TECHNIQUE: Multidetector CT imaging of the abdomen and pelvis was performed following the standard protocol without IV contrast. COMPARISON:  CT abdomen pelvis dated April 28, 2018. FINDINGS: Lower chest: Right greater than left basilar atelectasis. Hepatobiliary: No focal liver abnormality is seen. No gallstones, gallbladder wall thickening, or biliary dilatation. Pancreas: Unremarkable. No pancreatic ductal dilatation or surrounding inflammatory changes. Spleen: Normal in size without focal abnormality. Adrenals/Urinary Tract: The adrenal glands are unremarkable. Interval placement of a right percutaneous nephrostomy tube with the tip in the proximal ureter. No right hydronephrosis. Interval placement of a left double-J ureteral stent, with unchanged mild left hydronephrosis. The bladder is decompressed by Foley catheter. Stomach/Bowel: Distended, contrast filled distal esophagus and stomach. Enteric tube within the gastric body. Moderately dilated loops of proximal small bowel, with gradual transition to nondilated small bowel in the left abdomen. Distal small bowel loops are decompressed. The colon is unremarkable. Vascular/Lymphatic: No enlarged abdominal or pelvic lymph nodes. Prominent bilateral pelvic varices again noted. Reproductive: Status post hysterectomy. Relatively unchanged 5.0 x 4.8 cm  hematoma within the hysterectomy bed. Other: Mild presacral inflammatory changes, similar to prior study. Postsurgical changes and resolving subcutaneous emphysema in the lower abdominal wall. New moderate anasarca. Small amount of free fluid in the left paracolic gutter and pelvis. No pneumoperitoneum. Musculoskeletal: No acute or significant osseous findings. IMPRESSION: 1. Moderately dilated loops of proximal small bowel with gradual transition to nondilated small bowel in the left abdomen. Findings are favored to reflect ileus versus partial small bowel obstruction. 2. Persistent mild left hydronephrosis status post left double-J ureteral stent placement. 3. Resolved right hydronephrosis status post right percutaneous nephrostomy tube placement. 4. Unchanged 5.0 cm hematoma within the hysterectomy bed. 5. New moderate anasarca. Electronically Signed   By: Obie Dredge M.D.   On: 05/02/2018 12:18   Dg Chest Port 1 View  Result Date: 05/03/2018 CLINICAL DATA:  Respiratory failure EXAM: PORTABLE CHEST 1 VIEW COMPARISON:  04/29/2018 FINDINGS: Mild left lower lobe atelectasis unchanged. Progression of right lower lobe atelectasis. Negative for edema or effusion. Central venous catheter  tip in the SVC. NG tube in the stomach. IMPRESSION: Bibasilar atelectasis, with progression of right lower lobe atelectasis. Electronically Signed   By: Marlan Palau M.D.   On: 05/03/2018 07:03     Medications:   . albumin human 25 g (05/03/18 0917)  . furosemide 120 mg (05/03/18 0925)  . meropenem (MERREM) IV Stopped (05/02/18 2151)   . docusate sodium  100 mg Oral BID  . heparin injection (subcutaneous)  5,000 Units Subcutaneous Q8H  . mouth rinse  15 mL Mouth Rinse BID  . metoCLOPramide (REGLAN) injection  5 mg Intravenous Q8H  . pantoprazole (PROTONIX) IV  40 mg Intravenous Q24H  . polyethylene glycol  17 g Oral Daily  . sodium chloride flush  10-40 mL Intracatheter Q12H   HYDROmorphone (DILAUDID)  injection, lidocaine, menthol-cetylpyridinium, metoprolol tartrate, [DISCONTINUED] ondansetron **OR** ondansetron (ZOFRAN) IV, phenol, promethazine, sodium chloride flush  Assessment/ Plan:  42 y.o. caucasian female is post op abdominal hysterectomy, left salpingo-oophorectomy, laparoscopic lysis of adhesions and retroperitoneal dissection on 04/28/2018.  Followed by cystoscopy with retrograde pyelogram and left ureteral stent placement on 04/29/2018 Baseline creatinine is 0.79 from 04/19/2018 Post op case complicated by ARF, b/l  hydronephrosis  1. ARF, likely multifactorial from severe ATN and b/l hydronephrosis. - Left ureteral stent in place - rt nephrostomy placed 6/10 - some pink tinge in the urine noted.     Continue to monitor UOP ~1300 cc last 24 hrs - S Creatinine is worse again today despite above interventions. Patient also continues to have massive generalized edema.  She is currently on room air. Electrolytes are acceptable. No acute indication to start CRRT/IHD but will have a low threshold of starting HD . Trial of lasix/Albumin again today  2. Generalized edema - quite severe - does not appear to affecting respiratory status - will continue to monitor closely      LOS: 6 Damione Robideau Thedore Mins 6/13/20199:50 AM  [facility] [place], [place] [phone]  Note: This note was prepared with Dragon dictation. Any transcription errors are unintentional

## 2018-05-03 NOTE — Evaluation (Signed)
Occupational Therapy Evaluation Patient Details Name: [name] MRN: [id number] DOB: 24-May-1976 Today's Date: 05/03/2018    History of Present Illness Pt is a 42 y/o F s/p laparascopic hysterectomy, Bil aslpingectomy 6/7.  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  Developed hematuria and L hydronephrosis 06/09 requiring cystoscopy and left ureteral stent placement. Renal US 6/10 revealed right hydronephrosis. S/P R percutaneous nephrostomy tube placement 6/10.  NG tube placed 6/10.  Pt with AKI and ileus.  Pt's PMH includes DVT.    Clinical Impression   Pt seen for OT evaluation this date. Prior to hospital admission, pt was independent, active, no falls, and working full time as a Scientist, physiological at a Nurse, learning disability.  Pt lives with her spouse and 3 adult sons (45, 34, and 78yo). Pt endorses 24/7 assist as needed once she is able to return home. Currently pt demonstrates impairments in strength generally BUE/BLE, activity tolerance, and pain/discomfort following surgery and catheter, resulting in the need for mod-max  assist for LB ADL and mod assist +2 for bed mobility (+1 for sup>sit EOB, +2 lines/leads t/o and for sit>sup). Pt tolerated sitting EOB for approx 2 minutes with varying assist secondary to fatigue (this is the pt's first time sitting up since initial surgery on 04/27/18. Pt would benefit from skilled OT to address noted impairments and functional limitations (see below for any additional details) in order to maximize safety and independence while minimizing falls risk and caregiver burden. Recommend acute in-patient rehab following hospitalization. Will continue to progress.     Follow Up Recommendations  CIR    Equipment Recommendations  Other (comment)(TBD)    Recommendations for Other Services       Precautions / Restrictions Precautions Precautions: Fall;Other  (comment) Precaution Comments: ureteral drain, nephrostomy, NG tube, L IJ line Restrictions Weight Bearing Restrictions: No      Mobility Bed Mobility Overal bed mobility: Needs Assistance Bed Mobility: Supine to Sit;Sit to Supine     Supine to sit: HOB elevated;+2 for safety/equipment;Max assist;Mod assist Sit to supine: Mod assist;+2 for physical assistance;+2 for safety/equipment   General bed mobility comments: Sup>sit EOB performed with +1 assist from PTA (OT managing extensive lines/leads), mod +2 for sit>sup   Transfers                 General transfer comment: unsafe to attempt this date    Balance Overall balance assessment: Needs assistance Sitting-balance support: Bilateral upper extremity supported;Feet supported Sitting balance-Leahy Scale: Poor Sitting balance - Comments: requires varying assist from therapist to maintain upright 2:2 fatigue                                   ADL either performed or assessed with clinical judgement   ADL Overall ADL's : Needs assistance/impaired Eating/Feeding: Independent;Bed level Eating/Feeding Details (indicate cue type and reason): clear liquid diet at this time, but physically able to self feed Grooming: Bed level;Set up   Upper Body Bathing: Bed level;Minimal assistance   Lower Body Bathing: Bed level;Moderate assistance;Maximal assistance   Upper Body Dressing : Bed level;Minimal assistance   Lower Body Dressing: Bed level;Moderate assistance;Maximal assistance     Toilet Transfer Details (indicate cue type and reason): unable to attempt this session, will continue to assess when appropriate  Vision Patient Visual Report: No change from baseline       Perception     Praxis      Pertinent Vitals/Pain Pain Assessment: (pt endorses "discomfort" near catheter area) Pain Descriptors / Indicators: Discomfort Pain Intervention(s): Limited activity within patient's  tolerance;Monitored during session;Repositioned     Hand Dominance Right   Extremity/Trunk Assessment Upper Extremity Assessment Upper Extremity Assessment: Generalized weakness(grossly 4-/5 bilaterally)   Lower Extremity Assessment Lower Extremity Assessment: Defer to PT evaluation;Generalized weakness   Cervical / Trunk Assessment Cervical / Trunk Assessment: Normal   Communication Communication Communication: No difficulties   Cognition Arousal/Alertness: Awake/alert Behavior During Therapy: WFL for tasks assessed/performed Overall Cognitive Status: Within Functional Limits for tasks assessed                                     General Comments  HR up to 117 with bed mobility, otherwise, VSS. All lines/leads in place at start and at end of session    Exercises     Shoulder Instructions      Home Living Family/patient expects to be discharged to:: Private residence Living Arrangements: Spouse/significant other;Children(3 sons - 63, 42, and 57) Available Help at Discharge: Family;Available 24 hours/day;Friend(s)   Home Access: Stairs to enter Entrance Stairs-Number of Steps: 4 Entrance Stairs-Rails: Right;Left;Can reach both Home Layout: One level     Bathroom Shower/Tub: IT trainer: Standard     Home Equipment: None          Prior Functioning/Environment Level of Independence: Independent        Comments: Pt was independent with all aspects of mobility and all ADLs, IADLs.  Does not use AD to ambulate. Works full time as a Scientist, physiological at a [employer] Problem List: Decreased strength;Decreased activity tolerance;Decreased knowledge of use of DME or AE;Impaired balance (sitting and/or standing)      OT Treatment/Interventions: Self-care/ADL training;Therapeutic exercise;Balance training;Therapeutic activities;DME and/or AE instruction;Patient/family education;Energy conservation    OT  Goals(Current goals can be found in the care plan section) Acute Rehab OT Goals Patient Stated Goal: to return to PLOF as quickly as possible OT Goal Formulation: With patient Time For Goal Achievement: 05/17/18 Potential to Achieve Goals: Good ADL Goals Pt Will Perform Grooming: sitting;with min guard assist(>5 minutes seated EOB to perform, CGA for sit bal) Pt Will Perform Lower Body Dressing: with mod assist;with adaptive equipment;sit to/from stand Pt Will Transfer to Toilet: with min assist;bedside commode;ambulating(LRAD for amb)  OT Frequency: Min 3X/week   Barriers to D/C:            Co-evaluation              AM-PAC PT "6 Clicks" Daily Activity     Outcome Measure Help from another person eating meals?: None Help from another person taking care of personal grooming?: None Help from another person toileting, which includes using toliet, bedpan, or urinal?: A Lot Help from another person bathing (including washing, rinsing, drying)?: A Lot Help from another person to put on and taking off regular upper body clothing?: A Little Help from another person to put on and taking off regular lower body clothing?: A Lot 6 Click Score: 17   End of Session    Activity Tolerance: Patient tolerated treatment well Patient left: in bed;with call bell/phone within reach;with bed alarm set;Other (comment)(PTA in room  for further treatment)  OT Visit Diagnosis: Other abnormalities of gait and mobility (R26.89);Muscle weakness (generalized) (M62.81)                Time: [id number] OT Time Calculation (min): 21 min Charges:  OT General Charges $OT Visit: 1 Visit OT Evaluation $OT Eval Moderate Complexity: 1 Mod  Richrd PrimeJamie Stiller, MPH, MS, OTR/L ascom [phone] 05/03/18, 11:31 AM

## 2018-05-03 NOTE — Progress Notes (Signed)
Patient ID: Jonelle Sidle, female   DOB: 06-08-1976, 42 y.o.   MRN: [id number] 5 Days Post-Op  Procedure(s):  1. 04/28/18: ATTEMPTED LAPAROSCOPIC HYSTERECTOMY CONVERTED TO SUPRACERVICAL ABDOMINAL HYSTERECTOMY (N/A) SALPINGO OOPHORECTOMY (Left) CYSTOSCOPY (N/A) UNILATERAL SALPINGECTOMY (Right) LAPAROSCOPIC LYSIS OF ADHESIONS, RETROPERITONEAL DISSECTION  2. 04/29/18: CYSTOSCOPY WITH RETROGRADE PYELOGRAM/Left URETERAL STENT PLACEMENT, ureteroscopy (Bilateral)    Subjective: Pt still in ICU, s/p NG tube placed since 6/10 with decreased abdominal distension, improved and talking. Family at bedside, good support.  Hospital course:    6/14- On broad spectrum, lasix and albumin today, with improved urine output over the day. Creatinine still rising.    - Nausea improved with NG tube placement and now resolved.    - Foley causing most pain, 0.5mg  dilaudid PRN    - Working with physical therapy    - s/p SCDs, now heparin and TED hose  6/13 - CT scan , no blood loss in pelvis, unchanged 5cm hematoma, no mention of infection. - continuing tachycardic in sinus rhythm, now in 120s. 98-100% O2 sats on room air.   - Troponin bumped 0.29 04/30/18, now 0.15 05/01/18, no longer checking  - on 5000U of heparin q8 hrs for DVT ppx.  She was started on Zosyn for clinical dx of sepsis with persistent tachycardia, elevated lactic acid 2.5 (4.2) and elevated WBC to 16.1 (down from 17.3 just postop) , 3.375g q8hrs.  WBC 18.3 6/10 WBC 16.1 6/11 Her WBC bump today prompted to broaden the spectrum, on meropenum.   H/H remains stable. Platelets stable, up to >100 (97) (87)      - s/p OR with Dr. Alvester Morin on 04/29/18 from urology for cystoscopy and left pyelogram and left ureteral stent placement for investigation of bladder and ureters after continued low urine output and bloody urine with pelvic pain. Previously, pt did have CT scan without contrast because of acute renal insufficiency to evaluate bladder, and no  bladder injury noted. 5cm hematoma in pelvis noted on CT scan, which I would expect given the surgery. She has a left A-line in place.    Objective: Vital signs in last 24 hours: Temp:  [97.5 F (36.4 C)-97.8 F (36.6 C)] 97.6 F (36.4 C) (06/13 1200) Pulse Rate:  [104-116] 108 (06/13 1000) Resp:  [15-25] 16 (06/13 1000) BP: (101-142)/(69-97) 130/79 (06/13 1000) SpO2:  [91 %-100 %] 97 % (06/13 1000) Weight:  [93 kg (205 lb)] 93 kg (205 lb) (06/13 0800)  Intake/Output  Intake/Output Summary (Last 24 hours) at 05/03/2018 1719 Last data filed at 05/03/2018 1715 Gross per 24 hour  Intake 2730 ml  Output 5780 ml  Net -3050 ml    Physical Exam:  General: Oriented and responsive, appears less ill. CV: Regular rhythm, no murmurs, increased rate Lungs: Decreased breath sounds in bilateral bases, L>R GI: Tense, moderately-distended. Ecchymosis over mons deepening, and lap incisions clean and dry. Minimal bowel sounds.   Incisions: Clean and dry, honeycomb dressing in place. Urine: Pink, Foley in place Extremities: Nontender, no erythema, significant edema in all extremities less tense than yesterday, with a bola on left lateral arm at site of prior IV, covered with a dressing. Fingers no longer edematous.  Lab Results: Recent Labs    05/01/18 0533 05/02/18 0402 05/03/18 0158  HGB 9.7* 9.2* 7.9*  HCT 28.5* 26.9* 23.4*  WBC 16.1* 18.6* 14.9*  PLT 97* 132* 135*                 Results for orders placed  or performed during the hospital encounter of 04/27/18 (from the past 24 hour(s))  CBC     Status: Abnormal   Collection Time: 05/03/18  1:58 AM  Result Value Ref Range   WBC 14.9 (H) 3.6 - 11.0 K/uL   RBC 2.61 (L) 3.80 - 5.20 MIL/uL   Hemoglobin 7.9 (L) 12.0 - 16.0 g/dL   HCT 16.123.4 (L) 09.635.0 - 04.547.0 %   MCV 89.4 80.0 - 100.0 fL   MCH 30.3 26.0 - 34.0 pg   MCHC 33.9 32.0 - 36.0 g/dL   RDW 40.914.3 81.111.5 - 91.414.5 %   Platelets 135 (L) 150 - 440 K/uL  Comprehensive metabolic panel      Status: Abnormal   Collection Time: 05/03/18  1:58 AM  Result Value Ref Range   Sodium 130 (L) 135 - 145 mmol/L   Potassium 4.2 3.5 - 5.1 mmol/L   Chloride 91 (L) 101 - 111 mmol/L   CO2 20 (L) 22 - 32 mmol/L   Glucose, Bld 120 (H) 65 - 99 mg/dL   BUN 87 (H) 6 - 20 mg/dL   Creatinine, Ser 7.825.30 (H) 0.44 - 1.00 mg/dL   Calcium 6.7 (L) 8.9 - 10.3 mg/dL   Total Protein 7.0 6.5 - 8.1 g/dL   Albumin 4.0 3.5 - 5.0 g/dL   AST 36 15 - 41 U/L   ALT 10 (L) 14 - 54 U/L   Alkaline Phosphatase 96 38 - 126 U/L   Total Bilirubin 1.1 0.3 - 1.2 mg/dL   GFR calc non Af Amer 9 (L) >60 mL/min   GFR calc Af Amer 11 (L) >60 mL/min   Anion gap 19 (H) 5 - 15  Procalcitonin     Status: None   Collection Time: 05/03/18  1:58 AM  Result Value Ref Range   Procalcitonin 1.73 ng/mL  Glucose, capillary     Status: Abnormal   Collection Time: 05/03/18 11:37 AM  Result Value Ref Range   Glucose-Capillary 131 (H) 65 - 99 mg/dL  Basic metabolic panel     Status: Abnormal   Collection Time: 05/03/18  4:11 PM  Result Value Ref Range   Sodium 129 (L) 135 - 145 mmol/L   Potassium 3.9 3.5 - 5.1 mmol/L   Chloride 89 (L) 101 - 111 mmol/L   CO2 21 (L) 22 - 32 mmol/L   Glucose, Bld 116 (H) 65 - 99 mg/dL   BUN 94 (H) 6 - 20 mg/dL   Creatinine, Ser 9.565.39 (H) 0.44 - 1.00 mg/dL   Calcium 7.1 (L) 8.9 - 10.3 mg/dL   GFR calc non Af Amer 9 (L) >60 mL/min   GFR calc Af Amer 10 (L) >60 mL/min   Anion gap 19 (H) 5 - 15    Assessment/Plan: 5 Days Post-Op        Procedure(s): 1. 04/28/18: ATTEMPTED LAPAROSCOPIC HYSTERECTOMY CONVERTED TO SUPRACERVICAL ABDOMINAL HYSTERECTOMY (N/A) SALPINGO OOPHORECTOMY (Left) CYSTOSCOPY (N/A) UNILATERAL SALPINGECTOMY (Right) LAPAROSCOPIC LYSIS OF ADHESIONS, RETROPERITONEAL DISSECTION   2. 04/29/18: CYSTOSCOPY WITH RETROGRADE PYELOGRAM/Left URETERAL STENT PLACEMENT, ureteroscopy (Bilateral)   Ileus: abdominal distension, sips and chips, now with NG tube, awaiting resolution  Acute renal  insufficiency - managed by nephrology, lasix x1 today, s/p perc neph yesterday on right and has left stent in place. Foley with clear pink urine, much less bloody today. No longer with CBI. Minimal urine output, 25ml overnight, retry lasix today  Hyperkalemia - resolved Leukocytosis - expected. S/p zosyn. Her procalcitonin raised slightly and WBC rise  slightly, now on broader spectrum. No fever  Thrombocytopenia - stable  Hematuria - foley in place. Appreciate urology and nephrology recs.  Pain: s/p PCA. Will restart if needed, pain meds for ambulation with PT when able  Tachycardia - persistent, decreased. O2 sats 98-100 on room air.  DVT ppx: Heparin. TED recommended Acute blood loss anemia, s/p massive transfusion protocol - stable Third-spacing fluid in extremities - improved clinically, improving UOP.   When tolerated recommend out of bed to chair and Ambulate. Encourage Incentive spirometry.  -Working with PT Pain meds as needed to encourage out of bed.  Appreciate urology and nephrology assistance.  Appreciate intensivist involvement for monitoring and treatment.   Christeen Douglas, MD   LOS: 6 days   Christeen Douglas 05/03/2018, 5:19 PMPatient ID: Jonelle Sidle, female   DOB: 1976-02-11, 42 y.o.   MRN: [id number] Patient ID: JAHARI WIGINTON, female   DOB: Dec 21, 1975, 42 y.o.   MRN: [id number]

## 2018-05-03 NOTE — Progress Notes (Addendum)
Physical Therapy Treatment Patient Details Name: [name] MRN: [id number] DOB: 21-Jan-1976 Today's Date: 05/03/2018    History of Present Illness Pt is a 42 y/o F s/p laparascopic hysterectomy, Bil aslpingectomy 6/7.  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  Developed hematuria and L hydronephrosis 06/09 requiring cystoscopy and left ureteral stent placement. Renal US 6/10 revealed right hydronephrosis. S/P R percutaneous nephrostomy tube placement 6/10.  NG tube placed 6/10.  Pt with AKI and ileus.  Pt's PMH includes DVT.     PT Comments    Chart reviewed.  Checked with K. Manson Passey PT and primary nurse prior to session regarding IJ line.  Line is standard line and pt ok for mobility trials.  Pt awake and ready for session.  Participated in exercises as described below.  Pt seen with assist of OT for mobility.  Pt with multiple lines and drains requiring +2 for management, and physical assist.  Mod/max a x 2 to transition to edge of bed where she maintained sitting with mod assist for about 4 minutes before needing to return to supine due to fatigue.  Pt overall tolerated mobility well but is not ready to attempt standing or transfer to chair at this time.  Reviewed HEP and pt voiced understanding.  Discussed new bed available that would allow transition to standing with pt and nurse.  Pt agreed and seemed excited to try bed when available and staff training is complete.   She remains appropriate for CIR and highly motivated to return home to her family and work.  Catheter discomfort and general fatigue were limiting factors this session.  Primary nurse aware and addressing catheter.     Follow Up Recommendations  CIR     Equipment Recommendations  Other (comment)    Recommendations for Other Services Rehab consult     Precautions / Restrictions Precautions Precautions: Fall;Other  (comment) Precaution Comments: ureteral drain, nephrostomy, NG tube, L IJ line Restrictions Weight Bearing Restrictions: No    Mobility  Bed Mobility Overal bed mobility: Needs Assistance Bed Mobility: Supine to Sit;Sit to Supine     Supine to sit: HOB elevated;+2 for safety/equipment;Max assist;Mod assist Sit to supine: Mod assist;+2 for physical assistance;+2 for safety/equipment   General bed mobility comments: Sup>sit EOB performed with +1 assist from PTA (OT managing extensive lines/leads), mod +2 for sit>sup   Transfers                 General transfer comment: unsafe to attempt this date  Ambulation/Gait                 Stairs             Wheelchair Mobility    Modified Rankin (Stroke Patients Only)       Balance Overall balance assessment: Needs assistance Sitting-balance support: Bilateral upper extremity supported;Feet supported Sitting balance-Leahy Scale: Poor Sitting balance - Comments: requires varying assist from therapist to maintain upright 2:2 fatigue                                    Cognition Arousal/Alertness: Awake/alert Behavior During Therapy: WFL for tasks assessed/performed Overall Cognitive Status: Within Functional Limits for tasks assessed  Exercises General Exercises - Lower Extremity Ankle Circles/Pumps: AROM;Both;10 reps;Supine Quad Sets: Strengthening;Both;10 reps;Supine Heel Slides: AAROM;Both;10 reps;Supine Hip ABduction/ADduction: AAROM;Both;10 reps;Supine Straight Leg Raises: AAROM;Both;10 reps;Supine    General Comments General comments (skin integrity, edema, etc.): HR up to 117 with bed mobility, otherwise, VSS. All lines/leads in place at start and at end of session      Pertinent Vitals/Pain Pain Assessment: Faces Pain Score: 4  Pain Location: mostly pain from catheter - nurse notified. Pain Descriptors / Indicators:  Discomfort Pain Intervention(s): Limited activity within patient's tolerance;Monitored during session    Home Living Family/patient expects to be discharged to:: Private residence Living Arrangements: Spouse/significant other;Children(3 sons - 2526, 6121, and 2918) Available Help at Discharge: Family;Available 24 hours/day;Friend(s)   Home Access: Stairs to enter Entrance Stairs-Rails: Right;Left;Can reach both Home Layout: One level Home Equipment: None      Prior Function Level of Independence: Independent      Comments: Pt was independent with all aspects of mobility and all ADLs, IADLs.  Does not use AD to ambulate. Works full time as a Scientist, physiologicalreceptionist at a Nurse, learning disabilityveterinary office   PT Goals (current goals can now be found in the care plan section) Acute Rehab PT Goals Patient Stated Goal: to return to PLOF as quickly as possible Progress towards PT goals: Progressing toward goals    Frequency    7X/week      PT Plan Current plan remains appropriate    Co-evaluation              AM-PAC PT "6 Clicks" Daily Activity  Outcome Measure  Difficulty turning over in bed (including adjusting bedclothes, sheets and blankets)?: Unable Difficulty moving from lying on back to sitting on the side of the bed? : Unable Difficulty sitting down on and standing up from a chair with arms (e.g., wheelchair, bedside commode, etc,.)?: Unable Help needed moving to and from a bed to chair (including a wheelchair)?: Total Help needed walking in hospital room?: Total Help needed climbing 3-5 steps with a railing? : Total 6 Click Score: 6    End of Session   Activity Tolerance: Patient limited by fatigue;Patient limited by lethargy Patient left: in bed;with call bell/phone within reach;Other (comment) Nurse Communication: Mobility status;Other (comment)       Time: 1000-1028 PT Time Calculation (min) (ACUTE ONLY): 28 min  Charges:  $Therapeutic Exercise: 8-22 mins $Therapeutic Activity:  8-22 mins                    G Codes:       Danielle DessSarah Toryn Dewalt, PTA 05/03/18, 11:51 AM

## 2018-05-03 NOTE — Progress Notes (Signed)
PT PROFILE: 89 F with PMH of GERD, DVT, and DUB, underwent total hysterectomy and bilateral laparoscopic salpingectomy 06/07. 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 Developed hematuria and L hydronephrosis 06/09 requiring cystoscopy and left ureteral stent placement. Renal US 6/10 revealed right hydronephrosis. S/P R percutaneous nephrostomy tube placement 6/10  CTAP 6/12: Ileus pattern, mild left hydronephrosis persists, right hydronephrosis resolved, 5 cm hematoma within the hysterectomy bed (unchanged), moderate anasarca  SUBJ: Looks about the same today.  Still with significant nausea.  NGT was not removed yesterday due to persistent nausea.  She is taking clear liquids by mouth but most of these are being suctioned back out by NG tube..  Denies dyspnea.  Abdominal pain improved.  Complains of pain associated with Foley catheter.  Urine output improving  Vitals:   05/03/18 0700 05/03/18 0800 05/03/18 0900 05/03/18 1000  BP: 129/69 119/73 132/79 130/79  Pulse: (!) 108 (!) 107 (!) 107 (!) 108  Resp: 18 16 20 16   Temp:  97.8 F (36.6 C)    TempSrc:  Axillary    SpO2: 94% 92% 97% 97%  2 LPM [place]  Gen: No overt distress HEENT: NGT in place, NCAT, sclerae white Neck: JVP not visualized Lungs: Clear anteriorly without wheezes or adventitious sounds Cardiovascular: Mild tachycardia, regular, no murmur Abdomen: distended, firm, BS markedly diminished to absent, no rebound tenderness Ext: Edema in all extremities, NSC Neuro: No focal neurologic deficits   BMP Latest Ref Rng & Units 05/03/2018 05/02/2018 05/01/2018  Glucose 65 - 99 mg/dL 161(W120(H) 960(A141(H) 540(J151(H)  BUN 6 - 20 mg/dL 81(X87(H) 91(Y81(H) 78(G59(H)  Creatinine 0.44 - 1.00 mg/dL 9.56(O5.30(H) 1.30(Q4.99(H) 6.57(Q4.15(H)  Sodium 135 - 145 mmol/L  130(L) 133(L) 131(L)  Potassium 3.5 - 5.1 mmol/L 4.2 4.7 4.4  Chloride 101 - 111 mmol/L 91(L) 95(L) 94(L)  CO2 22 - 32 mmol/L 20(L) 21(L) 22  Calcium 8.9 - 10.3 mg/dL 6.7(L) 6.5(L) 6.4(LL)    CBC Latest Ref Rng & Units 05/03/2018 05/02/2018 05/01/2018  WBC 3.6 - 11.0 K/uL 14.9(H) 18.6(H) 16.1(H)  Hemoglobin 12.0 - 16.0 g/dL 7.9(L) 9.2(L) 9.7(L)  Hematocrit 35.0 - 47.0 % 23.4(L) 26.9(L) 28.5(L)  Platelets 150 - 440 K/uL 135(L) 132(L) 97(L)     Results for orders placed or performed during the hospital encounter of 04/27/18  MRSA PCR Screening     Status: None   Collection Time: 04/27/18 10:27 PM  Result Value Ref Range Status   MRSA by PCR NEGATIVE NEGATIVE Final    Comment:        The GeneXpert MRSA Assay (FDA approved for NASAL specimens only), is one component of a comprehensive MRSA colonization surveillance program. It is not intended to diagnose MRSA infection nor to guide or monitor treatment for MRSA infections. Performed at [facility], [address] [place], [place] [id number]   CULTURE, BLOOD (ROUTINE X 2) w Reflex to ID Panel     Status: None   Collection Time: 04/28/18  9:24 PM  Result Value Ref Range Status   Specimen Description BLOOD RIGHT HAND  Final   Special Requests   Final    BOTTLES DRAWN AEROBIC AND ANAEROBIC Blood Culture adequate volume   Culture   Final    NO GROWTH 5 DAYS Performed at [facility],  [address] [place], [place] [address]    Report Status 05/03/2018 FINAL  Final  CULTURE, BLOOD (ROUTINE X 2) w Reflex to ID Panel     Status: None   Collection Time: 04/28/18  9:44 PM  Result Value Ref Range Status   Specimen Description BLOOD RIGHT ANTECUBITAL  Final   Special Requests   Final    BOTTLES DRAWN AEROBIC AND ANAEROBIC Blood Culture adequate volume   Culture   Final    NO GROWTH 5 DAYS Performed at [facility], [address] [place], [place] [address]    Report Status 05/03/2018 FINAL  Final     Anti-infectives (From admission, onward)   Start     Dose/Rate Route Frequency Ordered Stop   05/02/18 1000  meropenem (MERREM) 500 mg in sodium chloride 0.9 % 100 mL IVPB     500 mg 200 mL/hr over 30 Minutes Intravenous Every 12 hours 05/02/18 0911     05/01/18 2200  piperacillin-tazobactam (ZOSYN) IVPB 3.375 g  Status:  Discontinued     3.375 g 12.5 mL/hr over 240 Minutes Intravenous Every 12 hours 05/01/18 1952 05/02/18 0838   04/30/18 2200  piperacillin-tazobactam (ZOSYN) IVPB 3.375 g  Status:  Discontinued     3.375 g 12.5 mL/hr over 240 Minutes Intravenous Every 12 hours 04/30/18 0833 05/01/18 1509   04/29/18 0330  piperacillin-tazobactam (ZOSYN) IVPB 3.375 g  Status:  Discontinued     3.375 g 12.5 mL/hr over 240 Minutes Intravenous Every 8 hours 04/29/18 0317 04/30/18 0833   04/27/18 0615  ceFAZolin (ANCEF) 2-4 GM/100ML-% IVPB    Note to Pharmacy:  Lorrene Reid   : cabinet override      04/27/18 0615 04/27/18 0746   04/27/18 0600  ceFAZolin (ANCEF) IVPB 2g/100 mL premix     2 g 200 mL/hr over 30 Minutes Intravenous On call to O.R. 04/26/18 2240 04/27/18 1142     CTAP 6/12: Ileus pattern, mild left hydronephrosis persists, right hydronephrosis resolved, 5 cm hematoma within the hysterectomy bed (unchanged), moderate anasarca  CXR: Minimal bibasilar atelectasis, distended bowels and upper abdomen  IMP: S/P hysterectomy 06/07 with intra-abdominal bleeding complications  Status post massive transfusion protocol Severe hematuria - resolved Left hydronephrosis - Status post left ureteral stent Right hydronephrosis - Status post percutaneous nephrostomy tube AKI, oligo-anuric.  Urine output improving some 6/13  Repeat trial of furosemide today with albumin  Still might require HD Hypervolemia/anasarca Acute blood loss anemia, not actively bleeding presently Ileus with nausea Mildly elevated PCT  PLAN/REC: Repeat furosemide today with albumin Repeat BMET later  today Continue NGT to LIWS Low-dose metoclopramide initiated 6/13 Reduced dose of opiates as able Continue empiric meropenem Discontinue NGT Continue clear liquids as tolerated Continue antiemetics Discussed with nephrology   Billy Fischer, MD PCCM service Mobile [phone] Pager [phone] 05/03/2018 11:17 AM

## 2018-05-03 NOTE — Progress Notes (Signed)
Awake and alert all day. C/o pain are mostly from "bladder or catheter" B&o suppository given with some relief. Foley has been irrigated x2 with minimal thin string clot first time and no clot evident second time. Foley irrigates and returns urine with ease. Urine is bloody orange. NG to LWIS with bile colored drainage. Reglan was started. This evening,  pt has urge to pass gas but states she has not yet. No bowel sounds auscultated. She has significant 3+ non pitting edema from waist down. Urine output and nephrostomy output increasing. Serum renal function worsening. 4 pm BMP result called to Dr Thedore MinsSingh. Today she dangled at side of bed with PT for a few minutes. She was placed on specialty tilt bed to assist with standing tomorrow.

## 2018-05-03 NOTE — Progress Notes (Signed)
Pharmacy Antibiotic Note  [name] is a 42 y.o. female admitted on 04/27/2018 with sepsis.  Pharmacy has been consulted for zosyn dosing.  6/12 Zosyn discontinued. Pharmacy now consulted for meropenem .   Plan: Patient's baseline Scr 0.9, Scr today is 5.3 .    Based on current CrCl <5925mL/min will continue patient on Meropenem 500mg  IV every 12 hours.   Temp (24hrs), Avg:97.9 F (36.6 C), Min:97.5 F (36.4 C), Max:98.2 F (36.8 C)  Recent Labs  Lab 04/28/18 2106  04/29/18 0503 04/29/18 2226 04/30/18 0628 04/30/18 1329 05/01/18 0533 05/02/18 0402 05/03/18 0158  WBC  --   --  16.1* 18.3* 19.7*  --  16.1* 18.6* 14.9*  CREATININE  --    < > 2.44* 2.90* 3.36* 3.71* 4.15* 4.99* 5.30*  LATICACIDVEN 4.2*  --  2.5* 1.8  --   --   --   --   --    < > = values in this interval not displayed.    Estimated Creatinine Clearance: 15.6 mL/min (A) (by C-G formula based on SCr of 5.3 mg/dL (H)).    No Known Allergies   Thank you for allowing pharmacy to be a part of this patient's care.  Gardner CandleSheema M Bob Daversa, PharmD, BCPS Clinical Pharmacist 05/03/2018 11:21 AM

## 2018-05-04 ENCOUNTER — Inpatient Hospital Stay: Payer: BLUE CROSS/BLUE SHIELD

## 2018-05-04 LAB — CBC
HEMATOCRIT: 22.5 % — AB (ref 35.0–47.0)
HEMOGLOBIN: 7.7 g/dL — AB (ref 12.0–16.0)
MCH: 30.5 pg (ref 26.0–34.0)
MCHC: 34.2 g/dL (ref 32.0–36.0)
MCV: 89.2 fL (ref 80.0–100.0)
Platelets: 171 10*3/uL (ref 150–440)
RBC: 2.52 MIL/uL — ABNORMAL LOW (ref 3.80–5.20)
RDW: 14.7 % — AB (ref 11.5–14.5)
WBC: 13 10*3/uL — ABNORMAL HIGH (ref 3.6–11.0)

## 2018-05-04 LAB — BASIC METABOLIC PANEL
ANION GAP: 18 — AB (ref 5–15)
BUN: 90 mg/dL — AB (ref 6–20)
CHLORIDE: 92 mmol/L — AB (ref 101–111)
CO2: 23 mmol/L (ref 22–32)
Calcium: 7.2 mg/dL — ABNORMAL LOW (ref 8.9–10.3)
Creatinine, Ser: 4.99 mg/dL — ABNORMAL HIGH (ref 0.44–1.00)
GFR calc Af Amer: 11 mL/min — ABNORMAL LOW (ref >60)
GFR calc non Af Amer: 10 mL/min — ABNORMAL LOW (ref >60)
GLUCOSE: 115 mg/dL — AB (ref 65–99)
POTASSIUM: 3.6 mmol/L (ref 3.5–5.1)
Sodium: 133 mmol/L — ABNORMAL LOW (ref 135–145)

## 2018-05-04 MED ORDER — DOCUSATE SODIUM 50 MG/5ML PO LIQD
100.0000 mg | Freq: Two times a day (BID) | ORAL | Status: DC
  Administered 2018-05-05 – 2018-05-06 (×2): 100 mg via ORAL
  Filled 2018-05-04 (×8): qty 10

## 2018-05-04 MED ORDER — ALBUMIN HUMAN 25 % IV SOLN
25.0000 g | Freq: Once | INTRAVENOUS | Status: AC
  Administered 2018-05-04: 25 g via INTRAVENOUS
  Filled 2018-05-04: qty 100

## 2018-05-04 MED ORDER — FUROSEMIDE 10 MG/ML IJ SOLN
120.0000 mg | Freq: Once | INTRAVENOUS | Status: AC
  Administered 2018-05-04: 120 mg via INTRAVENOUS
  Filled 2018-05-04: qty 10

## 2018-05-04 NOTE — Progress Notes (Signed)
PT PROFILE: [age] F with PMH of GERD, DVT, and DUB, underwent total hysterectomy and bilateral laparoscopic salpingectomy 06/07. 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 Developed hematuria and L hydronephrosis 06/09 requiring cystoscopy and left ureteral stent placement. Renal US 6/10 revealed right hydronephrosis. S/P R percutaneous nephrostomy tube placement 6/10  CTAP 6/12: Ileus pattern, mild left hydronephrosis persists, right hydronephrosis resolved, 5 cm hematoma within the hysterectomy bed (unchanged), moderate anasarca  SUBJ: Looks about the same today.  Some improvement with nausea.  NGT was not removed yesterday due to persistent nausea.  She is taking clear liquids by mouth but most of these are being suctioned back out by NG tube..  Denies dyspnea.  Abdominal pain improved.  Complains of pain associated with Foley catheter.  Urine output improving  Vitals:   05/04/18 0400 05/04/18 0500 05/04/18 0600 05/04/18 0747  BP: (!) 120/58 129/74 129/72   Pulse: (!) 106 96 94   Resp: 16 17 15    Temp: (!) 96.9 F (36.1 C)   (!) 97.4 F (36.3 C)  TempSrc: Axillary   Oral  SpO2: 99% 100% 98%   Weight:      2 LPM [place]  Gen: No overt distress HEENT: NGT in place, NCAT, sclerae white Neck: JVP not visualized Lungs: Clear anteriorly without wheezes or adventitious sounds Cardiovascular: Mild tachycardia, regular, no murmur Abdomen: distended, firm, BS markedly diminished to absent, no rebound tenderness Ext: Edema in all extremities, NSC Neuro: No focal neurologic deficits   BMP Latest Ref Rng & Units 05/04/2018 05/03/2018 05/03/2018  Glucose 65 - 99 mg/dL 161(W115(H) 960(A116(H) 540(J120(H)  BUN 6 - 20 mg/dL 81(X90(H) 91(Y94(H) 78(G87(H)  Creatinine 0.44 - 1.00 mg/dL 9.56(O4.99(H) 1.30(Q5.39(H) 6.57(Q5.30(H)  Sodium 135  - 145 mmol/L 133(L) 129(L) 130(L)  Potassium 3.5 - 5.1 mmol/L 3.6 3.9 4.2  Chloride 101 - 111 mmol/L 92(L) 89(L) 91(L)  CO2 22 - 32 mmol/L 23 21(L) 20(L)  Calcium 8.9 - 10.3 mg/dL 7.2(L) 7.1(L) 6.7(L)    CBC Latest Ref Rng & Units 05/04/2018 05/03/2018 05/02/2018  WBC 3.6 - 11.0 K/uL 13.0(H) 14.9(H) 18.6(H)  Hemoglobin 12.0 - 16.0 g/dL 7.7(L) 7.9(L) 9.2(L)  Hematocrit 35.0 - 47.0 % 22.5(L) 23.4(L) 26.9(L)  Platelets 150 - 440 K/uL 171 135(L) 132(L)     Results for orders placed or performed during the hospital encounter of 04/27/18  MRSA PCR Screening     Status: None   Collection Time: 04/27/18 10:27 PM  Result Value Ref Range Status   MRSA by PCR NEGATIVE NEGATIVE Final    Comment:        The GeneXpert MRSA Assay (FDA approved for NASAL specimens only), is one component of a comprehensive MRSA colonization surveillance program. It is not intended to diagnose MRSA infection nor to guide or monitor treatment for MRSA infections. Performed at [facility], [address] [place], [place] [id number]   CULTURE, BLOOD (ROUTINE X 2) w Reflex to ID Panel     Status: None   Collection Time: 04/28/18  9:24 PM  Result Value Ref Range Status   Specimen Description BLOOD RIGHT HAND  Final   Special Requests   Final    BOTTLES DRAWN AEROBIC AND ANAEROBIC Blood Culture adequate volume   Culture   Final  NO GROWTH 5 DAYS Performed at [facility], [address] [place], [place] [address]    Report Status 05/03/2018 FINAL  Final  CULTURE, BLOOD (ROUTINE X 2) w Reflex to ID Panel     Status: None   Collection Time: 04/28/18  9:44 PM  Result Value Ref Range Status   Specimen Description BLOOD RIGHT ANTECUBITAL  Final   Special Requests   Final    BOTTLES DRAWN AEROBIC AND ANAEROBIC Blood Culture adequate volume   Culture   Final    NO GROWTH 5 DAYS Performed at [facility], [address], [place], [place] [address]    Report Status 05/03/2018 FINAL   Final    Anti-infectives (From admission, onward)   Start     Dose/Rate Route Frequency Ordered Stop   05/02/18 1000  meropenem (MERREM) 500 mg in sodium chloride 0.9 % 100 mL IVPB     500 mg 200 mL/hr over 30 Minutes Intravenous Every 12 hours 05/02/18 0911     05/01/18 2200  piperacillin-tazobactam (ZOSYN) IVPB 3.375 g  Status:  Discontinued     3.375 g 12.5 mL/hr over 240 Minutes Intravenous Every 12 hours 05/01/18 1952 05/02/18 0838   04/30/18 2200  piperacillin-tazobactam (ZOSYN) IVPB 3.375 g  Status:  Discontinued     3.375 g 12.5 mL/hr over 240 Minutes Intravenous Every 12 hours 04/30/18 0833 05/01/18 1509   04/29/18 0330  piperacillin-tazobactam (ZOSYN) IVPB 3.375 g  Status:  Discontinued     3.375 g 12.5 mL/hr over 240 Minutes Intravenous Every 8 hours 04/29/18 0317 04/30/18 0833   04/27/18 0615  ceFAZolin (ANCEF) 2-4 GM/100ML-% IVPB    Note to Pharmacy:  Lorrene Reid   : cabinet override      04/27/18 0615 04/27/18 0746   04/27/18 0600  ceFAZolin (ANCEF) IVPB 2g/100 mL premix     2 g 200 mL/hr over 30 Minutes Intravenous On call to O.R. 04/26/18 2240 04/27/18 1142     CTAP 6/12: Ileus pattern, mild left hydronephrosis persists, right hydronephrosis resolved, 5 cm hematoma within the hysterectomy bed (unchanged), moderate anasarca  CXR: Minimal bibasilar atelectasis, distended bowels and upper abdomen  IMP: S/P hysterectomy 06/07 with intra-abdominal bleeding complications Severe hematuria - resolved Left hydronephrosis - Status post left ureteral stent Right hydronephrosis - Status post percutaneous nephrostomy tube AKI, oligo-anuric.  Slowly improving creatinine, down to 4.99. Hypervolemia/anasarca Acute blood loss anemia, not actively bleeding presently, hemoglobin is 7.7 Ileus with nausea Mildly elevated PCT  PLAN/REC: Repeat furosemide today with albumin Repeat BMET later today Continue NGT to LIWS Low-dose metoclopramide initiated 6/13 Reduced dose of  opiates as able Continue empiric meropenem Continue clear liquids as tolerated Continue antiemetics Discussed with nephrology   Tora Kindred, DO  05/04/2018 7:52 AM Patient ID: Jonelle Sidle, female   DOB: 1976-01-23, 42 y.o.   MRN: [id number]

## 2018-05-04 NOTE — Progress Notes (Signed)
Patient ID: [name], female   DOB: [birth year], 42 y.o.   MRN: [id number] 6 Days Post-Op  Procedure(s):  1. 04/28/18: ATTEMPTED LAPAROSCOPIC HYSTERECTOMY CONVERTED TO SUPRACERVICAL ABDOMINAL HYSTERECTOMY (N/A) SALPINGO OOPHORECTOMY (Left) CYSTOSCOPY (N/A) UNILATERAL SALPINGECTOMY (Right) LAPAROSCOPIC LYSIS OF ADHESIONS, RETROPERITONEAL DISSECTION  2. 04/29/18: CYSTOSCOPY WITH RETROGRADE PYELOGRAM/Left URETERAL STENT PLACEMENT, ureteroscopy (Bilateral)    Subjective: Pt still in ICU, s/p NG tube placed since 6/10 with decreased abdominal distension, improved and talking. Family at bedside, good support.  Hospital course:   6/15- NG tube clamped, Xray suggesting continued ileus, but nausea still resolved. +flatus, pt interested in eating. Tolerating clear liquid with NG tube clamped. - Creatinine lower for the first time today. Urine output improving, lasix and albumin being hung now. - Hgb dropping, no evidence of intraabdominal bleeding.    - Foley causing most pain, 0.5mg  dilaudid PRN    - Working with physical therapy    - s/p SCDs, now heparin and TED hose   6/14- On broad spectrum, lasix and albumin today, with improved urine output over the day. Creatinine still rising.    - Nausea improved with NG tube placement and now resolved.    - Foley causing most pain, 0.5mg  dilaudid PRN    - Working with physical therapy    - s/p SCDs, now heparin and TED hose  6/13 - CT scan , no blood loss in pelvis, unchanged 5cm hematoma, no mention of infection. - continuing tachycardic in sinus rhythm, now in 120s. 98-100% O2 sats on room air.   - Troponin bumped 0.29 04/30/18, now 0.15 05/01/18, no longer checking  - on 5000U of heparin q8 hrs for DVT ppx.  She was started on Zosyn for clinical dx of sepsis with persistent tachycardia, elevated lactic acid 2.5 (4.2) and elevated WBC to 16.1 (down from 17.3 just postop) , 3.375g q8hrs.  WBC 18.3 6/10 WBC 16.1 6/11 Her WBC bump today  prompted to broaden the spectrum, on meropenum.   H/H remains stable. Platelets stable, up to >100 (97) (87)      - s/p OR with Dr. Alvester Morin on 04/29/18 from urology for cystoscopy and left pyelogram and left ureteral stent placement for investigation of bladder and ureters after continued low urine output and bloody urine with pelvic pain. Previously, pt did have CT scan without contrast because of acute renal insufficiency to evaluate bladder, and no bladder injury noted. 5cm hematoma in pelvis noted on CT scan, which I would expect given the surgery. She has a left A-line in place.    Objective: Vital signs in last 24 hours: Temp:  [96.9 F (36.1 C)-98.2 F (36.8 C)] 98.2 F (36.8 C) (06/14 1230) Pulse Rate:  [94-109] 109 (06/14 1500) Resp:  [14-22] 22 (06/14 1500) BP: (94-139)/(54-100) 115/100 (06/14 1500) SpO2:  [91 %-100 %] 96 % (06/14 1500)  Intake/Output  Intake/Output Summary (Last 24 hours) at 05/04/2018 1537 Last data filed at 05/04/2018 1230 Gross per 24 hour  Intake 5848 ml  Output 40981 ml  Net -4712 ml    Physical Exam:  General: Oriented and responsive, appears less ill. CV: Regular rhythm, no murmurs, increased rate Lungs: Decreased breath sounds in bilateral bases, L>R GI: Tense, moderately-distended. Ecchymosis over mons deepening, and lap incisions clean and dry. +bowel sounds.   Incisions: Clean and dry, honeycomb dressing in place. Urine: Pink, Foley in place Extremities: Nontender, no erythema, significant edema in lower extremities less tense than yesterday, with a bola on left lateral arm  at site of prior IV, covered with a dressing. Fingers, hands and arms are no longer edematous.  Lab Results: Recent Labs    05/02/18 0402 05/03/18 0158 05/04/18 0447  HGB 9.2* 7.9* 7.7*  HCT 26.9* 23.4* 22.5*  WBC 18.6* 14.9* 13.0*  PLT 132* 135* 171                 Results for orders placed or performed during the hospital encounter of 04/27/18 (from the past  24 hour(s))  Basic metabolic panel     Status: Abnormal   Collection Time: 05/03/18  4:11 PM  Result Value Ref Range   Sodium 129 (L) 135 - 145 mmol/L   Potassium 3.9 3.5 - 5.1 mmol/L   Chloride 89 (L) 101 - 111 mmol/L   CO2 21 (L) 22 - 32 mmol/L   Glucose, Bld 116 (H) 65 - 99 mg/dL   BUN 94 (H) 6 - 20 mg/dL   Creatinine, Ser 1.615.39 (H) 0.44 - 1.00 mg/dL   Calcium 7.1 (L) 8.9 - 10.3 mg/dL   GFR calc non Af Amer 9 (L) >60 mL/min   GFR calc Af Amer 10 (L) >60 mL/min   Anion gap 19 (H) 5 - 15  Glucose, capillary     Status: Abnormal   Collection Time: 05/03/18  7:48 PM  Result Value Ref Range   Glucose-Capillary 112 (H) 65 - 99 mg/dL  Basic metabolic panel     Status: Abnormal   Collection Time: 05/04/18  4:47 AM  Result Value Ref Range   Sodium 133 (L) 135 - 145 mmol/L   Potassium 3.6 3.5 - 5.1 mmol/L   Chloride 92 (L) 101 - 111 mmol/L   CO2 23 22 - 32 mmol/L   Glucose, Bld 115 (H) 65 - 99 mg/dL   BUN 90 (H) 6 - 20 mg/dL   Creatinine, Ser 0.964.99 (H) 0.44 - 1.00 mg/dL   Calcium 7.2 (L) 8.9 - 10.3 mg/dL   GFR calc non Af Amer 10 (L) >60 mL/min   GFR calc Af Amer 11 (L) >60 mL/min   Anion gap 18 (H) 5 - 15  CBC     Status: Abnormal   Collection Time: 05/04/18  4:47 AM  Result Value Ref Range   WBC 13.0 (H) 3.6 - 11.0 K/uL   RBC 2.52 (L) 3.80 - 5.20 MIL/uL   Hemoglobin 7.7 (L) 12.0 - 16.0 g/dL   HCT 04.522.5 (L) 40.935.0 - 81.147.0 %   MCV 89.2 80.0 - 100.0 fL   MCH 30.5 26.0 - 34.0 pg   MCHC 34.2 32.0 - 36.0 g/dL   RDW 91.414.7 (H) 78.211.5 - 95.614.5 %   Platelets 171 150 - 440 K/uL    Assessment/Plan: 6 Days Post-Op        Procedure(s): 1. 04/28/18: ATTEMPTED LAPAROSCOPIC HYSTERECTOMY CONVERTED TO SUPRACERVICAL ABDOMINAL HYSTERECTOMY (N/A) SALPINGO OOPHORECTOMY (Left) CYSTOSCOPY (N/A) UNILATERAL SALPINGECTOMY (Right) LAPAROSCOPIC LYSIS OF ADHESIONS, RETROPERITONEAL DISSECTION   2. 04/29/18: CYSTOSCOPY WITH RETROGRADE PYELOGRAM/Left URETERAL STENT PLACEMENT, ureteroscopy (Bilateral)   Ileus:  abdominal distension, sips and chips, now with NG tube clamped, awaiting resolution  Acute renal insufficiency - managed by nephrology, lasix and albumin, s/p perc neph yesterday on right and has left stent in place. Hyperkalemia - resolved Leukocytosis - expected. S/p zosyn. Her procalcitonin raised slightly and WBC rise slightly, now on broader spectrum. No fever  Thrombocytopenia - stable Anemia- Hgb 7.7, monitor and replaced per protocol  Hematuria - foley in place. Appreciate urology and  nephrology recs.  Pain: s/p PCA. Will restart if needed, pain meds for ambulation with PT when able  Tachycardia - improved to <100. O2 sats 98-100 on room air.  DVT ppx: Heparin. TED recommended Third-spacing fluid in extremities - improved clinically, improving UOP.   When tolerated recommend out of bed to chair and Ambulate. Encourage Incentive spirometry.  -Working with PT Pain meds as needed to encourage out of bed.  Appreciate urology and nephrology assistance.  Appreciate intensivist involvement for monitoring and treatment.   Christeen Douglas, MD   LOS: 7 days   Christeen Douglas 05/04/2018, 3:37 PM

## 2018-05-04 NOTE — Progress Notes (Signed)
Initial Nutrition Assessment  DOCUMENTATION CODES:   Obesity unspecified  INTERVENTION:  Will supplement diet as advanced  NUTRITION DIAGNOSIS:   Inadequate oral intake related to acute illness(AKI, recent surgery) as evidenced by (On Clear Liquid Diet).  GOAL:   Patient will meet greater than or equal to 90% of their needs  MONITOR:   PO intake, Diet advancement, I & O's, Labs  REASON FOR ASSESSMENT:   NPO/Clear Liquid Diet    ASSESSMENT:   Pt with PMH GERD, DVT, and DUB, 6/7 underwent total hysterectomy, R salpingectomy, L salping-oophorectomy, lysis of adhesions, retroperitoneal dissection, pt with extensive blood loss, developed hemorrhagic shock requiring massive transfusion protocol. developed hematuria, L hydronephrosis, R hydronephrosis, underwent cytoscopy 6/9 and L ureteral stent, 6/10 R nephrostomy tube.   Spoke with patient and her husband at bedside.  She is currently in the ICU with an Ileus and AKI post-op. Patient with NGT to LIWS with 425mL output during RD exam. Exhibits 6850mL NGT output from 6/13-6/14 but she is consuming a large amount of liquids that is likely contributing to this. Her abdomen continues to be distended and firm, but patient reports gas and "rumbling." She also said she was hungry for the first time since admission. Patient reports a UBW of 205 pounds, and weighed that yesterday per chart, but currently she is exhibiting anasarca with severe pitting edema upon exam. Unsure of actual body weight at this time. 12.9L Fluid negative during time of assessment. Usual PO Intake consists of a protein bar for breakfast, a sandwich for lunch and a meal from crack[employer] or another restaurant for dinner. She often gets something like chicken tenders and broccoli. Drinks water and unsweetened tea, or half and half sweetened and unsweetened tea. Patient recently stopped drinking soda due to being "borderline diabetic."  Per 6/14 Xray ileus unchanged at  this time, however patient reports improved symptoms.  Labs reviewed:  Na 133, BUN/Cr 90/4.99  Medications reviewed and include:  Colace, Reglan, Miralax, Lasix  NUTRITION - FOCUSED PHYSICAL EXAM:    Most Recent Value  Orbital Region  No depletion  Upper Arm Region  No depletion  Thoracic and Lumbar Region  No depletion  Buccal Region  No depletion  Temple Region  No depletion  Clavicle Bone Region  No depletion  Clavicle and Acromion Bone Region  No depletion  Scapular Bone Region  No depletion  Dorsal Hand  No depletion  Patellar Region  No depletion  Anterior Thigh Region  No depletion  Posterior Calf Region  No depletion  Edema (RD Assessment)  Severe       Diet Order:   Diet Order           Diet clear liquid Room service appropriate? Yes; Fluid consistency: Thin  Diet effective 1000          EDUCATION NEEDS:   No education needs have been identified at this time  Skin:  Skin Assessment: Skin Integrity Issues: Skin Integrity Issues:: Incisions Incisions: Closed to abdomen, vagina, and perineum  Last BM:  PTA  Height:   Ht Readings from Last 1 Encounters:  05/04/18 5\' 5"  (1.651 m)    Weight:   Wt Readings from Last 1 Encounters:  05/03/18 205 lb (93 kg)    Ideal Body Weight:  56.81 kg  BMI:  Body mass index is 34.11 kg/m.  Estimated Nutritional Needs:   Kcal:  1991-2150 (MSJ x1.2-1.35)  Protein:  100-120 grams  Fluid:  UOP +55500mL  Satira Anis. Tiah Heckel, MS, RD LDN Inpatient Clinical Dietitian Pager [phone]

## 2018-05-04 NOTE — Progress Notes (Signed)
Pharmacy Antibiotic Note  [name] is a 42 y.o. female admitted on 04/27/2018 now s/p hysterectomy with intra-abdominal bleeding complications.  Pharmacy has been consulted for meropenem dosing.  Plan: Continue meropenem 500 mg iv q 12 hours.   Height: 5\' 5"  (165.1 cm)(stated by patient) Weight: 205 lb (93 kg) IBW/kg (Calculated) : 57  Temp (24hrs), Avg:97.7 F (36.5 C), Min:96.9 F (36.1 C), Max:98.2 F (36.8 C)  Recent Labs  Lab 04/28/18 2106  04/29/18 0503 04/29/18 2226 04/30/18 [id number]  05/01/18 0533 05/02/18 0402 05/03/18 0158 05/03/18 1611 05/04/18 0447  WBC  --   --  16.1* 18.3* 19.7*  --  16.1* 18.6* 14.9*  --  13.0*  CREATININE  --    < > 2.44* 2.90* 3.36*   < > 4.15* 4.99* 5.30* 5.39* 4.99*  LATICACIDVEN 4.2*  --  2.5* 1.8  --   --   --   --   --   --   --    < > = values in this interval not displayed.    Estimated Creatinine Clearance: 16.6 mL/min (A) (by C-G formula based on SCr of 4.99 mg/dL (H)).    No Known Allergies  Antimicrobials this admission: Zosyn 6/9 >> 6/12 meropenem 6/12 >>   Dose adjustments this admission:   Microbiology results: 6/8 BCx: negative 6/7 MRSA PCR: negative  Thank you for allowing pharmacy to be a part of this patient's care.  [name], [name] 05/04/2018 4:19 PM

## 2018-05-04 NOTE — Progress Notes (Signed)
[facility], [place] 05/04/18  Subjective:   Patient underwent abdominal hysterectomy, left salpingo-oophorectomy, laparoscopic lysis of adhesions and retroperitoneal dissection on 04/28/2018.  Followed by cystoscopy with retrograde pyelogram and left ureteral stent placement on 04/29/2018 Baseline creatinine is 0.79 from 04/19/2018 Patient has developed postoperative acute renal failure, left hydronephrosis for which left ureteral stent was placed on June 9; Rt hydronephrosis noted on renal U/S; Rt nephrostomy tube placed 9/10  Taking sips of clears. Still has large out put from NG tube (> 6 L) but also drinking clears  UOP is better  BUN/Cr are starting to get better today   Objective:  Vital signs in last 24 hours:  Temp:  [96.9 F (36.1 C)-98 F (36.7 C)] 97.4 F (36.3 C) (06/14 0747) Pulse Rate:  [94-108] 95 (06/14 0900) Resp:  [15-21] 19 (06/14 0900) BP: (94-139)/(58-87) 94/66 (06/14 0900) SpO2:  [92 %-100 %] 100 % (06/14 0900)  Weight change:  Filed Weights   05/03/18 0800  Weight: 93 kg (205 lb)    Intake/Output:    Intake/Output Summary (Last 24 hours) at 05/04/2018 0948 Last data filed at 05/04/2018 0900 Gross per 24 hour  Intake 4908 ml  Output 16109 ml  Net -5127 ml     Physical Exam: General: Ill appearing, laying in bed  HEENT Dry oral mucus membranes, NG tube  Neck supple  Pulm/lungs Decreased breath sounds at bases  CVS/Heart Tachycardic, regular  Abdomen:  Distended, sluggish bowel sounds  Extremities: +++ tight edema  Neurologic: Alert, oriented  Skin: No acute rashes   Foley in place, rt nephrostomy       Basic Metabolic Panel:  Recent Labs  Lab 04/29/18 2226 04/30/18 6045  05/01/18 0533 05/02/18 0402 05/03/18 0158 05/03/18 1611 05/04/18 0447  NA 133* 132*   < > 131* 133* 130* 129* 133*  K 4.2 4.2   < > 4.4 4.7 4.2 3.9 3.6  CL 96* 96*   < > 94* 95* 91* 89* 92*  CO2 24 23   < > 22 21* 20* 21* 23  GLUCOSE  165* 167*   < > 151* 141* 120* 116* 115*  BUN 36* 44*   < > 59* 81* 87* 94* 90*  CREATININE 2.90* 3.36*   < > 4.15* 4.99* 5.30* 5.39* 4.99*  CALCIUM 6.5* 6.5*   < > 6.4* 6.5* 6.7* 7.1* 7.2*  MG 1.4* 2.7*  --   --   --   --   --   --   PHOS 4.2  --   --   --   --   --   --   --    < > = values in this interval not displayed.     CBC: Recent Labs  Lab 04/29/18 0503  04/30/18 4098 05/01/18 0533 05/02/18 0402 05/03/18 0158 05/04/18 0447  WBC 16.1*   < > 19.7* 16.1* 18.6* 14.9* 13.0*  NEUTROABS 14.5*  --   --   --   --   --   --   HGB 10.6*   < > 10.3* 9.7* 9.2* 7.9* 7.7*  HCT 31.1*   < > 30.6* 28.5* 26.9* 23.4* 22.5*  MCV 90.2   < > 89.8 90.3 90.1 89.4 89.2  PLT 105*   < > 99* 97* 132* 135* 171   < > = values in this interval not displayed.     No results found for: HEPBSAG, HEPBSAB, HEPBIGM    Microbiology:  Recent Results (from  the past 240 hour(s))  MRSA PCR Screening     Status: None   Collection Time: 04/27/18 10:27 PM  Result Value Ref Range Status   MRSA by PCR NEGATIVE NEGATIVE Final    Comment:        The GeneXpert MRSA Assay (FDA approved for NASAL specimens only), is one component of a comprehensive MRSA colonization surveillance program. It is not intended to diagnose MRSA infection nor to guide or monitor treatment for MRSA infections. Performed at [facility], [address] [place], [place] [address]   CULTURE, BLOOD (ROUTINE X 2) w Reflex to ID Panel     Status: None   Collection Time: 04/28/18  9:24 PM  Result Value Ref Range Status   Specimen Description BLOOD RIGHT HAND  Final   Special Requests   Final    BOTTLES DRAWN AEROBIC AND ANAEROBIC Blood Culture adequate volume   Culture   Final    NO GROWTH 5 DAYS Performed at [facility], [address] [place], [place] [address]    Report Status 05/03/2018 FINAL  Final  CULTURE, BLOOD (ROUTINE X 2) w Reflex to ID Panel     Status: None   Collection Time: 04/28/18  9:44  PM  Result Value Ref Range Status   Specimen Description BLOOD RIGHT ANTECUBITAL  Final   Special Requests   Final    BOTTLES DRAWN AEROBIC AND ANAEROBIC Blood Culture adequate volume   Culture   Final    NO GROWTH 5 DAYS Performed at [facility], [address] [place], [place] [address]    Report Status 05/03/2018 FINAL  Final    Coagulation Studies: No results for input(s): LABPROT, INR in the last 72 hours.  Urinalysis: No results for input(s): COLORURINE, LABSPEC, PHURINE, GLUCOSEU, HGBUR, BILIRUBINUR, KETONESUR, PROTEINUR, UROBILINOGEN, NITRITE, LEUKOCYTESUR in the last 72 hours.  Invalid input(s): APPERANCEUR    Imaging: Ct Abdomen Pelvis Wo Contrast  Result Date: 05/02/2018 CLINICAL DATA:  Bowel obstruction. Recent total hysterectomy complicated by hemorrhagic shock. EXAM: CT ABDOMEN AND PELVIS WITHOUT CONTRAST TECHNIQUE: Multidetector CT imaging of the abdomen and pelvis was performed following the standard protocol without IV contrast. COMPARISON:  CT abdomen pelvis dated April 28, 2018. FINDINGS: Lower chest: Right greater than left basilar atelectasis. Hepatobiliary: No focal liver abnormality is seen. No gallstones, gallbladder wall thickening, or biliary dilatation. Pancreas: Unremarkable. No pancreatic ductal dilatation or surrounding inflammatory changes. Spleen: Normal in size without focal abnormality. Adrenals/Urinary Tract: The adrenal glands are unremarkable. Interval placement of a right percutaneous nephrostomy tube with the tip in the proximal ureter. No right hydronephrosis. Interval placement of a left double-J ureteral stent, with unchanged mild left hydronephrosis. The bladder is decompressed by Foley catheter. Stomach/Bowel: Distended, contrast filled distal esophagus and stomach. Enteric tube within the gastric body. Moderately dilated loops of proximal small bowel, with gradual transition to nondilated small bowel in the left abdomen. Distal small  bowel loops are decompressed. The colon is unremarkable. Vascular/Lymphatic: No enlarged abdominal or pelvic lymph nodes. Prominent bilateral pelvic varices again noted. Reproductive: Status post hysterectomy. Relatively unchanged 5.0 x 4.8 cm hematoma within the hysterectomy bed. Other: Mild presacral inflammatory changes, similar to prior study. Postsurgical changes and resolving subcutaneous emphysema in the lower abdominal wall. New moderate anasarca. Small amount of free fluid in the left paracolic gutter and pelvis. No pneumoperitoneum. Musculoskeletal: No acute or significant osseous findings. IMPRESSION: 1. Moderately dilated loops of proximal small bowel with gradual transition to nondilated  small bowel in the left abdomen. Findings are favored to reflect ileus versus partial small bowel obstruction. 2. Persistent mild left hydronephrosis status post left double-J ureteral stent placement. 3. Resolved right hydronephrosis status post right percutaneous nephrostomy tube placement. 4. Unchanged 5.0 cm hematoma within the hysterectomy bed. 5. New moderate anasarca. Electronically Signed   By: Obie Dredge M.D.   On: 05/02/2018 12:18   Dg Chest Port 1 View  Result Date: 05/03/2018 CLINICAL DATA:  Respiratory failure EXAM: PORTABLE CHEST 1 VIEW COMPARISON:  04/29/2018 FINDINGS: Mild left lower lobe atelectasis unchanged. Progression of right lower lobe atelectasis. Negative for edema or effusion. Central venous catheter tip in the SVC. NG tube in the stomach. IMPRESSION: Bibasilar atelectasis, with progression of right lower lobe atelectasis. Electronically Signed   By: Marlan Palau M.D.   On: 05/03/2018 07:03     Medications:   . meropenem (MERREM) IV Stopped (05/03/18 2227)   . docusate sodium  100 mg Oral BID  . heparin injection (subcutaneous)  5,000 Units Subcutaneous Q8H  . mouth rinse  15 mL Mouth Rinse BID  . metoCLOPramide (REGLAN) injection  5 mg Intravenous Q8H  . pantoprazole  (PROTONIX) IV  40 mg Intravenous Q24H  . polyethylene glycol  17 g Oral Daily  . sodium chloride flush  10-40 mL Intracatheter Q12H   HYDROmorphone (DILAUDID) injection, lidocaine, menthol-cetylpyridinium, metoprolol tartrate, [DISCONTINUED] ondansetron **OR** ondansetron (ZOFRAN) IV, opium-belladonna, phenazopyridine, phenol, promethazine, sodium chloride flush  Assessment/ Plan:  42 y.o. caucasian female is post op abdominal hysterectomy, left salpingo-oophorectomy, laparoscopic lysis of adhesions and retroperitoneal dissection on 04/28/2018.  Followed by cystoscopy with retrograde pyelogram and left ureteral stent placement on 04/29/2018 Baseline creatinine is 0.79 from 04/19/2018 Post op case complicated by ARF, b/l  hydronephrosis  1. ARF, likely multifactorial from severe ATN and b/l hydronephrosis. - Left ureteral stent in place - rt nephrostomy placed 6/10 -  UOP ~im[proved last 24 hrs (735 nephrostomy), + 1460 in foley - S Creatinine is starting to improve.  She is currently on room air. Electrolytes are acceptable. No acute indication to start CRRT/IHD. Trial of lasix/Albumin again today  2. Generalized edema - quite severe - does not appear to affecting respiratory status - will continue to monitor closely      LOS: 7 Amaad Byers Thedore Mins 6/14/20199:48 AM  [facility] [place], [place] [phone]  Note: This note was prepared with Dragon dictation. Any transcription errors are unintentional

## 2018-05-04 NOTE — Progress Notes (Signed)
Patient had no complaints of nausea this shift. Did complain of pain in bladder/lower abdomen, relieved by prn dilaudid, pt did not want the bladder spasm medications ordered (did try the pyridium prn medication but stated didn't feel like it helped). 900 mL out through nephrostomy tube of amber clear urine. 475 mL out through foley catheter, red to amber with occasional clots. Irrigated twice during shift but all irrigation fluid withdrawn. Patient drank a lot of fluids (a little over 2L) during shift, 850 out through NG before Dr. Lonn Georgiaonforti ordered it to be clamped after reviewing abdominal x-ray.

## 2018-05-04 NOTE — Progress Notes (Signed)
Physical Therapy Treatment Patient Details Name: [name] MRN: [id number] DOB: [birth year] Today's Date: 05/04/2018    History of Present Illness Pt is a 42 y/o F s/p laparascopic hysterectomy, Bil aslpingectomy 6/7.  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  Developed hematuria and L hydronephrosis 06/09 requiring cystoscopy and left ureteral stent placement. Renal US 6/10 revealed right hydronephrosis. S/P R percutaneous nephrostomy tube placement 6/10.  NG tube placed 6/10.  Pt with AKI and ileus.  Pt's PMH includes DVT.     PT Comments    Pt sleeping upon PT arrival but woken with vc's and agreeable to therapy session.  Clot noted in foley catheter and nurse came beginning of session to address.  No c/o pain beginning of session but pt reporting increased abdominal incision pain sitting on edge of bed (end of session pain 4-5/10 and pt requesting pain medication; nursing notified).  Pt requiring 1-2 assist with bed mobility and only able to sit on edge of bed for approximately 4 minutes with assist d/t above noted incisional pain and fatigue.  At lowest bed position, pt's tilt bed too high for pt to safely attempt standing today; may need platform step so pt's feet can reach surface and promote safe standing attempt.  Increased time required to manage pt's multiple lines during session.  Will continue to progress pt with strengthening, sitting time/tolerance, and progressive functional mobility per pt tolerance.   Follow Up Recommendations  CIR     Equipment Recommendations  Rolling walker with 5" wheels    Recommendations for Other Services Rehab consult     Precautions / Restrictions Precautions Precautions: Fall;Other (comment) Precaution Comments: ureteral drain, R nephrostomy tube, NG tube, L IJ CVC line Restrictions Weight Bearing Restrictions: No    Mobility  Bed  Mobility Overal bed mobility: Needs Assistance Bed Mobility: Supine to Sit;Sit to Supine     Supine to sit: Max assist;HOB elevated Sit to supine: Mod assist;+2 for physical assistance;HOB elevated   General bed mobility comments: assist for trunk and B LE's supine to/from sit; vc's for technique; increased time to perform  Transfers                 General transfer comment: deferred d/t pt fatigued sitting edge of bed and c/o incisional pain  Ambulation/Gait             General Gait Details: deferred d/t pt fatigued sitting edge of bed and c/o incisional pain   Stairs             Wheelchair Mobility    Modified Rankin (Stroke Patients Only)       Balance Overall balance assessment: Needs assistance Sitting-balance support: Bilateral upper extremity supported;Feet supported Sitting balance-Leahy Scale: Poor Sitting balance - Comments: pt requiring CGA to mod assist for sitting balance d/t fatigue                                    Cognition Arousal/Alertness: Awake/alert Behavior During Therapy: WFL for tasks assessed/performed Overall Cognitive Status: Within Functional Limits for tasks assessed                                        Exercises      General Comments  General comments (skin integrity, edema, etc.): All lines/leads in place at start and at end of session.  Nursing cleared pt for participation in physical therapy.  Pt agreeable to PT session.  Pt's husband left for PT session.      Pertinent Vitals/Pain Pain Assessment: 0-10 Pain Score: 5  Pain Location: abdominal incision Pain Descriptors / Indicators: Sore;Aching;Grimacing Pain Intervention(s): Limited activity within patient's tolerance;Monitored during session;Repositioned;Patient requesting pain meds-RN notified  Vitals (HR and O2) stable and WFL throughout treatment session.    Home Living                      Prior Function             PT Goals (current goals can now be found in the care plan section) Acute Rehab PT Goals Patient Stated Goal: to return to PLOF as quickly as possible PT Goal Formulation: With patient Time For Goal Achievement: 05/16/18 Potential to Achieve Goals: Good Progress towards PT goals: Progressing toward goals    Frequency    7X/week      PT Plan Current plan remains appropriate    Co-evaluation              AM-PAC PT "6 Clicks" Daily Activity  Outcome Measure  Difficulty turning over in bed (including adjusting bedclothes, sheets and blankets)?: Unable Difficulty moving from lying on back to sitting on the side of the bed? : Unable   Help needed moving to and from a bed to chair (including a wheelchair)?: Total Help needed walking in hospital room?: Total Help needed climbing 3-5 steps with a railing? : Total 6 Click Score: 5    End of Session   Activity Tolerance: Patient limited by fatigue;Patient limited by pain Patient left: in bed;with call bell/phone within reach;with family/visitor present;Other (comment)(B heels elevated via pillows) Nurse Communication: Mobility status;Patient requests pain meds;Precautions PT Visit Diagnosis: Muscle weakness (generalized) (M62.81);Difficulty in walking, not elsewhere classified (R26.2);Other abnormalities of gait and mobility (R26.89)     Time: [id number] PT Time Calculation (min) (ACUTE ONLY): 48 min  Charges:  $Therapeutic Exercise: 23-37 mins $Therapeutic Activity: 8-22 mins                    G CodesHendricks Limes:       Omni Dunsworth, PT 05/04/18, 5:07 PM [phone]

## 2018-05-05 LAB — BPAM RBC
BLOOD PRODUCT EXPIRATION DATE: 201906282359
BLOOD PRODUCT EXPIRATION DATE: 201906302359
BLOOD PRODUCT EXPIRATION DATE: 201906302359
BLOOD PRODUCT EXPIRATION DATE: 201907052359
Blood Product Expiration Date: 201906282359
Blood Product Expiration Date: 201906292359
Blood Product Expiration Date: 201907042359
Blood Product Expiration Date: 201907052359
ISSUE DATE / TIME: 201906071121
ISSUE DATE / TIME: 201906071121
ISSUE DATE / TIME: 201906071210
ISSUE DATE / TIME: 201906071306
ISSUE DATE / TIME: 201906121011
ISSUE DATE / TIME: 201906071210
ISSUE DATE / TIME: 201906141018
UNIT TYPE AND RH: 5100
UNIT TYPE AND RH: 5100
Unit Type and Rh: 5100
Unit Type and Rh: 5100
Unit Type and Rh: 5100
Unit Type and Rh: 5100
Unit Type and Rh: 5100
Unit Type and Rh: 5100

## 2018-05-05 LAB — COMPREHENSIVE METABOLIC PANEL
ALK PHOS: 134 U/L — AB (ref 38–126)
ALT: 28 U/L (ref 14–54)
ANION GAP: 15 (ref 5–15)
AST: 67 U/L — ABNORMAL HIGH (ref 15–41)
Albumin: 3.8 g/dL (ref 3.5–5.0)
BUN: 91 mg/dL — ABNORMAL HIGH (ref 6–20)
CO2: 24 mmol/L (ref 22–32)
CREATININE: 3.74 mg/dL — AB (ref 0.44–1.00)
Calcium: 7 mg/dL — ABNORMAL LOW (ref 8.9–10.3)
Chloride: 89 mmol/L — ABNORMAL LOW (ref 101–111)
GFR, EST AFRICAN AMERICAN: 16 mL/min — AB (ref >60)
GFR, EST NON AFRICAN AMERICAN: 14 mL/min — AB (ref >60)
Glucose, Bld: 136 mg/dL — ABNORMAL HIGH (ref 65–99)
Potassium: 2.9 mmol/L — ABNORMAL LOW (ref 3.5–5.1)
Sodium: 128 mmol/L — ABNORMAL LOW (ref 135–145)
Total Bilirubin: 1.5 mg/dL — ABNORMAL HIGH (ref 0.3–1.2)
Total Protein: 6.6 g/dL (ref 6.5–8.1)

## 2018-05-05 LAB — TYPE AND SCREEN
ABO/RH(D): O POS
Antibody Screen: POSITIVE
Donor AG Type: NEGATIVE
Donor AG Type: NEGATIVE
Donor AG Type: NEGATIVE
Donor AG Type: NEGATIVE
PT AG TYPE: NEGATIVE
UNIT DIVISION: 0
UNIT DIVISION: 0
UNIT DIVISION: 0
Unit division: 0
Unit division: 0
Unit division: 0
Unit division: 0
Unit division: 0

## 2018-05-05 LAB — PHOSPHORUS: PHOSPHORUS: 5.7 mg/dL — AB (ref 2.5–4.6)

## 2018-05-05 LAB — BASIC METABOLIC PANEL
Anion gap: 16 — ABNORMAL HIGH (ref 5–15)
BUN: 86 mg/dL — AB (ref 6–20)
CALCIUM: 7 mg/dL — AB (ref 8.9–10.3)
CO2: 24 mmol/L (ref 22–32)
CREATININE: 3.23 mg/dL — AB (ref 0.44–1.00)
Chloride: 88 mmol/L — ABNORMAL LOW (ref 101–111)
GFR calc non Af Amer: 17 mL/min — ABNORMAL LOW (ref >60)
GFR, EST AFRICAN AMERICAN: 19 mL/min — AB (ref >60)
Glucose, Bld: 135 mg/dL — ABNORMAL HIGH (ref 65–99)
Potassium: 3.1 mmol/L — ABNORMAL LOW (ref 3.5–5.1)
Sodium: 128 mmol/L — ABNORMAL LOW (ref 135–145)

## 2018-05-05 LAB — CBC
HCT: 22.2 % — ABNORMAL LOW (ref 35.0–47.0)
HEMOGLOBIN: 7.7 g/dL — AB (ref 12.0–16.0)
MCH: 30.9 pg (ref 26.0–34.0)
MCHC: 34.4 g/dL (ref 32.0–36.0)
MCV: 89.9 fL (ref 80.0–100.0)
PLATELETS: 208 10*3/uL (ref 150–440)
RBC: 2.48 MIL/uL — AB (ref 3.80–5.20)
RDW: 14.5 % (ref 11.5–14.5)
WBC: 11.9 10*3/uL — ABNORMAL HIGH (ref 3.6–11.0)

## 2018-05-05 LAB — MAGNESIUM: MAGNESIUM: 2.7 mg/dL — AB (ref 1.7–2.4)

## 2018-05-05 LAB — POTASSIUM: Potassium: 3 mmol/L — ABNORMAL LOW (ref 3.5–5.1)

## 2018-05-05 MED ORDER — POTASSIUM CHLORIDE 20 MEQ PO PACK
60.0000 meq | PACK | Freq: Once | ORAL | Status: AC
  Administered 2018-05-05: 60 meq via ORAL
  Filled 2018-05-05: qty 3

## 2018-05-05 MED ORDER — NYSTATIN 100000 UNIT/ML MT SUSP
5.0000 mL | Freq: Four times a day (QID) | OROMUCOSAL | Status: DC
  Administered 2018-05-05 – 2018-05-19 (×37): 500000 [IU] via ORAL
  Filled 2018-05-05 (×46): qty 5

## 2018-05-05 MED ORDER — POTASSIUM CHLORIDE 10 MEQ/100ML IV SOLN
10.0000 meq | INTRAVENOUS | Status: AC
  Administered 2018-05-05 (×2): 10 meq via INTRAVENOUS
  Filled 2018-05-05 (×2): qty 100

## 2018-05-05 MED ORDER — POTASSIUM CHLORIDE 10 MEQ/50ML IV SOLN
10.0000 meq | INTRAVENOUS | Status: AC
  Administered 2018-05-05 (×2): 10 meq via INTRAVENOUS
  Filled 2018-05-05 (×2): qty 50

## 2018-05-05 NOTE — Progress Notes (Signed)
Physical Therapy Treatment Patient Details Name: Jonelle SidleGinger B Stick MRN: [id number] DOB: 02/11/1976 Today's Date: 05/05/2018    History of Present Illness Pt is a 42 y/o F s/p laparascopic hysterectomy, Bil aslpingectomy 6/7.  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  Developed hematuria and L hydronephrosis 06/09 requiring cystoscopy and left ureteral stent placement. Renal US 6/10 revealed right hydronephrosis. S/P R percutaneous nephrostomy tube placement 6/10.  NG tube placed 6/10.  Pt with AKI and ileus.  Pt's PMH includes DVT.     PT Comments    Pt had recently finished with VitalGo bed trial with nursing prior to arrival.  She reported general fatigue.  RN stated total time was limited to around 1 minute tolerance.  Pt stated she was quite fearful which limited her tolerance.  Education and encouragement were provided.  While she was fatigued, she agreed to bedside exercises as below.  LE's remain with edema and pt reports "They feel tight".  She stated she has been doing ankle pumps and other LE ex as tolerated on her own.  Nursing reports they will try VitalGo bed again this afternoon.    Pt remains highly motivated despite fatigue and CIR remains appropriate upon discharge.   Follow Up Recommendations  CIR     Equipment Recommendations  Rolling walker with 5" wheels    Recommendations for Other Services Rehab consult     Precautions / Restrictions Precautions Precautions: Fall;Other (comment) Precaution Comments: ureteral drain, R nephrostomy tube, NG tube, L IJ CVC line Restrictions Weight Bearing Restrictions: No    Mobility  Bed Mobility               General bed mobility comments: deferred this session  Transfers                    Ambulation/Gait                 Stairs             Wheelchair Mobility    Modified Rankin (Stroke  Patients Only)       Balance                                            Cognition Arousal/Alertness: Awake/alert Behavior During Therapy: WFL for tasks assessed/performed Overall Cognitive Status: Within Functional Limits for tasks assessed                                        Exercises Other Exercises Other Exercises: BLE AAROM for ankle pumps, heel slides, ab/add SLR 2 x 10    General Comments        Pertinent Vitals/Pain Pain Assessment: 0-10 Pain Score: 5  Pain Location: abdominal  Pain Descriptors / Indicators: Sore;Operative site guarding Pain Intervention(s): Limited activity within patient's tolerance;Monitored during session    Home Living                      Prior Function            PT Goals (current goals can now be found in the care plan section) Progress towards PT goals: Progressing toward goals    Frequency  7X/week      PT Plan Current plan remains appropriate    Co-evaluation              AM-PAC PT "6 Clicks" Daily Activity  Outcome Measure  Difficulty turning over in bed (including adjusting bedclothes, sheets and blankets)?: Unable Difficulty moving from lying on back to sitting on the side of the bed? : Unable Difficulty sitting down on and standing up from a chair with arms (e.g., wheelchair, bedside commode, etc,.)?: Unable Help needed moving to and from a bed to chair (including a wheelchair)?: Total Help needed walking in hospital room?: Total Help needed climbing 3-5 steps with a railing? : Total 6 Click Score: 6    End of Session   Activity Tolerance: Patient limited by fatigue;Patient limited by pain Patient left: in bed;with call bell/phone within reach;with family/visitor present Nurse Communication: Mobility status       Time: 1610-9604 PT Time Calculation (min) (ACUTE ONLY): 12 min  Charges:  $Therapeutic Exercise: 8-22 mins                    G Codes:        Danielle Dess, PTA 05/05/18, 12:38 PM'

## 2018-05-05 NOTE — Progress Notes (Signed)
[facility], [place] 05/05/18  Subjective:   Patient underwent abdominal hysterectomy, left salpingo-oophorectomy, laparoscopic lysis of adhesions and retroperitoneal dissection on 04/28/2018.  Followed by cystoscopy with retrograde pyelogram and left ureteral stent placement on 04/29/2018 Baseline creatinine is 0.79 from 04/19/2018 Patient has developed postoperative acute renal failure, left hydronephrosis for which left ureteral stent was placed on June 9; Rt hydronephrosis noted on renal U/S; Rt nephrostomy tube placed 9/10  Taking sips of clears. NG tube is out UOP and BUN/Cr have improved significantly K is low   Objective:  Vital signs in last 24 hours:  Temp:  [97.6 F (36.4 C)-99.8 F (37.7 C)] 99.8 F (37.7 C) (06/15 0800) Pulse Rate:  [88-109] 95 (06/15 0800) Resp:  [14-22] 21 (06/15 0800) BP: (94-137)/(54-100) 127/62 (06/15 0800) SpO2:  [91 %-100 %] 97 % (06/15 0800)  Weight change:  Filed Weights   05/03/18 0800  Weight: 93 kg (205 lb)    Intake/Output:    Intake/Output Summary (Last 24 hours) at 05/05/2018 0858 Last data filed at 05/05/2018 0600 Gross per 24 hour  Intake 2693.33 ml  Output 5125 ml  Net -2431.67 ml     Physical Exam: General: Ill appearing, laying in bed  HEENT Moist oral mucus membranes  Neck supple  Pulm/lungs Decreased breath sounds at bases  CVS/Heart Tachycardic, regular  Abdomen:  Distended, sluggish bowel sounds  Extremities: +++ tight edema  Neurologic: Alert, oriented  Skin: No acute rashes   Foley in place, rt nephrostomy       Basic Metabolic Panel:  Recent Labs  Lab 04/29/18 2226 04/30/18 1610  05/02/18 0402 05/03/18 0158 05/03/18 1611 05/04/18 0447 05/05/18 0637  NA 133* 132*   < > 133* 130* 129* 133* 128*  K 4.2 4.2   < > 4.7 4.2 3.9 3.6 2.9*  CL 96* 96*   < > 95* 91* 89* 92* 89*  CO2 24 23   < > 21* 20* 21* 23 24  GLUCOSE 165* 167*   < > 141* 120* 116* 115* 136*  BUN 36* 44*   <  > 81* 87* 94* 90* 91*  CREATININE 2.90* 3.36*   < > 4.99* 5.30* 5.39* 4.99* 3.74*  CALCIUM 6.5* 6.5*   < > 6.5* 6.7* 7.1* 7.2* 7.0*  MG 1.4* 2.7*  --   --   --   --   --  2.7*  PHOS 4.2  --   --   --   --   --   --  5.7*   < > = values in this interval not displayed.     CBC: Recent Labs  Lab 04/29/18 0503  05/01/18 0533 05/02/18 0402 05/03/18 0158 05/04/18 0447 05/05/18 0637  WBC 16.1*   < > 16.1* 18.6* 14.9* 13.0* 11.9*  NEUTROABS 14.5*  --   --   --   --   --   --   HGB 10.6*   < > 9.7* 9.2* 7.9* 7.7* 7.7*  HCT 31.1*   < > 28.5* 26.9* 23.4* 22.5* 22.2*  MCV 90.2   < > 90.3 90.1 89.4 89.2 89.9  PLT 105*   < > 97* 132* 135* 171 208   < > = values in this interval not displayed.     No results found for: HEPBSAG, HEPBSAB, HEPBIGM    Microbiology:  Recent Results (from the past 240 hour(s))  MRSA PCR Screening     Status: None   Collection Time: 04/27/18 10:27  PM  Result Value Ref Range Status   MRSA by PCR NEGATIVE NEGATIVE Final    Comment:        The GeneXpert MRSA Assay (FDA approved for NASAL specimens only), is one component of a comprehensive MRSA colonization surveillance program. It is not intended to diagnose MRSA infection nor to guide or monitor treatment for MRSA infections. Performed at [facility], [address] [place], [place] [address]   CULTURE, BLOOD (ROUTINE X 2) w Reflex to ID Panel     Status: None   Collection Time: 04/28/18  9:24 PM  Result Value Ref Range Status   Specimen Description BLOOD RIGHT HAND  Final   Special Requests   Final    BOTTLES DRAWN AEROBIC AND ANAEROBIC Blood Culture adequate volume   Culture   Final    NO GROWTH 5 DAYS Performed at [facility], [address] [place], [place] [address]    Report Status 05/03/2018 FINAL  Final  CULTURE, BLOOD (ROUTINE X 2) w Reflex to ID Panel     Status: None   Collection Time: 04/28/18  9:44 PM  Result Value Ref Range Status   Specimen Description  BLOOD RIGHT ANTECUBITAL  Final   Special Requests   Final    BOTTLES DRAWN AEROBIC AND ANAEROBIC Blood Culture adequate volume   Culture   Final    NO GROWTH 5 DAYS Performed at [facility], [address] [place], [place] [address]    Report Status 05/03/2018 FINAL  Final    Coagulation Studies: No results for input(s): LABPROT, INR in the last 72 hours.  Urinalysis: No results for input(s): COLORURINE, LABSPEC, PHURINE, GLUCOSEU, HGBUR, BILIRUBINUR, KETONESUR, PROTEINUR, UROBILINOGEN, NITRITE, LEUKOCYTESUR in the last 72 hours.  Invalid input(s): APPERANCEUR    Imaging: Dg Abd 1 View  Result Date: 05/04/2018 CLINICAL DATA:  Ileus EXAM: ABDOMEN - 1 VIEW COMPARISON:  05/02/2018 CT FINDINGS: Right nephrostomy tube and left ureteral stents remain in place, unchanged. Diffuse gaseous distention of bowel compatible with ileus, unchanged. NG tube coils in the fundus of the stomach. Lung bases clear. IMPRESSION: Continued gaseous distention of bowel diffusely compatible with ileus. Electronically Signed   By: Charlett NoseKevin  Dover M.D.   On: 05/04/2018 10:51     Medications:   . meropenem (MERREM) IV Stopped (05/04/18 2245)  . potassium chloride     . docusate  100 mg Oral BID  . heparin injection (subcutaneous)  5,000 Units Subcutaneous Q8H  . mouth rinse  15 mL Mouth Rinse BID  . metoCLOPramide (REGLAN) injection  5 mg Intravenous Q8H  . nystatin  5 mL Oral QID  . pantoprazole (PROTONIX) IV  40 mg Intravenous Q24H  . polyethylene glycol  17 g Oral Daily  . sodium chloride flush  10-40 mL Intracatheter Q12H   HYDROmorphone (DILAUDID) injection, lidocaine, menthol-cetylpyridinium, metoprolol tartrate, [DISCONTINUED] ondansetron **OR** ondansetron (ZOFRAN) IV, opium-belladonna, phenazopyridine, phenol, promethazine, sodium chloride flush  Assessment/ Plan:  42 y.o. caucasian female is post op abdominal hysterectomy, left salpingo-oophorectomy, laparoscopic lysis of adhesions  and retroperitoneal dissection on 04/28/2018.  Followed by cystoscopy with retrograde pyelogram and left ureteral stent placement on 04/29/2018 Baseline creatinine is 0.79 from 04/19/2018 Post op case complicated by ARF, b/l  hydronephrosis  1. ARF, likely multifactorial from severe ATN and b/l hydronephrosis. - Left ureteral stent in place - rt nephrostomy placed 6/10 -  UOP ~improved last 24 hrs (1450 nephrostomy), + 1300 in foley - S Creatinine is continuing to  improve.   No acute indication for HD at present Consider right nephrostogram vs Clamp nephrostomy and looking for hydronephrosis- plan as per ICU team/Urology  2. Generalized edema - quite severe - does not appear to affecting respiratory status - will continue to monitor closely - expected continued improvement now that ARF is resolving  3. Hypokalemia - replace Potassium , magnesium as necessary    LOS: 8 Addysyn Fern Thedore Mins 6/15/20198:58 AM  [facility] [place], [place] [phone]  Note: This note was prepared with Dragon dictation. Any transcription errors are unintentional

## 2018-05-05 NOTE — Progress Notes (Signed)
Occupational Therapy Treatment Patient Details Name: [name] MRN: [id number] DOB: 07/13/1976 Today's Date: 05/05/2018    History of present illness Pt is a 42 y/o F s/p laparascopic hysterectomy, Bil aslpingectomy 6/7.  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  Developed hematuria and L hydronephrosis 06/09 requiring cystoscopy and left ureteral stent placement. Renal US 6/10 revealed right hydronephrosis. S/P R percutaneous nephrostomy tube placement 6/10.  NG tube placed 6/10.  Pt with AKI and ileus.  Pt's PMH includes DVT.    OT comments  Patient was supine in bed when OT arrived. States she was attempting to eat, but was fatiguing quickly. Willing to work with OT. Performed BUE strengthening 10 x1 with AAROM and adjusted resistance provided by therapist. Educated on performing BUE exercises several times a day, using light resistance of water bottle or remote control as needed and able. Performed self feeding of pudding with setup, but patient only able to eat a few bites. She states both fatigue and nausea are affecting her performance. Able to reach for cup and drink without assistance. Educated patient and family to attempt to perform as much as possible, but have family or nursing to assist if she fatigues before she is done. Also educated on pacing to help with self-feeding. Patient would benefit from continued OT services to assist with increased strength and activity tolerance for ADL tasks.   Follow Up Recommendations  CIR    Equipment Recommendations       Recommendations for Other Services      Precautions / Restrictions Precautions Precautions: Fall Precaution Comments: ureteral drain, R nephrostomy tube, L IJ CVC line Restrictions Weight Bearing Restrictions: No       Mobility Bed Mobility               General bed mobility comments: deferred this  session  Transfers                      Balance                                           ADL either performed or assessed with clinical judgement   ADL Overall ADL's : Needs assistance/impaired Eating/Feeding: Set up Eating/Feeding Details (indicate cue type and reason): upgraded to soft diet. Patient fatigues quickly while self feeding. Needs frequent rest breaks.                                         Vision Patient Visual Report: No change from baseline     Perception     Praxis      Cognition Arousal/Alertness: Awake/alert Behavior During Therapy: WFL for tasks assessed/performed Overall Cognitive Status: Within Functional Limits for tasks assessed                                 General Comments: Pt fatigues quickly. States she is very tired today.        Exercises General Exercises - Upper Extremity Shoulder Flexion: AAROM;Strengthening;Both;Supine;10 reps Shoulder Extension: AAROM;10 reps;Both;Strengthening Elbow Flexion: Strengthening;AAROM;Both;10 reps;Supine Other Exercises Other Exercises: BLE AAROM for ankle pumps, heel slides, ab/add SLR 2 x  10   Shoulder Instructions       General Comments      Pertinent Vitals/ Pain       Pain Assessment: No/denies pain Pain Score: 5  Pain Location: abdominal  Pain Descriptors / Indicators: Sore;Operative site guarding Pain Intervention(s): Limited activity within patient's tolerance;Monitored during session  Home Living                                          Prior Functioning/Environment              Frequency  Min 3X/week        Progress Toward Goals  OT Goals(current goals can now be found in the care plan section)  Progress towards OT goals: Progressing toward goals  Acute Rehab OT Goals Patient Stated Goal: to return to PLOF as quickly as possible OT Goal Formulation: With patient Time For Goal Achievement:  05/17/18 Potential to Achieve Goals: Good  Plan Discharge plan remains appropriate;Frequency remains appropriate    Co-evaluation                 AM-PAC PT "6 Clicks" Daily Activity     Outcome Measure   Help from another person eating meals?: A Little Help from another person taking care of personal grooming?: A Little Help from another person toileting, which includes using toliet, bedpan, or urinal?: A Lot Help from another person bathing (including washing, rinsing, drying)?: A Lot Help from another person to put on and taking off regular upper body clothing?: A Little Help from another person to put on and taking off regular lower body clothing?: A Lot 6 Click Score: 15    End of Session    OT Visit Diagnosis: Other abnormalities of gait and mobility (R26.89);Muscle weakness (generalized) (M62.81)   Activity Tolerance Patient tolerated treatment well;Patient limited by fatigue   Patient Left in bed;with call bell/phone within reach;with bed alarm set;Other (comment);with family/visitor present   Nurse Communication          Time: [phone] OT Time Calculation (min): 25 min  Charges: OT General Charges $OT Visit: 1 Visit OT Treatments $Self Care/Home Management : 8-22 mins $Therapeutic Exercise: 8-22 mins  Kirstie Peri, OTR/L   [name] L 05/05/2018, 12:59 PM

## 2018-05-05 NOTE — Progress Notes (Signed)
After bed tilt and medication administration, patient had small amount of emesis (roughly 50 mL). Patient reported no nausea prior to incident and stated she felt like she just tried to drink too much too quickly. She had drank roughly 150 mL of liquid in less than 3 minutes just prior to the emesis. Reports no nausea after incident and agreed to slow down fluid intake. Will continue to monitor.

## 2018-05-05 NOTE — Progress Notes (Signed)
Patient continued on room air, still needs prompting to perform incentive spirometry but does satisfactory effort when prompted. Urine output 1750 mL, 825 mL nephrostomy and 925 mL foley. Foley had to be irrigated once due to clots and patient received one dose of B/O suppository for bladder spasms. Only two doses of pain medication given during shift. Patient's NG removed per MD order and diet advanced to full liquid. Patient slowed PO intake after advancement, see I/O flowsheet. Performed bed tilt to stand with VitalGo Total Lift Bed twice during shift. The first time patient was very scared and only 'stood' for 1 minute at 75 degrees. The second time patient 'stood' for 7 minutes at 80 degrees with lots of encouragement from staff and husband. Still passing gas but no bowel movement this shift.

## 2018-05-05 NOTE — Progress Notes (Addendum)
PT PROFILE: 44 F with PMH of GERD, DVT, and DUB, underwent total hysterectomy and bilateral laparoscopic salpingectomy 06/07. 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 Developed hematuria and L hydronephrosis 06/09 requiring cystoscopy and left ureteral stent placement. Renal US 6/10 revealed right hydronephrosis. S/P R percutaneous nephrostomy tube placement 6/10  CTAP 6/12: Ileus pattern, mild left hydronephrosis persists, right hydronephrosis resolved, 5 cm hematoma within the hysterectomy bed (unchanged), moderate anasarca  SUBJ: Patient had a good night last night. She tolerated clear liquids without any evidence of nausea and vomiting area wishes to progress diet and have NG tube removed  Vitals:   05/05/18 0500 05/05/18 0600 05/05/18 0700 05/05/18 0800  BP: (!) 114/92 122/60 (!) 116/59 127/62  Pulse: 90 99 98 95  Resp: 17 19 17  (!) 21  Temp:  97.9 F (36.6 C)  99.8 F (37.7 C)  TempSrc:  Oral  Oral  SpO2: 96% 94% 91% 97%  Weight:      Height:      2 LPM [place]  Gen: No overt distress HEENT: NGT in place, NCAT, sclerae white Neck: JVP not visualized Lungs: Clear anteriorly without wheezes or adventitious sounds Cardiovascular: Mild tachycardia, regular, no murmur Abdomen: distended, firm, BS markedly diminished to absent, no rebound tenderness Ext: Edema in all extremities, NSC Neuro: No focal neurologic deficits   BMP Latest Ref Rng & Units 05/05/2018 05/04/2018 05/03/2018  Glucose 65 - 99 mg/dL 161(W) 960(A) 540(J)  BUN 6 - 20 mg/dL 81(X) 91(Y) 78(G)  Creatinine 0.44 - 1.00 mg/dL 9.56(O) 1.30(Q) 6.57(Q)  Sodium 135 - 145 mmol/L 128(L) 133(L) 129(L)  Potassium 3.5 - 5.1 mmol/L 2.9(L) 3.6 3.9  Chloride 101 - 111 mmol/L 89(L) 92(L) 89(L)  CO2 22 - 32 mmol/L 24 23  21(L)  Calcium 8.9 - 10.3 mg/dL 7.0(L) 7.2(L) 7.1(L)    CBC Latest Ref Rng & Units 05/05/2018 05/04/2018 05/03/2018  WBC 3.6 - 11.0 K/uL 11.9(H) 13.0(H) 14.9(H)  Hemoglobin 12.0 - 16.0 g/dL 7.7(L) 7.7(L) 7.9(L)  Hematocrit 35.0 - 47.0 % 22.2(L) 22.5(L) 23.4(L)  Platelets 150 - 440 K/uL 208 171 135(L)     Results for orders placed or performed during the hospital encounter of 04/27/18  MRSA PCR Screening     Status: None   Collection Time: 04/27/18 10:27 PM  Result Value Ref Range Status   MRSA by PCR NEGATIVE NEGATIVE Final    Comment:        The GeneXpert MRSA Assay (FDA approved for NASAL specimens only), is one component of a comprehensive MRSA colonization surveillance program. It is not intended to diagnose MRSA infection nor to guide or monitor treatment for MRSA infections. Performed at [facility], [address] [place], [place] [address]   CULTURE, BLOOD (ROUTINE X 2) w Reflex to ID Panel     Status: None   Collection Time: 04/28/18  9:24 PM  Result Value Ref Range Status   Specimen Description BLOOD RIGHT HAND  Final   Special Requests   Final    BOTTLES DRAWN AEROBIC AND ANAEROBIC Blood Culture adequate volume   Culture   Final    NO GROWTH 5 DAYS Performed at [facility], [address], [place], [place] [address]    Report Status 05/03/2018 FINAL  Final  CULTURE, BLOOD (ROUTINE X 2) w Reflex to ID Panel     Status: None   Collection Time: 04/28/18  9:44 PM  Result Value Ref Range Status   Specimen Description BLOOD RIGHT ANTECUBITAL  Final   Special Requests   Final    BOTTLES DRAWN AEROBIC AND ANAEROBIC Blood Culture adequate volume   Culture   Final    NO GROWTH 5 DAYS Performed at [facility], [address] [place], [place] [address]    Report Status 05/03/2018 FINAL  Final    Anti-infectives (From admission, onward)   Start     Dose/Rate Route Frequency Ordered Stop   05/02/18 1000  meropenem (MERREM) 500 mg in  sodium chloride 0.9 % 100 mL IVPB     500 mg 200 mL/hr over 30 Minutes Intravenous Every 12 hours 05/02/18 0911     05/01/18 2200  piperacillin-tazobactam (ZOSYN) IVPB 3.375 g  Status:  Discontinued     3.375 g 12.5 mL/hr over 240 Minutes Intravenous Every 12 hours 05/01/18 1952 05/02/18 0838   04/30/18 2200  piperacillin-tazobactam (ZOSYN) IVPB 3.375 g  Status:  Discontinued     3.375 g 12.5 mL/hr over 240 Minutes Intravenous Every 12 hours 04/30/18 0833 05/01/18 1509   04/29/18 0330  piperacillin-tazobactam (ZOSYN) IVPB 3.375 g  Status:  Discontinued     3.375 g 12.5 mL/hr over 240 Minutes Intravenous Every 8 hours 04/29/18 0317 04/30/18 0833   04/27/18 0615  ceFAZolin (ANCEF) 2-4 GM/100ML-% IVPB    Note to Pharmacy:  Lorrene ReidJackson, Pamela   : cabinet override      04/27/18 0615 04/27/18 0746   04/27/18 0600  ceFAZolin (ANCEF) IVPB 2g/100 mL premix     2 g 200 mL/hr over 30 Minutes Intravenous On call to O.R. 04/26/18 2240 04/27/18 1142     CTAP 6/12: Ileus pattern, mild left hydronephrosis persists, right hydronephrosis resolved, 5 cm hematoma within the hysterectomy bed (unchanged), moderate anasarca  CXR: Minimal bibasilar atelectasis, distended bowels and upper abdomen  IMP: S/P hysterectomy 06/07 with intra-abdominal bleeding complications  Severe hematuria - resolving  Left hydronephrosis - Status post left ureteral stent, Right hydronephrosis - Status post percutaneous nephrostomy tube  AKI, oligo-anuric.  Slowly improving creatinine, down to 3.74  hyponatremia  Hypokalemia we'll replace  Acute blood loss anemia, not actively bleeding presently, hemoglobin is 7.7  Ileus improving discuss with patient, will try to progress diet, remove NG tube. She realizes it may have to go back and if she starts developing distention nausea and vomiting    Tora KindredJohn Aldahir Litaker, DO  05/05/2018 8:21 AM Patient ID: Jonelle SidleGinger B Borman, female   DOB: 12/28/1975, 42 y.o.   MRN: [id number] Patient ID:  Jonelle SidleGinger B Roberti, female   DOB: 08/09/1976, 42 y.o.   MRN: [id number]

## 2018-05-05 NOTE — Progress Notes (Addendum)
Patient ID: [name], female   DOB: 11/24/1975, 42 y.o.   MRN: [id number] 7 Days Post-Op  Procedure(s):  1. 04/28/18: ATTEMPTED LAPAROSCOPIC HYSTERECTOMY CONVERTED TO SUPRACERVICAL ABDOMINAL HYSTERECTOMY (N/A) SALPINGO OOPHORECTOMY (Left) CYSTOSCOPY (N/A) UNILATERAL SALPINGECTOMY (Right) LAPAROSCOPIC LYSIS OF ADHESIONS, RETROPERITONEAL DISSECTION  2. 04/29/18: CYSTOSCOPY WITH RETROGRADE PYELOGRAM/Left URETERAL STENT PLACEMENT, ureteroscopy (Bilateral)    Subjective: Pt still in ICU, clamped NG tube (placed 6/10) with decreased abdominal distension, improved and talking. Drinking 1L overnight without nausea. Family at bedside, good support.Tolerating clear fluids, +passing gas  Hospital course:    6/15- NG tube clamped, Xray suggesting continued ileus, but nausea resolved. +flatus, pt interested in eating. Tolerating clear liquid with NG tube clamped. - Creatinine pending this morning - Hgb dropping, no evidence of intraabdominal bleeding.    - Foley causing most pain, 0.5mg  dilaudid PRN    - Working with physical therapy    - s/p SCDs, now heparin and TED hose   6/14- On broad spectrum, lasix and albumin today, with improved urine output over the day. Creatinine still rising.    - Nausea improved with NG tube placement and now resolved.    - Foley causing most pain, 0.5mg  dilaudid PRN    - Working with physical therapy    - s/p SCDs, now heparin and TED hose  6/13 - CT scan , no blood loss in pelvis, unchanged 5cm hematoma, no mention of infection. - continuing tachycardic in sinus rhythm, now in 120s. 98-100% O2 sats on room air.   - Troponin bumped 0.29 04/30/18, now 0.15 05/01/18, no longer checking  - on 5000U of heparin q8 hrs for DVT ppx.  She was started on Zosyn for clinical dx of sepsis with persistent tachycardia, elevated lactic acid 2.5 (4.2) and elevated WBC to 16.1 (down from 17.3 just postop) , 3.375g q8hrs.  WBC 18.3 6/10 WBC 16.1 6/11 WBC 13.0 6/13 WBC 11.9  today  H/H remains stable. Platelets stable, up to >100 (97) (87)      - s/p OR with Dr. Alvester MorinBell on 04/29/18 from urology for cystoscopy and left pyelogram and left ureteral stent placement for investigation of bladder and ureters after continued low urine output and bloody urine with pelvic pain. Previously, pt did have CT scan without contrast because of acute renal insufficiency to evaluate bladder, and no bladder injury noted. 5cm hematoma in pelvis noted on CT scan, which I would expect given the surgery. She has a left A-line in place.    Objective: Vital signs in last 24 hours: Temp:  [97.4 F (36.3 C)-98.2 F (36.8 C)] 97.9 F (36.6 C) (06/15 0600) Pulse Rate:  [88-109] 99 (06/15 0600) Resp:  [14-22] 19 (06/15 0600) BP: (94-137)/(54-100) 122/60 (06/15 0600) SpO2:  [91 %-100 %] 94 % (06/15 0600)  Intake/Output  Intake/Output Summary (Last 24 hours) at 05/05/2018 0654 Last data filed at 05/05/2018 0600 Gross per 24 hour  Intake 2693.33 ml  Output 5125 ml  Net -2431.67 ml    Physical Exam:  General: Oriented and responsive, appears less ill. CV: Regular rhythm, no murmurs, increased rate Lungs: Decreased breath sounds in bilateral bases, L>R GI: Tense, moderately-distended. Ecchymosis over mons deepening, and lap incisions clean and dry. +bowel sounds.   Incisions: Clean and dry, honeycomb dressing in place. Urine: Pink, Foley in place Extremities: Nontender, no erythema, improving edema in lower extremities less tense than yesterday, with a bola on left lateral arm at site of prior IV, covered with a dressing. Fingers,  hands and arms are no longer edematous.  Lab Results: Recent Labs    05/03/18 0158 05/04/18 0447  HGB 7.9* 7.7*  HCT 23.4* 22.5*  WBC 14.9* 13.0*  PLT 135* 171                 No results found for this or any previous visit (from the past 24 hour(s)).  Assessment/Plan: 7 Days Post-Op        Procedure(s): 1. 04/28/18: ATTEMPTED LAPAROSCOPIC  HYSTERECTOMY CONVERTED TO SUPRACERVICAL ABDOMINAL HYSTERECTOMY (N/A) SALPINGO OOPHORECTOMY (Left) CYSTOSCOPY (N/A) UNILATERAL SALPINGECTOMY (Right) LAPAROSCOPIC LYSIS OF ADHESIONS, RETROPERITONEAL DISSECTION   2. 04/29/18: CYSTOSCOPY WITH RETROGRADE PYELOGRAM/Left URETERAL STENT PLACEMENT, ureteroscopy (Bilateral)   Ileus: abdominal distension, sips and chips, now with NG tube clamped, possible removal today.  Acute renal insufficiency - managed by nephrology, lasix and albumin, s/p perc neph on right and has left stent in place. Nephrology hopeful. Hyperkalemia - resolved Leukocytosis - expected. S/p zosyn. Her procalcitonin raised slightly and WBC rise slightly, now on broader spectrum. No fever Thrombocytopenia - resolved Anemia- Hgb 7.7, monitor and replace per protocol Hematuria - foley in place. Appreciate urology and nephrology recs.  Pain: s/p PCA. Will restart if needed, pain meds for ambulation with PT when able  Tachycardia - resolved. O2 sats 98-100 on room air.  DVT ppx: Heparin. TEDs recommended Third-spacing fluid in extremities - improved clinically, improving UOP.  Working with PT. Pain meds as needed to encourage out of bed.  Appreciate urology and nephrology assistance.  Appreciate intensivist involvement for monitoring and treatment.   Christeen Douglas, MD   LOS: 8 days   Christeen Douglas 05/05/2018, 6:54 AM

## 2018-05-05 NOTE — Progress Notes (Signed)
Pt still has not complained of any nausea.  Did complain of pain in the bladder.  Stated that she feels like it might be the foley.  Foley had 1150 total out, mostly amber urine.  But, around 4AM, foley output turned more red in color.  Pt complained of increased pain in the bladder.  Foley was irrigated 3 times.  Two very large clots were evacuated.  Urine became free flowing again and more yellow in color.  Pt did drink a lot of fluid during the night, estimated at a liter.

## 2018-05-06 ENCOUNTER — Inpatient Hospital Stay: Payer: BLUE CROSS/BLUE SHIELD

## 2018-05-06 DIAGNOSIS — N3289 Other specified disorders of bladder: Secondary | ICD-10-CM

## 2018-05-06 DIAGNOSIS — N1330 Unspecified hydronephrosis: Secondary | ICD-10-CM

## 2018-05-06 LAB — BASIC METABOLIC PANEL
Anion gap: 16 — ABNORMAL HIGH (ref 5–15)
BUN: 76 mg/dL — AB (ref 6–20)
CO2: 24 mmol/L (ref 22–32)
CREATININE: 2.01 mg/dL — AB (ref 0.44–1.00)
Calcium: 7.1 mg/dL — ABNORMAL LOW (ref 8.9–10.3)
Chloride: 90 mmol/L — ABNORMAL LOW (ref 101–111)
GFR calc Af Amer: 34 mL/min — ABNORMAL LOW (ref >60)
GFR, EST NON AFRICAN AMERICAN: 29 mL/min — AB (ref >60)
Glucose, Bld: 127 mg/dL — ABNORMAL HIGH (ref 65–99)
Potassium: 3.2 mmol/L — ABNORMAL LOW (ref 3.5–5.1)
SODIUM: 130 mmol/L — AB (ref 135–145)

## 2018-05-06 LAB — CBC
HCT: 21.5 % — ABNORMAL LOW (ref 35.0–47.0)
Hemoglobin: 7.6 g/dL — ABNORMAL LOW (ref 12.0–16.0)
MCH: 31.8 pg (ref 26.0–34.0)
MCHC: 35.4 g/dL (ref 32.0–36.0)
MCV: 89.7 fL (ref 80.0–100.0)
PLATELETS: 253 10*3/uL (ref 150–440)
RBC: 2.39 MIL/uL — ABNORMAL LOW (ref 3.80–5.20)
RDW: 14.3 % (ref 11.5–14.5)
WBC: 14.2 10*3/uL — AB (ref 3.6–11.0)

## 2018-05-06 MED ORDER — HYDROMORPHONE HCL 1 MG/ML IJ SOLN
0.5000 mg | Freq: Once | INTRAMUSCULAR | Status: AC
  Administered 2018-05-06: 0.5 mg via INTRAVENOUS

## 2018-05-06 MED ORDER — POTASSIUM CHLORIDE 20 MEQ PO PACK
60.0000 meq | PACK | Freq: Once | ORAL | Status: AC
  Administered 2018-05-06: 60 meq via ORAL
  Filled 2018-05-06: qty 3

## 2018-05-06 MED ORDER — SODIUM CHLORIDE 0.9 % IV SOLN
1.0000 g | Freq: Two times a day (BID) | INTRAVENOUS | Status: DC
  Administered 2018-05-06: 1 g via INTRAVENOUS
  Filled 2018-05-06 (×3): qty 1

## 2018-05-06 MED ORDER — SIMETHICONE 40 MG/0.6ML PO SUSP
40.0000 mg | Freq: Once | ORAL | Status: DC
  Filled 2018-05-06: qty 0.6

## 2018-05-06 MED ORDER — POTASSIUM CHLORIDE 10 MEQ/50ML IV SOLN
10.0000 meq | INTRAVENOUS | Status: AC
Start: 2018-05-06 — End: 2018-05-06
  Administered 2018-05-06 (×3): 10 meq via INTRAVENOUS
  Filled 2018-05-06 (×3): qty 50

## 2018-05-06 NOTE — Progress Notes (Addendum)
During bedside rounding at shift change pt complained of pain to her R flank area. She described it as sharp and continuous. Dr. Lonn Georgiaonforti rounding on pt at the same time. He gave verbal order to administer one-time-additional dose of 0.5mg  dilaudid IV now. In addition, he asked to have urology called so that he could speak to urologist regarding imaging of R flank area. Urologist paged. Awaiting call back. 0.5mg  of IV dilaudid administered, and pt repositioned for comfort. R nephrostomy tube is draining, and dressing around tube remains clean, dry, and intact. Foley catheter has maroon-colored urine draining without visible clots seen at this time. Discussed SQ heparin with Dr. Lonn Georgiaonforti and he stated to hold today's doses.

## 2018-05-06 NOTE — Progress Notes (Addendum)
Pt placed in chair position of [employer] Lift Bed after bath. Pt currently resting in chair position with husband at bedside.   Update 1340: Pt stated she wanted to lay back down in the bed position. Pt was able to stay in the chair position for 40 minutes before becoming fatigued. PT made aware.

## 2018-05-06 NOTE — Progress Notes (Signed)
8 Days Post-Op Subjective: Called to see patient this morning due to right flank pain, leaking around the right nephrostomy tube.  Also gross hematuria.  Nurse flushed the fluid clots out last night.  Patient reports right flank pain is improved.  She underwent a KUB  which shows the left stent in the right nephrostomy tube are in good position.  I reviewed the image and compared back to the CT images.  She was rescanned the other day.  Objective: Vital signs in last 24 hours: Temp:  [97.5 F (36.4 C)-98.7 F (37.1 C)] 97.5 F (36.4 C) (06/16 0811) Pulse Rate:  [92-107] 100 (06/16 0900) Resp:  [15-24] 15 (06/16 0900) BP: (112-137)/(48-70) 115/65 (06/16 0900) SpO2:  [92 %-100 %] 98 % (06/16 0900)  Intake/Output from previous day: 06/15 0701 - 06/16 0700 In: 2740 [P.O.:2390; IV Piggyback:350] Out: 2830 [Urine:2800; Emesis/NG output:30] Intake/Output this shift: Total I/O In: 50 [IV Piggyback:50] Out: 325 [Urine:325]  Physical Exam:  No acute distress, alert and oriented Right nephrostomy tube- dark urine.  I irrigated the nephrostomy tube with 10 cc saline flush syringe x2.  It irrigates normally and a very small amount of debris and clot were drained.  It appears to be draining well. Foley catheter-red drainage.  With nurses help and chaperone I irrigated the catheter with a [place] syringe.  It immediately irrigated to pink then clear in only 260 cc syringe flushes.  Lab Results: Recent Labs    05/04/18 0447 05/05/18 0637 05/06/18 0422  HGB 7.7* 7.7* 7.6*  HCT 22.5* 22.2* 21.5*   BMET Recent Labs    05/05/18 1226 05/05/18 1939 05/06/18 0422  NA 128*  --  130*  K 3.1* 3.0* 3.2*  CL 88*  --  90*  CO2 24  --  24  GLUCOSE 135*  --  127*  BUN 86*  --  76*  CREATININE 3.23*  --  2.01*  CALCIUM 7.0*  --  7.1*   No results for input(s): LABPT, INR in the last 72 hours. No results for input(s): LABURIN in the last 72 hours. Results for orders placed or performed during  the hospital encounter of 04/27/18  MRSA PCR Screening     Status: None   Collection Time: 04/27/18 10:27 PM  Result Value Ref Range Status   MRSA by PCR NEGATIVE NEGATIVE Final    Comment:        The GeneXpert MRSA Assay (FDA approved for NASAL specimens only), is one component of a comprehensive MRSA colonization surveillance program. It is not intended to diagnose MRSA infection nor to guide or monitor treatment for MRSA infections. Performed at [facility], [address] [place], [place] [address]   CULTURE, BLOOD (ROUTINE X 2) w Reflex to ID Panel     Status: None   Collection Time: 04/28/18  9:24 PM  Result Value Ref Range Status   Specimen Description BLOOD RIGHT HAND  Final   Special Requests   Final    BOTTLES DRAWN AEROBIC AND ANAEROBIC Blood Culture adequate volume   Culture   Final    NO GROWTH 5 DAYS Performed at [facility], [address] [place], [place] [address]    Report Status 05/03/2018 FINAL  Final  CULTURE, BLOOD (ROUTINE X 2) w Reflex to ID Panel     Status: None   Collection Time: 04/28/18  9:44 PM  Result Value Ref Range Status   Specimen Description BLOOD RIGHT ANTECUBITAL  Final   Special  Requests   Final    BOTTLES DRAWN AEROBIC AND ANAEROBIC Blood Culture adequate volume   Culture   Final    NO GROWTH 5 DAYS Performed at [facility], [address] [place], [place] [address]    Report Status 05/03/2018 FINAL  Final    Studies/Results: Dg Abd 1 View  Result Date: 05/06/2018 CLINICAL DATA:  Right nephrostomy. EXAM: ABDOMEN - 1 VIEW COMPARISON:  05/04/2018 FINDINGS: Right nephrostomy catheter and left ureteral stent remain in place, unchanged. Diffuse gaseous distention of bowel, likely ileus, unchanged. Interval removal of NG tube. No free air or organomegaly. IMPRESSION: Diffuse gaseous distention of bowel most compatible with ileus, unchanged. Electronically Signed   By: Charlett Nose M.D.   On: 05/06/2018  08:56   US Renal  Result Date: 05/06/2018 CLINICAL DATA:  Right nephrostomy, pain. EXAM: RENAL / URINARY TRACT ULTRASOUND COMPLETE COMPARISON:  CT 05/02/2018 FINDINGS: Right Kidney: Length: 12.7 cm. Normal echotexture. No mass. Mild right hydronephrosis, new since prior CT. Left Kidney: Length: 11.3 cm. Normal echotexture. No mass. Mild left hydronephrosis. This is similar to prior CT. Bladder: Not visualized.  Foley catheter in place. IMPRESSION: Mild bilateral hydronephrosis. Electronically Signed   By: Charlett Nose M.D.   On: 05/06/2018 09:07    Assessment/Plan:  Bladder erythema, bilateral hydronephrosis, pelvic hematoma-status post right nephrostomy, left ureteral stent and Foley catheter.  Right nephrostomy appears to be in place and operating normally.  It may have simply  clogged up.  Her bladder irrigated to clear quickly and I do not think she needs to be on CBI.  I showed nurse how to flush the right nephrostomy tube with a few cc of sterile saline which can be done as needed.  Also Foley catheter can be flushed with 40 to 60 cc of saline as needed.  Will follow.   LOS: 9 days   Jerilee Field 05/06/2018, 10:50 AM

## 2018-05-06 NOTE — Progress Notes (Signed)
Pt remains alert and oriented.  SR on the monitor. Lungs are clear and pt remains on room air.  Foley is in place.  As the night went on, the urine changed from an amber color to a more red color.  The foley was flushed three times.  The first two times, very large clots were removed.  The last time, no clots were seen.  Pt was woken from sleep because the nephrostomy tube was leaking from around the site. The dressing was saturated and was changed.  Maggie, NP made aware.  Pt began to complain of pain specifically in that area.  Dilaudid prn was given as ordered and a one time dose of dilaudid was given in addition.  Pt reported decrease in pain and was able to sleep. Maggie, NP notified urology.

## 2018-05-06 NOTE — Progress Notes (Signed)
PT PROFILE: 30 F with PMH of GERD, DVT, and DUB, underwent total hysterectomy and bilateral laparoscopic salpingectomy 06/07. 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 Developed hematuria and L hydronephrosis 06/09 requiring cystoscopy and left ureteral stent placement. Renal US 6/10 revealed right hydronephrosis. S/P R percutaneous nephrostomy tube placement 6/10  CTAP 6/12: Ileus pattern, mild left hydronephrosis persists, right hydronephrosis resolved, 5 cm hematoma within the hysterectomy bed (unchanged), moderate anasarca  SUBJ: Patient developed right nephrostomy site pain area has had decreasing output. Urology has been called, will order stat renal ultrasound along with flat plate film. She did tolerate her diet well without any nausea or vomiting, positive bowel movement  Vitals:   05/06/18 0300 05/06/18 0400 05/06/18 0500 05/06/18 0600  BP: 123/70 115/62 (!) 116/55 112/67  Pulse: (!) 106 95 98 93  Resp: 15 18 16 17   Temp:  98.1 F (36.7 C)    TempSrc:  Oral    SpO2: 97% 94% 92% 99%  Weight:      Height:      2 LPM   Gen: No overt distress HEENT: NGT in place, NCAT, sclerae white Neck: JVP not visualized Lungs: Clear anteriorly without wheezes or adventitious sounds Cardiovascular: Mild tachycardia, regular, no murmur Abdomen: distended, firm, BS markedly diminished to absent, no rebound tenderness Ext: Edema in all extremities, NSC Neuro: No focal neurologic deficits   BMP Latest Ref Rng & Units 05/06/2018 05/05/2018 05/05/2018  Glucose 65 - 99 mg/dL 161(W) - 960(A)  BUN 6 - 20 mg/dL 54(U) - 98(J)  Creatinine 0.44 - 1.00 mg/dL 1.91(Y) - 7.82(N)  Sodium 135 - 145 mmol/L 130(L) - 128(L)  Potassium 3.5 - 5.1 mmol/L 3.2(L) 3.0(L) 3.1(L)  Chloride 101 - 111  mmol/L 90(L) - 88(L)  CO2 22 - 32 mmol/L 24 - 24  Calcium 8.9 - 10.3 mg/dL 7.1(L) - 7.0(L)    CBC Latest Ref Rng & Units 05/06/2018 05/05/2018 05/04/2018  WBC 3.6 - 11.0 K/uL 14.2(H) 11.9(H) 13.0(H)  Hemoglobin 12.0 - 16.0 g/dL 7.6(L) 7.7(L) 7.7(L)  Hematocrit 35.0 - 47.0 % 21.5(L) 22.2(L) 22.5(L)  Platelets 150 - 440 K/uL 253 208 171     Results for orders placed or performed during the hospital encounter of 04/27/18  MRSA PCR Screening     Status: None   Collection Time: 04/27/18 10:27 PM  Result Value Ref Range Status   MRSA by PCR NEGATIVE NEGATIVE Final    Comment:        The GeneXpert MRSA Assay (FDA approved for NASAL specimens only), is one component of a comprehensive MRSA colonization surveillance program. It is not intended to diagnose MRSA infection nor to guide or monitor treatment for MRSA infections. Performed at [facility], [address] [place], [place] [address]   CULTURE, BLOOD (ROUTINE X 2) w Reflex to ID Panel     Status: None   Collection Time: 04/28/18  9:24 PM  Result Value Ref Range Status   Specimen Description BLOOD RIGHT HAND  Final   Special Requests   Final    BOTTLES DRAWN AEROBIC AND ANAEROBIC Blood Culture adequate volume   Culture   Final    NO GROWTH 5 DAYS Performed at [facility], [address] [place], [place] [address]  Report Status 05/03/2018 FINAL  Final  CULTURE, BLOOD (ROUTINE X 2) w Reflex to ID Panel     Status: None   Collection Time: 04/28/18  9:44 PM  Result Value Ref Range Status   Specimen Description BLOOD RIGHT ANTECUBITAL  Final   Special Requests   Final    BOTTLES DRAWN AEROBIC AND ANAEROBIC Blood Culture adequate volume   Culture   Final    NO GROWTH 5 DAYS Performed at [facility], [address] [place], [place] [address]    Report Status 05/03/2018 FINAL  Final    Anti-infectives (From admission, onward)   Start     Dose/Rate Route Frequency Ordered Stop   05/02/18  1000  meropenem (MERREM) 500 mg in sodium chloride 0.9 % 100 mL IVPB     500 mg 200 mL/hr over 30 Minutes Intravenous Every 12 hours 05/02/18 0911     05/01/18 2200  piperacillin-tazobactam (ZOSYN) IVPB 3.375 g  Status:  Discontinued     3.375 g 12.5 mL/hr over 240 Minutes Intravenous Every 12 hours 05/01/18 1952 05/02/18 0838   04/30/18 2200  piperacillin-tazobactam (ZOSYN) IVPB 3.375 g  Status:  Discontinued     3.375 g 12.5 mL/hr over 240 Minutes Intravenous Every 12 hours 04/30/18 0833 05/01/18 1509   04/29/18 0330  piperacillin-tazobactam (ZOSYN) IVPB 3.375 g  Status:  Discontinued     3.375 g 12.5 mL/hr over 240 Minutes Intravenous Every 8 hours 04/29/18 0317 04/30/18 0833   04/27/18 0615  ceFAZolin (ANCEF) 2-4 GM/100ML-% IVPB    Note to Pharmacy:  Lorrene ReidJackson, Pamela   : cabinet override      04/27/18 0615 04/27/18 0746   04/27/18 0600  ceFAZolin (ANCEF) IVPB 2g/100 mL premix     2 g 200 mL/hr over 30 Minutes Intravenous On call to O.R. 04/26/18 2240 04/27/18 1142     CTAP 6/12: Ileus pattern, mild left hydronephrosis persists, right hydronephrosis resolved, 5 cm hematoma within the hysterectomy bed (unchanged), moderate anasarca  CXR: Minimal bibasilar atelectasis, distended bowels and upper abdomen  IMP: S/P hysterectomy 06/07 with intra-abdominal bleeding complications  Severe hematuria - patient with continued bleeding, irrigating Foley, pending urology  Left hydronephrosis - Status post left ureteral stent, Right hydronephrosis - Status post percutaneous nephrostomy tube site pain with diminished output. Will obtain renal ultrasound and flat plate film, appreciate urology  AKI, oligo-anuric.  resolving  hyponatremia  Hypokalemia we'll replace  Acute blood loss anemia, hemoglobin has been stable 7.6 this morning  Ileus improving, NG tube was removed, tolerating diet  Tora KindredJohn Shaterrica Territo, DO  05/06/2018 7:58 AM Patient ID: Jonelle SidleGinger B Gamarra, female   DOB: 01/10/1976, 42 y.o.    MRN: [id number] Patient ID: Jonelle SidleGinger B Benzel, female   DOB: 12/14/1975, 42 y.o.   MRN: [id number] Patient ID: Jonelle SidleGinger B Brazzel, female   DOB: 01/27/1976, [age] y.o.   MRN: [id number]

## 2018-05-06 NOTE — Progress Notes (Signed)
[facility], [place] 05/06/18  Subjective:   Patient underwent abdominal hysterectomy, left salpingo-oophorectomy, laparoscopic lysis of adhesions and retroperitoneal dissection on 04/28/2018.  Followed by cystoscopy with retrograde pyelogram and left ureteral stent placement on 04/29/2018 Baseline creatinine is 0.79 from 04/19/2018 Patient has developed postoperative acute renal failure, left hydronephrosis for which left ureteral stent was placed on June 9; Rt hydronephrosis noted on renal U/S; Rt nephrostomy tube placed 9/10  Taking sips of clears. NG tube is out UOP and BUN/Cr have improved significantly K is low Patient reports pain and discomfort on the nephrostomy site.   Objective:  Vital signs in last 24 hours:  Temp:  [97.5 F (36.4 C)-98.7 F (37.1 C)] 97.5 F (36.4 C) (06/16 0811) Pulse Rate:  [88-107] 89 (06/16 1100) Resp:  [15-24] 17 (06/16 1100) BP: (112-133)/(48-70) 118/63 (06/16 1100) SpO2:  [92 %-100 %] 97 % (06/16 1100)  Weight change:  Filed Weights   05/03/18 0800  Weight: 93 kg (205 lb)    Intake/Output:    Intake/Output Summary (Last 24 hours) at 05/06/2018 1209 Last data filed at 05/06/2018 1133 Gross per 24 hour  Intake 1960 ml  Output 2450 ml  Net -490 ml     Physical Exam: General: Ill appearing, laying in bed  HEENT Moist oral mucus membranes  Neck supple  Pulm/lungs Decreased breath sounds at bases  CVS/Heart Tachycardic, regular  Abdomen:  Distended, sluggish bowel sounds  Extremities: ++ tight edema  Neurologic: Alert, oriented  Skin: No acute rashes   Foley in place, rt nephrostomy       Basic Metabolic Panel:  Recent Labs  Lab 04/29/18 2226 04/30/18 0628  05/03/18 1611 05/04/18 0447 05/05/18 0637 05/05/18 1226 05/05/18 1939 05/06/18 0422  NA 133* 132*   < > 129* 133* 128* 128*  --  130*  K 4.2 4.2   < > 3.9 3.6 2.9* 3.1* 3.0* 3.2*  CL 96* 96*   < > 89* 92* 89* 88*  --  90*  CO2 24 23   < >  21* 23 24 24   --  24  GLUCOSE 165* 167*   < > 116* 115* 136* 135*  --  127*  BUN 36* 44*   < > 94* 90* 91* 86*  --  76*  CREATININE 2.90* 3.36*   < > 5.39* 4.99* 3.74* 3.23*  --  2.01*  CALCIUM 6.5* 6.5*   < > 7.1* 7.2* 7.0* 7.0*  --  7.1*  MG 1.4* 2.7*  --   --   --  2.7*  --   --   --   PHOS 4.2  --   --   --   --  5.7*  --   --   --    < > = values in this interval not displayed.     CBC: Recent Labs  Lab 05/02/18 0402 05/03/18 0158 05/04/18 0447 05/05/18 0637 05/06/18 0422  WBC 18.6* 14.9* 13.0* 11.9* 14.2*  HGB 9.2* 7.9* 7.7* 7.7* 7.6*  HCT 26.9* 23.4* 22.5* 22.2* 21.5*  MCV 90.1 89.4 89.2 89.9 89.7  PLT 132* 135* 171 208 253     No results found for: HEPBSAG, HEPBSAB, HEPBIGM    Microbiology:  Recent Results (from the past 240 hour(s))  MRSA PCR Screening     Status: None   Collection Time: 04/27/18 10:27 PM  Result Value Ref Range Status   MRSA by PCR NEGATIVE NEGATIVE Final    Comment:  The GeneXpert MRSA Assay (FDA approved for NASAL specimens only), is one component of a comprehensive MRSA colonization surveillance program. It is not intended to diagnose MRSA infection nor to guide or monitor treatment for MRSA infections. Performed at [facility], [address] [place], [place] [address]   CULTURE, BLOOD (ROUTINE X 2) w Reflex to ID Panel     Status: None   Collection Time: 04/28/18  9:24 PM  Result Value Ref Range Status   Specimen Description BLOOD RIGHT HAND  Final   Special Requests   Final    BOTTLES DRAWN AEROBIC AND ANAEROBIC Blood Culture adequate volume   Culture   Final    NO GROWTH 5 DAYS Performed at [facility], [address] [place], [place] [address]    Report Status 05/03/2018 FINAL  Final  CULTURE, BLOOD (ROUTINE X 2) w Reflex to ID Panel     Status: None   Collection Time: 04/28/18  9:44 PM  Result Value Ref Range Status   Specimen Description BLOOD RIGHT ANTECUBITAL  Final   Special Requests    Final    BOTTLES DRAWN AEROBIC AND ANAEROBIC Blood Culture adequate volume   Culture   Final    NO GROWTH 5 DAYS Performed at [facility], [address], [place], [place] [address]    Report Status 05/03/2018 FINAL  Final    Coagulation Studies: No results for input(s): LABPROT, INR in the last 72 hours.  Urinalysis: No results for input(s): COLORURINE, LABSPEC, PHURINE, GLUCOSEU, HGBUR, BILIRUBINUR, KETONESUR, PROTEINUR, UROBILINOGEN, NITRITE, LEUKOCYTESUR in the last 72 hours.  Invalid input(s): APPERANCEUR    Imaging: Dg Abd 1 View  Result Date: 05/06/2018 CLINICAL DATA:  Right nephrostomy. EXAM: ABDOMEN - 1 VIEW COMPARISON:  05/04/2018 FINDINGS: Right nephrostomy catheter and left ureteral stent remain in place, unchanged. Diffuse gaseous distention of bowel, likely ileus, unchanged. Interval removal of NG tube. No free air or organomegaly. IMPRESSION: Diffuse gaseous distention of bowel most compatible with ileus, unchanged. Electronically Signed   By: Charlett Nose M.D.   On: 05/06/2018 08:56   US Renal  Result Date: 05/06/2018 CLINICAL DATA:  Right nephrostomy, pain. EXAM: RENAL / URINARY TRACT ULTRASOUND COMPLETE COMPARISON:  CT 05/02/2018 FINDINGS: Right Kidney: Length: 12.7 cm. Normal echotexture. No mass. Mild right hydronephrosis, new since prior CT. Left Kidney: Length: 11.3 cm. Normal echotexture. No mass. Mild left hydronephrosis. This is similar to prior CT. Bladder: Not visualized.  Foley catheter in place. IMPRESSION: Mild bilateral hydronephrosis. Electronically Signed   By: Charlett Nose M.D.   On: 05/06/2018 09:07     Medications:   . meropenem (MERREM) IV Stopped (05/06/18 9147)   . docusate  100 mg Oral BID  . heparin injection (subcutaneous)  5,000 Units Subcutaneous Q8H  . mouth rinse  15 mL Mouth Rinse BID  . metoCLOPramide (REGLAN) injection  5 mg Intravenous Q8H  . nystatin  5 mL Oral QID  . pantoprazole (PROTONIX) IV  40 mg Intravenous  Q24H  . polyethylene glycol  17 g Oral Daily  . simethicone  40 mg Oral Once  . sodium chloride flush  10-40 mL Intracatheter Q12H   HYDROmorphone (DILAUDID) injection, lidocaine, menthol-cetylpyridinium, metoprolol tartrate, [DISCONTINUED] ondansetron **OR** ondansetron (ZOFRAN) IV, opium-belladonna, phenazopyridine, phenol, promethazine, sodium chloride flush  Assessment/ Plan:  42 y.o. caucasian female is post op abdominal hysterectomy, left salpingo-oophorectomy, laparoscopic lysis of adhesions and retroperitoneal dissection on 04/28/2018.  Followed by cystoscopy with retrograde pyelogram and left ureteral  stent placement on 04/29/2018 Baseline creatinine is 0.79 from 04/19/2018 Post op case complicated by ARF, b/l  hydronephrosis  1. ARF, likely multifactorial from severe ATN and b/l hydronephrosis likely from compression of ureters from hematoma, post surgical edema. No ureteral injury - Left ureteral stent in place - rt nephrostomy placed 6/10 -  UOP ~improved last 24 hrs (1175 nephrostomy), + 1625 in foley - S Creatinine is continuing to improve.  Continues to have some gross hematuria with clots in the Foley catheter  No acute indication for HD at present Nephrostomy management as per urologist  2. Generalized edema - quite severe - does not appear to affecting respiratory status - will continue to monitor closely - expected continued improvement now that ARF is resolving  3. Hypokalemia - replace Potassium , magnesium as necessary    LOS: 9 Julious Langlois 6/16/201912:09 PM  [facility] [place], [place] [phone]  Note: This note was prepared with Dragon dictation. Any transcription errors are unintentional

## 2018-05-06 NOTE — Progress Notes (Signed)
Physical Therapy Treatment Patient Details Name: [name] MRN: [id number] DOB: 10/26/1976 Today's Date: 05/06/2018    History of Present Illness Pt is a 42 y/o F s/p laparascopic hysterectomy, Bil aslpingectomy 6/7.  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  Developed hematuria and L hydronephrosis 06/09 requiring cystoscopy and left ureteral stent placement. Renal US 6/10 revealed right hydronephrosis. S/P R percutaneous nephrostomy tube placement 6/10.  NG tube placed 6/10.  Pt with AKI and ileus.  Pt's PMH includes DVT.     PT Comments    Pt presents with deficits in strength, transfers, mobility, gait, balance, and activity tolerance.  Nursing reported pt was in chair position in tilt bed for around 40 min until fatigued and return to supine.  Upon entering room pt initially refused pt services secondary to fatigue but with gentle encouragement agreed to supine therex.  Pt tolerated the below therex well with SpO2 and HR WNL throughout but pt was fatigued and was given occasional short therapeutic rest breaks.  Pt will benefit from PT services in an inpatient rehab setting upon discharge to safely address above deficits for decreased caregiver assistance and eventual return to PLOF.       Follow Up Recommendations  CIR     Equipment Recommendations  Rolling walker with 5" wheels    Recommendations for Other Services Rehab consult     Precautions / Restrictions Precautions Precautions: Fall Precaution Comments: ureteral drain, R nephrostomy tube, L IJ CVC line Restrictions Weight Bearing Restrictions: No    Mobility  Bed Mobility               General bed mobility comments: deferred this session per pt request  Transfers                    Ambulation/Gait                 Stairs             Wheelchair Mobility    Modified Rankin (Stroke  Patients Only)       Balance                                            Cognition Arousal/Alertness: Awake/alert Behavior During Therapy: WFL for tasks assessed/performed Overall Cognitive Status: Within Functional Limits for tasks assessed                                 General Comments: Pt fatigues quickly. States she is very tired today.      Exercises Total Joint Exercises Ankle Circles/Pumps: Strengthening;Both;5 reps;10 reps(Manual resistance) Quad Sets: Strengthening;Both;10 reps;15 reps Gluteal Sets: Strengthening;Both;10 reps;15 reps Short Arc Quad: Strengthening;Both;5 reps;10 reps(Manual resistance) Heel Slides: AAROM;Both;10 reps;5 reps Hip ABduction/ADduction: AAROM;Both;5 reps;10 reps Straight Leg Raises: AAROM;Both;5 reps;10 reps Other Exercises Other Exercises: HEP education and review for BLE APs, QS, and GS x 10 each 5-6x/day Other Exercises: Supine leg press with manual resistance x 10 to BLEs    General Comments        Pertinent Vitals/Pain Pain Assessment: No/denies pain    Home Living                      Prior  Function            PT Goals (current goals can now be found in the care plan section)      Frequency    7X/week      PT Plan Current plan remains appropriate    Co-evaluation              AM-PAC PT "6 Clicks" Daily Activity  Outcome Measure                   End of Session   Activity Tolerance: Patient limited by fatigue Patient left: in bed;with call bell/phone within reach Nurse Communication: Mobility status PT Visit Diagnosis: Muscle weakness (generalized) (M62.81);Difficulty in walking, not elsewhere classified (R26.2);Other abnormalities of gait and mobility (R26.89)     Time: 1355-1419 PT Time Calculation (min) (ACUTE ONLY): 24 min  Charges:  $Therapeutic Exercise: 23-37 mins                    G Codes:       DElly Modena PT, DPT 05/06/18, 2:34  PM

## 2018-05-06 NOTE — Progress Notes (Signed)
PT PROFILE: [age] F with PMH of GERD, DVT, and DUB, underwent total hysterectomy and bilateral laparoscopic salpingectomy 06/07. 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 Developed hematuria and L hydronephrosis 06/09 requiring cystoscopy and left ureteral stent placement. Renal US 6/10 revealed right hydronephrosis. S/P R percutaneous nephrostomy tube placement 6/10  CTAP 6/12: Ileus pattern, mild left hydronephrosis persists, right hydronephrosis resolved, 5 cm hematoma within the hysterectomy bed (unchanged), moderate anasarca  SUBJECTIVE: This morning patient had sudden onset right flank pain and worsening hematuria.  Her Foley was irrigated and multiple clots were evacuated.  Patient describes pain as sharp 9 on 10 in intensity nonradicular with no associated symptoms.  Her oral intake has not been poor overnight compared to the previous night.  Also noted was from urine leak around the nephrostomy tube.  The dressing was changed.  Patient was also medicated for pain with an extra dose of Dilaudid.  Urology notified and recommended Foley be flushed as needed and patient will be seen later on by the rounding your urologist.  Her hemoglobin is stable this morning at 7.6 down from 7.7.  She had a bowel movement last night  OBJECTIVE  Vitals:   05/06/18 0300 05/06/18 0400 05/06/18 0500 05/06/18 0600  BP: 123/70 115/62 (!) 116/55 112/67  Pulse: (!) 106 95 98 93  Resp: 15 18 16 17   Temp:  98.1 F (36.7 C)    TempSrc:  Oral    SpO2: 97% 94% 92% 99%  Weight:      Height:      2 LPM [place]  Gen: Moderate distress HEENT: PERRLA, conjunctivae pale, sclerae white Neck: JVP not visualized Lungs: Clear anteriorly without wheezes or adventitious sounds Cardiovascular: Mild tachycardia,  regular, no murmur Abdomen: Distended, positive bowel sounds in all 4 quadrants, palpation reveals mild tenderness along the incision line Ext: Edema in all extremities, NSC Neuro: No focal neurologic deficits   BMP Latest Ref Rng & Units 05/06/2018 05/05/2018 05/05/2018  Glucose 65 - 99 mg/dL 191(Y127(H) - 782(N135(H)  BUN 6 - 20 mg/dL 56(O76(H) - 13(Y86(H)  Creatinine 0.44 - 1.00 mg/dL 8.65(H2.01(H) - 8.46(N3.23(H)  Sodium 135 - 145 mmol/L 130(L) - 128(L)  Potassium 3.5 - 5.1 mmol/L 3.2(L) 3.0(L) 3.1(L)  Chloride 101 - 111 mmol/L 90(L) - 88(L)  CO2 22 - 32 mmol/L 24 - 24  Calcium 8.9 - 10.3 mg/dL 7.1(L) - 7.0(L)    CBC Latest Ref Rng & Units 05/06/2018 05/05/2018 05/04/2018  WBC 3.6 - 11.0 K/uL 14.2(H) 11.9(H) 13.0(H)  Hemoglobin 12.0 - 16.0 g/dL 7.6(L) 7.7(L) 7.7(L)  Hematocrit 35.0 - 47.0 % 21.5(L) 22.2(L) 22.5(L)  Platelets 150 - 440 K/uL 253 208 171     Results for orders placed or performed during the hospital encounter of 04/27/18  MRSA PCR Screening     Status: None   Collection Time: 04/27/18 10:27 PM  Result Value Ref Range Status   MRSA by PCR NEGATIVE NEGATIVE Final    Comment:        The GeneXpert MRSA Assay (FDA approved for NASAL specimens only), is one component of a comprehensive MRSA colonization surveillance program. It is not intended to diagnose MRSA infection nor to guide or monitor treatment for MRSA infections. Performed at [facility], [address] [place],  [place] [address]   CULTURE, BLOOD (ROUTINE X 2) w Reflex to ID Panel     Status: None   Collection Time: 04/28/18  9:24 PM  Result Value Ref Range Status   Specimen Description BLOOD RIGHT HAND  Final   Special Requests   Final    BOTTLES DRAWN AEROBIC AND ANAEROBIC Blood Culture adequate volume   Culture   Final    NO GROWTH 5 DAYS Performed at [facility], [address] [place], [place] [address]    Report Status 05/03/2018 FINAL  Final  CULTURE, BLOOD (ROUTINE X 2) w Reflex to ID Panel      Status: None   Collection Time: 04/28/18  9:44 PM  Result Value Ref Range Status   Specimen Description BLOOD RIGHT ANTECUBITAL  Final   Special Requests   Final    BOTTLES DRAWN AEROBIC AND ANAEROBIC Blood Culture adequate volume   Culture   Final    NO GROWTH 5 DAYS Performed at [facility], [address] [place], [place] [address]    Report Status 05/03/2018 FINAL  Final    Anti-infectives (From admission, onward)   Start     Dose/Rate Route Frequency Ordered Stop   05/02/18 1000  meropenem (MERREM) 500 mg in sodium chloride 0.9 % 100 mL IVPB     500 mg 200 mL/hr over 30 Minutes Intravenous Every 12 hours 05/02/18 0911     05/01/18 2200  piperacillin-tazobactam (ZOSYN) IVPB 3.375 g  Status:  Discontinued     3.375 g 12.5 mL/hr over 240 Minutes Intravenous Every 12 hours 05/01/18 1952 05/02/18 0838   04/30/18 2200  piperacillin-tazobactam (ZOSYN) IVPB 3.375 g  Status:  Discontinued     3.375 g 12.5 mL/hr over 240 Minutes Intravenous Every 12 hours 04/30/18 0833 05/01/18 1509   04/29/18 0330  piperacillin-tazobactam (ZOSYN) IVPB 3.375 g  Status:  Discontinued     3.375 g 12.5 mL/hr over 240 Minutes Intravenous Every 8 hours 04/29/18 0317 04/30/18 0833   04/27/18 0615  ceFAZolin (ANCEF) 2-4 GM/100ML-% IVPB    Note to Pharmacy:  Lorrene Reid   : cabinet override      04/27/18 0615 04/27/18 0746   04/27/18 0600  ceFAZolin (ANCEF) IVPB 2g/100 mL premix     2 g 200 mL/hr over 30 Minutes Intravenous On call to O.R. 04/26/18 2240 04/27/18 1142     CTAP 6/12: Ileus pattern, mild left hydronephrosis persists, right hydronephrosis resolved, 5 cm hematoma within the hysterectomy bed (unchanged), moderate anasarca  CXR: Minimal bibasilar atelectasis, distended bowels and upper abdomen  Assessment and plan S/P hysterectomy 06/07 with intra-abdominal bleeding complications.  Obstetrics and gynecology following  Severe hematuria -urology following  Left hydronephrosis -  Status post left ureteral stent, Right hydronephrosis - Status post percutaneous nephrostomy tube-follow-up with urology  AKI, oligo-anuric.  Slowly improving creatinine, down to 2.01 from 3.23 yesterday  Hyponatremia: Sodium level stable between 128 on 130.  Trend BMP  Hypokalemia: IV and oral potassium replacements  Acute blood loss anemia: Continue to trend hemoglobin and hematocrit and transfuse as needed  Ileus: Improving with full liquid diet.  Patient encouraged to ambulate and to drink more fluids.  Abdominal x-ray as needed and monitor bowel movements   Madason Rauls S. [facility] ANP-BC Pulmonary and Critical Care Medicine [facility] Pager [phone] or [phone]  NB: This document was prepared using Dragon voice recognition software and may include unintentional dictation errors.

## 2018-05-06 NOTE — Progress Notes (Signed)
Pharmacy Antibiotic Note  [name] is a 42 y.o. female admitted on 04/27/2018 now s/p hysterectomy with intra-abdominal bleeding complications.  Pharmacy has been consulted for meropenem dosing.  Plan: Will increase meropenem to 1000 mg iv q 12 hours with ARF resolving.   Height: 5\' 5"  (165.1 cm)(stated by patient) Weight: 205 lb (93 kg) IBW/kg (Calculated) : 57  Temp (24hrs), Avg:98 F (36.7 C), Min:97.5 F (36.4 C), Max:98.7 F (37.1 C)  Recent Labs  Lab 04/29/18 2226  05/02/18 0402 05/03/18 0158 05/03/18 1611 05/04/18 0447 05/05/18 0637 05/05/18 1226 05/06/18 0422  WBC 18.3*   < > 18.6* 14.9*  --  13.0* 11.9*  --  14.2*  CREATININE 2.90*   < > 4.99* 5.30* 5.39* 4.99* 3.74* 3.23* 2.01*  LATICACIDVEN 1.8  --   --   --   --   --   --   --   --    < > = values in this interval not displayed.    Estimated Creatinine Clearance: 41.1 mL/min (A) (by C-G formula based on SCr of 2.01 mg/dL (H)).    No Known Allergies  Antimicrobials this admission: Zosyn 6/9 >> 6/12 meropenem 6/12 >>   Dose adjustments this admission: Meropenem 500 q 12 >> 1000 q 12  Microbiology results: 6/8 BCx: negative 6/7 MRSA PCR: negative  Thank you for allowing pharmacy to be a part of this patient's care.  Luisa HartChristy, Rashidi Loh D 05/06/2018 1:05 PM

## 2018-05-07 LAB — BASIC METABOLIC PANEL
Anion gap: 13 (ref 5–15)
BUN: 57 mg/dL — AB (ref 6–20)
CO2: 25 mmol/L (ref 22–32)
CREATININE: 1.67 mg/dL — AB (ref 0.44–1.00)
Calcium: 7.5 mg/dL — ABNORMAL LOW (ref 8.9–10.3)
Chloride: 92 mmol/L — ABNORMAL LOW (ref 101–111)
GFR calc Af Amer: 43 mL/min — ABNORMAL LOW (ref >60)
GFR, EST NON AFRICAN AMERICAN: 37 mL/min — AB (ref >60)
Glucose, Bld: 131 mg/dL — ABNORMAL HIGH (ref 65–99)
Potassium: 3.7 mmol/L (ref 3.5–5.1)
SODIUM: 130 mmol/L — AB (ref 135–145)

## 2018-05-07 LAB — PHOSPHORUS: Phosphorus: 3.2 mg/dL (ref 2.5–4.6)

## 2018-05-07 LAB — CBC
HCT: 22.8 % — ABNORMAL LOW (ref 35.0–47.0)
Hemoglobin: 7.7 g/dL — ABNORMAL LOW (ref 12.0–16.0)
MCH: 30.4 pg (ref 26.0–34.0)
MCHC: 33.7 g/dL (ref 32.0–36.0)
MCV: 90.3 fL (ref 80.0–100.0)
PLATELETS: 332 10*3/uL (ref 150–440)
RBC: 2.53 MIL/uL — AB (ref 3.80–5.20)
RDW: 14.7 % — AB (ref 11.5–14.5)
WBC: 17 10*3/uL — ABNORMAL HIGH (ref 3.6–11.0)

## 2018-05-07 LAB — MAGNESIUM: MAGNESIUM: 2.2 mg/dL (ref 1.7–2.4)

## 2018-05-07 MED ORDER — HYDROMORPHONE HCL 1 MG/ML IJ SOLN
0.5000 mg | INTRAMUSCULAR | Status: DC | PRN
  Administered 2018-05-08 – 2018-05-15 (×31): 0.5 mg via INTRAVENOUS
  Filled 2018-05-07 (×33): qty 0.5

## 2018-05-07 MED ORDER — PHENAZOPYRIDINE HCL 200 MG PO TABS
200.0000 mg | ORAL_TABLET | Freq: Three times a day (TID) | ORAL | Status: DC | PRN
  Administered 2018-05-07 – 2018-05-11 (×5): 200 mg via ORAL
  Filled 2018-05-07 (×5): qty 1

## 2018-05-07 MED ORDER — MELATONIN 5 MG PO TABS
5.0000 mg | ORAL_TABLET | Freq: Every evening | ORAL | Status: DC | PRN
  Administered 2018-05-07 – 2018-05-08 (×2): 5 mg via ORAL
  Filled 2018-05-07 (×2): qty 1

## 2018-05-07 MED ORDER — ENSURE ENLIVE PO LIQD
237.0000 mL | Freq: Three times a day (TID) | ORAL | Status: DC
  Administered 2018-05-07 – 2018-05-19 (×21): 237 mL via ORAL

## 2018-05-07 NOTE — Progress Notes (Signed)
[facility], [place] 05/07/18  Subjective:  Patient resting comfortably in bed in the intensive care unit. Good urine output noted at the moment. Case discussed with Dr. Sung Amabile.  Objective:  Vital signs in last 24 hours:  Temp:  [97.9 F (36.6 C)-99.1 F (37.3 C)] 98 F (36.7 C) (06/17 0800) Pulse Rate:  [88-109] 109 (06/17 1000) Resp:  [15-23] 20 (06/17 1000) BP: (111-152)/(57-95) 152/79 (06/17 0900) SpO2:  [95 %-100 %] 98 % (06/17 1000)  Weight change:  Filed Weights   05/03/18 0800  Weight: 93 kg (205 lb)    Intake/Output:    Intake/Output Summary (Last 24 hours) at 05/07/2018 1052 Last data filed at 05/07/2018 0958 Gross per 24 hour  Intake 1406.67 ml  Output 1745 ml  Net -338.33 ml     Physical Exam: General: Ill appearing, laying in bed  HEENT Moist oral mucus membranes  Neck supple  Pulm/lungs Decreased breath sounds at bases, normal erffort  CVS/Heart S1S2 no rubs  Abdomen:  Distended, sluggish bowel sounds  Extremities: 2+ b/l LE edema  Neurologic: Alert, oriented x3, follows commands  Skin: No acute rashes   Foley in place, rt nephrostomy       Basic Metabolic Panel:  Recent Labs  Lab 05/04/18 0447 05/05/18 0637 05/05/18 1226 05/05/18 1939 05/06/18 0422 05/07/18 0556  NA 133* 128* 128*  --  130* 130*  K 3.6 2.9* 3.1* 3.0* 3.2* 3.7  CL 92* 89* 88*  --  90* 92*  CO2 23 24 24   --  24 25  GLUCOSE 115* 136* 135*  --  127* 131*  BUN 90* 91* 86*  --  76* 57*  CREATININE 4.99* 3.74* 3.23*  --  2.01* 1.67*  CALCIUM 7.2* 7.0* 7.0*  --  7.1* 7.5*  MG  --  2.7*  --   --   --  2.2  PHOS  --  5.7*  --   --   --  3.2     CBC: Recent Labs  Lab 05/03/18 0158 05/04/18 0447 05/05/18 0637 05/06/18 0422 05/07/18 0556  WBC 14.9* 13.0* 11.9* 14.2* 17.0*  HGB 7.9* 7.7* 7.7* 7.6* 7.7*  HCT 23.4* 22.5* 22.2* 21.5* 22.8*  MCV 89.4 89.2 89.9 89.7 90.3  PLT 135* 171 208 253 332     No results found for: HEPBSAG, HEPBSAB,  HEPBIGM    Microbiology:  Recent Results (from the past 240 hour(s))  MRSA PCR Screening     Status: None   Collection Time: 04/27/18 10:27 PM  Result Value Ref Range Status   MRSA by PCR NEGATIVE NEGATIVE Final    Comment:        The GeneXpert MRSA Assay (FDA approved for NASAL specimens only), is one component of a comprehensive MRSA colonization surveillance program. It is not intended to diagnose MRSA infection nor to guide or monitor treatment for MRSA infections. Performed at [facility], [address] [place], [place] [address]   CULTURE, BLOOD (ROUTINE X 2) w Reflex to ID Panel     Status: None   Collection Time: 04/28/18  9:24 PM  Result Value Ref Range Status   Specimen Description BLOOD RIGHT HAND  Final   Special Requests   Final    BOTTLES DRAWN AEROBIC AND ANAEROBIC Blood Culture adequate volume   Culture   Final    NO GROWTH 5 DAYS Performed at [facility], [address], [place], [place] [address]    Report Status 05/03/2018  FINAL  Final  CULTURE, BLOOD (ROUTINE X 2) w Reflex to ID Panel     Status: None   Collection Time: 04/28/18  9:44 PM  Result Value Ref Range Status   Specimen Description BLOOD RIGHT ANTECUBITAL  Final   Special Requests   Final    BOTTLES DRAWN AEROBIC AND ANAEROBIC Blood Culture adequate volume   Culture   Final    NO GROWTH 5 DAYS Performed at [facility], [address] [place], [place] [address]    Report Status 05/03/2018 FINAL  Final    Coagulation Studies: No results for input(s): LABPROT, INR in the last 72 hours.  Urinalysis: No results for input(s): COLORURINE, LABSPEC, PHURINE, GLUCOSEU, HGBUR, BILIRUBINUR, KETONESUR, PROTEINUR, UROBILINOGEN, NITRITE, LEUKOCYTESUR in the last 72 hours.  Invalid input(s): APPERANCEUR    Imaging: Dg Abd 1 View  Result Date: 05/06/2018 CLINICAL DATA:  Right nephrostomy. EXAM: ABDOMEN - 1 VIEW COMPARISON:  05/04/2018 FINDINGS: Right  nephrostomy catheter and left ureteral stent remain in place, unchanged. Diffuse gaseous distention of bowel, likely ileus, unchanged. Interval removal of NG tube. No free air or organomegaly. IMPRESSION: Diffuse gaseous distention of bowel most compatible with ileus, unchanged. Electronically Signed   By: Charlett NoseKevin  Dover M.D.   On: 05/06/2018 08:56   [place] Renal  Result Date: 05/06/2018 CLINICAL DATA:  Right nephrostomy, pain. EXAM: RENAL / URINARY TRACT ULTRASOUND COMPLETE COMPARISON:  CT 05/02/2018 FINDINGS: Right Kidney: Length: 12.7 cm. Normal echotexture. No mass. Mild right hydronephrosis, new since prior CT. Left Kidney: Length: 11.3 cm. Normal echotexture. No mass. Mild left hydronephrosis. This is similar to prior CT. Bladder: Not visualized.  Foley catheter in place. IMPRESSION: Mild bilateral hydronephrosis. Electronically Signed   By: Charlett NoseKevin  Dover M.D.   On: 05/06/2018 09:07     Medications:    . docusate  100 mg Oral BID  . heparin injection (subcutaneous)  5,000 Units Subcutaneous Q8H  . nystatin  5 mL Oral QID  . polyethylene glycol  17 g Oral Daily  . sodium chloride flush  10-40 mL Intracatheter Q12H   HYDROmorphone (DILAUDID) injection, lidocaine, Melatonin, menthol-cetylpyridinium, [DISCONTINUED] ondansetron **OR** ondansetron (ZOFRAN) IV, opium-belladonna, phenazopyridine, phenol, promethazine, sodium chloride flush  Assessment/ Plan:  42 y.o. caucasian female is post op abdominal hysterectomy, left salpingo-oophorectomy, laparoscopic lysis of adhesions and retroperitoneal dissection on 04/28/2018.  Followed by cystoscopy with retrograde pyelogram and left ureteral stent placement on 04/29/2018 Baseline creatinine is 0.79 from 04/19/2018 Post op case complicated by ARF, b/l  hydronephrosis  1. ARF, likely multifactorial from severe ATN and b/l hydronephrosis likely from compression of ureters from hematoma, post surgical edema. No ureteral injury.  S/p left ureteral stent and right  nephrostomy 04/30/18.  -Patient appears to be having good urine output.  Creatinine is trended down to 1.67 with a BUN of 57.  This is a good sign.  No indication for dialysis at the moment.  Still has some blood in the Foley bag however.   2. Generalized edema -Patient still has considerable edema but this does appear to be mobilizing.  Avoid diuretics for now.  3. Hypokalemia -Serum potassium up to 3.7.  Continue to monitor closely.  4.  Hyponatremia.  Serum sodium remains stable at 130.  This should improve with improving renal function over time.  5.  Anemia unspecified.  Hemoglobin currently 7.7.  Recommend continued monitoring.    LOS: 10 Mady HaagensenLATEEF, Ezrah Dembeck 6/17/201910:52 AM  [facility] [place], [place] [phone]  Note: This note  was prepared with Dragon dictation. Any transcription errors are unintentional

## 2018-05-07 NOTE — Progress Notes (Signed)
PT PROFILE: 4 F with PMH of GERD, DVT, and DUB, underwent total hysterectomy and bilateral laparoscopic salpingectomy 06/07. 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 Developed hematuria and L hydronephrosis 06/09 requiring cystoscopy and left ureteral stent placement. Renal US 6/10 revealed right hydronephrosis. S/P R percutaneous nephrostomy tube placement 6/10  CTAP 6/12: Ileus pattern, mild left hydronephrosis persists, right hydronephrosis resolved, 5 cm hematoma within the hysterectomy bed (unchanged), moderate anasarca Renal US 06/16: Mild bilateral hydronephrosis  SUBJ: Looks much better than last week.  Edema much improved.  Renal function improving.  Abdominal pain and nausea improved.  Remains very weak.  Vitals:   05/07/18 0700 05/07/18 0800 05/07/18 0900 05/07/18 1000  BP: 123/60 (!) 148/75 (!) 152/79   Pulse: 94 94 (!) 108 (!) 109  Resp: 19 (!) 23 16 20   Temp:  98 F (36.7 C)    TempSrc:  Oral    SpO2: 97% 97% 99% 98%  Weight:      Height:      2 LPM Fox  Gen: No overt distress HEENT: NCAT, sclerae white Neck: No JVD Lungs: Clear to auscultation and percussion Cardiovascular: Regular, no M Abdomen: Soft, NT, ND, NABS Ext: Minimal symmetric LE edema Neuro: Diffusely weak, no focal neurologic deficits   BMP Latest Ref Rng & Units 05/07/2018 05/06/2018 05/05/2018  Glucose 65 - 99 mg/dL 578(I) 696(E) -  BUN 6 - 20 mg/dL 95(M) 84(X) -  Creatinine 0.44 - 1.00 mg/dL 3.24(M) 0.10(U) -  Sodium 135 - 145 mmol/L 130(L) 130(L) -  Potassium 3.5 - 5.1 mmol/L 3.7 3.2(L) 3.0(L)  Chloride 101 - 111 mmol/L 92(L) 90(L) -  CO2 22 - 32 mmol/L 25 24 -  Calcium 8.9 - 10.3 mg/dL 7.5(L) 7.1(L) -    CBC Latest Ref Rng & Units 05/07/2018 05/06/2018 05/05/2018  WBC 3.6 - 11.0  K/uL 17.0(H) 14.2(H) 11.9(H)  Hemoglobin 12.0 - 16.0 g/dL 7.7(L) 7.6(L) 7.7(L)  Hematocrit 35.0 - 47.0 % 22.8(L) 21.5(L) 22.2(L)  Platelets 150 - 440 K/uL 332 253 208     Results for orders placed or performed during the hospital encounter of 04/27/18  MRSA PCR Screening     Status: None   Collection Time: 04/27/18 10:27 PM  Result Value Ref Range Status   MRSA by PCR NEGATIVE NEGATIVE Final    Comment:        The GeneXpert MRSA Assay (FDA approved for NASAL specimens only), is one component of a comprehensive MRSA colonization surveillance program. It is not intended to diagnose MRSA infection nor to guide or monitor treatment for MRSA infections. Performed at [facility], [address] [place], [place] [address]   CULTURE, BLOOD (ROUTINE X 2) w Reflex to ID Panel     Status: None   Collection Time: 04/28/18  9:24 PM  Result Value Ref Range Status   Specimen Description BLOOD RIGHT HAND  Final   Special Requests   Final    BOTTLES DRAWN AEROBIC AND ANAEROBIC Blood Culture adequate volume   Culture   Final    NO GROWTH 5 DAYS Performed at [facility], [address] [place], [place] [address]    Report Status 05/03/2018 FINAL  Final  CULTURE, BLOOD (ROUTINE X 2) w Reflex to ID Panel  Status: None   Collection Time: 04/28/18  9:44 PM  Result Value Ref Range Status   Specimen Description BLOOD RIGHT ANTECUBITAL  Final   Special Requests   Final    BOTTLES DRAWN AEROBIC AND ANAEROBIC Blood Culture adequate volume   Culture   Final    NO GROWTH 5 DAYS Performed at [facility], [address] [place], [place] [address]    Report Status 05/03/2018 FINAL  Final    Anti-infectives (From admission, onward)   Start     Dose/Rate Route Frequency Ordered Stop   05/06/18 2200  meropenem (MERREM) 1 g in sodium chloride 0.9 % 100 mL IVPB  Status:  Discontinued     1 g 200 mL/hr over 30 Minutes Intravenous Every 12 hours 05/06/18 1305 05/07/18  0845   05/02/18 1000  meropenem (MERREM) 500 mg in sodium chloride 0.9 % 100 mL IVPB  Status:  Discontinued     500 mg 200 mL/hr over 30 Minutes Intravenous Every 12 hours 05/02/18 0911 05/06/18 1305   05/01/18 2200  piperacillin-tazobactam (ZOSYN) IVPB 3.375 g  Status:  Discontinued     3.375 g 12.5 mL/hr over 240 Minutes Intravenous Every 12 hours 05/01/18 1952 05/02/18 0838   04/30/18 2200  piperacillin-tazobactam (ZOSYN) IVPB 3.375 g  Status:  Discontinued     3.375 g 12.5 mL/hr over 240 Minutes Intravenous Every 12 hours 04/30/18 0833 05/01/18 1509   04/29/18 0330  piperacillin-tazobactam (ZOSYN) IVPB 3.375 g  Status:  Discontinued     3.375 g 12.5 mL/hr over 240 Minutes Intravenous Every 8 hours 04/29/18 0317 04/30/18 0833   04/27/18 0615  ceFAZolin (ANCEF) 2-4 GM/100ML-% IVPB    Note to Pharmacy:  Lorrene ReidJackson, Pamela   : cabinet override      04/27/18 0615 04/27/18 0746   04/27/18 0600  ceFAZolin (ANCEF) IVPB 2g/100 mL premix     2 g 200 mL/hr over 30 Minutes Intravenous On call to O.R. 04/26/18 2240 04/27/18 1142      CXR: No new film  IMP: S/P hysterectomy 06/07 with intra-abdominal bleeding complications  Status post massive transfusion protocol Persistent hematuria, much improved Left hydronephrosis - Status post left ureteral stent Right hydronephrosis - Status post percutaneous nephrostomy tube AKI, resolving.   Hypervolemia/anasarca, resolved Acute blood loss anemia, not actively bleeding Ileus with nausea, resolved Mildly elevated PCT without clear infectious process  PLAN/REC: Transfer to MedSurg today Discontinue metoclopramide Discontinue meropenem Advance diet and activity as able After transfer, PCCM will sign off. Please call if we can be of further assistance   Billy Fischeravid Elgie Maziarz, MD PCCM service Mobile [phone] Pager [phone] 05/07/2018 11:44 AM

## 2018-05-07 NOTE — Progress Notes (Addendum)
Nutrition Follow-up  DOCUMENTATION CODES:   Obesity unspecified  INTERVENTION:  Provide Ensure Enlive po TID, each supplement provides 350 kcal and 20 grams of protein. Can be decreased as appetite and intake of meals improves.  Encouraged patient to choose a good source of protein at each meal. Discussed foods that contain protein.  NUTRITION DIAGNOSIS:   Inadequate oral intake related to acute illness(AKI, recent surgery) as evidenced by (On Clear Liquid Diet).  Improving as diet was advanced to regular today. Intake remains inadequate, so addressing with Ensure Enlive.  GOAL:   Patient will meet greater than or equal to 90% of their needs  Progressing.  MONITOR:   PO intake, Diet advancement, I & O's, Labs  REASON FOR ASSESSMENT:   NPO/Clear Liquid Diet    ASSESSMENT:   42 year old female with PMHx GERD, DVT, who on 6/7 underwent total hysterectomy, R salpingectomy, L salping-oophorectomy, lysis of adhesions, retroperitoneal dissection, pt with extensive blood loss, developed hemorrhagic shock requiring massive transfusion protocol. Developed hematuria, L hydronephrosis, R hydronephrosis, underwent cytoscopy 6/9 and L ureteral stent, 6/10 R nephrostomy tube.   -Patient initiated clear liquids on 6/12. -On 6/15 NGT was removed and diet was advanced to full liquids. -Today diet is advancing to regular and patient is transferring out of ICU.  Met with patient in her room. She was sitting in her chair. She reports she has been doing "so-so" with the full liquids. She denies any N/V or abdominal pain. Endorses flatus and bowel movements. She had some yogurt this morning on her tray. She is amenable to drinking oral nutrition supplement until her appetite returns to normal and she is able to meet her calorie/protein needs from meals.  Medications reviewed and include: Colace, Miralax.  Labs reviewed: Sodium 130, Chloride 92, BUN 57 (trending down), Creatinine 1.67 (trending  down). Potassium, Phosphorus, and Magnesium all WNL.  I/O: 1870 mL UOP yesterday (0.8 mL/kg/hr); 1 BM yesterday  Discussed with RN and on rounds.  Diet Order:   Diet Order           Diet regular Room service appropriate? Yes; Fluid consistency: Thin  Diet effective now          EDUCATION NEEDS:   No education needs have been identified at this time  Skin:  Skin Assessment: Skin Integrity Issues: Skin Integrity Issues:: Incisions Incisions: closed incisions to abdomen, vagina, and perineum  Last BM:  05/07/2018 - small type 6  Height:   Ht Readings from Last 1 Encounters:  05/04/18 _0  (1.651 m)    Weight:   Wt Readings from Last 1 Encounters:  05/03/18 205 lb (93 kg)    Ideal Body Weight:  56.81 kg  BMI:  Body mass index is 34.11 kg/m.  Estimated Nutritional Needs:   Kcal:  1991-2150 (MSJ x1.2-1.35)  Protein:  100-120 grams  Fluid:  UOP +524m  LWilley Blade MS, RD, LDN Office: [phone]: [phone] Hours/Weekend Pager: [phone]

## 2018-05-07 NOTE — Progress Notes (Signed)
Pt being transferred to room 203. Report called to Shanda BumpsJessica, Charity fundraiserN. Pt and belongings transferred to room 203 without incident.

## 2018-05-07 NOTE — Progress Notes (Signed)
OT Cancellation Note  Patient Details Name: [name] MRN: [id number] DOB: 07/12/1976   Cancelled Treatment:    Reason Eval/Treat Not Completed: Patient declined, no reason specified. Upon attempt, pt with family member in room, lights out. Pt stated "I'm just having a bad day. I know I need therapy, but I'm just having a really bad day." OT offered emotional support and worked with pt to come up with plan for therapy next date. Pt agreeable. Will re-attempt next OT tx session next date.   Richrd PrimeJamie Stiller, MPH, MS, OTR/L ascom [phone] 05/07/18, 4:20 PM

## 2018-05-07 NOTE — Progress Notes (Signed)
   05/07/18 1640  Clinical Encounter Type  Visited With Patient and family together  Visit Type Initial  Referral From Physician (OR: Difficult surgery, ICU for a week, having fear response)  Consult/Referral To Chaplain  Recommendations Follow-up, address depression.   Stress Factors  Patient Stress Factors Health changes;Exhausted   CH received OR to visit patient after a difficult elective hysterectomy surgery with unforeseen complications. The patient was in the ICU for a week and needs emotional support for fear and anxiety. CH visited patient briefly.  Her son was present in the room. Pt was not alert and slow to respond to CH's presence. Pt mentioned having a rough day and having a hard time emotionally and said "I just don't know." Patient said she was mentally and physically exhausted. CH decided that at this point, pt was not ready or open to a longer visit.  CH provided what emotional support and encouragement I could and stressed to pt that I was available and here all night until tomorrow morning if she decided she wanted to talk or process her emotions further.  Nurse mentioned pt acting depressed throughout the day.

## 2018-05-07 NOTE — Progress Notes (Signed)
Patient ID: [name], female   DOB: 12/11/1975, 42 y.o.   MRN: [id number] 9 Days Post-Op  Procedure(s):  1. 04/28/18: ATTEMPTED LAPAROSCOPIC HYSTERECTOMY CONVERTED TO SUPRACERVICAL ABDOMINAL HYSTERECTOMY (N/A) SALPINGO OOPHORECTOMY (Left) CYSTOSCOPY (N/A) UNILATERAL SALPINGECTOMY (Right) LAPAROSCOPIC LYSIS OF ADHESIONS, RETROPERITONEAL DISSECTION  2. 04/29/18: CYSTOSCOPY WITH RETROGRADE PYELOGRAM/Left URETERAL STENT PLACEMENT, ureteroscopy (Bilateral)    Subjective: Now discharged from ICU, in med surg with feelings of anxiety and depression with fear response  [age] F with PMH of GERD, DVT, and DUB, underwenttotal hysterectomy and bilateral laparoscopic salpingectomy 06/07. 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 Developed hematuria and L hydronephrosis 06/09 requiring cystoscopy and left ureteral stent placement. Renal US 6/10 revealed right hydronephrosis. S/P R percutaneous nephrostomy tube placement 6/10  Now s/p NG tube, Foley in place. UOP improved. Remains weak. Swelling improved, difficulty with PT.  Objective: Vital signs in last 24 hours: Temp:  [97.9 F (36.6 C)-98.3 F (36.8 C)] 97.9 F (36.6 C) (06/17 1159) Pulse Rate:  [93-110] 110 (06/17 1159) Resp:  [14-23] 14 (06/17 1159) BP: (122-152)/(60-95) 129/64 (06/17 1159) SpO2:  [95 %-100 %] 95 % (06/17 1159)  Intake/Output  Intake/Output Summary (Last 24 hours) at 05/07/2018 1502 Last data filed at 05/07/2018 1200 Gross per 24 hour  Intake 1456.67 ml  Output 1270 ml  Net 186.67 ml    Physical Exam:  General: Oriented and responsive, appears less ill. CV: Regular rhythm, no murmurs, increased rate Lungs: Decreased breath sounds in bilateral bases, L>R GI: Tense, moderately-distended. Ecchymosis  over mons deepening, and lap incisions clean and dry. +bowel sounds.   Incisions: Clean and dry, honeycomb dressing in place. Urine: Pink, Foley in place Extremities: Nontender, no erythema, improving edema in lower extremities less tense than yesterday, with a bola on left lateral arm at site of prior IV, covered with a dressing. Fingers, hands and arms are no longer edematous.  Lab Results: Recent Labs    05/05/18 0637 05/06/18 0422 05/07/18 0556  HGB 7.7* 7.6* 7.7*  HCT 22.2* 21.5* 22.8*  WBC 11.9* 14.2* 17.0*  PLT 208 253 332                 Results for orders placed or performed during the hospital encounter of 04/27/18 (from the past 24 hour(s))  CBC     Status: Abnormal   Collection Time: 05/07/18  5:56 AM  Result Value Ref Range   WBC 17.0 (H) 3.6 - 11.0 K/uL   RBC 2.53 (L) 3.80 - 5.20 MIL/uL   Hemoglobin 7.7 (L) 12.0 - 16.0 g/dL   HCT 78.222.8 (L) 95.635.0 - 21.347.0 %   MCV 90.3 80.0 - 100.0 fL   MCH 30.4 26.0 - 34.0 pg   MCHC 33.7 32.0 - 36.0 g/dL   RDW 08.614.7 (H) 57.811.5 - 46.914.5 %   Platelets 332 150 - 440 K/uL  Basic metabolic panel     Status: Abnormal   Collection Time: 05/07/18  5:56 AM  Result Value Ref Range   Sodium 130 (L) 135 - 145 mmol/L   Potassium 3.7 3.5 - 5.1 mmol/L   Chloride 92 (L) 101 - 111 mmol/L   CO2 25 22 - 32 mmol/L   Glucose, Bld 131 (H) 65 - 99 mg/dL   BUN 57 (H) 6 -  20 mg/dL   Creatinine, Ser 1.61 (H) 0.44 - 1.00 mg/dL   Calcium 7.5 (L) 8.9 - 10.3 mg/dL   GFR calc non Af Amer 37 (L) >60 mL/min   GFR calc Af Amer 43 (L) >60 mL/min   Anion gap 13 5 - 15  Magnesium     Status: None   Collection Time: 05/07/18  5:56 AM  Result Value Ref Range   Magnesium 2.2 1.7 - 2.4 mg/dL  Phosphorus     Status: None   Collection Time: 05/07/18  5:56 AM  Result Value Ref Range   Phosphorus 3.2 2.5 - 4.6 mg/dL    Assessment/Plan: 9 Days Post-Op        Procedure(s): 1. 04/28/18: ATTEMPTED LAPAROSCOPIC HYSTERECTOMY CONVERTED TO SUPRACERVICAL ABDOMINAL  HYSTERECTOMY (N/A) SALPINGO OOPHORECTOMY (Left) CYSTOSCOPY (N/A) UNILATERAL SALPINGECTOMY (Right) LAPAROSCOPIC LYSIS OF ADHESIONS, RETROPERITONEAL DISSECTION   2. 04/29/18: CYSTOSCOPY WITH RETROGRADE PYELOGRAM/Left URETERAL STENT PLACEMENT, ureteroscopy (Bilateral)  IMP: S/P hysterectomy 06/07 with intra-abdominal bleeding complications Status post massive transfusion protocol Persistent hematuria, much improved Left hydronephrosis - Status post left ureteral stent Right hydronephrosis - Status post percutaneous nephrostomy tube AKI, resolving.   Hypervolemia/anasarca, resolved Acute blood loss anemia, not actively bleeding Ileus with nausea, resolved Mildly elevated PCT without clear infectious process  PLAN/REC: Appreciate nephro recs Consult to psychiatry to consider chemical help for mood support Consult to pastoral care Continue physical therapy. Very deconditioned. Will get case management involved for rehab planning Advance diet as tolerated   Christeen Douglas, MD   LOS: 10 days   Christeen Douglas 05/07/2018, 3:02 PM

## 2018-05-07 NOTE — Progress Notes (Signed)
Patient alert and has family at bedside. Edema noted to bilateral extremities. Right nephrostomy tube patent.  Flushed with 4ccs sterile saline. Patient noted to have clot in foley catheter.  Catheter flushed with 40 cc NS.  No other concerns at this time.  PRN pain med given x 2.  Hand off given to oncoming nurse at 2300.

## 2018-05-07 NOTE — Progress Notes (Signed)
PT Cancellation Note  Patient Details Name: [name] MRN: [id number] DOB: 04/22/1976   Cancelled Treatment:    Reason Eval/Treat Not Completed: Patient declined, no reason specified. Pt having a "difficult" day and would prefer therapy attempt tomorrow. Re attempt tomorrow.    Scot DockHeidi E Barnes, PTA 05/07/2018, 4:59 PM

## 2018-05-07 NOTE — Progress Notes (Signed)
Urology Consult Follow Up  Subjective: Creatinine continues to trend down.  Urine remains blood-tinged and Foley, required flush for small clot over the past 24 hours.  Nephrostomy draining well.  Complains of bladder pain.  Anti-infectives: Anti-infectives (From admission, onward)   Start     Dose/Rate Route Frequency Ordered Stop   05/06/18 2200  meropenem (MERREM) 1 g in sodium chloride 0.9 % 100 mL IVPB  Status:  Discontinued     1 g 200 mL/hr over 30 Minutes Intravenous Every 12 hours 05/06/18 1305 05/07/18 0845   05/02/18 1000  meropenem (MERREM) 500 mg in sodium chloride 0.9 % 100 mL IVPB  Status:  Discontinued     500 mg 200 mL/hr over 30 Minutes Intravenous Every 12 hours 05/02/18 0911 05/06/18 1305   05/01/18 2200  piperacillin-tazobactam (ZOSYN) IVPB 3.375 g  Status:  Discontinued     3.375 g 12.5 mL/hr over 240 Minutes Intravenous Every 12 hours 05/01/18 1952 05/02/18 0838   04/30/18 2200  piperacillin-tazobactam (ZOSYN) IVPB 3.375 g  Status:  Discontinued     3.375 g 12.5 mL/hr over 240 Minutes Intravenous Every 12 hours 04/30/18 0833 05/01/18 1509   04/29/18 0330  piperacillin-tazobactam (ZOSYN) IVPB 3.375 g  Status:  Discontinued     3.375 g 12.5 mL/hr over 240 Minutes Intravenous Every 8 hours 04/29/18 0317 04/30/18 0833   04/27/18 0615  ceFAZolin (ANCEF) 2-4 GM/100ML-% IVPB    Note to Pharmacy:  Lorrene ReidJackson, Pamela   : cabinet override      04/27/18 0615 04/27/18 0746   04/27/18 0600  ceFAZolin (ANCEF) IVPB 2g/100 mL premix     2 g 200 mL/hr over 30 Minutes Intravenous On call to O.R. 04/26/18 2240 04/27/18 1142      Current Facility-Administered Medications  Medication Dose Route Frequency Provider Last Rate Last Dose  . docusate (COLACE) 50 MG/5ML liquid 100 mg  100 mg Oral BID Christeen DouglasBeasley, Bethany, MD   100 mg at 05/06/18 0913  . feeding supplement (ENSURE ENLIVE) (ENSURE ENLIVE) liquid 237 mL  237 mL Oral TID BM Merwyn KatosSimonds, David B, MD   237 mL at 05/07/18 1323  .  heparin injection 5,000 Units  5,000 Units Subcutaneous Q8H Merwyn KatosSimonds, David B, MD   5,000 Units at 05/07/18 0553  . HYDROmorphone (DILAUDID) injection 0.5 mg  0.5 mg Intravenous Q4H PRN Merwyn KatosSimonds, David B, MD      . lidocaine (XYLOCAINE) 5 % ointment   Topical BID PRN Merwyn KatosSimonds, David B, MD      . Melatonin TABS 5 mg  5 mg Oral QHS PRN Tukov-Yual, Magdalene S, NP   5 mg at 05/07/18 0211  . menthol-cetylpyridinium (CEPACOL) lozenge 3 mg  1 lozenge Oral Q2H PRN Ray ChurchBell, Eugene D III, MD      . nystatin (MYCOSTATIN) 100000 UNIT/ML suspension 500,000 Units  5 mL Oral QID Conforti, John, DO   500,000 Units at 05/06/18 1331  . ondansetron (ZOFRAN) injection 4 mg  4 mg Intravenous Q6H PRN Ray ChurchBell, Eugene D III, MD   4 mg at 05/03/18 0350  . opium-belladonna (B&O SUPPRETTES) 16.2-60 MG suppository 1 suppository  1 suppository Rectal Q8H PRN Rene PaciWinter, Christopher Aaron, MD   1 suppository at 05/07/18 0400  . phenazopyridine (PYRIDIUM) tablet 200 mg  200 mg Oral Q8H PRN Rene PaciWinter, Christopher Aaron, MD   200 mg at 05/07/18 0817  . phenol (CHLORASEPTIC) mouth spray 1 spray  1 spray Mouth/Throat PRN Merwyn KatosSimonds, David B, MD      . polyethylene  glycol (MIRALAX / GLYCOLAX) packet 17 g  17 g Oral Daily Christeen Douglas, MD   17 g at 05/06/18 0913  . promethazine (PHENERGAN) injection 12.5-25 mg  12.5-25 mg Intravenous Q6H PRN Tukov-Yual, Magdalene S, NP   25 mg at 05/03/18 0000  . sodium chloride flush (NS) 0.9 % injection 10-40 mL  10-40 mL Intracatheter Q12H Eugenie Norrie, NP   10 mL at 05/07/18 1105  . sodium chloride flush (NS) 0.9 % injection 10-40 mL  10-40 mL Intracatheter PRN Eugenie Norrie, NP         Objective: Vital signs in last 24 hours: Temp:  [97.9 F (36.6 C)-98 F (36.7 C)] 97.9 F (36.6 C) (06/17 1159) Pulse Rate:  [93-110] 110 (06/17 1159) Resp:  [14-23] 14 (06/17 1159) BP: (122-152)/(60-95) 129/64 (06/17 1159) SpO2:  [95 %-100 %] 95 % (06/17 1159)  Intake/Output from previous day: 06/16 0701 -  06/17 0700 In: 860 [P.O.:720; I.V.:30; IV Piggyback:50] Out: 1870 [Urine:1870] Intake/Output this shift: Total I/O In: 646.7 [P.O.:240; Other:120; IV Piggyback:286.7] Out: 200 [Urine:200]   Physical Exam  Awake, alert and oriented today NGT in place Abdomen distended, appropriately tender Foley in place cranberry colored urine without obvious clots Right nephrostomy tube draining orange-tinged urine, ? Pyridium Right nephrostomy draining urine.  Diffuse anasarca appreciated   Lab Results:  Recent Labs    05/06/18 0422 05/07/18 0556  WBC 14.2* 17.0*  HGB 7.6* 7.7*  HCT 21.5* 22.8*  PLT 253 332   BMET Recent Labs    05/06/18 0422 05/07/18 0556  NA 130* 130*  K 3.2* 3.7  CL 90* 92*  CO2 24 25  GLUCOSE 127* 131*  BUN 76* 57*  CREATININE 2.01* 1.67*  CALCIUM 7.1* 7.5*    Studies/Results: Dg Abd 1 View  Result Date: 05/06/2018 CLINICAL DATA:  Right nephrostomy. EXAM: ABDOMEN - 1 VIEW COMPARISON:  05/04/2018 FINDINGS: Right nephrostomy catheter and left ureteral stent remain in place, unchanged. Diffuse gaseous distention of bowel, likely ileus, unchanged. Interval removal of NG tube. No free air or organomegaly. IMPRESSION: Diffuse gaseous distention of bowel most compatible with ileus, unchanged. Electronically Signed   By: Charlett Nose M.D.   On: 05/06/2018 08:56   US Renal  Result Date: 05/06/2018 CLINICAL DATA:  Right nephrostomy, pain. EXAM: RENAL / URINARY TRACT ULTRASOUND COMPLETE COMPARISON:  CT 05/02/2018 FINDINGS: Right Kidney: Length: 12.7 cm. Normal echotexture. No mass. Mild right hydronephrosis, new since prior CT. Left Kidney: Length: 11.3 cm. Normal echotexture. No mass. Mild left hydronephrosis. This is similar to prior CT. Bladder: Not visualized.  Foley catheter in place. IMPRESSION: Mild bilateral hydronephrosis. Electronically Signed   By: Charlett Nose M.D.   On: 05/06/2018 09:07   Renal ultrasound personally reviewed.  Assessment/ Plan:  1.   Gross hematuria/ bladder pain-   improving.  Continue to flush Foley as needed.  Consider B&O suppository for bladder spasms as needed, would avoid oral anticholinergics given ileus  2.  Acute kidney injury-Improving.    Case was discussed with Dr. Dalbert Garnet today.  At some point as her creatinine continues to improve, would recommend an antegrade nephrostogram and clamp trial nephrostomy tube for 24 hours with possible removal.  We will hold off for now but perhaps later this week as she improves.  Additionally, when she becomes more ambulatory, would recommend placing Foley catheter.    We will see her again midweek or sooner as needed.  Please page with questions or concerns.  LOS: 10 days    [name] 05/07/2018

## 2018-05-08 DIAGNOSIS — F4323 Adjustment disorder with mixed anxiety and depressed mood: Secondary | ICD-10-CM

## 2018-05-08 LAB — BASIC METABOLIC PANEL
ANION GAP: 11 (ref 5–15)
BUN: 55 mg/dL — ABNORMAL HIGH (ref 6–20)
CO2: 26 mmol/L (ref 22–32)
Calcium: 8 mg/dL — ABNORMAL LOW (ref 8.9–10.3)
Chloride: 92 mmol/L — ABNORMAL LOW (ref 101–111)
Creatinine, Ser: 1.43 mg/dL — ABNORMAL HIGH (ref 0.44–1.00)
GFR calc non Af Amer: 44 mL/min — ABNORMAL LOW (ref >60)
GFR, EST AFRICAN AMERICAN: 52 mL/min — AB (ref >60)
GLUCOSE: 130 mg/dL — AB (ref 65–99)
Potassium: 3.9 mmol/L (ref 3.5–5.1)
Sodium: 129 mmol/L — ABNORMAL LOW (ref 135–145)

## 2018-05-08 LAB — PREPARE RBC (CROSSMATCH)

## 2018-05-08 MED ORDER — MIRTAZAPINE 15 MG PO TABS
15.0000 mg | ORAL_TABLET | Freq: Every day | ORAL | Status: DC
  Administered 2018-05-08 – 2018-05-18 (×11): 15 mg via ORAL
  Filled 2018-05-08 (×11): qty 1

## 2018-05-08 NOTE — Progress Notes (Signed)
Patient ID: [name], female   DOB: 07/23/1976, 42 y.o.   MRN: [id number] 10 Days Post-Op  Procedure(s):  1. 04/28/18: ATTEMPTED LAPAROSCOPIC HYSTERECTOMY CONVERTED TO SUPRACERVICAL ABDOMINAL HYSTERECTOMY (N/A) SALPINGO OOPHORECTOMY (Left) CYSTOSCOPY (N/A) UNILATERAL SALPINGECTOMY (Right) LAPAROSCOPIC LYSIS OF ADHESIONS, RETROPERITONEAL DISSECTION  2. 04/29/18: CYSTOSCOPY WITH RETROGRADE PYELOGRAM/Left URETERAL STENT PLACEMENT, ureteroscopy (Bilateral)    Subjective: Now discharged from ICU, in med surg with feelings of anxiety and depression with fear response  [age] F with PMH of GERD, DVT, and DUB, underwenttotal hysterectomy and bilateral laparoscopic salpingectomy 06/07. 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 Developed hematuria and L hydronephrosis 06/09 requiring cystoscopy and left ureteral stent placement. Renal US 6/10 revealed right hydronephrosis. S/P R percutaneous nephrostomy tube placement 6/10  Now s/p NG tube, Foley in place. UOP improved. Remains weak. Swelling improved, difficulty with PT.  Objective: Vital signs in last 24 hours: Temp:  [97.5 F (36.4 C)-99 F (37.2 C)] 97.5 F (36.4 C) (06/18 1415) Pulse Rate:  [101-114] 109 (06/18 1415) Resp:  [18-21] 21 (06/18 0405) BP: (117-132)/(59-65) 128/60 (06/18 1415) SpO2:  [96 %-99 %] 99 % (06/18 1415)  Intake/Output  Intake/Output Summary (Last 24 hours) at 05/08/2018 1906 Last data filed at 05/08/2018 1518 Gross per 24 hour  Intake 360 ml  Output 1400 ml  Net -1040 ml    Physical Exam:  General: Oriented and responsive, appears anxious but no longer ill CV: Regular rhythm, no murmurs, regular rate Lungs: Decreased breath sounds in bilateral bases, L>R GI: Non-tender, non-distended. Normal  bowel sounds. Ecchymosis over mons improving, and lap incisions clean and dry.  Incisions: Clean and dry, honeycomb dressing in place. Urine: Pink, Foley in place Extremities: Nontender, no erythema, improving edema in lower extremities Fingers, hands and arms are no longer edematous.  Lab Results: Recent Labs    05/06/18 0422 05/07/18 0556  HGB 7.6* 7.7*  HCT 21.5* 22.8*  WBC 14.2* 17.0*  PLT 253 332                 Results for orders placed or performed during the hospital encounter of 04/27/18 (from the past 24 hour(s))  Basic metabolic panel     Status: Abnormal   Collection Time: 05/08/18  6:30 AM  Result Value Ref Range   Sodium 129 (L) 135 - 145 mmol/L   Potassium 3.9 3.5 - 5.1 mmol/L   Chloride 92 (L) 101 - 111 mmol/L   CO2 26 22 - 32 mmol/L   Glucose, Bld 130 (H) 65 - 99 mg/dL   BUN 55 (H) 6 - 20 mg/dL   Creatinine, Ser 4.011.43 (H) 0.44 - 1.00 mg/dL   Calcium 8.0 (L) 8.9 - 10.3 mg/dL   GFR calc non Af Amer 44 (L) >60 mL/min   GFR calc Af Amer 52 (L) >60 mL/min   Anion gap 11 5 - 15    Assessment/Plan: 10 Days Post-Op        Procedure(s): 1. 04/28/18: ATTEMPTED LAPAROSCOPIC HYSTERECTOMY CONVERTED TO SUPRACERVICAL ABDOMINAL HYSTERECTOMY (N/A) SALPINGO OOPHORECTOMY (Left) CYSTOSCOPY (N/A) UNILATERAL SALPINGECTOMY (Right) LAPAROSCOPIC LYSIS OF ADHESIONS, RETROPERITONEAL DISSECTION   2. 04/29/18: CYSTOSCOPY WITH RETROGRADE PYELOGRAM/Left URETERAL STENT PLACEMENT, ureteroscopy (Bilateral)  IMP: S/P hysterectomy 06/07 with intra-abdominal bleeding complications Status post massive transfusion protocol Anxiety/situational depression Persistent  hematuria, much improved Left hydronephrosis - Status post left ureteral stent Right hydronephrosis - Status post percutaneous nephrostomy tube AKI, resolving.   Hypervolemia/anasarca, resolved Acute blood loss anemia, not actively bleeding Ileus with nausea, resolved Mildly elevated PCT without clear infectious  process Generalized edema- lower extremities, resolving  PLAN/REC: Appreciate psych consult and recommendations Appreciate pastoral care  Appreciate nephro recs Consult to psychiatry to consider chemical help for mood support Consult to pastoral care Continue physical therapy. Very deconditioned. Case management planning for possible inpatient rehab, though may not require. Regular diet Plan to d/c IV access tomorrow- still has A line   Christeen Douglas, MD   LOS: 11 days   Christeen Douglas 05/08/2018, 7:06 PMPatient ID: [name], female   DOB: May 11, 1976, 42 y.o.   MRN: [id number]

## 2018-05-08 NOTE — Progress Notes (Signed)
Physical Therapy Treatment Patient Details Name: [name] MRN: [id number] DOB: 07-08-1976 Today's Date: 05/08/2018    History of Present Illness Pt is a 42 y/o F s/p laparascopic hysterectomy, Bil aslpingectomy 6/7.  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  Developed hematuria and L hydronephrosis 06/09 requiring cystoscopy and left ureteral stent placement. Renal US 6/10 revealed right hydronephrosis. S/P R percutaneous nephrostomy tube placement 6/10.  NG tube placed 6/10.  Pt with AKI and ileus.  Pt's PMH includes DVT.     PT Comments    Pt in bed.  Appears more alert and less fatigued today. Participated in exercises as described below.  Ready to try mobility skills.  To edge of bed with min/mod a x 2 to sit with increased ability of pt to assist.  Once sitting, a large step stool was used to support feet as bed does not lower further and pt is of shorter stature.  This allowed for improved position and feeling of support while sitting.  She continued to use BUE support and preferred her hands in a more post position leaning backwards on the bed.  After 2 minutes she did complain of dizziness and requested to return to supine.  BP 125/69 P 109 while laying down.  Discussed importance of mobility and out of bed.  Pt stated the lift bed is generally uncomfortable and she is not sleeping and she feels it is negatively impacting her.  Pt stated she was in the chair for a short time yesterday and used [facility] lift.  Michiel Sites was obtained but unable to get [facility] legs under the bed due to design.  Discussed with pt, RN and MD regarding options and at the end pt chose to try a regular hospital bed to allow for [facility] lift use.  RN called for a bed as one was not available on unit.  After 40 minutes bed had not arrived on unit and IV team in to start IV.  Discussed with RN.  Will continue as appropriate.     Pt did have overall improved tolerance for activities today.  While fear remains a barrier for pt, she was not exhausted after session as she has been previously.   Follow Up Recommendations  CIR     Equipment Recommendations  Rolling walker with 5" wheels    Recommendations for Other Services       Precautions / Restrictions Precautions Precautions: Fall Precaution Comments: ureteral drain, R nephrostomy tube, L IJ CVC line Restrictions Weight Bearing Restrictions: No    Mobility  Bed Mobility Overal bed mobility: Needs Assistance Bed Mobility: Supine to Sit;Sit to Supine     Supine to sit: Min assist;Mod assist;+2 for physical assistance Sit to supine: Mod assist;+2 for physical assistance      Transfers                    Ambulation/Gait                 Stairs             Wheelchair Mobility    Modified Rankin (Stroke Patients Only)       Balance Overall balance assessment: Needs assistance Sitting-balance support: Bilateral upper extremity supported;Feet supported Sitting balance-Leahy Scale: Poor Sitting balance - Comments: needs min assist for sitting depsite feet and hand support.  Cognition Arousal/Alertness: Awake/alert Behavior During Therapy: WFL for tasks assessed/performed Overall Cognitive Status: Within Functional Limits for tasks assessed                                        Exercises Other Exercises Other Exercises: BLE AAROM ankle pumps, heel slides, ab/add and SLR with overall improvement in range and assist level    General Comments        Pertinent Vitals/Pain Pain Assessment: Faces Faces Pain Scale: Hurts little more Pain Location: bladder spasm discomfory Pain Descriptors / Indicators: Discomfort Pain Intervention(s): Limited activity within patient's tolerance;Monitored during session    Home Living                       Prior Function            PT Goals (current goals can now be found in the care plan section) Progress towards PT goals: Progressing toward goals    Frequency    7X/week      PT Plan Current plan remains appropriate    Co-evaluation              AM-PAC PT "6 Clicks" Daily Activity  Outcome Measure  Difficulty turning over in bed (including adjusting bedclothes, sheets and blankets)?: Unable Difficulty moving from lying on back to sitting on the side of the bed? : Unable Difficulty sitting down on and standing up from a chair with arms (e.g., wheelchair, bedside commode, etc,.)?: Unable Help needed moving to and from a bed to chair (including a wheelchair)?: Total Help needed walking in hospital room?: Total Help needed climbing 3-5 steps with a railing? : Total 6 Click Score: 6    End of Session   Activity Tolerance: Patient tolerated treatment well;Patient limited by fatigue;Other (comment) Patient left: in bed;with call bell/phone within reach;with family/visitor present;with bed alarm set Nurse Communication: Mobility status;Other (comment)       Time: [id number] PT Time Calculation (min) (ACUTE ONLY): 65 min  Charges:  $Therapeutic Exercise: 23-37 mins $Therapeutic Activity: 23-37 mins                    G Codes:       Danielle DessSarah Shoshanna Mcquitty, PTA 05/08/18, 11:58 AM

## 2018-05-08 NOTE — Consult Note (Signed)
[facility] Consult   Reason for Consult: Consult for 42 year old woman with no past psychiatric history because of concern about mood and anxiety Referring Physician: Leafy Ro Patient Identification: [name] MRN:  [id number] Principal Diagnosis: Adjustment disorder with mixed anxiety and depressed mood Diagnosis:   Patient Active Problem List   Diagnosis Date Noted  . Adjustment disorder with mixed anxiety and depressed mood [F43.23] 05/08/2018  . S/P abdominal hysterectomy [Z90.710] 04/27/2018    Total Time spent with patient: 1 hour  Subjective:   [name] is a 42 y.o. female patient admitted with "I just get worried".  HPI: Patient interviewed chart reviewed.  This is a 42 year old woman who came into the hospital for what was scheduled to be a routine hysterectomy.  Complications occurred and the patient has now been in the hospital for about a week and a half and is recovering from blood loss .  Patient admits that she is getting frustrated with her hospital stay.  Her mood goes up and down.  Yesterday was a particularly bad day and she was having crying spells.  She feels like she is not going to be ready to leave the hospital by the end of the week and does not feel she is making adequate progress.  She says her chief worry is for her family.  She has never been away from them this long.  Also has some worry about her own health.  She denies any psychotic symptoms.  Denies suicidal or homicidal thoughts.  Appetite has been poor and she feels that food does not taste good.  Not able to sleep well at night.  Social history: Patient is married has 3 children 2 of whom still live at home.  She says she has a good supportive relationship with all of her family.  Medical history: Recovering from heavy bleeding during a hysterectomy.  Prior to this no previous significant medical problems.  Substance abuse history: Denies any  Past Psychiatric History: Patient  has no past psychiatric history although she does say that at some time she has been prescribed Prozac.  Her only understanding of that was that it was to "level things out".  No history of seeing a psychiatrist that she recalls.  Never been hospitalized.  No history of suicide attempt or psychosis.  Risk to Self:   Risk to Others:   Prior Inpatient Therapy:   Prior Outpatient Therapy:    Past Medical History:  Past Medical History:  Diagnosis Date  . DVT (deep venous thrombosis) ([place]) 1992   3 blood clots after pregnancy (one in each leg and one in abdomen)  . GERD (gastroesophageal reflux disease)     Past Surgical History:  Procedure Laterality Date  . CESAREAN SECTION  2000  . CYSTOSCOPY N/A 04/27/2018   Procedure: CYSTOSCOPY;  Surgeon: Benjaman Kindler, MD;  Location: [facility] ORS;  Service: Gynecology;  Laterality: N/A;  . CYSTOSCOPY W/ URETERAL STENT PLACEMENT Bilateral 04/28/2018   Procedure: CYSTOSCOPY WITH RETROGRADE PYELOGRAM/Left URETERAL STENT PLACEMENT, ureteroscopy;  Surgeon: Lucas Mallow, MD;  Location: [facility] ORS;  Service: Urology;  Laterality: Bilateral;  . IR NEPHROSTOMY PLACEMENT RIGHT  04/30/2018  . LAPAROSCOPIC LYSIS OF ADHESIONS  04/27/2018   Procedure: LAPAROSCOPIC LYSIS OF ADHESIONS, RETROPERITONEAL DISSECTION;  Surgeon: Benjaman Kindler, MD;  Location: [facility] ORS;  Service: Gynecology;;  . SALPINGOOPHORECTOMY Left 04/27/2018   Procedure: SALPINGO OOPHORECTOMY;  Surgeon: Benjaman Kindler, MD;  Location: [facility] ORS;  Service: Gynecology;  Laterality: Left;  .  SUPRACERVICAL ABDOMINAL HYSTERECTOMY N/A 04/27/2018   Procedure: ATTEMPTED LAPAROSCOPIC HYSTERECTOMY CONVERTED TO SUPRACERVICAL ABDOMINAL HYSTERECTOMY;  Surgeon: Benjaman Kindler, MD;  Location: [facility] OR[facility];  Service: Gynecology;  Laterality: N/A;  . UNILATERAL SALPINGECTOMY Right 04/27/2018   Procedure: UNILATERAL SALPINGECTOMY;  Surgeon: Benjaman Kindler, MD;  Location: [facility] ORS;  Service: Gynecology;  Laterality: Right;  .  VENOUS THROMBECTOMY  1992   Family History:  Family History  Problem Relation Age of Onset  . Diabetes Mother   . Hypertension Mother   . Addison's disease Mother    Family Psychiatric  History: She thinks her mother had nerve problems Social History:  Social History   Substance and Sexual Activity  Alcohol Use Yes   Comment: occassional     Social History   Substance and Sexual Activity  Drug Use No    Social History   Socioeconomic History  . Marital status: Married    Spouse name: Not on file  . Number of children: Not on file  . Years of education: Not on file  . Highest education level: Not on file  Occupational History  . Not on file  Social Needs  . Financial resource strain: Not on file  . Food insecurity:    Worry: Not on file    Inability: Not on file  . Transportation needs:    Medical: Not on file    Non-medical: Not on file  Tobacco Use  . Smoking status: Never Smoker  . Smokeless tobacco: Never Used  Substance and Sexual Activity  . Alcohol use: Yes    Comment: occassional  . Drug use: No  . Sexual activity: Not on file  Lifestyle  . Physical activity:    Days per week: Not on file    Minutes per session: Not on file  . Stress: Not on file  Relationships  . Social connections:    Talks on phone: Not on file    Gets together: Not on file    Attends religious service: Not on file    Active member of club or organization: Not on file    Attends meetings of clubs or organizations: Not on file    Relationship status: Not on file  Other Topics Concern  . Not on file  Social History Narrative  . Not on file   Additional Social History:    Allergies:  No Known Allergies  Labs:  Results for orders placed or performed during the hospital encounter of 04/27/18 (from the past 48 hour(s))  CBC     Status: Abnormal   Collection Time: 05/07/18  5:56 AM  Result Value Ref Range   WBC 17.0 (H) 3.6 - 11.0 K/uL   RBC 2.53 (L) 3.80 - 5.20 MIL/uL    Hemoglobin 7.7 (L) 12.0 - 16.0 g/dL   HCT 22.8 (L) 35.0 - 47.0 %   MCV 90.3 80.0 - 100.0 fL   MCH 30.4 26.0 - 34.0 pg   MCHC 33.7 32.0 - 36.0 g/dL   RDW 14.7 (H) 11.5 - 14.5 %   Platelets 332 150 - 440 K/uL    Comment: Performed at [facility], [address] [place], [place] [address]  Basic metabolic panel     Status: Abnormal   Collection Time: 05/07/18  5:56 AM  Result Value Ref Range   Sodium 130 (L) 135 - 145 mmol/L   Potassium 3.7 3.5 - 5.1 mmol/L   Chloride 92 (L) 101 - 111 mmol/L   CO2 25 22 - 32  mmol/L   Glucose, Bld 131 (H) 65 - 99 mg/dL   BUN 57 (H) 6 - 20 mg/dL   Creatinine, Ser 1.67 (H) 0.44 - 1.00 mg/dL   Calcium 7.5 (L) 8.9 - 10.3 mg/dL   GFR calc non Af Amer 37 (L) >60 mL/min   GFR calc Af Amer 43 (L) >60 mL/min    Comment: (NOTE) The eGFR has been calculated using the CKD EPI equation. This calculation has not been validated in all clinical situations. eGFR's persistently <60 mL/min signify possible Chronic Kidney Disease.    Anion gap 13 5 - 15    Comment: Performed at [facility], [address] [place], [place] [address]  Magnesium     Status: None   Collection Time: 05/07/18  5:56 AM  Result Value Ref Range   Magnesium 2.2 1.7 - 2.4 mg/dL    Comment: Performed at [facility], [address] [place], [place] [address]  Phosphorus     Status: None   Collection Time: 05/07/18  5:56 AM  Result Value Ref Range   Phosphorus 3.2 2.5 - 4.6 mg/dL    Comment: Performed at [facility], [address] [place], [place] [address]  Basic metabolic panel     Status: Abnormal   Collection Time: 05/08/18  6:30 AM  Result Value Ref Range   Sodium 129 (L) 135 - 145 mmol/L   Potassium 3.9 3.5 - 5.1 mmol/L   Chloride 92 (L) 101 - 111 mmol/L   CO2 26 22 - 32 mmol/L   Glucose, Bld 130 (H) 65 - 99 mg/dL   BUN 55 (H) 6 - 20 mg/dL   Creatinine, Ser 1.43 (H) 0.44 - 1.00 mg/dL   Calcium 8.0 (L) 8.9 - 10.3 mg/dL   GFR calc  non Af Amer 44 (L) >60 mL/min   GFR calc Af Amer 52 (L) >60 mL/min    Comment: (NOTE) The eGFR has been calculated using the CKD EPI equation. This calculation has not been validated in all clinical situations. eGFR's persistently <60 mL/min signify possible Chronic Kidney Disease.    Anion gap 11 5 - 15    Comment: Performed at [facility], [address] [place], [place] [address]    Current Facility-Administered Medications  Medication Dose Route Frequency Provider Last Rate Last Dose  . docusate (COLACE) 50 MG/5ML liquid 100 mg  100 mg Oral BID Benjaman Kindler, MD   100 mg at 05/06/18 0913  . feeding supplement (ENSURE ENLIVE) (ENSURE ENLIVE) liquid 237 mL  237 mL Oral TID BM Wilhelmina Mcardle, MD   237 mL at 05/08/18 0913  . heparin injection 5,000 Units  5,000 Units Subcutaneous Q8H Wilhelmina Mcardle, MD   5,000 Units at 05/08/18 1404  . HYDROmorphone (DILAUDID) injection 0.5 mg  0.5 mg Intravenous Q4H PRN Wilhelmina Mcardle, MD   0.5 mg at 05/08/18 1452  . lidocaine (XYLOCAINE) 5 % ointment   Topical BID PRN Wilhelmina Mcardle, MD      . Melatonin TABS 5 mg  5 mg Oral QHS PRN Tukov-Yual, Magdalene S, NP   5 mg at 05/08/18 0019  . menthol-cetylpyridinium (CEPACOL) lozenge 3 mg  1 lozenge Oral Q2H PRN Marton Redwood III, MD      . mirtazapine (REMERON) tablet 15 mg  15 mg Oral QHS Clapacs, John T, MD      . nystatin (MYCOSTATIN) 100000 UNIT/ML suspension 500,000 Units  5 mL Oral QID Conforti, John,  DO   500,000 Units at 05/08/18 1403  . ondansetron (ZOFRAN) injection 4 mg  4 mg Intravenous Q6H PRN Marton Redwood III, MD   4 mg at 05/07/18 2052  . opium-belladonna (B&O SUPPRETTES) 16.2-60 MG suppository 1 suppository  1 suppository Rectal Q8H PRN Ceasar Mons, MD   1 suppository at 05/07/18 0400  . phenazopyridine (PYRIDIUM) tablet 200 mg  200 mg Oral Q8H PRN Ceasar Mons, MD   200 mg at 05/08/18 1214  . phenol (CHLORASEPTIC) mouth spray 1 spray  1 spray  Mouth/Throat PRN Wilhelmina Mcardle, MD      . polyethylene glycol (MIRALAX / GLYCOLAX) packet 17 g  17 g Oral Daily Benjaman Kindler, MD   17 g at 05/06/18 0913  . promethazine (PHENERGAN) injection 12.5-25 mg  12.5-25 mg Intravenous Q6H PRN Tukov-Yual, Magdalene S, NP   25 mg at 05/03/18 0000  . sodium chloride flush (NS) 0.9 % injection 10-40 mL  10-40 mL Intracatheter Q12H Awilda Bill, NP   10 mL at 05/08/18 0913  . sodium chloride flush (NS) 0.9 % injection 10-40 mL  10-40 mL Intracatheter PRN Awilda Bill, NP        Musculoskeletal: Strength & Muscle Tone: within normal limits Gait & Station: unsteady Patient leans: N/A  Psychiatric Specialty Exam: Physical Exam  Nursing note and vitals reviewed. Constitutional: She appears well-developed and well-nourished.  HENT:  Head: Normocephalic and atraumatic.  Eyes: Pupils are equal, round, and reactive to light. Conjunctivae are normal.  Neck: Normal range of motion.  Cardiovascular: Regular rhythm and normal heart sounds.  Respiratory: Effort normal. No respiratory distress.  GI: Soft.  Musculoskeletal: Normal range of motion.  Neurological: She is alert.  Skin: Skin is warm and dry.  Psychiatric: Judgment normal. Her mood appears anxious. Her speech is delayed. She is slowed. Thought content is not paranoid. Cognition and memory are normal. She expresses no homicidal and no suicidal ideation.    Review of Systems  Constitutional: Negative.   HENT: Negative.   Eyes: Negative.   Respiratory: Negative.   Cardiovascular: Negative.   Gastrointestinal: Negative.   Musculoskeletal: Negative.   Skin: Negative.   Neurological: Negative.   Psychiatric/Behavioral: Positive for depression. Negative for hallucinations, memory loss, substance abuse and suicidal ideas. The patient is nervous/anxious and has insomnia.     Blood pressure 128/60, pulse (!) 109, temperature (!) 97.5 F (36.4 C), temperature source Oral, resp. rate (!)  21, height '5\' 5"'  (1.651 m), weight 93 kg (205 lb), last menstrual period 04/22/2018, SpO2 99 %.Body mass index is 34.11 kg/m.  General Appearance: Fairly Groomed  Eye Contact:  Fair  Speech:  Slow  Volume:  Decreased  Mood:  Anxious and Dysphoric  Affect:  Constricted  Thought Process:  Goal Directed  Orientation:  Full (Time, Place, and Person)  Thought Content:  Logical  Suicidal Thoughts:  No  Homicidal Thoughts:  No  Memory:  Immediate;   Fair Recent;   Fair Remote;   Fair  Judgement:  Fair  Insight:  Fair  Psychomotor Activity:  Decreased  Concentration:  Concentration: Fair  Recall:  [employer] of Knowledge:  Fair  Language:  Fair  Akathisia:  No  Handed:  Right  AIMS (if indicated):     Assets:  Desire for Improvement Housing Resilience Social Support  ADL's:  Intact  Cognition:  WNL  Sleep:        Treatment Plan Summary: Medication management and Plan  42 year old woman having an adjustment situation with excessive anxiety and depressed mood after medical procedure.  Spoke with the patient and reassured her that this was a normal situation and that she could probably expect that she would be back to feeling normal pretty soon but acknowledged the frustration she is going through.  I suggested that if she like I could add mirtazapine which can help at night with sleep and with her appetite and possibly with her mood.  Patient was agreeable to this.  Orders for 15 mg mirtazapine at night.  Supportive counseling.  We will follow-up as needed.  Disposition: No evidence of imminent risk to self or others at present.   Patient does not meet criteria for psychiatric inpatient admission. Supportive therapy provided about ongoing stressors.  Alethia Berthold, MD 05/08/2018 4:10 PM

## 2018-05-08 NOTE — Progress Notes (Signed)
Inpatient Rehabilitation Admissions Coordinator  I continue to follow pt's progress with therapy. She is not at a level to pursue inpatient acute rehab admit [facility]. I will continue to follow.  Ottie GlazierBarbara Netha Dafoe, RN, MSN Rehab Admissions Coordinator [phone] 05/08/2018 7:40 AM

## 2018-05-08 NOTE — Progress Notes (Signed)
Occupational Therapy Treatment Patient Details Name: [name] MRN: [id number] DOB: 1976/01/13 Today's Date: 05/08/2018    History of present illness Pt is a 42 y/o F s/p laparascopic hysterectomy, Bil aslpingectomy 6/7.  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  Developed hematuria and L hydronephrosis 06/09 requiring cystoscopy and left ureteral stent placement. Renal US 6/10 revealed right hydronephrosis. S/P R percutaneous nephrostomy tube placement 6/10.  NG tube placed 6/10.  Pt with AKI and ileus.  Pt's PMH includes DVT.    OT comments  Pt seen for OT tx this afternoon. Pt fatigued but agreeable to OT session focused on UB bathing. Pt requested to wash and shave underarms and apply deodorant. OT noticed red irritated patches of skin (lateral aspect of LUE distal to underarm and R underarm), spoke to RN who is aware and monitoring. RN requesting no shaving or deodorant at this time. Pt notified and verbalized understanding. With set up assist, pt able to perform UB bathing at bed level (HOB elevated for pt to be positioned in long sitting). Pt fatigues quickly, but with encouragement able to complete washing and drying of arms, chest, and face. OT changed pt gown. Pt progressing slowly but demonstrated improved activity tolerance and participation this date. Pt reports feeling "a little better" today as compared to yesterday. Of note, MD entered to assess pt during OT session. OT stepped out and re-entered to complete OT tx after MD left (3:30pm - 3:43pm, 3:59pm-4:10pm). Will continue to progress.   Follow Up Recommendations  CIR    Equipment Recommendations  Other (comment)(TBD)    Recommendations for Other Services      Precautions / Restrictions Precautions Precautions: Fall Precaution Comments: ureteral drain, R nephrostomy tube, L IJ CVC line Restrictions Weight Bearing  Restrictions: No       Mobility Bed Mobility                  Transfers                      Balance                                           ADL either performed or assessed with clinical judgement   ADL Overall ADL's : Needs assistance/impaired     Grooming: Bed level;Set up Grooming Details (indicate cue type and reason): long sitting in bed with HOB elevated, pt able to perform with set up; pt requested to shave underarms and apply deodorant. Skin appeared red and irritated. RN aware. RN requesting hold on shaving/deodorant for now.  Upper Body Bathing: Bed level;Minimal assistance Upper Body Bathing Details (indicate cue type and reason): long sitting in bed with HOB elevated, pt able to perform a majority of UB bathing with set up, requiring assist for washing her back                                 Vision Patient Visual Report: No change from baseline     Perception     Praxis      Cognition Arousal/Alertness: Awake/alert Behavior During Therapy: WFL for tasks assessed/performed Overall Cognitive Status: Within Functional Limits for tasks assessed  Exercises     Shoulder Instructions       General Comments      Pertinent Vitals/ Pain       Pain Assessment: 0-10 Pain Score: 5  Pain Location: lower back Pain Descriptors / Indicators: Discomfort;Aching Pain Intervention(s): Limited activity within patient's tolerance;Monitored during session;Premedicated before session  Home Living                                          Prior Functioning/Environment              Frequency  Min 3X/week        Progress Toward Goals  OT Goals(current goals can now be found in the care plan section)  Progress towards OT goals: OT to reassess next treatment  Acute Rehab OT Goals Patient Stated Goal: to return to PLOF as quickly as  possible OT Goal Formulation: With patient Time For Goal Achievement: 05/17/18 Potential to Achieve Goals: Good  Plan Discharge plan remains appropriate;Frequency remains appropriate    Co-evaluation                 AM-PAC PT "6 Clicks" Daily Activity     Outcome Measure   Help from another person eating meals?: None Help from another person taking care of personal grooming?: None Help from another person toileting, which includes using toliet, bedpan, or urinal?: A Lot Help from another person bathing (including washing, rinsing, drying)?: A Lot Help from another person to put on and taking off regular upper body clothing?: A Little Help from another person to put on and taking off regular lower body clothing?: A Lot 6 Click Score: 17    End of Session    OT Visit Diagnosis: Other abnormalities of gait and mobility (R26.89);Muscle weakness (generalized) (M62.81)   Activity Tolerance Patient tolerated treatment well   Patient Left in bed;with call bell/phone within reach;with bed alarm set   Nurse Communication          Time: 1530-1610 OT Time Calculation (min): 40 min  Charges: OT General Charges $OT Visit: 1 Visit OT Treatments $Self Care/Home Management : 23-37 mins  Richrd PrimeJamie Stiller, MPH, MS, OTR/L ascom [phone] 05/08/18, 4:17 PM

## 2018-05-08 NOTE — Progress Notes (Signed)
[facility], [place] 05/08/18  Subjective:  Renal function continues to improve.  Creatinine currently down to 1.43. Acceptable urine output noted.  Objective:  Vital signs in last 24 hours:  Temp:  [97.9 F (36.6 C)-99 F (37.2 C)] 97.9 F (36.6 C) (06/18 0405) Pulse Rate:  [101-114] 113 (06/18 0406) Resp:  [18-21] 21 (06/18 0405) BP: (117-132)/(59-65) 117/59 (06/18 0405) SpO2:  [96 %-98 %] 96 % (06/18 0406)  Weight change:  Filed Weights   05/03/18 0800  Weight: 93 kg (205 lb)    Intake/Output:    Intake/Output Summary (Last 24 hours) at 05/08/2018 1214 Last data filed at 05/08/2018 0913 Gross per 24 hour  Intake 210 ml  Output 1200 ml  Net -990 ml     Physical Exam: General: laying in bed  HEENT Moist oral mucus membranes  Neck supple  Pulm/lungs CTAB, normal effort  CVS/Heart S1S2 no rubs  Abdomen:  Mild distension, BS present  Extremities: 2+ b/l LE edema  Neurologic: Alert, oriented x3, follows commands  Skin: No acute rashes   Foley in place, rt nephrostomy       Basic Metabolic Panel:  Recent Labs  Lab 05/05/18 0637 05/05/18 1226 05/05/18 1939 05/06/18 0422 05/07/18 0556 05/08/18 0630  NA 128* 128*  --  130* 130* 129*  K 2.9* 3.1* 3.0* 3.2* 3.7 3.9  CL 89* 88*  --  90* 92* 92*  CO2 24 24  --  24 25 26   GLUCOSE 136* 135*  --  127* 131* 130*  BUN 91* 86*  --  76* 57* 55*  CREATININE 3.74* 3.23*  --  2.01* 1.67* 1.43*  CALCIUM 7.0* 7.0*  --  7.1* 7.5* 8.0*  MG 2.7*  --   --   --  2.2  --   PHOS 5.7*  --   --   --  3.2  --      CBC: Recent Labs  Lab 05/03/18 0158 05/04/18 0447 05/05/18 0637 05/06/18 0422 05/07/18 0556  WBC 14.9* 13.0* 11.9* 14.2* 17.0*  HGB 7.9* 7.7* 7.7* 7.6* 7.7*  HCT 23.4* 22.5* 22.2* 21.5* 22.8*  MCV 89.4 89.2 89.9 89.7 90.3  PLT 135* 171 208 253 332     No results found for: HEPBSAG, HEPBSAB, HEPBIGM    Microbiology:  Recent Results (from the past 240 hour(s))  CULTURE,  BLOOD (ROUTINE X 2) w Reflex to ID Panel     Status: None   Collection Time: 04/28/18  9:24 PM  Result Value Ref Range Status   Specimen Description BLOOD RIGHT HAND  Final   Special Requests   Final    BOTTLES DRAWN AEROBIC AND ANAEROBIC Blood Culture adequate volume   Culture   Final    NO GROWTH 5 DAYS Performed at [facility], [address] [place], [place] [address]    Report Status 05/03/2018 FINAL  Final  CULTURE, BLOOD (ROUTINE X 2) w Reflex to ID Panel     Status: None   Collection Time: 04/28/18  9:44 PM  Result Value Ref Range Status   Specimen Description BLOOD RIGHT ANTECUBITAL  Final   Special Requests   Final    BOTTLES DRAWN AEROBIC AND ANAEROBIC Blood Culture adequate volume   Culture   Final    NO GROWTH 5 DAYS Performed at [facility], [address], [place], [place] [address]    Report Status 05/03/2018 FINAL  Final    Coagulation Studies: No results for input(s):  LABPROT, INR in the last 72 hours.  Urinalysis: No results for input(s): COLORURINE, LABSPEC, PHURINE, GLUCOSEU, HGBUR, BILIRUBINUR, KETONESUR, PROTEINUR, UROBILINOGEN, NITRITE, LEUKOCYTESUR in the last 72 hours.  Invalid input(s): APPERANCEUR    Imaging: No results found.   Medications:    . docusate  100 mg Oral BID  . feeding supplement (ENSURE ENLIVE)  237 mL Oral TID BM  . heparin injection (subcutaneous)  5,000 Units Subcutaneous Q8H  . nystatin  5 mL Oral QID  . polyethylene glycol  17 g Oral Daily  . sodium chloride flush  10-40 mL Intracatheter Q12H   HYDROmorphone (DILAUDID) injection, lidocaine, Melatonin, menthol-cetylpyridinium, [DISCONTINUED] ondansetron **OR** ondansetron (ZOFRAN) IV, opium-belladonna, phenazopyridine, phenol, promethazine, sodium chloride flush  Assessment/ Plan:  42 y.o. caucasian female is post op abdominal hysterectomy, left salpingo-oophorectomy, laparoscopic lysis of adhesions and retroperitoneal dissection on 04/28/2018.   Followed by cystoscopy with retrograde pyelogram and left ureteral stent placement on 04/29/2018 Baseline creatinine is 0.79 from 04/19/2018 Post op case complicated by ARF, b/l  hydronephrosis  1. ARF, likely multifactorial from severe ATN and b/l hydronephrosis likely from compression of ureters from hematoma, post surgical edema. No ureteral injury.  S/p left ureteral stent and right nephrostomy 04/30/18.  -Renal function continues to improve.  BUN/55 with a creatinine of 1.43.  Continue to monitor renal parameters.   2. Generalized edema -Improved over the course of the hospitalization.  Continue to monitor clinically.  3. Hypokalemia -Potassium up to 3.9 post repletion.  4.  Hyponatremia.  Serum sodium remains slightly low at 129.  Repeat serum sodium tomorrow.  5.  Anemia unspecified.  Recommend periodically checking CBC as most recent hemoglobin was 7.7.    LOS: 11 [name], [name] 6/18/201912:14 PM  [facility] [place], [place] [phone]  Note: This note was prepared with Dragon dictation. Any transcription errors are unintentional

## 2018-05-08 NOTE — Progress Notes (Signed)
   05/08/18 1040  Clinical Encounter Type  Visited With Patient and family together  Visit Type Follow-up  Spiritual Encounters  Spiritual Needs Emotional   Chaplain visited with patient during rounds and aware of follow-up recommendation from chaplain yesterday. Patient concerned her progress is slow but says she is doing a little better each day.  Patient says she's less concerned now about her husband and family at home because they seem to be managing well while she's in the hospital. Without any questioning or prompting in this direction, she stated she's not going to give up, and she's a Visual merchandiser"fighter".   If this improvement in mood and coping is maintained, it would be a marked change since previous chaplain's visit yesterday.  Recommendation is that patient needs emotional support to maintain this shaky, but positive attitude.   Patient's adult son arrived to visit and he was concerned about his mother's lack of appetite, asked Chaplain what could be done about that.  Chaplain referred him to medical staff.  Patient states that "nothing tastes good".  Chaplain ended the visit when Physical Therapy arrived but encouraged patient to request on-call chaplain if she would like to talk more.

## 2018-05-09 ENCOUNTER — Inpatient Hospital Stay: Payer: BLUE CROSS/BLUE SHIELD

## 2018-05-09 LAB — CBC
HCT: 21.6 % — ABNORMAL LOW (ref 35.0–47.0)
HEMOGLOBIN: 7.2 g/dL — AB (ref 12.0–16.0)
MCH: 30 pg (ref 26.0–34.0)
MCHC: 33.2 g/dL (ref 32.0–36.0)
MCV: 90.3 fL (ref 80.0–100.0)
PLATELETS: 398 10*3/uL (ref 150–440)
RBC: 2.39 MIL/uL — AB (ref 3.80–5.20)
RDW: 15.2 % — ABNORMAL HIGH (ref 11.5–14.5)
WBC: 22.9 10*3/uL — ABNORMAL HIGH (ref 3.6–11.0)

## 2018-05-09 MED ORDER — MIRABEGRON ER 50 MG PO TB24
50.0000 mg | ORAL_TABLET | Freq: Every day | ORAL | Status: DC
  Administered 2018-05-09 – 2018-05-15 (×7): 50 mg via ORAL
  Filled 2018-05-09 (×9): qty 1

## 2018-05-09 MED ORDER — DOCUSATE SODIUM 100 MG PO CAPS
100.0000 mg | ORAL_CAPSULE | Freq: Two times a day (BID) | ORAL | Status: DC
  Administered 2018-05-09 – 2018-05-19 (×18): 100 mg via ORAL
  Filled 2018-05-09 (×18): qty 1

## 2018-05-09 NOTE — Progress Notes (Signed)
Order received from Dr Dalbert GarnetBeasley to change colace from liquid to capsule

## 2018-05-09 NOTE — Progress Notes (Signed)
Physical Therapy Treatment Patient Details Name: [name] MRN: [id number] DOB: [birth year] Today's Date: 05/09/2018    History of Present Illness Pt is a 42 y/o F s/p laparascopic hysterectomy, Bil aslpingectomy 6/7.  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  Developed hematuria and L hydronephrosis 06/09 requiring cystoscopy and left ureteral stent placement. Renal US 6/10 revealed right hydronephrosis. S/P R percutaneous nephrostomy tube placement 6/10.  NG tube placed 6/10.  Pt with AKI and ileus.  Pt's PMH includes DVT.     PT Comments    Pt agreeable to PT; nursing just finishing giving medication. Pt reports 6/10 pain in lower abdomen (nursing notes MD called due to pain/not feeling right in abdomen"); okay to proceed with PT at this time. Pt does not wish to attempt up/out of bed at this time. Participates in education and limited participation with supine exercises. Pt distressed during limited exercises. Pt given written HEP and encouraged performance throughout the day for light strengthening and LE mobility, but to avoid increasing pain. Pt understands. Continue PT to progress strength, endurance and activity tolerance to improve functional mobility.    Follow Up Recommendations  CIR     Equipment Recommendations       Recommendations for Other Services       Precautions / Restrictions Precautions Precautions: Fall Restrictions Weight Bearing Restrictions: No    Mobility  Bed Mobility               General bed mobility comments: deferred due to pain; pt request  Transfers                    Ambulation/Gait                 Stairs             Wheelchair Mobility    Modified Rankin (Stroke Patients Only)       Balance                                            Cognition Arousal/Alertness:  Awake/alert Behavior During Therapy: WFL for tasks assessed/performed Overall Cognitive Status: Within Functional Limits for tasks assessed                                        Exercises General Exercises - Lower Extremity Ankle Circles/Pumps: AROM;Both;10 reps;Supine Quad Sets: Strengthening;Both;10 reps;Supine Gluteal Sets: Strengthening;Both;10 reps;Supine Short Arc Quad: Other (comment)(reviewed in packet/also LAQ; pt did not wish to do now) Heel Slides: AAROM;Both;5 reps;Supine(very limited range) Hip ABduction/ADduction: AAROM;Both;5 reps;Supine(very limited range) Straight Leg Raises: Other (comment)(reviewed on paper to hold; will be better to start in stand) Other Exercises Other Exercises: Abdominal bracing, attempts, unable to do much or hold. Encouraged continued practice Other Exercises: Provided written HEP    General Comments        Pertinent Vitals/Pain Pain Assessment: 0-10 Pain Score: 6  Pain Location: Low abdomen Pain Descriptors / Indicators: Constant;Grimacing;Guarding;Other (Comment)("I am hurting right now"; near tearful) Pain Intervention(s): Limited activity within patient's tolerance    Home Living                      Prior Function  PT Goals (current goals can now be found in the care plan section) Progress towards PT goals: Not progressing toward goals - comment(unable to attempt mobility at mobility at this time)    Frequency    7X/week      PT Plan Current plan remains appropriate    Co-evaluation              AM-PAC PT "6 Clicks" Daily Activity  Outcome Measure  Difficulty turning over in bed (including adjusting bedclothes, sheets and blankets)?: Unable Difficulty moving from lying on back to sitting on the side of the bed? : Unable Difficulty sitting down on and standing up from a chair with arms (e.g., wheelchair, bedside commode, etc,.)?: Unable Help needed moving to and from a bed  to chair (including a wheelchair)?: Total Help needed walking in hospital room?: Total Help needed climbing 3-5 steps with a railing? : Total 6 Click Score: 6    End of Session   Activity Tolerance: Patient limited by pain Patient left: in bed;with call bell/phone within reach;with bed alarm set;with family/visitor present Nurse Communication: Other (comment)(Nurse notes MD called due to pain/not feeling well) PT Visit Diagnosis: Muscle weakness (generalized) (M62.81);Difficulty in walking, not elsewhere classified (R26.2);Other abnormalities of gait and mobility (R26.89)     Time: [id number] PT Time Calculation (min) (ACUTE ONLY): 14 min  Charges:  $Therapeutic Exercise: 8-22 mins                    G Codes:        Scot DockHeidi E Barnes, PTA 05/09/2018, 10:26 AM

## 2018-05-09 NOTE — Progress Notes (Signed)
Dr Dalbert GarnetBeasley informed of am labs

## 2018-05-09 NOTE — Progress Notes (Signed)
Patient is having discomfort in her lower abdomen.  I convinced her to let me do a bladder scan which did not show any urine but it is difficult to place the scanner at the correct place due to her surgical dressing.  I irrigated the foley and got back several small clots.  I continued to irrigate until irrigant was able to be withdrawn and no blood clots returned

## 2018-05-09 NOTE — Progress Notes (Signed)
Urology Consult Follow Up  Subjective: Frustrated and tearful today, complaining of bladder spasms.  Difficulty tolerating bladder irrigation.  Irrigated from few small clots, no significant clot burden.  Anti-infectives: Anti-infectives (From admission, onward)   Start     Dose/Rate Route Frequency Ordered Stop   05/06/18 2200  meropenem (MERREM) 1 g in sodium chloride 0.9 % 100 mL IVPB  Status:  Discontinued     1 g 200 mL/hr over 30 Minutes Intravenous Every 12 hours 05/06/18 1305 05/07/18 0845   05/02/18 1000  meropenem (MERREM) 500 mg in sodium chloride 0.9 % 100 mL IVPB  Status:  Discontinued     500 mg 200 mL/hr over 30 Minutes Intravenous Every 12 hours 05/02/18 0911 05/06/18 1305   05/01/18 2200  piperacillin-tazobactam (ZOSYN) IVPB 3.375 g  Status:  Discontinued     3.375 g 12.5 mL/hr over 240 Minutes Intravenous Every 12 hours 05/01/18 1952 05/02/18 0838   04/30/18 2200  piperacillin-tazobactam (ZOSYN) IVPB 3.375 g  Status:  Discontinued     3.375 g 12.5 mL/hr over 240 Minutes Intravenous Every 12 hours 04/30/18 0833 05/01/18 1509   04/29/18 0330  piperacillin-tazobactam (ZOSYN) IVPB 3.375 g  Status:  Discontinued     3.375 g 12.5 mL/hr over 240 Minutes Intravenous Every 8 hours 04/29/18 0317 04/30/18 0833   04/27/18 0615  ceFAZolin (ANCEF) 2-4 GM/100ML-% IVPB    Note to Pharmacy:  Lorrene ReidJackson, Pamela   : cabinet override      04/27/18 0615 04/27/18 0746   04/27/18 0600  ceFAZolin (ANCEF) IVPB 2g/100 mL premix     2 g 200 mL/hr over 30 Minutes Intravenous On call to O.R. 04/26/18 2240 04/27/18 1142      Current Facility-Administered Medications  Medication Dose Route Frequency Provider Last Rate Last Dose  . docusate (COLACE) 50 MG/5ML liquid 100 mg  100 mg Oral BID Christeen DouglasBeasley, Bethany, MD   100 mg at 05/06/18 0913  . feeding supplement (ENSURE ENLIVE) (ENSURE ENLIVE) liquid 237 mL  237 mL Oral TID BM Merwyn KatosSimonds, David B, MD   237 mL at 05/09/18 1001  . heparin injection 5,000  Units  5,000 Units Subcutaneous Q8H Merwyn KatosSimonds, David B, MD   5,000 Units at 05/09/18 1314  . HYDROmorphone (DILAUDID) injection 0.5 mg  0.5 mg Intravenous Q4H PRN Merwyn KatosSimonds, David B, MD   0.5 mg at 05/09/18 1516  . lidocaine (XYLOCAINE) 5 % ointment   Topical BID PRN Merwyn KatosSimonds, David B, MD      . Melatonin TABS 5 mg  5 mg Oral QHS PRN Tukov-Yual, Magdalene S, NP   5 mg at 05/08/18 0019  . menthol-cetylpyridinium (CEPACOL) lozenge 3 mg  1 lozenge Oral Q2H PRN Ray ChurchBell, Eugene D III, MD      . mirtazapine (REMERON) tablet 15 mg  15 mg Oral QHS Clapacs, Jackquline DenmarkJohn T, MD   15 mg at 05/08/18 2208  . nystatin (MYCOSTATIN) 100000 UNIT/ML suspension 500,000 Units  5 mL Oral QID Conforti, John, DO   500,000 Units at 05/09/18 1314  . ondansetron (ZOFRAN) injection 4 mg  4 mg Intravenous Q6H PRN Ray ChurchBell, Eugene D III, MD   4 mg at 05/07/18 2052  . opium-belladonna (B&O SUPPRETTES) 16.2-60 MG suppository 1 suppository  1 suppository Rectal Q8H PRN Rene PaciWinter, Christopher Aaron, MD   1 suppository at 05/08/18 2208  . phenazopyridine (PYRIDIUM) tablet 200 mg  200 mg Oral Q8H PRN Rene PaciWinter, Christopher Aaron, MD   200 mg at 05/09/18 1316  . phenol (CHLORASEPTIC) mouth  spray 1 spray  1 spray Mouth/Throat PRN Merwyn Katos, MD      . polyethylene glycol (MIRALAX / GLYCOLAX) packet 17 g  17 g Oral Daily Christeen Douglas, MD   17 g at 05/09/18 1002  . promethazine (PHENERGAN) injection 12.5-25 mg  12.5-25 mg Intravenous Q6H PRN Tukov-Yual, Magdalene S, NP   25 mg at 05/03/18 0000  . sodium chloride flush (NS) 0.9 % injection 10-40 mL  10-40 mL Intracatheter Q12H Eugenie Norrie, NP   10 mL at 05/09/18 1002  . sodium chloride flush (NS) 0.9 % injection 10-40 mL  10-40 mL Intracatheter PRN Eugenie Norrie, NP         Objective: Vital signs in last 24 hours: Temp:  [97.5 F (36.4 C)-98.2 F (36.8 C)] 97.5 F (36.4 C) (06/19 1242) Pulse Rate:  [102-121] 114 (06/19 1242) Resp:  [18-20] 18 (06/19 1242) BP: (118-132)/(62-69) 118/62  (06/19 1242) SpO2:  [95 %-99 %] 97 % (06/19 1242)  Intake/Output from previous day: 06/18 0701 - 06/19 0700 In: 480 [P.O.:420; I.V.:30] Out: 1950 [Urine:1950] Intake/Output this shift: Total I/O In: 120 [P.O.:120] Out: 611 [Urine:610; Stool:1]   Physical Exam  Awake, alert and oriented today Abdomen soft, nondistended.  Lower abdominal incision healing well. Foley in place cranberry colored urine without obvious clots Right nephrostomy tube draining very scant clot upon irrigation, unable to tolerate radiation well due to bladder spasms which were palpable within the syringe Right nephrostomy draining orange tinged urine. Improving lower extremity anasarca   Lab Results:  Recent Labs    05/07/18 0556 05/09/18 1300  WBC 17.0* 22.9*  HGB 7.7* 7.2*  HCT 22.8* 21.6*  PLT 332 398   BMET Recent Labs    05/07/18 0556 05/08/18 0630  NA 130* 129*  K 3.7 3.9  CL 92* 92*  CO2 25 26  GLUCOSE 131* 130*  BUN 57* 55*  CREATININE 1.67* 1.43*  CALCIUM 7.5* 8.0*    Studies/Results: No results found. Renal ultrasound personally reviewed.  Assessment/ Plan:  1.  Gross hematuria/ bladder pain-   hematuria appears to be minimal with scant clot.  Given that she is having difficulty tolerating irrigation, will obtain formal pelvic ultrasound to ensure that there is no significant clot burden although not suspected.  In addition to belladonna and opium suppositories, we will go ahead and add Myrbetriq 50 mg as well for bladder spasms.  Prefer to avoid additional anticholinergics given recent fairly significant ileus.  2.  Acute kidney injury-Improving.  UOP excellent  3. Foley/ nephrostomy /stent-ideally, would like to start removing tubes.  Will order antegrade nephrostogram on right, if drains quickly to bladder, will clamp for 24 hours prior to removal.  Okay to DC Foley once able to ambulate to toilet.  Will likely arrange for cystoscopy, stent removal as an outpatient.    LOS:  12 days    Vanna Scotland 05/09/2018

## 2018-05-09 NOTE — Progress Notes (Signed)
Patient ID: [name], female   DOB: [birth year], 42 y.o.   MRN: [id number] 11 Days Post-Op  Procedure(s):  1. 04/28/18: ATTEMPTED LAPAROSCOPIC HYSTERECTOMY CONVERTED TO SUPRACERVICAL ABDOMINAL HYSTERECTOMY (N/A) SALPINGO OOPHORECTOMY (Left) CYSTOSCOPY (N/A) UNILATERAL SALPINGECTOMY (Right) LAPAROSCOPIC LYSIS OF ADHESIONS, RETROPERITONEAL DISSECTION  2. 04/29/18: CYSTOSCOPY WITH RETROGRADE PYELOGRAM/Left URETERAL STENT PLACEMENT, ureteroscopy (Bilateral)    Subjective:  S/p ICU, renal function recovering, still with foley in place. Being followed by psych and pastoral care for mood support. Followed by nephrology and urology, as well as physical therapy.  44 F with PMH of GERD, DVT, and DUB, underwenttotal hysterectomy and bilateral laparoscopic salpingectomy 06/07. 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 Developed hematuria and L hydronephrosis 06/09 requiring cystoscopy and left ureteral stent placement. Renal US 6/10 revealed right hydronephrosis. S/P R percutaneous nephrostomy tube placement 6/10.  Now s/p NG tube, Foley in place, left JV arterial line S/p incisional dressing UOP improved. Remains weak. Swelling improved, difficulty with PT.  Feeling worsening pain in b/l lower abdomen, relates it to the Foley, but feels new pain to patient. Urology added Myrbetriq, belladonna and opium suppositories. Dilaudid helps.  She is mildly tachycardic again, and she feels her "taste buds aren't working." c/o no appetite.  Objective: Vital signs in last 24 hours: Temp:  [97.5 F (36.4 C)-98.2 F (36.8 C)] 97.5 F (36.4 C) (06/19 1242) Pulse Rate:  [102-121] 114 (06/19 1242) Resp:  [18-20] 18 (06/19 1242) BP: (118-132)/(62-69) 118/62 (06/19 1242) SpO2:  [95 %-99 %] 97  % (06/19 1242)  Intake/Output  Intake/Output Summary (Last 24 hours) at 05/09/2018 1642 Last data filed at 05/09/2018 1258 Gross per 24 hour  Intake 300 ml  Output 1836 ml  Net -1536 ml    Physical Exam:  General: Oriented and responsive, appears anxious but no longer ill CV: Regular rhythm, no murmurs, increased rate Lungs: Decreased breath sounds in bilateral bases, sounds wet, L>R GI: Non-tender, non-distended. Normal bowel sounds. Ecchymosis over mons almost resolved, and lap incisions clean and dry, steris in place.  Incisions: Clean and dry Urine: Pink, Foley in place Extremities: Nontender, no erythema, improving edema in lower extremities Fingers, hands and arms are no longer edematous.  Lab Results: Recent Labs    05/07/18 0556 05/09/18 1300  HGB 7.7* 7.2*  HCT 22.8* 21.6*  WBC 17.0* 22.9*  PLT 332 398                 Results for orders placed or performed during the hospital encounter of 04/27/18 (from the past 24 hour(s))  CBC     Status: Abnormal   Collection Time: 05/09/18  1:00 PM  Result Value Ref Range   WBC 22.9 (H) 3.6 - 11.0 K/uL   RBC 2.39 (L) 3.80 - 5.20 MIL/uL   Hemoglobin 7.2 (L) 12.0 - 16.0 g/dL   HCT 40.9 (L) 81.1 - 91.4 %   MCV 90.3 80.0 - 100.0 fL   MCH 30.0 26.0 - 34.0 pg   MCHC 33.2 32.0 - 36.0 g/dL   RDW 78.2 (H) 95.6 - 21.3 %   Platelets 398 150 - 440 K/uL    Assessment/Plan: 11 Days Post-Op        Procedure(s): 1. 04/28/18: ATTEMPTED LAPAROSCOPIC HYSTERECTOMY CONVERTED TO SUPRACERVICAL ABDOMINAL HYSTERECTOMY (N/A) SALPINGO  OOPHORECTOMY (Left) CYSTOSCOPY (N/A) UNILATERAL SALPINGECTOMY (Right) LAPAROSCOPIC LYSIS OF ADHESIONS, RETROPERITONEAL DISSECTION   2. 04/29/18: CYSTOSCOPY WITH RETROGRADE PYELOGRAM/Left URETERAL STENT PLACEMENT, ureteroscopy (Bilateral)  IMP: S/P hysterectomy 06/07 with intra-abdominal bleeding complications Status post massive transfusion protocol Acute blood loss anemia,  Anxiety/situational  depression Persistent hematuria, much improved Left hydronephrosis - Status post left ureteral stent Right hydronephrosis - Status post percutaneous nephrostomy tube AKI, resolving.   Hypervolemia/anasarca, resolved Ileus with nausea, resolved Mildly elevated PCT without clear infectious process Generalized edema- lower extremities, resolving Deconditioning  PLAN/REC:  Increased WBC today without focal infection: s/p merepenem in ICU. Will get chest xray, repeat urine culture and blood culture from line  Decreased hgb: likely 2/2 hematuria. Consider transfusion if <7.0, though low threshold now to transfuse if nephrology agreeable.  Hyponatremia: Consult to hospitalist for repletion of electrolytes, monitoring deconditioning  Appreciate psych consult and recommendations Appreciate pastoral care  Appreciate nephro recs  Continue physical therapy. Very deconditioned. Case management planning for possible inpatient rehab, though may not require. Regular diet    Amy DouglasBethany Aeliana Spates, MD   LOS: 12 days   [name] 05/09/2018, 4:42 PMPatient ID: Amy SidleGinger B [name], female   DOB: 11/12/1976, 42 y.o.   MRN: [id number]

## 2018-05-09 NOTE — Progress Notes (Signed)
Patient ID: Amy SidleGinger B Weinreb, female   DOB: 12/22/1975, 42 y.o.   MRN: [id number] D/w Dr Dalbert GarnetBeasley after reviewing chart. For now cancel IM consult.

## 2018-05-09 NOTE — Progress Notes (Signed)
[facility], [place] 05/09/18  Subjective:  No new renal function testing today. Last creatinine was 1.43. Good urine output noted however.  Objective:  Vital signs in last 24 hours:  Temp:  [97.5 F (36.4 C)-98.2 F (36.8 C)] 98.2 F (36.8 C) (06/19 0600) Pulse Rate:  [102-121] 121 (06/19 0959) Resp:  [18-20] 20 (06/19 0600) BP: (121-132)/(60-69) 132/69 (06/19 0600) SpO2:  [95 %-99 %] 95 % (06/19 0959)  Weight change:  Filed Weights   05/03/18 0800  Weight: 93 kg (205 lb)    Intake/Output:    Intake/Output Summary (Last 24 hours) at 05/09/2018 1235 Last data filed at 05/09/2018 1045 Gross per 24 hour  Intake 450 ml  Output 1736 ml  Net -1286 ml     Physical Exam: General: laying in bed  HEENT Moist oral mucus membranes  Neck supple  Pulm/lungs CTAB, normal effort  CVS/Heart S1S2 no rubs  Abdomen:  Mild distension, BS present  Extremities: 3+ b/l LE edema  Neurologic: Alert, oriented x3, follows commands  Skin: No acute rashes   Foley in place, rt nephrostomy       Basic Metabolic Panel:  Recent Labs  Lab 05/05/18 0637 05/05/18 1226 05/05/18 1939 05/06/18 0422 05/07/18 0556 05/08/18 0630  NA 128* 128*  --  130* 130* 129*  K 2.9* 3.1* 3.0* 3.2* 3.7 3.9  CL 89* 88*  --  90* 92* 92*  CO2 24 24  --  24 25 26   GLUCOSE 136* 135*  --  127* 131* 130*  BUN 91* 86*  --  76* 57* 55*  CREATININE 3.74* 3.23*  --  2.01* 1.67* 1.43*  CALCIUM 7.0* 7.0*  --  7.1* 7.5* 8.0*  MG 2.7*  --   --   --  2.2  --   PHOS 5.7*  --   --   --  3.2  --      CBC: Recent Labs  Lab 05/03/18 0158 05/04/18 0447 05/05/18 0637 05/06/18 0422 05/07/18 0556  WBC 14.9* 13.0* 11.9* 14.2* 17.0*  HGB 7.9* 7.7* 7.7* 7.6* 7.7*  HCT 23.4* 22.5* 22.2* 21.5* 22.8*  MCV 89.4 89.2 89.9 89.7 90.3  PLT 135* 171 208 253 332     No results found for: HEPBSAG, HEPBSAB, HEPBIGM    Microbiology:  No results found for this or any previous visit (from the  past 240 hour(s)).  Coagulation Studies: No results for input(s): LABPROT, INR in the last 72 hours.  Urinalysis: No results for input(s): COLORURINE, LABSPEC, PHURINE, GLUCOSEU, HGBUR, BILIRUBINUR, KETONESUR, PROTEINUR, UROBILINOGEN, NITRITE, LEUKOCYTESUR in the last 72 hours.  Invalid input(s): APPERANCEUR    Imaging: No results found.   Medications:    . docusate  100 mg Oral BID  . feeding supplement (ENSURE ENLIVE)  237 mL Oral TID BM  . heparin injection (subcutaneous)  5,000 Units Subcutaneous Q8H  . mirtazapine  15 mg Oral QHS  . nystatin  5 mL Oral QID  . polyethylene glycol  17 g Oral Daily  . sodium chloride flush  10-40 mL Intracatheter Q12H   HYDROmorphone (DILAUDID) injection, lidocaine, Melatonin, menthol-cetylpyridinium, [DISCONTINUED] ondansetron **OR** ondansetron (ZOFRAN) IV, opium-belladonna, phenazopyridine, phenol, promethazine, sodium chloride flush  Assessment/ Plan:  42 y.o. caucasian female is post op abdominal hysterectomy, left salpingo-oophorectomy, laparoscopic lysis of adhesions and retroperitoneal dissection on 04/28/2018.  Followed by cystoscopy with retrograde pyelogram and left ureteral stent placement on 04/29/2018 Baseline creatinine is 0.79 from 04/19/2018 Post op case complicated by ARF,  b/l  hydronephrosis  1. ARF, likely multifactorial from severe ATN and b/l hydronephrosis likely from compression of ureters from hematoma, post surgical edema. No ureteral injury.  S/p left ureteral stent and right nephrostomy 04/30/18.  -No new renal function testing available today.  Recommend continued monitoring of renal function as this was improving.  Urine output was 1.9 L over the preceding 24 hours.  2. Generalized edema -Recommend avoiding diuretics now given renal recovery.  3. Hypokalemia -Recheck serum potassium today.  4.  Hyponatremia.  Recheck serum sodium today.  5.  Anemia unspecified.  She continues to have some hematuria therefore her  hemoglobin could drop a bit further.  Recheck ABC today.   LOS: 12 Mady HaagensenLATEEF, Kaci Freel 6/19/201912:35 PM  [facility] [place], [place] [phone]  Note: This note was prepared with Dragon dictation. Any transcription errors are unintentional

## 2018-05-09 NOTE — Progress Notes (Signed)
Dr Dalbert GarnetBeasley and Dr Apolinar JunesBrandon notified of patients request to be seen.  She said she feels like something is not right in her abdomen/ peri area.  I informed them of blood urine and blood clots when irrigated.  Dr Dalbert GarnetBeasley said to remove honeycomb dressing on abdomen and leave it off and try to get another bladder scan

## 2018-05-10 ENCOUNTER — Inpatient Hospital Stay: Payer: BLUE CROSS/BLUE SHIELD

## 2018-05-10 ENCOUNTER — Encounter: Payer: Self-pay | Admitting: Interventional Radiology

## 2018-05-10 HISTORY — PX: IR NEPHROSTOGRAM RIGHT THRU EXISTING ACCESS: IMG6062

## 2018-05-10 LAB — BLOOD CULTURE ID PANEL (REFLEXED)
ACINETOBACTER BAUMANNII: NOT DETECTED
CANDIDA PARAPSILOSIS: NOT DETECTED
CANDIDA TROPICALIS: NOT DETECTED
Candida albicans: NOT DETECTED
Candida glabrata: NOT DETECTED
Candida krusei: NOT DETECTED
Enterobacter cloacae complex: NOT DETECTED
Enterobacteriaceae species: NOT DETECTED
Enterococcus species: NOT DETECTED
Escherichia coli: NOT DETECTED
HAEMOPHILUS INFLUENZAE: NOT DETECTED
KLEBSIELLA OXYTOCA: NOT DETECTED
KLEBSIELLA PNEUMONIAE: NOT DETECTED
Listeria monocytogenes: NOT DETECTED
METHICILLIN RESISTANCE: NOT DETECTED
Neisseria meningitidis: NOT DETECTED
Proteus species: NOT DETECTED
Pseudomonas aeruginosa: NOT DETECTED
SERRATIA MARCESCENS: NOT DETECTED
STAPHYLOCOCCUS AUREUS BCID: NOT DETECTED
Staphylococcus species: DETECTED — AB
Streptococcus agalactiae: NOT DETECTED
Streptococcus pneumoniae: NOT DETECTED
Streptococcus pyogenes: NOT DETECTED
Streptococcus species: NOT DETECTED

## 2018-05-10 LAB — CBC
HEMATOCRIT: 20.5 % — AB (ref 35.0–47.0)
HEMOGLOBIN: 7 g/dL — AB (ref 12.0–16.0)
MCH: 30.8 pg (ref 26.0–34.0)
MCHC: 34.3 g/dL (ref 32.0–36.0)
MCV: 89.8 fL (ref 80.0–100.0)
Platelets: 370 10*3/uL (ref 150–440)
RBC: 2.28 MIL/uL — AB (ref 3.80–5.20)
RDW: 14.8 % — ABNORMAL HIGH (ref 11.5–14.5)
WBC: 20.4 10*3/uL — AB (ref 3.6–11.0)

## 2018-05-10 LAB — URINALYSIS, ROUTINE W REFLEX MICROSCOPIC
BACTERIA UA: NONE SEEN
RBC / HPF: >50 RBC/hpf — ABNORMAL HIGH (ref 0–5)
SQUAMOUS EPITHELIAL / LPF: NONE SEEN (ref 0–5)
Specific Gravity, Urine: 1.02 (ref 1.005–1.030)
WBC, UA: >50 WBC/hpf — ABNORMAL HIGH (ref 0–5)

## 2018-05-10 LAB — BASIC METABOLIC PANEL
ANION GAP: 13 (ref 5–15)
BUN: 44 mg/dL — ABNORMAL HIGH (ref 6–20)
CHLORIDE: 91 mmol/L — AB (ref 101–111)
CO2: 24 mmol/L (ref 22–32)
Calcium: 7.9 mg/dL — ABNORMAL LOW (ref 8.9–10.3)
Creatinine, Ser: 1.14 mg/dL — ABNORMAL HIGH (ref 0.44–1.00)
GFR calc Af Amer: >60 mL/min (ref >60)
GFR, EST NON AFRICAN AMERICAN: 58 mL/min — AB (ref >60)
Glucose, Bld: 119 mg/dL — ABNORMAL HIGH (ref 65–99)
POTASSIUM: 4.2 mmol/L (ref 3.5–5.1)
Sodium: 128 mmol/L — ABNORMAL LOW (ref 135–145)

## 2018-05-10 LAB — URINE CULTURE
Culture: NO GROWTH
Special Requests: NORMAL

## 2018-05-10 LAB — PREPARE RBC (CROSSMATCH)

## 2018-05-10 LAB — VITAMIN B12: VITAMIN B 12: 277 pg/mL (ref 180–914)

## 2018-05-10 MED ORDER — ACETAMINOPHEN 325 MG PO TABS
650.0000 mg | ORAL_TABLET | Freq: Once | ORAL | Status: AC
  Administered 2018-05-10: 650 mg via ORAL
  Filled 2018-05-10: qty 2

## 2018-05-10 MED ORDER — DIPHENHYDRAMINE HCL 25 MG PO CAPS
25.0000 mg | ORAL_CAPSULE | Freq: Once | ORAL | Status: AC
  Administered 2018-05-10: 25 mg via ORAL
  Filled 2018-05-10: qty 1

## 2018-05-10 MED ORDER — OXYCODONE-ACETAMINOPHEN 5-325 MG PO TABS
1.0000 | ORAL_TABLET | Freq: Four times a day (QID) | ORAL | Status: DC | PRN
  Administered 2018-05-10 – 2018-05-11 (×3): 1 via ORAL
  Administered 2018-05-11 – 2018-05-12 (×3): 2 via ORAL
  Administered 2018-05-12 – 2018-05-13 (×2): 1 via ORAL
  Administered 2018-05-13 – 2018-05-15 (×6): 2 via ORAL
  Administered 2018-05-15: 1 via ORAL
  Administered 2018-05-15 – 2018-05-19 (×11): 2 via ORAL
  Filled 2018-05-10: qty 1
  Filled 2018-05-10 (×2): qty 2
  Filled 2018-05-10: qty 1
  Filled 2018-05-10 (×5): qty 2
  Filled 2018-05-10: qty 1
  Filled 2018-05-10 (×7): qty 2
  Filled 2018-05-10 (×2): qty 1
  Filled 2018-05-10 (×9): qty 2

## 2018-05-10 MED ORDER — FUROSEMIDE 10 MG/ML IJ SOLN
20.0000 mg | Freq: Once | INTRAMUSCULAR | Status: AC
  Administered 2018-05-11: 20 mg via INTRAVENOUS
  Filled 2018-05-10: qty 4

## 2018-05-10 MED ORDER — IOPAMIDOL (ISOVUE-300) INJECTION 61%
10.0000 mL | Freq: Once | INTRAVENOUS | Status: AC | PRN
  Administered 2018-05-10: 10 mL via INTRAVENOUS

## 2018-05-10 MED ORDER — ALPRAZOLAM 1 MG PO TABS
1.0000 mg | ORAL_TABLET | Freq: Three times a day (TID) | ORAL | Status: DC | PRN
  Administered 2018-05-10 – 2018-05-18 (×12): 1 mg via ORAL
  Filled 2018-05-10 (×12): qty 1

## 2018-05-10 MED ORDER — CEFAZOLIN SODIUM-DEXTROSE 2-4 GM/100ML-% IV SOLN
2.0000 g | Freq: Three times a day (TID) | INTRAVENOUS | Status: DC
  Administered 2018-05-10 – 2018-05-15 (×15): 2 g via INTRAVENOUS
  Filled 2018-05-10 (×18): qty 100

## 2018-05-10 MED ORDER — FUROSEMIDE 10 MG/ML IJ SOLN
20.0000 mg | Freq: Once | INTRAMUSCULAR | Status: AC
  Administered 2018-05-10: 20 mg via INTRAVENOUS

## 2018-05-10 MED ORDER — SODIUM CHLORIDE 0.9% IV SOLUTION
Freq: Once | INTRAVENOUS | Status: AC
  Administered 2018-05-10: via INTRAVENOUS

## 2018-05-10 NOTE — Progress Notes (Signed)
Pt transferred to chair. Stand and pivot with two assist.

## 2018-05-10 NOTE — Progress Notes (Signed)
Patient ID: [name], female   DOB: 11/29/1975, 42 y.o.   MRN: [id number] 12 Days Post-Op  Procedure(s):  1. 04/28/18: ATTEMPTED LAPAROSCOPIC HYSTERECTOMY CONVERTED TO SUPRACERVICAL ABDOMINAL HYSTERECTOMY (N/A) SALPINGO OOPHORECTOMY (Left) CYSTOSCOPY (N/A) UNILATERAL SALPINGECTOMY (Right) LAPAROSCOPIC LYSIS OF ADHESIONS, RETROPERITONEAL DISSECTION  2. 04/29/18: CYSTOSCOPY WITH RETROGRADE PYELOGRAM/Left URETERAL STENT PLACEMENT, ureteroscopy (Bilateral)    Subjective:  Tolerating regular diet.  Foley in place with dark red urine. Overnight 200ml out recorded over 13 hrs (9:45pm to 10:15am) with 300ml out at 10:15 this morning and approx 200 in bag now. Possible misdocumentation of output. Did get foley flushed last night.  Working with PT and OT, but hgb so low that OT limited working today. This is her main issue now. Lower edema significant, and limiting her activity. She is also fearful of bladder pain (mybitriq not working as well as she would like).   Hgb dropped to 7.0 today. WBC rose yesterday and stable today. CXR no pneumonia, clear except for Persistent RIGHT basilar atelectasis.  Pelvic ultrasound today did not visualize bladder   Some depression, normal reaction to medical complications. Family at bedside, supportive Feeling tired, anxious and drowsy.  Objective: Vital signs in last 24 hours: Temp:  [98.2 F (36.8 C)-98.4 F (36.9 C)] 98.4 F (36.9 C) (06/20 40980608) Pulse Rate:  [102-105] 102 (06/20 0608) Resp:  [18-20] 18 (06/20 0608) BP: (122-123)/(63-65) 122/63 (06/20 11910608) SpO2:  [95 %-97 %] 95 % (06/20 47820608)  Intake/Output  Intake/Output Summary (Last 24 hours) at 05/10/2018 1302 Last data filed at 05/10/2018 1022 Gross per 24 hour  Intake 250 ml  Output 1450 ml  Net -1200 ml    Physical Exam:  General: Oriented and responsive, appears anxious but no longer ill CV: Regular rhythm, no murmurs, regular rate Lungs: Decreased breath sounds in bilateral  bases, GI: Non-tender, non-distended. Normal bowel sounds. Ecchymosis over mons improving, and lap incisions clean and dry.  Incisions: Clean and dry Urine: Pink, Foley in place Extremities: Nontender, no erythema, improving edema in lower extremities Fingers, hands and arms are no longer edematous.  Lab Results: Recent Labs    05/09/18 1300 05/10/18 0452  HGB 7.2* 7.0*  HCT 21.6* 20.5*  WBC 22.9* 20.4*  PLT 398 370                 Results for orders placed or performed during the hospital encounter of 04/27/18 (from the past 24 hour(s))  CULTURE, BLOOD (ROUTINE X 2) w Reflex to ID Panel     Status: None (Preliminary result)   Collection Time: 05/09/18  6:02 PM  Result Value Ref Range   Specimen Description BLOOD LEFT HAND    Special Requests      BOTTLES DRAWN AEROBIC AND ANAEROBIC Blood Culture adequate volume   Culture      NO GROWTH < 12 HOURS Performed at [facility], [address] [place], [place] [address]    Report Status PENDING   CULTURE, BLOOD (ROUTINE X 2) w Reflex to ID Panel     Status: None (Preliminary result)   Collection Time: 05/09/18  7:00 PM  Result Value Ref Range   Specimen Description BLOOD RIGHT HAND    Special Requests      BOTTLES DRAWN AEROBIC AND ANAEROBIC Blood Culture adequate volume   Culture      NO GROWTH < 12 HOURS Performed at [facility], [address] [place], [place] [address]    Report Status PENDING  Basic metabolic panel     Status: Abnormal   Collection Time: 05/10/18  4:52 AM  Result Value Ref Range   Sodium 128 (L) 135 - 145 mmol/L   Potassium 4.2 3.5 - 5.1 mmol/L   Chloride 91 (L) 101 - 111 mmol/L   CO2 24 22 - 32 mmol/L   Glucose, Bld 119 (H) 65 - 99 mg/dL   BUN 44 (H) 6 - 20 mg/dL   Creatinine, Ser 1.61 (H) 0.44 - 1.00 mg/dL   Calcium 7.9 (L) 8.9 - 10.3 mg/dL   GFR calc non Af Amer 58 (L) >60 mL/min   GFR calc Af Amer >60 >60 mL/min   Anion gap 13 5 - 15  CBC     Status: Abnormal    Collection Time: 05/10/18  4:52 AM  Result Value Ref Range   WBC 20.4 (H) 3.6 - 11.0 K/uL   RBC 2.28 (L) 3.80 - 5.20 MIL/uL   Hemoglobin 7.0 (L) 12.0 - 16.0 g/dL   HCT 09.6 (L) 04.5 - 40.9 %   MCV 89.8 80.0 - 100.0 fL   MCH 30.8 26.0 - 34.0 pg   MCHC 34.3 32.0 - 36.0 g/dL   RDW 81.1 (H) 91.4 - 78.2 %   Platelets 370 150 - 440 K/uL  Vitamin B12     Status: None   Collection Time: 05/10/18  5:00 AM  Result Value Ref Range   Vitamin B-12 277 180 - 914 pg/mL    Assessment/Plan: 12 Days Post-Op        Procedure(s): 1. 04/28/18: ATTEMPTED LAPAROSCOPIC HYSTERECTOMY CONVERTED TO SUPRACERVICAL ABDOMINAL HYSTERECTOMY (N/A) SALPINGO OOPHORECTOMY (Left) CYSTOSCOPY (N/A) UNILATERAL SALPINGECTOMY (Right) LAPAROSCOPIC LYSIS OF ADHESIONS, RETROPERITONEAL DISSECTION   2. 04/29/18: CYSTOSCOPY WITH RETROGRADE PYELOGRAM/Left URETERAL STENT PLACEMENT, ureteroscopy (Bilateral)  IMP: S/P hysterectomy 06/07 with intra-abdominal bleeding complications Status post massive transfusion protocol Anxiety/situational depression Persistent hematuria, much improved Left hydronephrosis - Status post left ureteral stent Right hydronephrosis - Status post percutaneous nephrostomy tube AKI, resolving.   Hypervolemia/anasarca, resolved Acute blood loss anemia, not actively bleeding Ileus with nausea, resolved Mildly elevated WCB without clear infectious process Generalized edema- lower extremities  PLAN/REC: - Foley still with hematuria. 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 Anticoagulation+ foley bulb may be leading to the hematuria. Appreciate urology. - Hgb dropped to 7.0. Will give 2 unit pRBCs with lasix and premeds. Appreciate nephrology following. - Still has left central line. After blood transfusion, will consider pulling. - WBC stable, no evidence of pneumonia. Blood culture and urine culture pending.  Main issue will be  activity and dispo planning. Encouraged patient to actively work with PT/OT for ambulation. She is anxious to go home, and after urology clears cath removal, the only thing holding her back will be her activity level. - case management working on dispo. Appreciate their recs.   Appreciate nephro recs S/p psychiatry consult S/p Consult to pastoral care Continue physical therapy. Very deconditioned. Case management planning for possible inpatient rehab, though may not require. Regular diet Plan to d/c IV access tomorrow- still has A line   Christeen Douglas, MD   LOS: 13 days   Christeen Douglas 05/10/2018, 1:02 PMPatient ID: [name], female   DOB: November 14, 1976, 42 y.o.   MRN: [id number]

## 2018-05-10 NOTE — Progress Notes (Signed)
Pt transferred back to bed after sitting in chair for 2 1/2 hours. 2 person assist for transfer.

## 2018-05-10 NOTE — Progress Notes (Signed)
PHARMACY - PHYSICIAN COMMUNICATION CRITICAL VALUE ALERT - BLOOD CULTURE IDENTIFICATION (BCID)  [name] is an 42 y.o. female who [facility]. Patient is s/p abdominal hysterectomy and still with central line in place.  Assessment:  Blood culture with GPC in aerobic bottle of one set (1 of 4), elevated WBC  Name of physician (or Provider) Contacted: Dalbert GarnetBeasley  Current antibiotics: none  Changes to prescribed antibiotics recommended:  Will order Cefazolin 2g IV q8h  Results for orders placed or performed during the hospital encounter of 04/27/18  Blood Culture ID Panel (Reflexed) (Collected: 05/09/2018  6:02 PM)  Result Value Ref Range   Enterococcus species NOT DETECTED NOT DETECTED   Listeria monocytogenes NOT DETECTED NOT DETECTED   Staphylococcus species DETECTED (A) NOT DETECTED   Staphylococcus aureus NOT DETECTED NOT DETECTED   Methicillin resistance NOT DETECTED NOT DETECTED   Streptococcus species NOT DETECTED NOT DETECTED   Streptococcus agalactiae NOT DETECTED NOT DETECTED   Streptococcus pneumoniae NOT DETECTED NOT DETECTED   Streptococcus pyogenes NOT DETECTED NOT DETECTED   Acinetobacter baumannii NOT DETECTED NOT DETECTED   Enterobacteriaceae species NOT DETECTED NOT DETECTED   Enterobacter cloacae complex NOT DETECTED NOT DETECTED   Escherichia coli NOT DETECTED NOT DETECTED   Klebsiella oxytoca NOT DETECTED NOT DETECTED   Klebsiella pneumoniae NOT DETECTED NOT DETECTED   Proteus species NOT DETECTED NOT DETECTED   Serratia marcescens NOT DETECTED NOT DETECTED   Haemophilus influenzae NOT DETECTED NOT DETECTED   Neisseria meningitidis NOT DETECTED NOT DETECTED   Pseudomonas aeruginosa NOT DETECTED NOT DETECTED   Candida albicans NOT DETECTED NOT DETECTED   Candida glabrata NOT DETECTED NOT DETECTED   Candida krusei NOT DETECTED NOT DETECTED   Candida parapsilosis NOT DETECTED NOT DETECTED   Candida tropicalis NOT DETECTED NOT  DETECTED    Foye DeerLisa G Meagan Ancona 05/10/2018  2:54 PM

## 2018-05-10 NOTE — Progress Notes (Signed)
[facility] [place], [place] 05/10/18  Subjective:  Urine output yesterday was 1.7 L. Creatinine down to 1.1. Serum sodium remains a bit low at 128. Hemoglobin has also dropped to 7.  Objective:  Vital signs in last 24 hours:  Temp:  [98.2 F (36.8 C)-98.4 F (36.9 C)] 98.4 F (36.9 C) (06/20 16100608) Pulse Rate:  [102-105] 102 (06/20 0608) Resp:  [18-20] 18 (06/20 0608) BP: (122-123)/(63-65) 122/63 (06/20 0608) SpO2:  [95 %-97 %] 95 % (06/20 [id number])  Weight change:  Filed Weights   05/03/18 0800  Weight: 93 kg (205 lb)    Intake/Output:    Intake/Output Summary (Last 24 hours) at 05/10/2018 1355 Last data filed at 05/10/2018 1022 Gross per 24 hour  Intake 250 ml  Output 1450 ml  Net -1200 ml     Physical Exam: General: laying in bed  HEENT Moist oral mucus membranes  Neck supple  Pulm/lungs CTAB, normal effort  CVS/Heart S1S2 no rubs  Abdomen:  Mild distension, BS present  Extremities: 3+ b/l LE edema  Neurologic: Alert, oriented x3, follows commands  Skin: No acute rashes   Foley in place, rt nephrostomy       Basic Metabolic Panel:  Recent Labs  Lab 05/05/18 0637 05/05/18 1226 05/05/18 1939 05/06/18 0422 05/07/18 0556 05/08/18 0630 05/10/18 0452  NA 128* 128*  --  130* 130* 129* 128*  K 2.9* 3.1* 3.0* 3.2* 3.7 3.9 4.2  CL 89* 88*  --  90* 92* 92* 91*  CO2 24 24  --  24 25 26 24   GLUCOSE 136* 135*  --  127* 131* 130* 119*  BUN 91* 86*  --  76* 57* 55* 44*  CREATININE 3.74* 3.23*  --  2.01* 1.67* 1.43* 1.14*  CALCIUM 7.0* 7.0*  --  7.1* 7.5* 8.0* 7.9*  MG 2.7*  --   --   --  2.2  --   --   PHOS 5.7*  --   --   --  3.2  --   --      CBC: Recent Labs  Lab 05/05/18 0637 05/06/18 0422 05/07/18 0556 05/09/18 1300 05/10/18 0452  WBC 11.9* 14.2* 17.0* 22.9* 20.4*  HGB 7.7* 7.6* 7.7* 7.2* 7.0*  HCT 22.2* 21.5* 22.8* 21.6* 20.5*  MCV 89.9 89.7 90.3 90.3 89.8  PLT 208 253 332 398 370     No results found for: HEPBSAG,  HEPBSAB, HEPBIGM    Microbiology:  Recent Results (from the past 240 hour(s))  CULTURE, BLOOD (ROUTINE X 2) w Reflex to ID Panel     Status: None (Preliminary result)   Collection Time: 05/09/18  6:02 PM  Result Value Ref Range Status   Specimen Description BLOOD LEFT HAND  Final   Special Requests   Final    BOTTLES DRAWN AEROBIC AND ANAEROBIC Blood Culture adequate volume   Culture  Setup Time   Final    Organism ID to follow AEROBIC BOTTLE ONLY GRAM POSITIVE COCCI Performed at [facility], [address] [place], [place] [address]    Culture GRAM POSITIVE COCCI  Final   Report Status PENDING  Incomplete  CULTURE, BLOOD (ROUTINE X 2) w Reflex to ID Panel     Status: None (Preliminary result)   Collection Time: 05/09/18  7:00 PM  Result Value Ref Range Status   Specimen Description BLOOD RIGHT HAND  Final   Special Requests   Final    BOTTLES DRAWN AEROBIC AND ANAEROBIC Blood Culture  adequate volume   Culture   Final    NO GROWTH < 12 HOURS Performed at [facility], [address] [place], [place] [address]    Report Status PENDING  Incomplete    Coagulation Studies: No results for input(s): LABPROT, INR in the last 72 hours.  Urinalysis: No results for input(s): COLORURINE, LABSPEC, PHURINE, GLUCOSEU, HGBUR, BILIRUBINUR, KETONESUR, PROTEINUR, UROBILINOGEN, NITRITE, LEUKOCYTESUR in the last 72 hours.  Invalid input(s): APPERANCEUR    Imaging: Dg Chest 2 View  Result Date: 05/09/2018 CLINICAL DATA:  Weakness, retaining fluids, leukocytosis, had ureteroscopy and central line placement today EXAM: CHEST - 2 VIEW COMPARISON:  05/03/2018 FINDINGS: LEFT jugular central venous catheter with tip projecting over SVC. Borderline enlargement of cardiac silhouette. Mediastinal contours and pulmonary vascularity normal. Persistent RIGHT basilar atelectasis. Lungs otherwise clear. No pulmonary infiltrate, pleural effusion or pneumothorax. Bones unremarkable.  IMPRESSION: Persistent RIGHT basilar atelectasis. Electronically Signed   By: Ulyses Southward M.D.   On: 05/09/2018 17:18   US Pelvis Limited (transabdominal Only)  Result Date: 05/10/2018 CLINICAL DATA:  Bladder spasms. History of mild bilateral hydronephrosis. EXAM: LIMITED ULTRASOUND OF PELVIS TECHNIQUE: Limited transabdominal ultrasound examination of the pelvis was performed. COMPARISON:  Single view of the abdomen 05/06/2018. FINDINGS: The urinary bladder is not visualized. Reportedly, the patient has a Foley catheter in place but the catheter is not seen. IMPRESSION: As above. Electronically Signed   By: Drusilla Kanner M.D.   On: 05/10/2018 12:11     Medications:    . sodium chloride   Intravenous Once  . docusate sodium  100 mg Oral BID  . feeding supplement (ENSURE ENLIVE)  237 mL Oral TID BM  . furosemide  20 mg Intravenous Once  . heparin injection (subcutaneous)  5,000 Units Subcutaneous Q8H  . mirabegron ER  50 mg Oral Daily  . mirtazapine  15 mg Oral QHS  . nystatin  5 mL Oral QID  . polyethylene glycol  17 g Oral Daily  . sodium chloride flush  10-40 mL Intracatheter Q12H   ALPRAZolam, HYDROmorphone (DILAUDID) injection, lidocaine, Melatonin, menthol-cetylpyridinium, [DISCONTINUED] ondansetron **OR** ondansetron (ZOFRAN) IV, opium-belladonna, oxyCODONE-acetaminophen, phenazopyridine, phenol, promethazine, sodium chloride flush  Assessment/ Plan:  42 y.o. caucasian female is post op abdominal hysterectomy, left salpingo-oophorectomy, laparoscopic lysis of adhesions and retroperitoneal dissection on 04/28/2018.  Followed by cystoscopy with retrograde pyelogram and left ureteral stent placement on 04/29/2018 Baseline creatinine is 0.79 from 04/19/2018 Post op case complicated by ARF, b/l  hydronephrosis  1. ARF, likely multifactorial from severe ATN and b/l hydronephrosis likely from compression of ureters from hematoma, post surgical edema. No ureteral injury.  S/p left ureteral  stent and right nephrostomy 04/30/18.  -Renal function significantly improved.  Creatinine down to 1.1.  Urine output was 1.7 L over the preceding 24 hours.  Continue to monitor renal parameters daily for now.  2. Generalized edema -Patient to be given Lasix 20 mg IV x1 today.  3. Hypokalemia - K currently 4.2 and now normalized,  Continue to follow as pt is receiving lasix today.  4.  Hyponatremia.  Sodium remains a bit low at 128, given lasix today which may help Na to rise.    5.  Anemia unspecified.  Hemoglobin down to 7.0.  Consider blood transfusion but defer to primary team.   LOS: 13 Caspian Deleonardis 6/20/20191:55 PM  [facility] [place], [place] [phone]  Note: This note was prepared with Dragon dictation. Any transcription errors are unintentional

## 2018-05-10 NOTE — Clinical Social Work Note (Signed)
Please refer to RN CM documentation regarding discharge disposition. Patient and husband declined short term rehab. They are going to return home. York SpanielMonica Ferrel [place] MSW,LCSW [phone]

## 2018-05-10 NOTE — Progress Notes (Addendum)
OT Cancellation Note  Patient Details Name: Jonelle SidleGinger B Guyette MRN: [id number] DOB: 01/21/1976   Cancelled Treatment:    Reason Eval/Treat Not Completed: Other (comment).  Pt noted with continued drop in hemoglobin, now 7.0, HCT 20.5. Pt contraindicated for therapy 2:2 low Hgb. 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  Will also wait for vascular's input to better inform pt's plan of care moving forward. Will re-attempt OT treatment at later date/time as pt is medically appropriate.   Richrd PrimeJamie Stiller, MPH, MS, OTR/L ascom [phone] 05/10/18, 11:58 AM

## 2018-05-10 NOTE — Progress Notes (Signed)
Changed pt dressing over nephrostomy tube, dressing saturated.

## 2018-05-10 NOTE — Progress Notes (Signed)
PT Cancellation Note  Patient Details Name: [name] MRN: [id number] DOB: 09/16/1976   Cancelled Treatment:    Reason Eval/Treat Not Completed: Medical issues which prohibited therapy   Chart reviewed.  HgB 7.0 HCt low.  Will hold per therapy protocols.  Pt to receive blood transfusion this pm.  Will continue as appropriate.   Danielle DessSarah Micaila Ziemba 05/10/2018, 1:08 PM

## 2018-05-10 NOTE — Care Management (Signed)
Attempted to meet with patient along with CSW to discuss discharge planning.  Patient declined at this time as she had visitors "and I know they have to get back to work".  RNCM and CSW will attempt to meet with patient at a later time

## 2018-05-10 NOTE — Care Management (Signed)
Patient with complicated stay related to OPERATION:   Laparoscopic lysis of adhesions Laparoscopic retroperitoneal disecion Decision to convert to laparotomy Left salping-oophorectomy Right salpingectomy Supracervical hysterectomy  Cystoscopy  Met with Patient and husband, along with CSW.  PT has assessed patient and recommends CIR.  Per CIR coordinator patient is not at the level to pursue.  Patient and husband agree that they do not wish to purse SNF level of care for patient.  When patient is medically cleared with wish for patient to return home with home health services.  Their preference is [place]. Heads up referral made to [place].  At this time patient has only sat on the edge of the bed, and performed bed exercises with PT.  Bedside nurse to get patient up to chair for dinner today.  Home Equipment needs to be determined.   Barrier: hgb 7.0 patient to be transfused

## 2018-05-11 ENCOUNTER — Encounter: Payer: Self-pay | Admitting: Radiology

## 2018-05-11 ENCOUNTER — Inpatient Hospital Stay: Payer: BLUE CROSS/BLUE SHIELD

## 2018-05-11 LAB — COMPREHENSIVE METABOLIC PANEL
ALT: 56 U/L — AB (ref 14–54)
AST: 86 U/L — AB (ref 15–41)
Albumin: 3 g/dL — ABNORMAL LOW (ref 3.5–5.0)
Alkaline Phosphatase: 233 U/L — ABNORMAL HIGH (ref 38–126)
Anion gap: 12 (ref 5–15)
BUN: 38 mg/dL — AB (ref 6–20)
CALCIUM: 8 mg/dL — AB (ref 8.9–10.3)
CO2: 24 mmol/L (ref 22–32)
CREATININE: 1.19 mg/dL — AB (ref 0.44–1.00)
Chloride: 91 mmol/L — ABNORMAL LOW (ref 101–111)
GFR, EST NON AFRICAN AMERICAN: 55 mL/min — AB (ref >60)
Glucose, Bld: 135 mg/dL — ABNORMAL HIGH (ref 65–99)
Potassium: 3.8 mmol/L (ref 3.5–5.1)
Sodium: 127 mmol/L — ABNORMAL LOW (ref 135–145)
Total Bilirubin: 0.9 mg/dL (ref 0.3–1.2)
Total Protein: 6.3 g/dL — ABNORMAL LOW (ref 6.5–8.1)

## 2018-05-11 LAB — HEMOGLOBIN AND HEMATOCRIT, BLOOD
HEMATOCRIT: 25.6 % — AB (ref 35.0–47.0)
HEMOGLOBIN: 9.2 g/dL — AB (ref 12.0–16.0)

## 2018-05-11 MED ORDER — IOPAMIDOL (ISOVUE-370) INJECTION 76%
125.0000 mL | Freq: Once | INTRAVENOUS | Status: AC | PRN
  Administered 2018-05-11: 125 mL via INTRAVENOUS

## 2018-05-11 MED ORDER — FUROSEMIDE 10 MG/ML IJ SOLN
INTRAMUSCULAR | Status: AC
  Filled 2018-05-11: qty 4

## 2018-05-11 NOTE — Progress Notes (Signed)
Physical Therapy Treatment Patient Details Name: [name] MRN: [id number] DOB: 07/18/1976 Today's Date: 05/11/2018    History of Present Illness Pt is a 42 y/o F s/p laparascopic hysterectomy, Bil aslpingectomy 6/7.  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  Developed hematuria and L hydronephrosis 06/09 requiring cystoscopy and left ureteral stent placement. Renal US 6/10 revealed right hydronephrosis. S/P R percutaneous nephrostomy tube placement 6/10.  NG tube placed 6/10.  Pt with AKI and ileus.  Pt's PMH includes DVT.     PT Comments    Pt in bed ready for session.  Participated in exercises as described below.  To edge of bed with mod a x 2.  Pt with difficulty moving LE's due to weakness and edema and with reaching for rails again due to abdominal edema.  Once sitting she was able to remain upright for 15 minutes with intermittent UE support without LOB or excessive fatigue.  Tried standing with with standard walker but pt was unable to get knees inside walker due to edema.  Bariatric walker was obtained for extra width.  While she was able to get LE's inside, it was still a tight fit.  She was able to stand x 2 with walker with mod/max a x 2 with knees blocked by son to prevent buckling during standing.  Once up, she did not need blocking but required min/mod a x 2 to remains up with tactile cues at shoulders, belt and on hips.  Verbal cues also to correct posture.  She was only able to remain standing for less than 30 seconds due to fatigue.  She remains unable to take any steps with walker.  Lateral scoot transfer to recliner was practiced to simulate and prepare for sliding board transfers to wheelchair/drop arm commode and shower seat.  She had difficulty but was able to transfer with mod a x 2 with heavy verbal cues.    Discussed discharge plan at length with pt and son.  She stated she  wants to return home and is not considering SNF at this time.  She stated she will have +2 assist at home at all times.    Pt will need: Hospital bed Wide wheelchair (due to edema) and cushion Bariatric walker (due to edema) Sliding board Gait belt Drop arm wide commode Shower bench (to extend over tub to allow for sliding transfers)  HHPT, HHOT and nursing services would also be appropriate.  Ambulance transfer home will also be necessary as transfer safely in and out of car is compromised and she is unable to go up/down stairs into her home.   Patient suffers from complications from hysterecomy which impairs her ability to perform daily activities like toileting, dressing, grooming, bathing, and household mobility in the home. A walker will not resolve the patient's issue with performing activities of daily living. A wheelchair is required/recommended and will allow patient to safely perform daily activities.   Patient can safely propel the wheelchair in the home or has a caregiver who can provide assistance.     Follow Up Recommendations  CIR;Other (comment) see above     Equipment Recommendations  Rolling walker with 5" wheels;3in1 (PT);Wheelchair (measurements PT);Wheelchair cushion (measurements PT);Hospital bed;Other (comment)    Recommendations for Other Services       Precautions / Restrictions Precautions Precautions: Fall Precaution Comments: ureteral drain, R nephrostomy tube, L IJ CVC line Restrictions Weight Bearing Restrictions: No  Mobility  Bed Mobility Overal bed mobility: Needs Assistance Bed Mobility: Supine to Sit     Supine to sit: Min assist;Mod assist;+2 for physical assistance        Transfers Overall transfer level: Needs assistance Equipment used: Rolling walker (2 wheeled) Transfers: Sit to/from Stand;Lateral/Scoot Transfers Sit to Stand: Mod assist;Max assist;+2 physical assistance        Lateral/Scoot Transfers: Mod assist;+2  physical assistance    Ambulation/Gait             General Gait Details: unable   Stairs             Wheelchair Mobility    Modified Rankin (Stroke Patients Only)       Balance Overall balance assessment: Needs assistance Sitting-balance support: Bilateral upper extremity supported;Feet supported Sitting balance-Leahy Scale: Fair Sitting balance - Comments: able to sit for an extended time today with intermittent UE support                                    Cognition Arousal/Alertness: Awake/alert Behavior During Therapy: WFL for tasks assessed/performed Overall Cognitive Status: Within Functional Limits for tasks assessed                                        Exercises Other Exercises Other Exercises: BLE AAROM for ankle pumps, heel slides, ab/add, SLR x 10 Other Exercises: stood x 3 with bariatric walker with +2 assist and +1 assist from son for bracing L knee.     General Comments        Pertinent Vitals/Pain Pain Assessment: No/denies pain Pain Intervention(s): Monitored during session    Home Living                      Prior Function            PT Goals (current goals can now be found in the care plan section) Progress towards PT goals: Progressing toward goals    Frequency    7X/week      PT Plan Current plan remains appropriate;Other (comment)    Co-evaluation              AM-PAC PT "6 Clicks" Daily Activity  Outcome Measure  Difficulty turning over in bed (including adjusting bedclothes, sheets and blankets)?: Unable Difficulty moving from lying on back to sitting on the side of the bed? : Unable Difficulty sitting down on and standing up from a chair with arms (e.g., wheelchair, bedside commode, etc,.)?: Unable Help needed moving to and from a bed to chair (including a wheelchair)?: A Lot Help needed walking in hospital room?: Total Help needed climbing 3-5 steps with a  railing? : Total 6 Click Score: 7    End of Session Equipment Utilized During Treatment: Gait belt Activity Tolerance: Patient tolerated treatment well;Patient limited by fatigue Patient left: in chair;with chair alarm set;with call bell/phone within reach;with family/visitor present Nurse Communication: Mobility status;Other (comment)       Time: 1610-9604 PT Time Calculation (min) (ACUTE ONLY): 47 min  Charges:  $Therapeutic Exercise: 8-22 mins $Therapeutic Activity: 23-37 mins                    G Codes:       Danielle Dess, PTA 05/11/18, 11:46 AM

## 2018-05-11 NOTE — Progress Notes (Signed)
Patient with profound swelling status post hysterectomy with a very complicated postoperative course.  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  There may be some acute clot in the left pelvic area.  Given her hematuria full anticoagulation with heparin cannot be undertaken at this time.  However if her urine clears and her hemoglobin remained stable I would advocate for anticoagulation and certainly if feasible she should be discharged home on anticoagulation.  I have elevated the foot of her bed as high as possible and we will gently wrap her legs and Ace wraps.  Given her low serum protein we will asked nutrition to assess to see if we can improve her protein stores as this is certainly not helping with her edema.  Full consult to follow

## 2018-05-11 NOTE — Progress Notes (Signed)
Contacted on call urologist, Dr. Ronne BinningMckenzie. Pt having increased cramps and and zero output with foley. RN attempted to irrigate with no success. Verbal order given to resume CBI

## 2018-05-11 NOTE — Progress Notes (Signed)
OT Cancellation Note  Patient Details Name: [name] MRN: [id number] DOB: 04/10/1976   Cancelled Treatment:    Reason Eval/Treat Not Completed: Other (comment). Spoke with RN regarding appropriateness for OT treatment. Per RN, vascular surgery has not consulted yet. RN reports that she is helping resolve a catheter issue with the patient. Will hold this pm and re-attempt next date as pt is available and medically appropriate.   Richrd PrimeJamie Stiller, MPH, MS, OTR/L ascom [phone] 05/11/18, 2:07 PM

## 2018-05-11 NOTE — Progress Notes (Signed)
Patient ID: [name], female   DOB: 04-07-1976, 42 y.o.   MRN: [id number] 13 Days Post-Op  Procedure(s):  1. 04/28/18: ATTEMPTED LAPAROSCOPIC HYSTERECTOMY CONVERTED TO SUPRACERVICAL ABDOMINAL HYSTERECTOMY (N/A) SALPINGO OOPHORECTOMY (Left) CYSTOSCOPY (N/A) UNILATERAL SALPINGECTOMY (Right) LAPAROSCOPIC LYSIS OF ADHESIONS, RETROPERITONEAL DISSECTION  2. 04/29/18: CYSTOSCOPY WITH RETROGRADE PYELOGRAM/Left URETERAL STENT PLACEMENT, ureteroscopy (Bilateral)    Subjective: Food starting to taste good again, and she did get out of bed for 2 hours yesterday. Lower legs very heavy from edema.  Clinical assessment:  Foley in place with dark red urine, minimal. RN with trouble flushing cath this morning. Her right nephrostomy tube is clamped after imaging yesterday and draining into dressing.    S/p 2u pRBCs yesterday with appropriate bump in h/h. States she is "feeling much better" after transfusion and is sleeping better as well.  Working with PT and OT. Lower ext edema to now umbilicus is significant, and limiting her activity.  Pt is on telemetry.  1 of 4 blood culture tubes returned yesterday with staph aureus, NOT MRSA. May be contaminant, but with central line will treat with ancef per pharmacy recs. Central line culture pending as well.   Objective: Vital signs in last 24 hours: Temp:  [97.5 F (36.4 C)-98.3 F (36.8 C)] 98.2 F (36.8 C) (06/21 0625) Pulse Rate:  [102-112] 106 (06/21 0625) Resp:  [16-18] 16 (06/21 0625) BP: (110-129)/(6-69) 119/63 (06/21 0625) SpO2:  [97 %-100 %] 97 % (06/21 0625)  Intake/Output  Intake/Output Summary (Last 24 hours) at 05/11/2018 0950 Last data filed at 05/11/2018 0910 Gross per 24 hour  Intake 1978 ml  Output 1055 ml  Net 923 ml    Physical Exam:  General: Oriented and responsive, appears less anxious and no longer ill CV: Regular rhythm, no murmurs, regular rate Lungs: Decreased breath sounds in bilateral bases, GI: Non-tender,  non-distended. Normal bowel sounds. Ecchymosis over mons improving, and lap incisions clean and dry. New pitting edema to umbilicus.  Incisions: Clean and dry Urine: Pink, Foley in place Extremities: Nontender, no erythema, improving edema in lower extremities Fingers, hands and arms are no longer edematous.  Lab Results: Recent Labs    05/09/18 1300 05/10/18 0452 05/11/18 0406  HGB 7.2* 7.0* 9.2*  HCT 21.6* 20.5* 25.6*  WBC 22.9* 20.4*  --   PLT 398 370  --                  Results for orders placed or performed during the hospital encounter of 04/27/18 (from the past 24 hour(s))  Type and screen     Status: None (Preliminary result)   Collection Time: 05/10/18  2:13 PM  Result Value Ref Range   ABO/RH(D) O POS    Antibody Screen POS    Sample Expiration 05/13/2018    Antibody Identification ANTI E ANTI [facility] Stephania Fragmin)    Unit Number [id number]    Blood Component Type RED CELLS,LR    Unit division 00    Status of Unit ISSUED,FINAL    Transfusion Status OK TO TRANSFUSE    Crossmatch Result COMPATIBLE    Donor AG Type NEGATIVE FOR E ANTIGEN NEGATIVE FOR c ANTIGEN    Unit Number [id number]    Blood Component Type RED CELLS,LR    Unit division 00    Status of Unit ALLOCATED    Transfusion Status OK TO TRANSFUSE    Crossmatch Result COMPATIBLE    Unit Number [id number]    Blood Component Type RBC LR PHER1  Unit division 00    Status of Unit ISSUED,FINAL    Transfusion Status OK TO TRANSFUSE    Crossmatch Result COMPATIBLE    Donor AG Type NEGATIVE FOR E ANTIGEN NEGATIVE FOR c ANTIGEN    Unit Number [id number]    Blood Component Type RED CELLS,LR    Unit division 00    Status of Unit ALLOCATED    Transfusion Status OK TO TRANSFUSE    Crossmatch Result COMPATIBLE   Prepare RBC     Status: None   Collection Time: 05/10/18  2:13 PM  Result Value Ref Range   Order Confirmation      ORDER PROCESSED BY BLOOD BANK Performed at [facility], [address] [place], [place] [address]   Urinalysis, Routine w reflex microscopic     Status: Abnormal   Collection Time: 05/10/18  6:23 PM  Result Value Ref Range   Color, Urine RED (A) YELLOW   APPearance TURBID (A) CLEAR   Specific Gravity, Urine 1.020 1.005 - 1.030   pH  5.0 - 8.0    TEST NOT REPORTED DUE TO COLOR INTERFERENCE OF URINE PIGMENT   Glucose, UA (A) NEGATIVE mg/dL    TEST NOT REPORTED DUE TO COLOR INTERFERENCE OF URINE PIGMENT   Hgb urine dipstick (A) NEGATIVE    TEST NOT REPORTED DUE TO COLOR INTERFERENCE OF URINE PIGMENT   Bilirubin Urine (A) NEGATIVE    TEST NOT REPORTED DUE TO COLOR INTERFERENCE OF URINE PIGMENT   Ketones, ur (A) NEGATIVE mg/dL    TEST NOT REPORTED DUE TO COLOR INTERFERENCE OF URINE PIGMENT   Protein, ur (A) NEGATIVE mg/dL    TEST NOT REPORTED DUE TO COLOR INTERFERENCE OF URINE PIGMENT   Nitrite (A) NEGATIVE    TEST NOT REPORTED DUE TO COLOR INTERFERENCE OF URINE PIGMENT   Leukocytes, UA (A) NEGATIVE    TEST NOT REPORTED DUE TO COLOR INTERFERENCE OF URINE PIGMENT   RBC / HPF >50 (H) 0 - 5 RBC/hpf   WBC, UA >50 (H) 0 - 5 WBC/hpf   Bacteria, UA NONE SEEN NONE SEEN   Squamous Epithelial / LPF NONE SEEN 0 - 5  CULTURE, BLOOD (ROUTINE X 2) w Reflex to ID Panel     Status: None (Preliminary result)   Collection Time: 05/10/18  7:10 PM  Result Value Ref Range   Specimen Description BLOOD LINE DRAW PER MD    Special Requests      BOTTLES DRAWN AEROBIC AND ANAEROBIC Blood Culture results may not be optimal due to an excessive volume of blood received in culture bottles   Culture      NO GROWTH < 12 HOURS Performed at [facility], [address] [place], [place] [address]    Report Status PENDING   Hemoglobin and hematocrit, blood     Status: Abnormal   Collection Time: 05/11/18  4:06 AM  Result Value Ref Range   Hemoglobin 9.2 (L) 12.0 - 16.0 g/dL   HCT 62.1 (L) 30.8 - 65.7 %    Assessment/Plan: 13 Days Post-Op         Procedure(s): 1. 04/28/18: ATTEMPTED LAPAROSCOPIC HYSTERECTOMY CONVERTED TO SUPRACERVICAL ABDOMINAL HYSTERECTOMY (N/A) SALPINGO OOPHORECTOMY (Left) CYSTOSCOPY (N/A) UNILATERAL SALPINGECTOMY (Right) LAPAROSCOPIC LYSIS OF ADHESIONS, RETROPERITONEAL DISSECTION   2. 04/29/18: CYSTOSCOPY WITH RETROGRADE PYELOGRAM/Left URETERAL STENT PLACEMENT, ureteroscopy (Bilateral)  IMP: S/P hysterectomy 06/07 with intra-abdominal bleeding complications Status post massive transfusion protocol Anxiety/situational depression Persistent hematuria, much improved Left hydronephrosis - Status post  left ureteral stent Right hydronephrosis - Status post percutaneous nephrostomy tube AKI, resolving.   Hypervolemia/anasarca, resolved Acute blood loss anemia, not actively bleeding Ileus with nausea, resolved Mildly elevated WCB without clear infectious process Worsening anasarca  PLAN/REC: - Foley still with hematuria and now not draining this morning. Edema significant. Normal telemetry, no evidence of cardiac dysfunction. Encourage ambulation. Lasix yesterday without increase in urine output.  - B/c of her DVT hx and no ambulation, she will need to be on anticoagulation x14 days after she is ambulatory. Anticoagulation+ foley bulb may be leading to the hematuria. Appreciate urology. - Hgb bump with transfusion overnight appropriate. - Generalized edema. Appreciate nephrology following. - Ancef for 1 of 4 culture positive. Final blood culture and urine culture pending. - Vascular surgery consult due to vascular abnormalities in surgery and now worsening swelling in lower extremities for recommendations.  Main issue will be activity and dispo planning. Encouraged patient to actively work with PT/OT for ambulation. She is anxious to go home, and after urology clears cath removal, the only thing holding her back will be her activity level.    Appreciate nephro recs S/p psychiatry consult S/p Consult to pastoral  care Continue physical therapy. Very deconditioned. Case management planning for possible inpatient rehab, though may not require. PT has determine to be discharged home with home health, and case management working on that with her and family. Regular diet Initially planned to d/c IV access but am concerned as foley not draining and edema worsening- still has A line   Christeen DouglasBethany Daiki Dicostanzo, MD Cell phone: [phone]   LOS: 14 days   Christeen DouglasBethany Libbi Towner 05/11/2018, 9:50 AMPatient ID: Amy SidleGinger B [name], female   DOB: 08/17/1976, 42 y.o.   MRN: [id number]

## 2018-05-11 NOTE — Progress Notes (Signed)
[facility], [place] 05/11/18  Subjective:  Urine output was only 805 cc over the preceding 24 hours despite Lasix administration. Patient also received Lasix today. Serum creatinine relatively stable at 1.14. Currently receiving continuous bladder irrigation.  Objective:  Vital signs in last 24 hours:  Temp:  [97.3 F (36.3 C)-98.3 F (36.8 C)] 97.3 F (36.3 C) (06/21 1228) Pulse Rate:  [99-112] 99 (06/21 1228) Resp:  [16-18] 17 (06/21 1228) BP: (110-129)/(6-69) 119/61 (06/21 1228) SpO2:  [97 %-100 %] 100 % (06/21 1228)  Weight change:  Filed Weights   05/03/18 0800  Weight: 93 kg (205 lb)    Intake/Output:    Intake/Output Summary (Last 24 hours) at 05/11/2018 1547 Last data filed at 05/11/2018 1130 Gross per 24 hour  Intake 2218 ml  Output 955 ml  Net 1263 ml     Physical Exam: General: laying in bed  HEENT Moist oral mucus membranes  Neck supple  Pulm/lungs CTAB, normal effort  CVS/Heart S1S2 no rubs  Abdomen:  Mild distension, BS present  Extremities: 3+ b/l LE edema, tight  Neurologic: Alert, oriented x3, follows commands  Skin: No acute rashes   Foley in place, hematuria noted in bag, rt nephrostomy       Basic Metabolic Panel:  Recent Labs  Lab 05/05/18 0637 05/05/18 1226 05/05/18 1939 05/06/18 0422 05/07/18 0556 05/08/18 0630 05/10/18 0452  NA 128* 128*  --  130* 130* 129* 128*  K 2.9* 3.1* 3.0* 3.2* 3.7 3.9 4.2  CL 89* 88*  --  90* 92* 92* 91*  CO2 24 24  --  24 25 26 24   GLUCOSE 136* 135*  --  127* 131* 130* 119*  BUN 91* 86*  --  76* 57* 55* 44*  CREATININE 3.74* 3.23*  --  2.01* 1.67* 1.43* 1.14*  CALCIUM 7.0* 7.0*  --  7.1* 7.5* 8.0* 7.9*  MG 2.7*  --   --   --  2.2  --   --   PHOS 5.7*  --   --   --  3.2  --   --      CBC: Recent Labs  Lab 05/05/18 0637 05/06/18 0422 05/07/18 0556 05/09/18 1300 05/10/18 0452 05/11/18 0406  WBC 11.9* 14.2* 17.0* 22.9* 20.4*  --   HGB 7.7* 7.6* 7.7* 7.2* 7.0*  9.2*  HCT 22.2* 21.5* 22.8* 21.6* 20.5* 25.6*  MCV 89.9 89.7 90.3 90.3 89.8  --   PLT 208 253 332 398 370  --      No results found for: HEPBSAG, HEPBSAB, HEPBIGM    Microbiology:  Recent Results (from the past 240 hour(s))  Urine Culture     Status: None   Collection Time: 05/09/18  4:30 PM  Result Value Ref Range Status   Specimen Description   Final    URINE, CATHETERIZED Performed at [facility], [address], [place], [place] [address]    Special Requests   Final    Normal Performed at [facility], [address] [place], [place] [address]    Culture   Final    NO GROWTH Performed at [facility], [address], [place], [place] [address]    Report Status 05/10/2018 FINAL  Final  CULTURE, BLOOD (ROUTINE X 2) w Reflex to ID Panel     Status: Abnormal (Preliminary result)   Collection Time: 05/09/18  6:02 PM  Result Value Ref Range Status   Specimen Description   Final  BLOOD LEFT HAND Performed at [facility], [address] [place], [place] [address]    Special Requests   Final    BOTTLES DRAWN AEROBIC AND ANAEROBIC Blood Culture adequate volume Performed at [facility], [address] [place], [place] [address]    Culture  Setup Time   Final    AEROBIC BOTTLE ONLY GRAM POSITIVE COCCI CRITICAL RESULT CALLED TO, READ BACK BY AND VERIFIED WITH: LISA KLUTTZ 05/10/18 1433 KLW    Culture (A)  Final    STAPHYLOCOCCUS SPECIES (COAGULASE NEGATIVE) THE SIGNIFICANCE OF ISOLATING THIS ORGANISM FROM A SINGLE SET OF BLOOD CULTURES WHEN MULTIPLE SETS ARE DRAWN IS UNCERTAIN. PLEASE NOTIFY THE MICROBIOLOGY DEPARTMENT WITHIN ONE WEEK IF SPECIATION AND SENSITIVITIES ARE REQUIRED. Performed at [facility], [address] [place], [place] [address]    Report Status PENDING  Incomplete  Blood Culture ID Panel (Reflexed)     Status: Abnormal   Collection Time: 05/09/18  6:02 PM  Result Value Ref Range Status    Enterococcus species NOT DETECTED NOT DETECTED Final   Listeria monocytogenes NOT DETECTED NOT DETECTED Final   Staphylococcus species DETECTED (A) NOT DETECTED Final    Comment: Methicillin (oxacillin) susceptible coagulase negative staphylococcus. Possible blood culture contaminant (unless isolated from more than one blood culture draw or clinical case suggests pathogenicity). No antibiotic treatment is indicated for blood  culture contaminants. CRITICAL RESULT CALLED TO, READ BACK BY AND VERIFIED WITH: LISA KLUTTZ 05/10/18 1433 KLW    Staphylococcus aureus NOT DETECTED NOT DETECTED Final   Methicillin resistance NOT DETECTED NOT DETECTED Final   Streptococcus species NOT DETECTED NOT DETECTED Final   Streptococcus agalactiae NOT DETECTED NOT DETECTED Final   Streptococcus pneumoniae NOT DETECTED NOT DETECTED Final   Streptococcus pyogenes NOT DETECTED NOT DETECTED Final   Acinetobacter baumannii NOT DETECTED NOT DETECTED Final   Enterobacteriaceae species NOT DETECTED NOT DETECTED Final   Enterobacter cloacae complex NOT DETECTED NOT DETECTED Final   Escherichia coli NOT DETECTED NOT DETECTED Final   Klebsiella oxytoca NOT DETECTED NOT DETECTED Final   Klebsiella pneumoniae NOT DETECTED NOT DETECTED Final   Proteus species NOT DETECTED NOT DETECTED Final   Serratia marcescens NOT DETECTED NOT DETECTED Final   Haemophilus influenzae NOT DETECTED NOT DETECTED Final   Neisseria meningitidis NOT DETECTED NOT DETECTED Final   Pseudomonas aeruginosa NOT DETECTED NOT DETECTED Final   Candida albicans NOT DETECTED NOT DETECTED Final   Candida glabrata NOT DETECTED NOT DETECTED Final   Candida krusei NOT DETECTED NOT DETECTED Final   Candida parapsilosis NOT DETECTED NOT DETECTED Final   Candida tropicalis NOT DETECTED NOT DETECTED Final    Comment: Performed at [facility], [address] [place], [place] [address]  CULTURE, BLOOD (ROUTINE X 2) w Reflex to ID Panel     Status:  None (Preliminary result)   Collection Time: 05/09/18  7:00 PM  Result Value Ref Range Status   Specimen Description BLOOD RIGHT HAND  Final   Special Requests   Final    BOTTLES DRAWN AEROBIC AND ANAEROBIC Blood Culture adequate volume   Culture   Final    NO GROWTH 2 DAYS Performed at [facility], [address] [place], [place] [address]    Report Status PENDING  Incomplete  CULTURE, BLOOD (ROUTINE X 2) w Reflex to ID Panel     Status: None (Preliminary result)   Collection Time: 05/10/18  7:10 PM  Result Value Ref Range  Status   Specimen Description BLOOD LINE DRAW PER MD  Final   Special Requests   Final    BOTTLES DRAWN AEROBIC AND ANAEROBIC Blood Culture results may not be optimal due to an excessive volume of blood received in culture bottles   Culture   Final    NO GROWTH < 12 HOURS Performed at [facility], [address] [place], [place] [id number]    Report Status PENDING  Incomplete    Coagulation Studies: No results for input(s): LABPROT, INR in the last 72 hours.  Urinalysis: Recent Labs    05/10/18 1823  COLORURINE RED*  LABSPEC 1.020  PHURINE TEST NOT REPORTED DUE TO COLOR INTERFERENCE OF URINE PIGMENT  GLUCOSEU TEST NOT REPORTED DUE TO COLOR INTERFERENCE OF URINE PIGMENT*  HGBUR TEST NOT REPORTED DUE TO COLOR INTERFERENCE OF URINE PIGMENT*  BILIRUBINUR TEST NOT REPORTED DUE TO COLOR INTERFERENCE OF URINE PIGMENT*  KETONESUR TEST NOT REPORTED DUE TO COLOR INTERFERENCE OF URINE PIGMENT*  PROTEINUR TEST NOT REPORTED DUE TO COLOR INTERFERENCE OF URINE PIGMENT*  NITRITE TEST NOT REPORTED DUE TO COLOR INTERFERENCE OF URINE PIGMENT*  LEUKOCYTESUR TEST NOT REPORTED DUE TO COLOR INTERFERENCE OF URINE PIGMENT*      Imaging: Dg Chest 2 View  Result Date: 05/09/2018 CLINICAL DATA:  Weakness, retaining fluids, leukocytosis, had ureteroscopy and central line placement today EXAM: CHEST - 2 VIEW COMPARISON:  05/03/2018 FINDINGS: LEFT jugular  central venous catheter with tip projecting over SVC. Borderline enlargement of cardiac silhouette. Mediastinal contours and pulmonary vascularity normal. Persistent RIGHT basilar atelectasis. Lungs otherwise clear. No pulmonary infiltrate, pleural effusion or pneumothorax. Bones unremarkable. IMPRESSION: Persistent RIGHT basilar atelectasis. Electronically Signed   By: Ulyses SouthwardMark  Boles M.D.   On: 05/09/2018 17:18   [place] Pelvis Limited (transabdominal Only)  Result Date: 05/10/2018 CLINICAL DATA:  Bladder spasms. History of mild bilateral hydronephrosis. EXAM: LIMITED ULTRASOUND OF PELVIS TECHNIQUE: Limited transabdominal ultrasound examination of the pelvis was performed. COMPARISON:  Single view of the abdomen 05/06/2018. FINDINGS: The urinary bladder is not visualized. Reportedly, the patient has a Foley catheter in place but the catheter is not seen. IMPRESSION: As above. Electronically Signed   By: Drusilla Kannerhomas  Dalessio M.D.   On: 05/10/2018 12:11   Ir Nephrostogram Right Thru Existing Access  Result Date: 05/10/2018 INDICATION: Thermal bladder injury with hematuria requiring continuous bladder irrigation. Renal insufficiency with right hydronephrosis, requiring right percutaneous nephrostomy catheter placement 04/30/2018. Antegrade nephrostogram requested in preparation for catheter removal. EXAM: ANTEGRADE NEPHROSTOGRAM THROUGH EXISTING CATHETER COMPARISON:  CT 05/02/2018 MEDICATIONS: None indicated ANESTHESIA/SEDATION: None required CONTRAST:  Isovue-300 10 mL-administered into the collecting system(s) PROCEDURE: Informed written consent was obtained from the patient after a thorough discussion of the procedural risks, benefits and alternatives. All questions were addressed. A timeout was performed prior to the initiation of the procedure. Scout images were obtained. Gentle hand injection of contrast through the right nephrostomy catheter was performed under fluoroscopy. Representative images were obtained.  Findings were telephoned to Dr. Apolinar JunesBrandon. The catheter was flushed and capped. FLUOROSCOPY TIME:  0.4 minute; 65 mGy COMPLICATIONS: None immediate. IMPRESSION: 1. Injection demonstrates decompression of the right renal collecting system, wide patency of the ureter, flow into the decompressed urinary bladder. After telephone discussion of the findings with Dr. Apolinar JunesBrandon, the catheter was capped externally to allow internal drainage, while maintaining percutaneous nephrostomy access. Electronically Signed   By: Corlis Leak  Hassell M.D.   On: 05/10/2018 16:19     Medications:   .  ceFAZolin (ANCEF)  IV Stopped (05/11/18 1342)   . docusate sodium  100 mg Oral BID  . feeding supplement (ENSURE ENLIVE)  237 mL Oral TID BM  . heparin injection (subcutaneous)  5,000 Units Subcutaneous Q8H  . mirabegron ER  50 mg Oral Daily  . mirtazapine  15 mg Oral QHS  . nystatin  5 mL Oral QID  . polyethylene glycol  17 g Oral Daily  . sodium chloride flush  10-40 mL Intracatheter Q12H   ALPRAZolam, HYDROmorphone (DILAUDID) injection, lidocaine, Melatonin, menthol-cetylpyridinium, [DISCONTINUED] ondansetron **OR** ondansetron (ZOFRAN) IV, opium-belladonna, oxyCODONE-acetaminophen, phenazopyridine, phenol, promethazine, sodium chloride flush  Assessment/ Plan:  42 y.o. caucasian female is post op abdominal hysterectomy, left salpingo-oophorectomy, laparoscopic lysis of adhesions and retroperitoneal dissection on 04/28/2018.  Followed by cystoscopy with retrograde pyelogram and left ureteral stent placement on 04/29/2018 Baseline creatinine is 0.79 from 04/19/2018 Post op case complicated by ARF, b/l  hydronephrosis  1. ARF, likely multifactorial from severe ATN and b/l hydronephrosis likely from compression of ureters from hematoma, post surgical edema. No ureteral injury.  S/p left ureteral stent and right nephrostomy 04/30/18.  -Creatinine yesterday was 1.1.  We plan to repeat renal parameters today.  Patient has been receiving  Lasix.  2. Generalized edema -Patient administered intravenous Lasix the past 2 days.  Recheck serum electrolytes as above.  3. Hypokalemia -Repeat serum potassium today..  4.  Hyponatremia.  Serum sodium was 128 yesterday.  We plan to recheck this today as above.  5.  Anemia unspecified.  Hemoglobin up to 9.2 posttransfusion.  Patient feeling much better after blood transfusion.   LOS: 14 Mady Haagensen 6/21/20193:47 PM  [facility] [address] [place], [place] [phone]  Note: This note was prepared with Dragon dictation. Any transcription errors are unintentional

## 2018-05-11 NOTE — Care Management (Signed)
Patient has followed up and assessed patient today.  Current recommendations for equipment Hospital bed Wide wheelchair (due to edema) and cushion Bariatric walker (due to edema) Sliding board Gait belt Drop arm wide commode Shower bench (to extend over tub to allow for sliding transfers)   In order to transfer patient also will require hoyer lift. Jason with [facility] given a heads up on equipment needs. Patient's son was in the room this morning, and will be checking to see if there is any equipment available that belongs to his grandmother.    Barrier:  monitoring hgb,  CBI re started

## 2018-05-12 DIAGNOSIS — N179 Acute kidney failure, unspecified: Secondary | ICD-10-CM

## 2018-05-12 DIAGNOSIS — R310 Gross hematuria: Secondary | ICD-10-CM

## 2018-05-12 DIAGNOSIS — N1330 Unspecified hydronephrosis: Secondary | ICD-10-CM

## 2018-05-12 LAB — CULTURE, BLOOD (ROUTINE X 2): SPECIAL REQUESTS: ADEQUATE

## 2018-05-12 LAB — COMPREHENSIVE METABOLIC PANEL
ALBUMIN: 2.9 g/dL — AB (ref 3.5–5.0)
ALT: 42 U/L (ref 14–54)
AST: 63 U/L — AB (ref 15–41)
Alkaline Phosphatase: 215 U/L — ABNORMAL HIGH (ref 38–126)
Anion gap: 11 (ref 5–15)
BUN: 31 mg/dL — ABNORMAL HIGH (ref 6–20)
CHLORIDE: 93 mmol/L — AB (ref 101–111)
CO2: 24 mmol/L (ref 22–32)
Calcium: 8 mg/dL — ABNORMAL LOW (ref 8.9–10.3)
Creatinine, Ser: 1.09 mg/dL — ABNORMAL HIGH (ref 0.44–1.00)
GFR calc non Af Amer: >60 mL/min (ref >60)
Glucose, Bld: 142 mg/dL — ABNORMAL HIGH (ref 65–99)
Potassium: 3.3 mmol/L — ABNORMAL LOW (ref 3.5–5.1)
SODIUM: 128 mmol/L — AB (ref 135–145)
Total Bilirubin: 0.9 mg/dL (ref 0.3–1.2)
Total Protein: 6.2 g/dL — ABNORMAL LOW (ref 6.5–8.1)

## 2018-05-12 LAB — CBC
HCT: 26.1 % — ABNORMAL LOW (ref 35.0–47.0)
Hemoglobin: 9 g/dL — ABNORMAL LOW (ref 12.0–16.0)
MCH: 31 pg (ref 26.0–34.0)
MCHC: 34.6 g/dL (ref 32.0–36.0)
MCV: 89.6 fL (ref 80.0–100.0)
PLATELETS: 319 10*3/uL (ref 150–440)
RBC: 2.91 MIL/uL — AB (ref 3.80–5.20)
RDW: 14.7 % — ABNORMAL HIGH (ref 11.5–14.5)
WBC: 18.1 10*3/uL — AB (ref 3.6–11.0)

## 2018-05-12 MED ORDER — CALCIUM CARBONATE ANTACID 500 MG PO CHEW
1.0000 | CHEWABLE_TABLET | Freq: Every day | ORAL | Status: DC | PRN
  Administered 2018-05-12: 200 mg via ORAL
  Filled 2018-05-12: qty 1

## 2018-05-12 MED ORDER — POTASSIUM CHLORIDE 20 MEQ PO PACK
20.0000 meq | PACK | Freq: Two times a day (BID) | ORAL | Status: AC
  Administered 2018-05-12 – 2018-05-13 (×3): 20 meq via ORAL
  Filled 2018-05-12 (×4): qty 1

## 2018-05-12 MED ORDER — PANTOPRAZOLE SODIUM 40 MG PO TBEC
40.0000 mg | DELAYED_RELEASE_TABLET | Freq: Every day | ORAL | Status: DC
  Administered 2018-05-12 – 2018-05-19 (×7): 40 mg via ORAL
  Filled 2018-05-12 (×7): qty 1

## 2018-05-12 NOTE — Progress Notes (Signed)
[facility], [place] 05/12/18  Subjective:  Creatinine now down to 1.09. Patient reports feeling better. Lower extremities are wrapped now.  Objective:  Vital signs in last 24 hours:  Temp:  [97.5 F (36.4 C)-97.9 F (36.6 C)] 97.9 F (36.6 C) (06/22 0516) Pulse Rate:  [109-116] 109 (06/22 0516) Resp:  [20] 20 (06/22 0516) BP: (124-131)/(58-69) 124/69 (06/22 0516) SpO2:  [99 %-100 %] 100 % (06/22 0516)  Weight change:  Filed Weights   05/03/18 0800  Weight: 93 kg (205 lb)    Intake/Output:    Intake/Output Summary (Last 24 hours) at 05/12/2018 1230 Last data filed at 05/12/2018 1139 Gross per 24 hour  Intake [address] ml  Output 37075 ml  Net -7075 ml     Physical Exam: General: laying in bed  HEENT Moist oral mucus membranes  Neck supple  Pulm/lungs CTAB, normal effort  CVS/Heart S1S2 no rubs  Abdomen:  Mild distension, BS present  Extremities: 3+ b/l LE edema, bilateral LE's wrapped  Neurologic: Alert, oriented x3, follows commands  Skin: No acute rashes   Foley in place, hematuria noted in bag, rt nephrostomy       Basic Metabolic Panel:  Recent Labs  Lab 05/07/18 0556 05/08/18 0630 05/10/18 0452 05/11/18 1548 05/12/18 0517  NA 130* 129* 128* 127* 128*  K 3.7 3.9 4.2 3.8 3.3*  CL 92* 92* 91* 91* 93*  CO2 25 26 24 24 24   GLUCOSE 131* 130* 119* 135* 142*  BUN 57* 55* 44* 38* 31*  CREATININE 1.67* 1.43* 1.14* 1.19* 1.09*  CALCIUM 7.5* 8.0* 7.9* 8.0* 8.0*  MG 2.2  --   --   --   --   PHOS 3.2  --   --   --   --      CBC: Recent Labs  Lab 05/06/18 0422 05/07/18 0556 05/09/18 1300 05/10/18 0452 05/11/18 0406 05/12/18 0517  WBC 14.2* 17.0* 22.9* 20.4*  --  18.1*  HGB 7.6* 7.7* 7.2* 7.0* 9.2* 9.0*  HCT 21.5* 22.8* 21.6* 20.5* 25.6* 26.1*  MCV 89.7 90.3 90.3 89.8  --  89.6  PLT 253 332 398 370  --  319     No results found for: HEPBSAG, HEPBSAB, HEPBIGM    Microbiology:  Recent Results (from the past 240  hour(s))  Urine Culture     Status: None   Collection Time: 05/09/18  4:30 PM  Result Value Ref Range Status   Specimen Description   Final    URINE, CATHETERIZED Performed at [facility], [address] [place], [place] [address]    Special Requests   Final    Normal Performed at [facility], [address], [place], [place] [address]    Culture   Final    NO GROWTH Performed at [facility], [address] [place], [place] [address]    Report Status 05/10/2018 FINAL  Final  CULTURE, BLOOD (ROUTINE X 2) w Reflex to ID Panel     Status: Abnormal   Collection Time: 05/09/18  6:02 PM  Result Value Ref Range Status   Specimen Description   Final    BLOOD LEFT HAND Performed at [facility], [address], [place], [place] [address]    Special Requests   Final    BOTTLES DRAWN AEROBIC AND ANAEROBIC Blood Culture adequate volume Performed at [facility], [address], [place], [place] [address]    Culture  Setup Time  Final    AEROBIC BOTTLE ONLY GRAM POSITIVE COCCI CRITICAL RESULT CALLED TO, READ BACK BY AND VERIFIED WITH: LISA KLUTTZ 05/10/18 1433 KLW    Culture (A)  Final    STAPHYLOCOCCUS SPECIES (COAGULASE NEGATIVE) THE SIGNIFICANCE OF ISOLATING THIS ORGANISM FROM A SINGLE SET OF BLOOD CULTURES WHEN MULTIPLE SETS ARE DRAWN IS UNCERTAIN. PLEASE NOTIFY THE MICROBIOLOGY DEPARTMENT WITHIN ONE WEEK IF SPECIATION AND SENSITIVITIES ARE REQUIRED. Performed at [facility], [address] [place], [place] [address]    Report Status 05/12/2018 FINAL  Final  Blood Culture ID Panel (Reflexed)     Status: Abnormal   Collection Time: 05/09/18  6:02 PM  Result Value Ref Range Status   Enterococcus species NOT DETECTED NOT DETECTED Final   Listeria monocytogenes NOT DETECTED NOT DETECTED Final   Staphylococcus species DETECTED (A) NOT DETECTED Final    Comment: Methicillin (oxacillin) susceptible coagulase negative  staphylococcus. Possible blood culture contaminant (unless isolated from more than one blood culture draw or clinical case suggests pathogenicity). No antibiotic treatment is indicated for blood  culture contaminants. CRITICAL RESULT CALLED TO, READ BACK BY AND VERIFIED WITH: LISA KLUTTZ 05/10/18 1433 KLW    Staphylococcus aureus NOT DETECTED NOT DETECTED Final   Methicillin resistance NOT DETECTED NOT DETECTED Final   Streptococcus species NOT DETECTED NOT DETECTED Final   Streptococcus agalactiae NOT DETECTED NOT DETECTED Final   Streptococcus pneumoniae NOT DETECTED NOT DETECTED Final   Streptococcus pyogenes NOT DETECTED NOT DETECTED Final   Acinetobacter baumannii NOT DETECTED NOT DETECTED Final   Enterobacteriaceae species NOT DETECTED NOT DETECTED Final   Enterobacter cloacae complex NOT DETECTED NOT DETECTED Final   Escherichia coli NOT DETECTED NOT DETECTED Final   Klebsiella oxytoca NOT DETECTED NOT DETECTED Final   Klebsiella pneumoniae NOT DETECTED NOT DETECTED Final   Proteus species NOT DETECTED NOT DETECTED Final   Serratia marcescens NOT DETECTED NOT DETECTED Final   Haemophilus influenzae NOT DETECTED NOT DETECTED Final   Neisseria meningitidis NOT DETECTED NOT DETECTED Final   Pseudomonas aeruginosa NOT DETECTED NOT DETECTED Final   Candida albicans NOT DETECTED NOT DETECTED Final   Candida glabrata NOT DETECTED NOT DETECTED Final   Candida krusei NOT DETECTED NOT DETECTED Final   Candida parapsilosis NOT DETECTED NOT DETECTED Final   Candida tropicalis NOT DETECTED NOT DETECTED Final    Comment: Performed at [facility], [address] [place], [place] [address]  CULTURE, BLOOD (ROUTINE X 2) w Reflex to ID Panel     Status: None (Preliminary result)   Collection Time: 05/09/18  7:00 PM  Result Value Ref Range Status   Specimen Description BLOOD RIGHT HAND  Final   Special Requests   Final    BOTTLES DRAWN AEROBIC AND ANAEROBIC Blood Culture adequate  volume   Culture   Final    NO GROWTH 3 DAYS Performed at [facility], [address] [place], [place] [address]    Report Status PENDING  Incomplete  CULTURE, BLOOD (ROUTINE X 2) w Reflex to ID Panel     Status: None (Preliminary result)   Collection Time: 05/10/18  7:10 PM  Result Value Ref Range Status   Specimen Description BLOOD LINE DRAW PER MD  Final   Special Requests   Final    BOTTLES DRAWN AEROBIC AND ANAEROBIC Blood Culture results may not be optimal due to an excessive volume of blood received in culture bottles   Culture   Final    NO  GROWTH 2 DAYS Performed at [facility], [address] [place], [place] [address]    Report Status PENDING  Incomplete    Coagulation Studies: No results for input(s): LABPROT, INR in the last 72 hours.  Urinalysis: Recent Labs    05/10/18 1823  COLORURINE RED*  LABSPEC 1.020  PHURINE TEST NOT REPORTED DUE TO COLOR INTERFERENCE OF URINE PIGMENT  GLUCOSEU TEST NOT REPORTED DUE TO COLOR INTERFERENCE OF URINE PIGMENT*  HGBUR TEST NOT REPORTED DUE TO COLOR INTERFERENCE OF URINE PIGMENT*  BILIRUBINUR TEST NOT REPORTED DUE TO COLOR INTERFERENCE OF URINE PIGMENT*  KETONESUR TEST NOT REPORTED DUE TO COLOR INTERFERENCE OF URINE PIGMENT*  PROTEINUR TEST NOT REPORTED DUE TO COLOR INTERFERENCE OF URINE PIGMENT*  NITRITE TEST NOT REPORTED DUE TO COLOR INTERFERENCE OF URINE PIGMENT*  LEUKOCYTESUR TEST NOT REPORTED DUE TO COLOR INTERFERENCE OF URINE PIGMENT*      Imaging: Ir Nephrostogram Right Thru Existing Access  Result Date: 05/10/2018 INDICATION: Thermal bladder injury with hematuria requiring continuous bladder irrigation. Renal insufficiency with right hydronephrosis, requiring right percutaneous nephrostomy catheter placement 04/30/2018. Antegrade nephrostogram requested in preparation for catheter removal. EXAM: ANTEGRADE NEPHROSTOGRAM THROUGH EXISTING CATHETER COMPARISON:  CT 05/02/2018 MEDICATIONS: None  indicated ANESTHESIA/SEDATION: None required CONTRAST:  Isovue-300 10 mL-administered into the collecting system(s) PROCEDURE: Informed written consent was obtained from the patient after a thorough discussion of the procedural risks, benefits and alternatives. All questions were addressed. A timeout was performed prior to the initiation of the procedure. Scout images were obtained. Gentle hand injection of contrast through the right nephrostomy catheter was performed under fluoroscopy. Representative images were obtained. Findings were telephoned to Dr. Apolinar Junes. The catheter was flushed and capped. FLUOROSCOPY TIME:  0.4 minute; 65 mGy COMPLICATIONS: None immediate. IMPRESSION: 1. Injection demonstrates decompression of the right renal collecting system, wide patency of the ureter, flow into the decompressed urinary bladder. After telephone discussion of the findings with Dr. Apolinar Junes, the catheter was capped externally to allow internal drainage, while maintaining percutaneous nephrostomy access. Electronically Signed   By: Corlis Leak M.D.   On: 05/10/2018 16:19   Ct Angio Abd/pel W/ And/or W/o  Result Date: 05/12/2018 CLINICAL DATA:  Recent hysterectomy. History of blood clots. Evaluate for portal vein thrombosis. EXAM: CT ANGIOGRAPHY ABDOMEN AND PELVIS WITH CONTRAST AND WITHOUT CONTRAST TECHNIQUE: Multidetector CT imaging of the abdomen and pelvis was performed using the standard protocol during bolus administration of intravenous contrast. Multiplanar reconstructed images and MIPs were obtained and reviewed to evaluate the vascular anatomy. CONTRAST:  ISOVUE-370 IOPAMIDOL (ISOVUE-370) INJECTION 76% COMPARISON:  CT abdomen and pelvis - 05/02/2018; 04/30/2018; 04/28/2018 FINDINGS: VASCULAR Aorta: No significant atherosclerotic plaque within normal caliber abdominal aorta. Celiac: Widely patent though congenitally diminutive as the common hepatic artery is noted to arise from the proximal SMA. SMA: Widely  patent without hemodynamically significant stenosis. As above, there is a replaced common hepatic artery arising from the proximal SMA. This tributaries the SMA appear widely patent without discrete intraluminal filling defect to suggest distal embolism. Renals: Solitary bilaterally. Widely patent without hemodynamically significant stenosis. No vessel irregularity to suggest FMD. IMA: Widely patent without hemodynamically significant narrowing. 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 The left pelvic arterial system is of normal caliber and widely patent without hemodynamically significant stenosis. Proximal Outflow: There  is a AVM within the proximal aspect the left thigh with direct communication between the proximal aspect of the left superficial femoral artery and adjacent left superficial femoral vein (representative images 228 - 242, series 4). The AVM begins approximately 4 cm caudal to the left common femoral artery bifurcation. The right common, superficial and deep femoral arteries atretic though appear patent. Veins: AVM within the proximal aspect the left thigh with direct communication between the proximal aspect of the left superficial femoral artery and the adjacent left superficial femoral vein (representative images 228 - 242, series 4). 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 There is a minimal amount of nonocclusive thrombus within the  proximal/mid aspect the left internal iliac vein (image 173, series 4). As above, there are multiple thrombosed gonadal varicosities within the lower pelvis bilaterally. In particular, there is apparent complete thrombosis of a markedly hypertrophied left gonadal vein to the level of the confluence with the left renal vein (representative coronal image 98, series 12). There is nonocclusive DVT within the proximal and mid aspects of the right superficial femoral vein (images 247 and 275, series 4). The right common, external and internal iliac veins are atretic. 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 Review of the MIP images confirms the above findings. NON-VASCULAR Lower chest: Limited visualization of the lower thorax demonstrates minimal subsegmental atelectasis within the image right lower lobe, minimally improved compared to the 05/02/2018 examination. Minimal dependent subpleural ground-glass atelectasis. Borderline cardiomegaly.  No pericardial effusion. Hepatobiliary: Normal hepatic contour. No discrete hepatic lesions. Normal appearance of the gallbladder given underdistention. No radiopaque gallstones. No intra extrahepatic bili duct dilatation. No ascites. Pancreas: Normal appearance of the pancreas Spleen: Normal appearance of the spleen. Note is made of a small splenule. Adrenals/Urinary Tract: There is symmetric enhancement and excretion of the bilateral kidneys. Read demonstrated mild bilateral pelvicaliectasis this bright appropriate positioning of the right-sided percutaneous nephrostomy catheter and left-sided double-J ureteral stent. No definite renal stones. No perinephric stranding. Normal appearance the bilateral adrenal glands. The urinary bladder is underdistended with a Foley catheter. Stomach/Bowel: Moderate colonic stool burden without evidence of enteric obstruction. Normal appearance of the terminal ileum and retrocecal appendix. No pneumoperitoneum, pneumatosis or  portal venous gas. Lymphatic: No bulky retroperitoneal, mesenteric or pelvic lymphadenopathy. Reproductive: Post hysterectomy however there is an approximately 7.4 x 3.6 x 4.5 cm (axial image 192, series 4, sagittal image 120, series 13) suspected hematoma adjacent to the vaginal cuff. No discrete adnexal lesion. Note is made of thrombosis of multiple bilateral hypertrophied gonadal/adnexal varicosities (representative images 66, 77 and 80, series 5). No definitive discrete adnexal lesion. There is a minimal amount of likely expected fluid within the pelvic cul-de-sac. No definable/drainable fluid collection. Other: Diffuse body wall anasarca. Musculoskeletal: No acute or aggressive osseous abnormalities. Bilateral facet degenerative change within the lower lumbar spine. IMPRESSION: VASCULAR 1. 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 2. 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 3. 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 Of importance,  there is occlusive thrombosis of a markedly hypertrophied left gonadal vein to the level of its confluence with the left renal vein which likely previously served as the predominant venous return from the bilateral lower extremities in the setting of IVC atresia. 4. Nonocclusive DVT within the proximal and mid aspects of the right superficial femoral vein. 5. Patent though atretic right lower extremity inflow and proximal outflow arterial system, congenital versus acquired in the setting of the contralateral proximal left thigh high-flow AVM 6. No evidence of portal venous thrombosis. NON-VASCULAR 1. Suspected 7.4 cm hematoma adjacent to the vaginal cuff. No definable/drainable fluid collections within the abdomen or pelvis. 2. Diffuse body wall anasarca. Critical Value/emergent results  were called by telephone at the time of interpretation on 05/12/2018 at 9:06 am to Dr. Evie LacksEsco, who verbally acknowledged these results. Electronically Signed   By: Simonne ComeJohn  Watts M.D.   On: 05/12/2018 09:22     Medications:   .  ceFAZolin (ANCEF) IV Stopped (05/12/18 0544)   . docusate sodium  100 mg Oral BID  . feeding supplement (ENSURE ENLIVE)  237 mL Oral TID BM  . heparin injection (subcutaneous)  5,000 Units Subcutaneous Q8H  . mirabegron ER  50 mg Oral Daily  . mirtazapine  15 mg Oral QHS  . nystatin  5 mL Oral QID  . pantoprazole  40 mg Oral Daily  . polyethylene glycol  17 g Oral Daily  . sodium chloride flush  10-40 mL Intracatheter Q12H   ALPRAZolam, calcium carbonate, HYDROmorphone (DILAUDID) injection, lidocaine, Melatonin, menthol-cetylpyridinium, [DISCONTINUED] ondansetron **OR** ondansetron (ZOFRAN) IV, opium-belladonna, oxyCODONE-acetaminophen, phenazopyridine, phenol, promethazine, sodium chloride flush  Assessment/ Plan:  42 y.o. caucasian female is post op abdominal hysterectomy, left salpingo-oophorectomy, laparoscopic lysis of adhesions and retroperitoneal dissection on 04/28/2018.  Followed by cystoscopy with retrograde pyelogram and left ureteral stent placement on 04/29/2018 Baseline creatinine is 0.79 from 04/19/2018 Post op case complicated by ARF, b/l  hydronephrosis  1. ARF, likely multifactorial from severe ATN and b/l hydronephrosis likely from compression of ureters from hematoma, post surgical edema. No ureteral injury.  S/p left ureteral stent and right nephrostomy 04/30/18.  -Creatinine continues to trend down slowly.  Creatinine currently 1.09 with a BUN of 31.  Continue to monitor renal function trend.  2. Generalized edema -Bilateral lower extremities have been wrapped which we agree with.  3. Hypokalemia -Potassium 3.3.  We plan to administer potassium chloride today.  4.  Hyponatremia.  Sodium remains low but stable at 128.  Continue to monitor.  5.   Anemia unspecified.  Hemoglobin appears to be stable at 9.0.  Previously received transfusion.  Continue to monitor CBC.   LOS: 15 Mady HaagensenLATEEF, Malvern Kadlec 6/22/201912:30 PM  [facility] [place], [place] [phone]  Note: This note was prepared with Dragon dictation. Any transcription errors are unintentional

## 2018-05-12 NOTE — Progress Notes (Signed)
Patient ID: Amy SidleGinger B [name], female   DOB: 05/15/1976, 42 y.o.   MRN: [id number] 14 Days Post-Op  Procedure(s):  1. 04/28/18: ATTEMPTED LAPAROSCOPIC HYSTERECTOMY CONVERTED TO SUPRACERVICAL ABDOMINAL HYSTERECTOMY (N/A) SALPINGO OOPHORECTOMY (Left) CYSTOSCOPY (N/A) UNILATERAL SALPINGECTOMY (Right) LAPAROSCOPIC LYSIS OF ADHESIONS, RETROPERITONEAL DISSECTION  2. 04/29/18: CYSTOSCOPY WITH RETROGRADE PYELOGRAM/Left URETERAL STENT PLACEMENT, ureteroscopy (Bilateral)    Subjective: Feeling better after vascular surgery wrapped her legs and the swelling has improved. 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 - Nutrition consult by vascular to try to raise albumin  - On Ancef for positive blood culture - Foley still in place, CBI now running after clot and change of cath last night   Objective: Vital signs in last 24 hours: Temp:  [97.3 F (36.3 C)-97.9 F (36.6 C)] 97.9 F (36.6 C) (06/22 0516) Pulse Rate:  [99-116] 109 (06/22 0516) Resp:  [17-20] 20 (06/22 0516) BP: (119-131)/(58-69) 124/69 (06/22 0516) SpO2:  [99 %-100 %] 100 % (06/22 0516)  Intake/Output  Intake/Output Summary (Last 24 hours) at 05/12/2018 1124 Last data filed at 05/12/2018 0649 Gross per 24 hour  Intake 5784627000 ml  Output 30925 ml  Net -3925 ml    Physical Exam:  General: Oriented and responsive, appears more cheerful CV: Regular rhythm, no murmurs, regular rate Lungs: CTAB GI: Non-tender, non-distended. Normal bowel sounds. Ecchymosis over mons improving, and lap incisions clean and dry. Pitting edema to umbilicus.  Incisions: Clean and dry Urine: Pink, Foley in place Extremities: Nontender, no erythema, LE wrapped. Fingers, hands and arms are no longer edematous.  Lab Results: Recent Labs    05/09/18 1300 05/10/18 0452 05/11/18 0406 05/12/18 0517  HGB 7.2* 7.0* 9.2* 9.0*  HCT 21.6* 20.5* 25.6* 26.1*  WBC 22.9* 20.4*  --  18.1*  PLT 398 370  --   319                 Results for orders placed or performed during the hospital encounter of 04/27/18 (from the past 24 hour(s))  Comprehensive metabolic panel     Status: Abnormal   Collection Time: 05/11/18  3:48 PM  Result Value Ref Range   Sodium 127 (L) 135 - 145 mmol/L   Potassium 3.8 3.5 - 5.1 mmol/L   Chloride 91 (L) 101 - 111 mmol/L   CO2 24 22 - 32 mmol/L   Glucose, Bld 135 (H) 65 - 99 mg/dL   BUN 38 (H) 6 - 20 mg/dL   Creatinine, Ser 9.621.19 (H) 0.44 - 1.00 mg/dL   Calcium 8.0 (L) 8.9 - 10.3 mg/dL   Total Protein 6.3 (L) 6.5 - 8.1 g/dL   Albumin 3.0 (L) 3.5 - 5.0 g/dL   AST 86 (H) 15 - 41 U/L   ALT 56 (H) 14 - 54 U/L   Alkaline Phosphatase 233 (H) 38 - 126 U/L   Total Bilirubin 0.9 0.3 - 1.2 mg/dL   GFR calc non Af Amer 55 (L) >60 mL/min   GFR calc Af Amer >60 >60 mL/min   Anion gap 12 5 - 15  CBC     Status: Abnormal   Collection Time: 05/12/18  5:17 AM  Result Value Ref Range   WBC 18.1 (H) 3.6 - 11.0 K/uL   RBC 2.91 (L) 3.80 - 5.20 MIL/uL   Hemoglobin 9.0 (L) 12.0 - 16.0 g/dL   HCT 95.226.1 (L) 84.135.0 - 32.447.0 %   MCV 89.6 80.0 - 100.0 fL   MCH 31.0 26.0 -  34.0 pg   MCHC 34.6 32.0 - 36.0 g/dL   RDW 40.9 (H) 81.1 - 91.4 %   Platelets 319 150 - 440 K/uL  Comprehensive metabolic panel     Status: Abnormal   Collection Time: 05/12/18  5:17 AM  Result Value Ref Range   Sodium 128 (L) 135 - 145 mmol/L   Potassium 3.3 (L) 3.5 - 5.1 mmol/L   Chloride 93 (L) 101 - 111 mmol/L   CO2 24 22 - 32 mmol/L   Glucose, Bld 142 (H) 65 - 99 mg/dL   BUN 31 (H) 6 - 20 mg/dL   Creatinine, Ser 7.82 (H) 0.44 - 1.00 mg/dL   Calcium 8.0 (L) 8.9 - 10.3 mg/dL   Total Protein 6.2 (L) 6.5 - 8.1 g/dL   Albumin 2.9 (L) 3.5 - 5.0 g/dL   AST 63 (H) 15 - 41 U/L   ALT 42 14 - 54 U/L   Alkaline Phosphatase 215 (H) 38 - 126 U/L   Total Bilirubin 0.9 0.3 - 1.2 mg/dL   GFR calc non Af Amer >60 >60 mL/min   GFR calc Af Amer >60 >60 mL/min   Anion gap 11 5 - 15    Assessment/Plan: 14 Days Post-Op         Procedure(s): 1. 04/28/18: ATTEMPTED LAPAROSCOPIC HYSTERECTOMY CONVERTED TO SUPRACERVICAL ABDOMINAL HYSTERECTOMY (N/A) SALPINGO OOPHORECTOMY (Left) CYSTOSCOPY (N/A) UNILATERAL SALPINGECTOMY (Right) LAPAROSCOPIC LYSIS OF ADHESIONS, RETROPERITONEAL DISSECTION   2. 04/29/18: CYSTOSCOPY WITH RETROGRADE PYELOGRAM/Left URETERAL STENT PLACEMENT, ureteroscopy (Bilateral)  IMP: S/P hysterectomy 06/07 with intra-abdominal bleeding complications Status post massive transfusion protocol Anxiety/situational depression Persistent hematuria, much improved Left hydronephrosis - Status post left ureteral stent Right hydronephrosis - Status post percutaneous nephrostomy tube AKI, resolving.   Hypervolemia/anasarca, resolved Acute blood loss anemia, not actively bleeding Ileus with nausea, resolved Mildly elevated WCB without clear infectious process Worsening anasarca  PLAN/REC: - Foley still with hematuria, CBI - Vascular surgery now on board for management of her edema and vena cava occlusion with the resultant sequelae. I have order abdominal binder to see if that helps. Encourage both ambulation and Trendelenburg as tolerated.  - Nutrition consult pending  - Anemia- monitoring h/h   - Ancef for 1 of 4 culture positive. Final blood culture and urine culture pending. Central line in place, culture pending. Afebrile, feels well  Appreciate nephro recs S/p psychiatry consult S/p Consult to pastoral care Continue physical therapy. PT has determine to be discharged home with home health, and case management working on that with her and family. Regular diet Initially planned to d/c IV access but am concerned as foley not draining and edema worsening- still has A line   Christeen Douglas, MD Cell phone: [phone]   LOS: 15 days   Christeen Douglas 05/12/2018, 11:24 AMPatient ID: [name], female   DOB: [birth year], 42 y.o.   MRN: [id number]

## 2018-05-12 NOTE — Progress Notes (Signed)
PT Cancellation Note  Patient Details Name: [name] MRN: [id number] DOB: 07/24/1976   Cancelled Treatment:    Reason Eval/Treat Not Completed: Medical issues which prohibited therapy   Chart reviewed.  Discussed with primary PT.  Will hold session today due to CT results and await anticoagulation/vascular plan.  Will continue tomorrow.   Danielle DessSarah Jeanluc Wegman 05/12/2018, 11:31 AM

## 2018-05-12 NOTE — Progress Notes (Signed)
Urology Consult Follow Up  Subjective: Patient doing better today. She denies any bladder spasms. CBI discontinued this morning and urine remians clear. Nephrostomy tube uncapped last night due to leakage around neph tube. Creatinine 1.09 today  Anti-infectives: Anti-infectives (From admission, onward)   Start     Dose/Rate Route Frequency Ordered Stop   05/10/18 1500  ceFAZolin (ANCEF) IVPB 2g/100 mL premix     2 g 200 mL/hr over 30 Minutes Intravenous Every 8 hours 05/10/18 1453     05/06/18 2200  meropenem (MERREM) 1 g in sodium chloride 0.9 % 100 mL IVPB  Status:  Discontinued     1 g 200 mL/hr over 30 Minutes Intravenous Every 12 hours 05/06/18 1305 05/07/18 0845   05/02/18 1000  meropenem (MERREM) 500 mg in sodium chloride 0.9 % 100 mL IVPB  Status:  Discontinued     500 mg 200 mL/hr over 30 Minutes Intravenous Every 12 hours 05/02/18 0911 05/06/18 1305   05/01/18 2200  piperacillin-tazobactam (ZOSYN) IVPB 3.375 g  Status:  Discontinued     3.375 g 12.5 mL/hr over 240 Minutes Intravenous Every 12 hours 05/01/18 1952 05/02/18 0838   04/30/18 2200  piperacillin-tazobactam (ZOSYN) IVPB 3.375 g  Status:  Discontinued     3.375 g 12.5 mL/hr over 240 Minutes Intravenous Every 12 hours 04/30/18 0833 05/01/18 1509   04/29/18 0330  piperacillin-tazobactam (ZOSYN) IVPB 3.375 g  Status:  Discontinued     3.375 g 12.5 mL/hr over 240 Minutes Intravenous Every 8 hours 04/29/18 0317 04/30/18 0833   04/27/18 0615  ceFAZolin (ANCEF) 2-4 GM/100ML-% IVPB    Note to Pharmacy:  Lorrene ReidJackson, Pamela   : cabinet override      04/27/18 0615 04/27/18 0746   04/27/18 0600  ceFAZolin (ANCEF) IVPB 2g/100 mL premix     2 g 200 mL/hr over 30 Minutes Intravenous On call to O.R. 04/26/18 2240 04/27/18 1142      Current Facility-Administered Medications  Medication Dose Route Frequency Provider Last Rate Last Dose  . ALPRAZolam Prudy Feeler(XANAX) tablet 1 mg  1 mg Oral TID PRN Christeen DouglasBeasley, Bethany, MD   1 mg at 05/10/18 2327   . calcium carbonate (TUMS - dosed in mg elemental calcium) chewable tablet 200 mg of elemental calcium  1 tablet Oral 5 X Daily PRN Christeen DouglasBeasley, Bethany, MD   200 mg of elemental calcium at 05/12/18 1235  . ceFAZolin (ANCEF) IVPB 2g/100 mL premix  2 g Intravenous Q8H Christeen DouglasBeasley, Bethany, MD 200 mL/hr at 05/12/18 1315 2 g at 05/12/18 1315  . docusate sodium (COLACE) capsule 100 mg  100 mg Oral BID Christeen DouglasBeasley, Bethany, MD   100 mg at 05/12/18 0941  . feeding supplement (ENSURE ENLIVE) (ENSURE ENLIVE) liquid 237 mL  237 mL Oral TID BM Merwyn KatosSimonds, David B, MD   237 mL at 05/12/18 1315  . heparin injection 5,000 Units  5,000 Units Subcutaneous Q8H Merwyn KatosSimonds, David B, MD   5,000 Units at 05/12/18 1313  . HYDROmorphone (DILAUDID) injection 0.5 mg  0.5 mg Intravenous Q4H PRN Merwyn KatosSimonds, David B, MD   0.5 mg at 05/12/18 0132  . lidocaine (XYLOCAINE) 5 % ointment   Topical BID PRN Merwyn KatosSimonds, David B, MD      . Melatonin TABS 5 mg  5 mg Oral QHS PRN Tukov-Yual, Magdalene S, NP   5 mg at 05/08/18 0019  . menthol-cetylpyridinium (CEPACOL) lozenge 3 mg  1 lozenge Oral Q2H PRN Crista ElliotBell, Eugene D III, MD      . mirabegron  ER (MYRBETRIQ) tablet 50 mg  50 mg Oral Daily Vanna Scotland, MD   50 mg at 05/12/18 0941  . mirtazapine (REMERON) tablet 15 mg  15 mg Oral QHS Clapacs, Jackquline Denmark, MD   15 mg at 05/11/18 2149  . nystatin (MYCOSTATIN) 100000 UNIT/ML suspension 500,000 Units  5 mL Oral QID Conforti, John, DO   500,000 Units at 05/12/18 1313  . ondansetron (ZOFRAN) injection 4 mg  4 mg Intravenous Q6H PRN Ray Church III, MD   4 mg at 05/07/18 2052  . opium-belladonna (B&O SUPPRETTES) 16.2-60 MG suppository 1 suppository  1 suppository Rectal Q8H PRN Rene Paci, MD   1 suppository at 05/12/18 1123  . oxyCODONE-acetaminophen (PERCOCET/ROXICET) 5-325 MG per tablet 1-2 tablet  1-2 tablet Oral Q6H PRN Christeen Douglas, MD   2 tablet at 05/12/18 0504  . pantoprazole (PROTONIX) EC tablet 40 mg  40 mg Oral Daily Christeen Douglas,  MD   40 mg at 05/12/18 1313  . phenazopyridine (PYRIDIUM) tablet 200 mg  200 mg Oral Q8H PRN Rene Paci, MD   200 mg at 05/11/18 0004  . phenol (CHLORASEPTIC) mouth spray 1 spray  1 spray Mouth/Throat PRN Merwyn Katos, MD      . polyethylene glycol (MIRALAX / GLYCOLAX) packet 17 g  17 g Oral Daily Christeen Douglas, MD   17 g at 05/12/18 0941  . potassium chloride (KLOR-CON) packet 20 mEq  20 mEq Oral BID Cherylann Ratel, Munsoor, MD   20 mEq at 05/12/18 1313  . promethazine (PHENERGAN) injection 12.5-25 mg  12.5-25 mg Intravenous Q6H PRN Tukov-Yual, Magdalene S, NP   25 mg at 05/03/18 0000  . sodium chloride flush (NS) 0.9 % injection 10-40 mL  10-40 mL Intracatheter Q12H Eugenie Norrie, NP   10 mL at 05/12/18 0941  . sodium chloride flush (NS) 0.9 % injection 10-40 mL  10-40 mL Intracatheter PRN Eugenie Norrie, NP   20 mL at 05/11/18 1019     Objective: Vital signs in last 24 hours: Temp:  [97.5 F (36.4 C)-98.2 F (36.8 C)] 98.2 F (36.8 C) (06/22 1314) Pulse Rate:  [109-116] 110 (06/22 1314) Resp:  [18-20] 18 (06/22 1314) BP: (112-131)/(56-69) 112/56 (06/22 1314) SpO2:  [99 %-100 %] 100 % (06/22 1314)  Intake/Output from previous day: 06/21 0701 - 06/22 0700 In: 29562 [P.O.:720] Out: 31175 [Urine:31175] Intake/Output this shift: Total I/O In: 3000 [Other:3000] Out: 6400 [Urine:6400]   Physical Exam  Awake, alert and oriented today Abdomen soft, nondistended.  Lower abdominal incision healing well. Foley in place clear urine without obvious clots Right nephrostomy draining orange tinged urine. Improving lower extremity anasarca   Lab Results:  Recent Labs    05/10/18 0452 05/11/18 0406 05/12/18 0517  WBC 20.4*  --  18.1*  HGB 7.0* 9.2* 9.0*  HCT 20.5* 25.6* 26.1*  PLT 370  --  319   BMET Recent Labs    05/11/18 1548 05/12/18 0517  NA 127* 128*  K 3.8 3.3*  CL 91* 93*  CO2 24 24  GLUCOSE 135* 142*  BUN 38* 31*  CREATININE 1.19* 1.09*   CALCIUM 8.0* 8.0*    Studies/Results: Ir Nephrostogram Right Thru Existing Access  Result Date: 05/10/2018 INDICATION: Thermal bladder injury with hematuria requiring continuous bladder irrigation. Renal insufficiency with right hydronephrosis, requiring right percutaneous nephrostomy catheter placement 04/30/2018. Antegrade nephrostogram requested in preparation for catheter removal. EXAM: ANTEGRADE NEPHROSTOGRAM THROUGH EXISTING CATHETER COMPARISON:  CT 05/02/2018 MEDICATIONS: None indicated ANESTHESIA/SEDATION:  None required CONTRAST:  Isovue-300 10 mL-administered into the collecting system(s) PROCEDURE: Informed written consent was obtained from the patient after a thorough discussion of the procedural risks, benefits and alternatives. All questions were addressed. A timeout was performed prior to the initiation of the procedure. Scout images were obtained. Gentle hand injection of contrast through the right nephrostomy catheter was performed under fluoroscopy. Representative images were obtained. Findings were telephoned to Dr. Apolinar Junes. The catheter was flushed and capped. FLUOROSCOPY TIME:  0.4 minute; 65 mGy COMPLICATIONS: None immediate. IMPRESSION: 1. Injection demonstrates decompression of the right renal collecting system, wide patency of the ureter, flow into the decompressed urinary bladder. After telephone discussion of the findings with Dr. Apolinar Junes, the catheter was capped externally to allow internal drainage, while maintaining percutaneous nephrostomy access. Electronically Signed   By: Corlis Leak M.D.   On: 05/10/2018 16:19   Ct Angio Abd/pel W/ And/or W/o  Result Date: 05/12/2018 CLINICAL DATA:  Recent hysterectomy. History of blood clots. Evaluate for portal vein thrombosis. EXAM: CT ANGIOGRAPHY ABDOMEN AND PELVIS WITH CONTRAST AND WITHOUT CONTRAST TECHNIQUE: Multidetector CT imaging of the abdomen and pelvis was performed using the standard protocol during bolus administration of  intravenous contrast. Multiplanar reconstructed images and MIPs were obtained and reviewed to evaluate the vascular anatomy. CONTRAST:  ISOVUE-370 IOPAMIDOL (ISOVUE-370) INJECTION 76% COMPARISON:  CT abdomen and pelvis - 05/02/2018; 04/30/2018; 04/28/2018 FINDINGS: VASCULAR Aorta: No significant atherosclerotic plaque within normal caliber abdominal aorta. Celiac: Widely patent though congenitally diminutive as the common hepatic artery is noted to arise from the proximal SMA. SMA: Widely patent without hemodynamically significant stenosis. As above, there is a replaced common hepatic artery arising from the proximal SMA. This tributaries the SMA appear widely patent without discrete intraluminal filling defect to suggest distal embolism. Renals: Solitary bilaterally. Widely patent without hemodynamically significant stenosis. No vessel irregularity to suggest FMD. IMA: Widely patent without hemodynamically significant narrowing. 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 The left pelvic arterial system is of normal caliber and widely patent without hemodynamically significant stenosis. Proximal Outflow: There is a AVM within the proximal aspect the left thigh with direct communication between the proximal aspect of the left superficial femoral artery and adjacent left superficial femoral vein (representative images 228 - 242, series 4). The AVM begins approximately 4 cm caudal to the left common femoral artery bifurcation. The right common, superficial and deep femoral arteries atretic though appear patent. Veins: AVM within the proximal aspect the left thigh with direct communication between the proximal aspect of the left superficial femoral artery and the adjacent left superficial femoral vein  (representative images 228 - 242, series 4). 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 There is a minimal amount of nonocclusive thrombus within the proximal/mid aspect the left internal iliac vein (image 173, series 4). As above, there are multiple thrombosed gonadal varicosities within the lower pelvis bilaterally. In particular, there is apparent complete thrombosis of a markedly hypertrophied left gonadal vein  to the level of the confluence with the left renal vein (representative coronal image 98, series 12). There is nonocclusive DVT within the proximal and mid aspects of the right superficial femoral vein (images 247 and 275, series 4). The right common, external and internal iliac veins are atretic. 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 Review of the MIP images confirms the above findings. NON-VASCULAR Lower chest: Limited visualization of the lower thorax demonstrates minimal subsegmental atelectasis within the image right lower lobe, minimally improved compared to the 05/02/2018 examination. Minimal dependent subpleural ground-glass atelectasis. Borderline cardiomegaly.  No pericardial effusion. Hepatobiliary: Normal hepatic contour. No discrete hepatic lesions. Normal appearance of the gallbladder given underdistention. No radiopaque gallstones. No intra extrahepatic bili duct dilatation. No ascites. Pancreas: Normal appearance of the pancreas Spleen: Normal appearance of the spleen. Note is made of a small splenule. Adrenals/Urinary Tract: There is symmetric enhancement and excretion of the bilateral kidneys. Read demonstrated mild  bilateral pelvicaliectasis this bright appropriate positioning of the right-sided percutaneous nephrostomy catheter and left-sided double-J ureteral stent. No definite renal stones. No perinephric stranding. Normal appearance the bilateral adrenal glands. The urinary bladder is underdistended with a Foley catheter. Stomach/Bowel: Moderate colonic stool burden without evidence of enteric obstruction. Normal appearance of the terminal ileum and retrocecal appendix. No pneumoperitoneum, pneumatosis or portal venous gas. Lymphatic: No bulky retroperitoneal, mesenteric or pelvic lymphadenopathy. Reproductive: Post hysterectomy however there is an approximately 7.4 x 3.6 x 4.5 cm (axial image 192, series 4, sagittal image 120, series 13) suspected hematoma adjacent to the vaginal cuff. No discrete adnexal lesion. Note is made of thrombosis of multiple bilateral hypertrophied gonadal/adnexal varicosities (representative images 66, 77 and 80, series 5). No definitive discrete adnexal lesion. There is a minimal amount of likely expected fluid within the pelvic cul-de-sac. No definable/drainable fluid collection. Other: Diffuse body wall anasarca. Musculoskeletal: No acute or aggressive osseous abnormalities. Bilateral facet degenerative change within the lower lumbar spine. IMPRESSION: VASCULAR 1. 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 2. 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 3. 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 Of importance, there is occlusive thrombosis of a markedly hypertrophied left gonadal vein to the level of its confluence with the left renal vein which likely previously served as the predominant venous return from the bilateral lower extremities in the setting of IVC atresia. 4. Nonocclusive DVT within the  proximal and mid aspects of the right superficial femoral vein. 5. Patent though atretic right lower extremity inflow and proximal outflow arterial system, congenital versus acquired in the setting of the contralateral proximal left thigh high-flow AVM 6. No evidence of portal venous thrombosis. NON-VASCULAR 1. Suspected 7.4 cm hematoma adjacent to the vaginal cuff. No definable/drainable fluid collections within the abdomen or pelvis. 2. Diffuse body wall anasarca. Critical Value/emergent results were called by telephone at the time of interpretation on 05/12/2018 at 9:06 am to Dr. Evie Lacks, who verbally acknowledged these results. Electronically Signed   By: Simonne Come M.D.   On: 05/12/2018 09:22   Renal ultrasound personally reviewed.  Assessment/ Plan:  1.  Gross hematuria/ bladder pain- resolved  2.  Acute kidney injury-Improving.  UOP excellent  3. Foley/ nephrostomy /stent- we will discontinue foley today. Continue to trend creatinine. We will attempt to clamp the neph tube tomorrow.     LOS: 15  days    [name] 05/12/2018

## 2018-05-12 NOTE — Progress Notes (Signed)
Patient is improving but tired of the extended hospital stay. Mother was tired after keeping the night vigil. Patient recounted her hospital stay and hopes to go home. Visit was truncated by her doctor's visit.

## 2018-05-12 NOTE — Progress Notes (Signed)
OT Cancellation Note  Patient Details Name: Amy SidleGinger B Frazee MRN: [id number] DOB: 03/26/1976   Cancelled Treatment:    Reason Eval/Treat Not Completed: Medical issues which prohibited therapy. Pt pending CT results and updated plan of care per vascular surgery. Will re-attempt OT tx at later date/time as pt is medically appropriate pending updated plan of care re: anticoagulation/vascular.  Richrd PrimeJamie Stiller, MPH, MS, OTR/L ascom [phone] 05/12/18, 12:26 PM

## 2018-05-12 NOTE — Progress Notes (Signed)
Vascular Surgery  Doing much better. Lower extremity edema/swelling significantly improved. Notes minimal pain.  HgB: 9- stable Urine clear.  Continue leg elevation ACE wrapping from toes to upper thighs.  Monitor Urine Monitor HgB Replace electrolytes.  Will monitor to ensure HgB remains stable before starting anticoagulation.

## 2018-05-13 DIAGNOSIS — N179 Acute kidney failure, unspecified: Secondary | ICD-10-CM

## 2018-05-13 DIAGNOSIS — R310 Gross hematuria: Secondary | ICD-10-CM

## 2018-05-13 DIAGNOSIS — N1330 Unspecified hydronephrosis: Secondary | ICD-10-CM

## 2018-05-13 LAB — COMPREHENSIVE METABOLIC PANEL
ALT: 25 U/L (ref 14–54)
ANION GAP: 10 (ref 5–15)
AST: 57 U/L — ABNORMAL HIGH (ref 15–41)
Albumin: 2.5 g/dL — ABNORMAL LOW (ref 3.5–5.0)
Alkaline Phosphatase: 236 U/L — ABNORMAL HIGH (ref 38–126)
BUN: 21 mg/dL — ABNORMAL HIGH (ref 6–20)
CHLORIDE: 95 mmol/L — AB (ref 101–111)
CO2: 25 mmol/L (ref 22–32)
Calcium: 8 mg/dL — ABNORMAL LOW (ref 8.9–10.3)
Creatinine, Ser: 0.95 mg/dL (ref 0.44–1.00)
Glucose, Bld: 146 mg/dL — ABNORMAL HIGH (ref 65–99)
POTASSIUM: 3.7 mmol/L (ref 3.5–5.1)
SODIUM: 130 mmol/L — AB (ref 135–145)
Total Bilirubin: 0.7 mg/dL (ref 0.3–1.2)
Total Protein: 6 g/dL — ABNORMAL LOW (ref 6.5–8.1)

## 2018-05-13 LAB — CBC
HCT: 24.5 % — ABNORMAL LOW (ref 35.0–47.0)
Hemoglobin: 8.3 g/dL — ABNORMAL LOW (ref 12.0–16.0)
MCH: 31 pg (ref 26.0–34.0)
MCHC: 33.9 g/dL (ref 32.0–36.0)
MCV: 91.4 fL (ref 80.0–100.0)
PLATELETS: 294 10*3/uL (ref 150–440)
RBC: 2.68 MIL/uL — ABNORMAL LOW (ref 3.80–5.20)
RDW: 14.8 % — AB (ref 11.5–14.5)
WBC: 14.9 10*3/uL — ABNORMAL HIGH (ref 3.6–11.0)

## 2018-05-13 LAB — GLUCOSE, CAPILLARY: GLUCOSE-CAPILLARY: 145 mg/dL — AB (ref 65–99)

## 2018-05-13 MED ORDER — ADULT MULTIVITAMIN W/MINERALS CH
1.0000 | ORAL_TABLET | Freq: Every day | ORAL | Status: DC
  Administered 2018-05-14 – 2018-05-19 (×5): 1 via ORAL
  Filled 2018-05-13 (×5): qty 1

## 2018-05-13 NOTE — Progress Notes (Signed)
Urology Consult Follow Up  Subjective: Patient doing better today. She denies any bladder spasms. Foley discontinued yesterday and patient is voiding without difficulty   Anti-infectives: Anti-infectives (From admission, onward)   Start     Dose/Rate Route Frequency Ordered Stop   05/10/18 1500  ceFAZolin (ANCEF) IVPB 2g/100 mL premix     2 g 200 mL/hr over 30 Minutes Intravenous Every 8 hours 05/10/18 1453     05/06/18 2200  meropenem (MERREM) 1 g in sodium chloride 0.9 % 100 mL IVPB  Status:  Discontinued     1 g 200 mL/hr over 30 Minutes Intravenous Every 12 hours 05/06/18 1305 05/07/18 0845   05/02/18 1000  meropenem (MERREM) 500 mg in sodium chloride 0.9 % 100 mL IVPB  Status:  Discontinued     500 mg 200 mL/hr over 30 Minutes Intravenous Every 12 hours 05/02/18 0911 05/06/18 1305   05/01/18 2200  piperacillin-tazobactam (ZOSYN) IVPB 3.375 g  Status:  Discontinued     3.375 g 12.5 mL/hr over 240 Minutes Intravenous Every 12 hours 05/01/18 1952 05/02/18 0838   04/30/18 2200  piperacillin-tazobactam (ZOSYN) IVPB 3.375 g  Status:  Discontinued     3.375 g 12.5 mL/hr over 240 Minutes Intravenous Every 12 hours 04/30/18 0833 05/01/18 1509   04/29/18 0330  piperacillin-tazobactam (ZOSYN) IVPB 3.375 g  Status:  Discontinued     3.375 g 12.5 mL/hr over 240 Minutes Intravenous Every 8 hours 04/29/18 0317 04/30/18 0833   04/27/18 0615  ceFAZolin (ANCEF) 2-4 GM/100ML-% IVPB    Note to Pharmacy:  Lorrene Reid   : cabinet override      04/27/18 0615 04/27/18 0746   04/27/18 0600  ceFAZolin (ANCEF) IVPB 2g/100 mL premix     2 g 200 mL/hr over 30 Minutes Intravenous On call to O.R. 04/26/18 2240 04/27/18 1142      Current Facility-Administered Medications  Medication Dose Route Frequency Provider Last Rate Last Dose  . ALPRAZolam Prudy Feeler) tablet 1 mg  1 mg Oral TID PRN Christeen Douglas, MD   1 mg at 05/13/18 1041  . calcium carbonate (TUMS - dosed in mg elemental calcium) chewable  tablet 200 mg of elemental calcium  1 tablet Oral 5 X Daily PRN Christeen Douglas, MD   200 mg of elemental calcium at 05/12/18 1235  . ceFAZolin (ANCEF) IVPB 2g/100 mL premix  2 g Intravenous Q8H Christeen Douglas, MD   Stopped at 05/13/18 1626  . docusate sodium (COLACE) capsule 100 mg  100 mg Oral BID Christeen Douglas, MD   100 mg at 05/13/18 1101  . feeding supplement (ENSURE ENLIVE) (ENSURE ENLIVE) liquid 237 mL  237 mL Oral TID BM Merwyn Katos, MD   237 mL at 05/13/18 1044  . heparin injection 5,000 Units  5,000 Units Subcutaneous Q8H Merwyn Katos, MD   5,000 Units at 05/13/18 1515  . HYDROmorphone (DILAUDID) injection 0.5 mg  0.5 mg Intravenous Q4H PRN Merwyn Katos, MD   0.5 mg at 05/13/18 1335  . lidocaine (XYLOCAINE) 5 % ointment   Topical BID PRN Merwyn Katos, MD      . Melatonin TABS 5 mg  5 mg Oral QHS PRN Tukov-Yual, Magdalene S, NP   5 mg at 05/08/18 0019  . menthol-cetylpyridinium (CEPACOL) lozenge 3 mg  1 lozenge Oral Q2H PRN Ray Church III, MD      . mirabegron [facility]) tablet 50 mg  50 mg Oral Daily Vanna Scotland, MD   50  mg at 05/13/18 1105  . mirtazapine (REMERON) tablet 15 mg  15 mg Oral QHS Clapacs, Jackquline Denmark, MD   15 mg at 05/12/18 2128  . [START ON 05/14/2018] multivitamin with minerals tablet 1 tablet  1 tablet Oral Daily Christeen Douglas, MD      . nystatin (MYCOSTATIN) 100000 UNIT/ML suspension 500,000 Units  5 mL Oral QID Conforti, John, DO   500,000 Units at 05/13/18 1626  . ondansetron (ZOFRAN) injection 4 mg  4 mg Intravenous Q6H PRN Ray Church III, MD   4 mg at 05/07/18 2052  . opium-belladonna (B&O SUPPRETTES) 16.2-60 MG suppository 1 suppository  1 suppository Rectal Q8H PRN Rene Paci, MD   1 suppository at 05/12/18 1123  . oxyCODONE-acetaminophen (PERCOCET/ROXICET) 5-325 MG per tablet 1-2 tablet  1-2 tablet Oral Q6H PRN Christeen Douglas, MD   1 tablet at 05/13/18 1515  . pantoprazole (PROTONIX) EC tablet 40 mg  40 mg Oral  Daily Christeen Douglas, MD   40 mg at 05/13/18 1102  . phenazopyridine (PYRIDIUM) tablet 200 mg  200 mg Oral Q8H PRN Rene Paci, MD   200 mg at 05/11/18 0004  . phenol (CHLORASEPTIC) mouth spray 1 spray  1 spray Mouth/Throat PRN Merwyn Katos, MD      . polyethylene glycol (MIRALAX / GLYCOLAX) packet 17 g  17 g Oral Daily Christeen Douglas, MD   17 g at 05/13/18 1055  . potassium chloride (KLOR-CON) packet 20 mEq  20 mEq Oral BID Cherylann Ratel, Munsoor, MD   20 mEq at 05/13/18 1102  . promethazine (PHENERGAN) injection 12.5-25 mg  12.5-25 mg Intravenous Q6H PRN Tukov-Yual, Magdalene S, NP   25 mg at 05/03/18 0000  . sodium chloride flush (NS) 0.9 % injection 10-40 mL  10-40 mL Intracatheter Q12H Eugenie Norrie, NP   10 mL at 05/13/18 1044  . sodium chloride flush (NS) 0.9 % injection 10-40 mL  10-40 mL Intracatheter PRN Eugenie Norrie, NP   20 mL at 05/11/18 1019     Objective: Vital signs in last 24 hours: Temp:  [98.3 F (36.8 C)-98.9 F (37.2 C)] 98.3 F (36.8 C) (06/23 1351) Pulse Rate:  [113-146] 113 (06/23 1351) Resp:  [16-20] 16 (06/23 1351) BP: (118-124)/(62-68) 122/63 (06/23 1351) SpO2:  [97 %-100 %] 97 % (06/23 1351)  Intake/Output from previous day: 06/22 0701 - 06/23 0700 In: 6240 [P.O.:240] Out: 9150 [Urine:9150] Intake/Output this shift: Total I/O In: 240 [P.O.:240] Out: 600 [Urine:600]   Physical Exam  Awake, alert and oriented today Abdomen soft, nondistended.  Lower abdominal incision healing well. Foley in place clear urine without obvious clots Right nephrostomy draining orange tinged urine. Improving lower extremity anasarca   Lab Results:  Recent Labs    05/12/18 0517 05/13/18 0513  WBC 18.1* 14.9*  HGB 9.0* 8.3*  HCT 26.1* 24.5*  PLT 319 294   BMET Recent Labs    05/12/18 0517 05/13/18 0513  NA 128* 130*  K 3.3* 3.7  CL 93* 95*  CO2 24 25  GLUCOSE 142* 146*  BUN 31* 21*  CREATININE 1.09* 0.95  CALCIUM 8.0* 8.0*     Studies/Results: Ct Angio Abd/pel W/ And/or W/o  Result Date: 05/12/2018 CLINICAL DATA:  Recent hysterectomy. History of blood clots. Evaluate for portal vein thrombosis. EXAM: CT ANGIOGRAPHY ABDOMEN AND PELVIS WITH CONTRAST AND WITHOUT CONTRAST TECHNIQUE: Multidetector CT imaging of the abdomen and pelvis was performed using the standard protocol during bolus administration of intravenous contrast. Multiplanar  reconstructed images and MIPs were obtained and reviewed to evaluate the vascular anatomy. CONTRAST:  ISOVUE-370 IOPAMIDOL (ISOVUE-370) INJECTION 76% COMPARISON:  CT abdomen and pelvis - 05/02/2018; 04/30/2018; 04/28/2018 FINDINGS: VASCULAR Aorta: No significant atherosclerotic plaque within normal caliber abdominal aorta. Celiac: Widely patent though congenitally diminutive as the common hepatic artery is noted to arise from the proximal SMA. SMA: Widely patent without hemodynamically significant stenosis. As above, there is a replaced common hepatic artery arising from the proximal SMA. This tributaries the SMA appear widely patent without discrete intraluminal filling defect to suggest distal embolism. Renals: Solitary bilaterally. Widely patent without hemodynamically significant stenosis. No vessel irregularity to suggest FMD. IMA: Widely patent without hemodynamically significant narrowing. 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 The left pelvic arterial system is of normal caliber and widely patent without hemodynamically significant stenosis. Proximal Outflow: There is a AVM within the proximal aspect the left thigh with direct communication between the proximal aspect of the left superficial femoral artery and adjacent left superficial femoral vein (representative images 228 - 242,  series 4). The AVM begins approximately 4 cm caudal to the left common femoral artery bifurcation. The right common, superficial and deep femoral arteries atretic though appear patent. Veins: AVM within the proximal aspect the left thigh with direct communication between the proximal aspect of the left superficial femoral artery and the adjacent left superficial femoral vein (representative images 228 - 242, series 4). 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 There is a minimal amount of nonocclusive thrombus within the proximal/mid aspect the left internal iliac vein (image 173, series 4). As above, there are multiple thrombosed gonadal varicosities within the lower pelvis bilaterally. In particular, there is apparent complete thrombosis of a markedly hypertrophied left gonadal vein to the level of the confluence with the left renal vein (representative coronal image 98, series 12). There is nonocclusive DVT within the proximal and mid aspects of the right superficial femoral vein (images 247 and 275, series 4). The right common, external and internal iliac veins are atretic. 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 Review of the MIP images confirms the above findings. NON-VASCULAR Lower chest: Limited visualization of the lower thorax demonstrates minimal subsegmental atelectasis within the image right lower lobe, minimally improved compared to the 05/02/2018 examination. Minimal dependent subpleural ground-glass atelectasis. Borderline cardiomegaly.  No pericardial effusion. Hepatobiliary: Normal hepatic contour. No discrete hepatic lesions.  Normal appearance of the gallbladder given underdistention. No radiopaque gallstones. No intra extrahepatic bili duct dilatation. No ascites. Pancreas: Normal appearance of the pancreas Spleen: Normal appearance of the spleen. Note is made of a small splenule. Adrenals/Urinary Tract: There is symmetric enhancement and excretion of the bilateral kidneys. Read demonstrated mild bilateral pelvicaliectasis this bright appropriate positioning of the right-sided percutaneous nephrostomy catheter and left-sided double-J ureteral stent. No definite renal stones. No perinephric stranding. Normal appearance the bilateral adrenal glands. The urinary bladder is underdistended with a Foley catheter. Stomach/Bowel: Moderate colonic stool  burden without evidence of enteric obstruction. Normal appearance of the terminal ileum and retrocecal appendix. No pneumoperitoneum, pneumatosis or portal venous gas. Lymphatic: No bulky retroperitoneal, mesenteric or pelvic lymphadenopathy. Reproductive: Post hysterectomy however there is an approximately 7.4 x 3.6 x 4.5 cm (axial image 192, series 4, sagittal image 120, series 13) suspected hematoma adjacent to the vaginal cuff. No discrete adnexal lesion. Note is made of thrombosis of multiple bilateral hypertrophied gonadal/adnexal varicosities (representative images 66, 77 and 80, series 5). No definitive discrete adnexal lesion. There is a minimal amount of likely expected fluid within the pelvic cul-de-sac. No definable/drainable fluid collection. Other: Diffuse body wall anasarca. Musculoskeletal: No acute or aggressive osseous abnormalities. Bilateral facet degenerative change within the lower lumbar spine. IMPRESSION: VASCULAR 1. 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 2. 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 3. 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 Of importance, there is occlusive thrombosis of a markedly hypertrophied left gonadal vein to the level of its confluence with the left renal vein which likely previously served as the predominant venous return from the bilateral lower extremities in the setting of IVC atresia. 4. Nonocclusive DVT within the proximal and mid aspects of the right superficial femoral vein. 5. Patent though atretic right lower extremity inflow and proximal outflow arterial system, congenital versus acquired in the setting of the contralateral proximal left thigh high-flow AVM 6. No evidence of portal venous thrombosis. NON-VASCULAR 1. Suspected 7.4 cm hematoma adjacent to the vaginal cuff. No definable/drainable fluid collections within the abdomen or pelvis. 2. Diffuse body wall anasarca. Critical Value/emergent results were called by telephone at the time of interpretation on 05/12/2018 at 9:06 am to Dr. Evie LacksEsco, who verbally acknowledged these results. Electronically Signed   By: Simonne ComeJohn  Watts M.D.   On: 05/12/2018 09:22   Renal ultrasound personally reviewed.  Assessment/ Plan:  1.  Gross hematuria/ bladder pain- resolved  2.  Acute kidney injury-Improving.  UOP excellent  3.  nephrostomy /stent-  We will attempt to clamp the neph tube tomorrow.     LOS: 16 days    Wilkie Ayeatrick Terrisa Curfman 05/13/2018

## 2018-05-13 NOTE — Progress Notes (Signed)
Nutrition Follow-up  DOCUMENTATION CODES:   Obesity unspecified  INTERVENTION:  Agree with regular diet.  Continue Ensure Enlive po TID, each supplement provides 350 kcal and 20 grams of protein. Encouraged patient to drink ALL 3 containers of Ensure daily.  Provide Magic cup TID with meals, each supplement provides 290 kcal and 9 grams of protein.  Provide daily MVI.  Encouraged patient to choose an adequate source of protein with each meal. Discussed which foods are good sources of protein and the options available on menu. In setting of early satiety discussed choosing small, frequent meals that are protein- and calorie-dense.  If patient is still unable to increase her intake of protein will need to consider placement of small-bore NGT for initiation of enteral nutrition. She has had very inadequate nutrition intake her entire admission.  NUTRITION DIAGNOSIS:   Inadequate oral intake related to acute illness(AKI, recent surgery) as evidenced by (On Clear Liquid Diet).  Intake remains inadequate though diet has been advanced.  GOAL:   Patient will meet greater than or equal to 90% of their needs  Progressing.  MONITOR:   PO intake, Diet advancement, I & O's, Labs  REASON FOR ASSESSMENT:   NPO/Clear Liquid Diet, Consult Other (Comment)(increase protein stores)  ASSESSMENT:   42 year old female with PMHx GERD, DVT, who on 6/7 underwent total hysterectomy, R salpingectomy, L salping-oophorectomy, lysis of adhesions, retroperitoneal dissection, pt with extensive blood loss, developed hemorrhagic shock requiring massive transfusion protocol. Developed hematuria, L hydronephrosis, R hydronephrosis, underwent cytoscopy 6/9 and L ureteral stent, 6/10 R nephrostomy tube.   -Patient initiated clear liquids on 6/12. -On 6/15 NGT was removed and diet was advanced to full liquids. -On 6/17 diet was advanced to regular and patient transferred out of ICU.  Met with patient and her  husband at bedside. Patient reports her appetite is improving, but is still not great. Her son has been bringing her gravy biscuits in the mornings. This morning she had part of her gravy biscuit, and then had some cereal in milk and orange juice. She did not eat any lunch yesterday and she cannot recall if she had any dinner yesterday. She drank one Ensure this morning but cannot recall if she had any yesterday afternoon. She denies any N/V or abdominal pain. She is experiencing early satiety, which is impacting her intake. Had another long discussion on importance of protein intake with patient and husband (had also discussed this on nutrition follow-up on 6/17). Discussed that right now her main source of protein is coming from the Ensure, and she is not drinking enough Ensure to even meet a significant amount of her protein needs. Patient is amenable to try to increase protein intake. She also reports she was able to ambulate some around her room today.  Medications reviewed and include: Colace, Remeron 15 mg QHS, pantoprazole, Miralax, potassium chloride 20 mEq BID, cefazolin.  Labs reviewed: CBG 145, Sodium 130, Chloride 95, BUN 21.  I/O: 9150 mL UOP yesterday (4.1 mL/kg/hr)  No subsequent weight since admission to trend.  Diet Order:   Diet Order           Diet regular Room service appropriate? Yes; Fluid consistency: Thin  Diet effective now          EDUCATION NEEDS:   No education needs have been identified at this time  Skin:  Skin Assessment: Skin Integrity Issues: Skin Integrity Issues:: Incisions Incisions: closed incisions to abdomen, vagina, and perineum  Last BM:  05/13/2018 -  small type 6/type 7  Height:   Ht Readings from Last 1 Encounters:  05/04/18 '5\' 5"'  (1.651 m)    Weight:   Wt Readings from Last 1 Encounters:  05/03/18 205 lb (93 kg)    Ideal Body Weight:  56.81 kg  BMI:  Body mass index is 34.11 kg/m.  Estimated Nutritional Needs:   Kcal:   1991-2150 (MSJ x1.2-1.35)  Protein:  100-120 grams  Fluid:  UOP +570m  LWilley Blade MS, RD, LDN Office: [phone]: [phone] Hours/Weekend Pager: [phone]
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
## 2018-05-13 NOTE — Progress Notes (Signed)
Patient ID: [name], female   DOB: [birth year], 42 y.o.   MRN: [id number] 15 Days Post-Op  Procedure(s):  1. 04/28/18: ATTEMPTED LAPAROSCOPIC HYSTERECTOMY CONVERTED TO SUPRACERVICAL ABDOMINAL HYSTERECTOMY (N/A) SALPINGO OOPHORECTOMY (Left) CYSTOSCOPY (N/A) UNILATERAL SALPINGECTOMY (Right) LAPAROSCOPIC LYSIS OF ADHESIONS, RETROPERITONEAL DISSECTION  2. 04/29/18: CYSTOSCOPY WITH RETROGRADE PYELOGRAM/Left URETERAL STENT PLACEMENT, ureteroscopy (Bilateral)    Subjective: Feeling better after vascular surgery wrapped her legs and the swelling has improved. 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 - Nutrition consult by vascular to try to raise albumin, two Ensure's a day  - On Ancef for positive blood culture, likely contaminant - OOB today, tolerating regular diet - s/p Foley cath, with good urine output overnight    Objective: Vital signs in last 24 hours: Temp:  [98.2 F (36.8 C)-98.9 F (37.2 C)] 98.9 F (37.2 C) (06/23 0429) Pulse Rate:  [110-123] 117 (06/23 1026) Resp:  [18-20] 18 (06/23 0429) BP: (112-124)/(56-68) 124/68 (06/23 1026) SpO2:  [100 %] 100 % (06/23 0429)  Intake/Output  Intake/Output Summary (Last 24 hours) at 05/13/2018 1130 Last data filed at 05/13/2018 1027 Gross per 24 hour  Intake 3480 ml  Output 6390 ml  Net -2910 ml    Physical Exam:  General: Oriented and responsive, appears more cheerful CV: Regular rhythm, no murmurs, regular rate Lungs: CTAB GI: Non-tender, non-distended. Normal bowel sounds. Ecchymosis over mons improving, and lap incisions clean and dry. Pitting edema to umbilicus.  Incisions: Clean and dry Urine: Pink, Foley in place Extremities: Nontender, no erythema, LE wrapped. Fingers, hands and arms are no longer edematous.  Lab Results: Recent Labs    05/11/18 0406 05/12/18 0517 05/13/18 0513  HGB 9.2* 9.0* 8.3*  HCT 25.6* 26.1* 24.5*  WBC  --  18.1* 14.9*  PLT  --  319 294                  Results for orders placed or performed during the hospital encounter of 04/27/18 (from the past 24 hour(s))  CBC     Status: Abnormal   Collection Time: 05/13/18  5:13 AM  Result Value Ref Range   WBC 14.9 (H) 3.6 - 11.0 K/uL   RBC 2.68 (L) 3.80 - 5.20 MIL/uL   Hemoglobin 8.3 (L) 12.0 - 16.0 g/dL   HCT 40.9 (L) 81.1 - 91.4 %   MCV 91.4 80.0 - 100.0 fL   MCH 31.0 26.0 - 34.0 pg   MCHC 33.9 32.0 - 36.0 g/dL   RDW 78.2 (H) 95.6 - 21.3 %   Platelets 294 150 - 440 K/uL  Comprehensive metabolic panel     Status: Abnormal   Collection Time: 05/13/18  5:13 AM  Result Value Ref Range   Sodium 130 (L) 135 - 145 mmol/L   Potassium 3.7 3.5 - 5.1 mmol/L   Chloride 95 (L) 101 - 111 mmol/L   CO2 25 22 - 32 mmol/L   Glucose, Bld 146 (H) 65 - 99 mg/dL   BUN 21 (H) 6 - 20 mg/dL   Creatinine, Ser 0.86 0.44 - 1.00 mg/dL   Calcium 8.0 (L) 8.9 - 10.3 mg/dL   Total Protein 6.0 (L) 6.5 - 8.1 g/dL   Albumin 2.5 (L) 3.5 - 5.0 g/dL   AST 57 (H) 15 - 41 U/L   ALT 25 14 - 54 U/L   Alkaline Phosphatase 236 (H) 38 - 126 U/L   Total Bilirubin 0.7 0.3 - 1.2 mg/dL   GFR calc non  Af Amer >60 >60 mL/min   GFR calc Af Amer >60 >60 mL/min   Anion gap 10 5 - 15  Glucose, capillary     Status: Abnormal   Collection Time: 05/13/18 11:10 AM  Result Value Ref Range   Glucose-Capillary 145 (H) 65 - 99 mg/dL   Comment 1 Notify RN     Assessment/Plan: 15 Days Post-Op        Procedure(s): 1. 04/28/18: ATTEMPTED LAPAROSCOPIC HYSTERECTOMY CONVERTED TO SUPRACERVICAL ABDOMINAL HYSTERECTOMY (N/A) SALPINGO OOPHORECTOMY (Left) CYSTOSCOPY (N/A) UNILATERAL SALPINGECTOMY (Right) LAPAROSCOPIC LYSIS OF ADHESIONS, RETROPERITONEAL DISSECTION   2. 04/29/18: CYSTOSCOPY WITH RETROGRADE PYELOGRAM/Left URETERAL STENT PLACEMENT, ureteroscopy (Bilateral)  IMP: Generalized edema Anemia ATN - resolved  PLAN/REC:  - Vascular surgery now on board for management of her edema and vena cava occlusion with the  resultant sequelae. I have ordered abdominal binder to see if that helps. Encourage both ambulation and Trendelenburg as tolerated.  - Nutrition consult pending  - Anemia- monitoring h/h, s/p transfusion. Hgb now 8.3 down from 9.2 with transfusion.   - Ancef for 1 of 4 culture positive. Final blood culture and urine culture pending. Central line in place, culture pending. Afebrile, feels well  Appreciate nephro recs S/p psychiatry consult S/p Consult to pastoral care Continue physical therapy. PT has determine to be discharged home with home health, and case management working on that with her and family. Regular diet Consider d/c abx and central line tomorrow,    Christeen DouglasBethany Furkan Keenum, MD Cell phone: [phone]   LOS: 16 days   Christeen DouglasBethany Lubna Stegeman 05/13/2018, 11:30 AMPatient ID: Amy SidleGinger B Chism, female   DOB: 10/20/1976, 42 y.o.   MRN: [id number]

## 2018-05-13 NOTE — Progress Notes (Signed)
[facility], [place] 05/13/18  Subjective:  Cr down to 0.95. Na remains low but currently 130. Foley catheter removed.  CBI has been stopped.   Objective:  Vital signs in last 24 hours:  Temp:  [98.2 F (36.8 C)-98.9 F (37.2 C)] 98.9 F (37.2 C) (06/23 0429) Pulse Rate:  [110-146] 146 (06/23 1204) Resp:  [18-20] 18 (06/23 0429) BP: (112-124)/(56-68) 124/68 (06/23 1026) SpO2:  [99 %-100 %] 99 % (06/23 1204)  Weight change:  Filed Weights   05/03/18 0800  Weight: 93 kg (205 lb)    Intake/Output:    Intake/Output Summary (Last 24 hours) at 05/13/2018 1214 Last data filed at 05/13/2018 1027 Gross per 24 hour  Intake 480 ml  Output 2750 ml  Net -2270 ml     Physical Exam: General: laying in bed  HEENT Moist oral mucus membranes  Neck supple  Pulm/lungs CTAB, normal effort  CVS/Heart S1S2 no rubs  Abdomen:  Mild distension, BS present  Extremities: 3+ b/l LE edema, bilateral LE's wrapped  Neurologic: Alert, oriented x3, follows commands  Skin: No acute rashes   rt nephrostomy in place, foley removed.        Basic Metabolic Panel:  Recent Labs  Lab 05/07/18 0556 05/08/18 0630 05/10/18 0452 05/11/18 1548 05/12/18 0517 05/13/18 0513  NA 130* 129* 128* 127* 128* 130*  K 3.7 3.9 4.2 3.8 3.3* 3.7  CL 92* 92* 91* 91* 93* 95*  CO2 25 26 24 24 24 25   GLUCOSE 131* 130* 119* 135* 142* 146*  BUN 57* 55* 44* 38* 31* 21*  CREATININE 1.67* 1.43* 1.14* 1.19* 1.09* 0.95  CALCIUM 7.5* 8.0* 7.9* 8.0* 8.0* 8.0*  MG 2.2  --   --   --   --   --   PHOS 3.2  --   --   --   --   --      CBC: Recent Labs  Lab 05/07/18 0556 05/09/18 1300 05/10/18 0452 05/11/18 0406 05/12/18 0517 05/13/18 0513  WBC 17.0* 22.9* 20.4*  --  18.1* 14.9*  HGB 7.7* 7.2* 7.0* 9.2* 9.0* 8.3*  HCT 22.8* 21.6* 20.5* 25.6* 26.1* 24.5*  MCV 90.3 90.3 89.8  --  89.6 91.4  PLT 332 398 370  --  319 294     No results found for: HEPBSAG, HEPBSAB,  HEPBIGM    Microbiology:  Recent Results (from the past 240 hour(s))  Urine Culture     Status: None   Collection Time: 05/09/18  4:30 PM  Result Value Ref Range Status   Specimen Description   Final    URINE, CATHETERIZED Performed at [facility], [address], [place], [place] [address]    Special Requests   Final    Normal Performed at [facility], [address], [place], [place] [address]    Culture   Final    NO GROWTH Performed at [facility], [address] [place], [place] [address]    Report Status 05/10/2018 FINAL  Final  CULTURE, BLOOD (ROUTINE X 2) w Reflex to ID Panel     Status: Abnormal   Collection Time: 05/09/18  6:02 PM  Result Value Ref Range Status   Specimen Description   Final    BLOOD LEFT HAND Performed at [facility], [address] [place], [place] [address]    Special Requests   Final    BOTTLES DRAWN AEROBIC AND ANAEROBIC Blood Culture adequate volume Performed at  [facility], [address] [place], [place] [address]    Culture  Setup Time   Final    AEROBIC BOTTLE ONLY GRAM POSITIVE COCCI CRITICAL RESULT CALLED TO, READ BACK BY AND VERIFIED WITH: LISA KLUTTZ 05/10/18 1433 KLW    Culture (A)  Final    STAPHYLOCOCCUS SPECIES (COAGULASE NEGATIVE) THE SIGNIFICANCE OF ISOLATING THIS ORGANISM FROM A SINGLE SET OF BLOOD CULTURES WHEN MULTIPLE SETS ARE DRAWN IS UNCERTAIN. PLEASE NOTIFY THE MICROBIOLOGY DEPARTMENT WITHIN ONE WEEK IF SPECIATION AND SENSITIVITIES ARE REQUIRED. Performed at [facility], [address] [place], [place] [address]    Report Status 05/12/2018 FINAL  Final  Blood Culture ID Panel (Reflexed)     Status: Abnormal   Collection Time: 05/09/18  6:02 PM  Result Value Ref Range Status   Enterococcus species NOT DETECTED NOT DETECTED Final   Listeria monocytogenes NOT DETECTED NOT DETECTED Final   Staphylococcus species DETECTED (A) NOT DETECTED Final    Comment:  Methicillin (oxacillin) susceptible coagulase negative staphylococcus. Possible blood culture contaminant (unless isolated from more than one blood culture draw or clinical case suggests pathogenicity). No antibiotic treatment is indicated for blood  culture contaminants. CRITICAL RESULT CALLED TO, READ BACK BY AND VERIFIED WITH: LISA KLUTTZ 05/10/18 1433 KLW    Staphylococcus aureus NOT DETECTED NOT DETECTED Final   Methicillin resistance NOT DETECTED NOT DETECTED Final   Streptococcus species NOT DETECTED NOT DETECTED Final   Streptococcus agalactiae NOT DETECTED NOT DETECTED Final   Streptococcus pneumoniae NOT DETECTED NOT DETECTED Final   Streptococcus pyogenes NOT DETECTED NOT DETECTED Final   Acinetobacter baumannii NOT DETECTED NOT DETECTED Final   Enterobacteriaceae species NOT DETECTED NOT DETECTED Final   Enterobacter cloacae complex NOT DETECTED NOT DETECTED Final   Escherichia coli NOT DETECTED NOT DETECTED Final   Klebsiella oxytoca NOT DETECTED NOT DETECTED Final   Klebsiella pneumoniae NOT DETECTED NOT DETECTED Final   Proteus species NOT DETECTED NOT DETECTED Final   Serratia marcescens NOT DETECTED NOT DETECTED Final   Haemophilus influenzae NOT DETECTED NOT DETECTED Final   Neisseria meningitidis NOT DETECTED NOT DETECTED Final   Pseudomonas aeruginosa NOT DETECTED NOT DETECTED Final   Candida albicans NOT DETECTED NOT DETECTED Final   Candida glabrata NOT DETECTED NOT DETECTED Final   Candida krusei NOT DETECTED NOT DETECTED Final   Candida parapsilosis NOT DETECTED NOT DETECTED Final   Candida tropicalis NOT DETECTED NOT DETECTED Final    Comment: Performed at [facility], [address] [place], [place] [address]  CULTURE, BLOOD (ROUTINE X 2) w Reflex to ID Panel     Status: None (Preliminary result)   Collection Time: 05/09/18  7:00 PM  Result Value Ref Range Status   Specimen Description BLOOD RIGHT HAND  Final   Special Requests   Final    BOTTLES  DRAWN AEROBIC AND ANAEROBIC Blood Culture adequate volume   Culture   Final    NO GROWTH 4 DAYS Performed at [facility], [address] [place], [place] [address]    Report Status PENDING  Incomplete  CULTURE, BLOOD (ROUTINE X 2) w Reflex to ID Panel     Status: None (Preliminary result)   Collection Time: 05/10/18  7:10 PM  Result Value Ref Range Status   Specimen Description BLOOD LINE DRAW PER MD  Final   Special Requests   Final    BOTTLES DRAWN AEROBIC AND ANAEROBIC Blood Culture results may not be optimal due to  an excessive volume of blood received in culture bottles   Culture   Final    NO GROWTH 3 DAYS Performed at [facility], [address] [place], [place] [address]    Report Status PENDING  Incomplete    Coagulation Studies: No results for input(s): LABPROT, INR in the last 72 hours.  Urinalysis: Recent Labs    05/10/18 1823  COLORURINE RED*  LABSPEC 1.020  PHURINE TEST NOT REPORTED DUE TO COLOR INTERFERENCE OF URINE PIGMENT  GLUCOSEU TEST NOT REPORTED DUE TO COLOR INTERFERENCE OF URINE PIGMENT*  HGBUR TEST NOT REPORTED DUE TO COLOR INTERFERENCE OF URINE PIGMENT*  BILIRUBINUR TEST NOT REPORTED DUE TO COLOR INTERFERENCE OF URINE PIGMENT*  KETONESUR TEST NOT REPORTED DUE TO COLOR INTERFERENCE OF URINE PIGMENT*  PROTEINUR TEST NOT REPORTED DUE TO COLOR INTERFERENCE OF URINE PIGMENT*  NITRITE TEST NOT REPORTED DUE TO COLOR INTERFERENCE OF URINE PIGMENT*  LEUKOCYTESUR TEST NOT REPORTED DUE TO COLOR INTERFERENCE OF URINE PIGMENT*      Imaging: Ct Angio Abd/pel W/ And/or W/o  Result Date: 05/12/2018 CLINICAL DATA:  Recent hysterectomy. History of blood clots. Evaluate for portal vein thrombosis. EXAM: CT ANGIOGRAPHY ABDOMEN AND PELVIS WITH CONTRAST AND WITHOUT CONTRAST TECHNIQUE: Multidetector CT imaging of the abdomen and pelvis was performed using the standard protocol during bolus administration of intravenous contrast. Multiplanar  reconstructed images and MIPs were obtained and reviewed to evaluate the vascular anatomy. CONTRAST:  ISOVUE-370 IOPAMIDOL (ISOVUE-370) INJECTION 76% COMPARISON:  CT abdomen and pelvis - 05/02/2018; 04/30/2018; 04/28/2018 FINDINGS: VASCULAR Aorta: No significant atherosclerotic plaque within normal caliber abdominal aorta. Celiac: Widely patent though congenitally diminutive as the common hepatic artery is noted to arise from the proximal SMA. SMA: Widely patent without hemodynamically significant stenosis. As above, there is a replaced common hepatic artery arising from the proximal SMA. This tributaries the SMA appear widely patent without discrete intraluminal filling defect to suggest distal embolism. Renals: Solitary bilaterally. Widely patent without hemodynamically significant stenosis. No vessel irregularity to suggest FMD. IMA: Widely patent without hemodynamically significant narrowing. 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 The left pelvic arterial system is of normal caliber and widely patent without hemodynamically significant stenosis. Proximal Outflow: There is a AVM within the proximal aspect the left thigh with direct communication between the proximal aspect of the left superficial femoral artery and adjacent left superficial femoral vein (representative images 228 - 242, series 4). The AVM begins approximately 4 cm caudal to the left common femoral artery bifurcation. The right common, superficial and deep femoral arteries atretic though appear patent. Veins: AVM within the proximal aspect the left thigh with direct communication between the proximal aspect of the left superficial femoral artery and the adjacent left superficial femoral vein (representative images 228 - 242, series 4).  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 There is a minimal amount of nonocclusive thrombus within the proximal/mid aspect the left internal iliac vein (image 173, series 4). As above, there  are multiple thrombosed gonadal varicosities within the lower pelvis bilaterally. In particular, there is apparent complete thrombosis of a markedly hypertrophied left gonadal vein to the level of the confluence with the left renal vein (representative coronal image 98, series 12). There is nonocclusive DVT within the proximal and mid aspects of the right superficial femoral vein (images 247 and 275, series 4). The right common, external and internal iliac veins are atretic. 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 Review of the MIP images confirms the above findings. NON-VASCULAR Lower chest: Limited visualization of the lower thorax demonstrates minimal subsegmental atelectasis within the image right lower lobe, minimally improved compared to the 05/02/2018 examination. Minimal dependent subpleural ground-glass atelectasis. Borderline cardiomegaly.  No pericardial effusion. Hepatobiliary: Normal hepatic contour. No discrete hepatic lesions. Normal appearance of the gallbladder given underdistention. No radiopaque gallstones. No intra extrahepatic bili duct dilatation. No ascites. Pancreas: Normal appearance of the pancreas Spleen: Normal appearance of the spleen. Note is made of a small splenule. Adrenals/Urinary Tract: There is symmetric enhancement and excretion of the bilateral kidneys. Read demonstrated mild bilateral pelvicaliectasis this bright  appropriate positioning of the right-sided percutaneous nephrostomy catheter and left-sided double-J ureteral stent. No definite renal stones. No perinephric stranding. Normal appearance the bilateral adrenal glands. The urinary bladder is underdistended with a Foley catheter. Stomach/Bowel: Moderate colonic stool burden without evidence of enteric obstruction. Normal appearance of the terminal ileum and retrocecal appendix. No pneumoperitoneum, pneumatosis or portal venous gas. Lymphatic: No bulky retroperitoneal, mesenteric or pelvic lymphadenopathy. Reproductive: Post hysterectomy however there is an approximately 7.4 x 3.6 x 4.5 cm (axial image 192, series 4, sagittal image 120, series 13) suspected hematoma adjacent to the vaginal cuff. No discrete adnexal lesion. Note is made of thrombosis of multiple bilateral hypertrophied gonadal/adnexal varicosities (representative images 66, 77 and 80, series 5). No definitive discrete adnexal lesion. There is a minimal amount of likely expected fluid within the pelvic cul-de-sac. No definable/drainable fluid collection. Other: Diffuse body wall anasarca. Musculoskeletal: No acute or aggressive osseous abnormalities. Bilateral facet degenerative change within the lower lumbar spine. IMPRESSION: VASCULAR 1. 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 2. 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 3. 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 Of importance, there is occlusive thrombosis of a markedly hypertrophied left gonadal vein to the level of its confluence with the left renal vein which likely previously served as the predominant venous return from the bilateral lower extremities in the setting of IVC atresia. 4. Nonocclusive DVT within the proximal and mid aspects of the right  superficial femoral vein. 5. Patent though atretic right lower extremity inflow and proximal outflow arterial system, congenital versus acquired in the setting of the contralateral proximal left thigh high-flow AVM 6. No evidence of portal venous thrombosis. NON-VASCULAR 1. Suspected 7.4 cm hematoma adjacent to the vaginal cuff. No definable/drainable fluid collections within the abdomen or pelvis. 2. Diffuse body wall anasarca. Critical Value/emergent results were called by telephone at the time of interpretation on 05/12/2018 at 9:06 am to Dr. Evie Lacks, who verbally acknowledged these results. Electronically Signed   By: Simonne Come M.D.   On: 05/12/2018 09:22     Medications:   .  ceFAZolin (ANCEF) IV Stopped (05/13/18 0540)   . docusate sodium  100 mg Oral BID  . feeding supplement (ENSURE ENLIVE)  237 mL Oral TID BM  . heparin injection (subcutaneous)  5,000 Units Subcutaneous Q8H  . mirabegron ER  50 mg Oral Daily  . mirtazapine  15 mg Oral QHS  . nystatin  5 mL Oral QID  . pantoprazole  40 mg Oral Daily  . polyethylene glycol  17 g Oral Daily  . potassium chloride  20 mEq Oral BID  . sodium chloride flush  10-40 mL Intracatheter Q12H   ALPRAZolam, calcium carbonate, HYDROmorphone (DILAUDID) injection, lidocaine, Melatonin, menthol-cetylpyridinium, [DISCONTINUED] ondansetron **OR** ondansetron (ZOFRAN) IV, opium-belladonna, oxyCODONE-acetaminophen, phenazopyridine, phenol, promethazine, sodium chloride flush  Assessment/ Plan:  42 y.o. caucasian female is post op abdominal hysterectomy, left salpingo-oophorectomy, laparoscopic lysis of adhesions and retroperitoneal dissection on 04/28/2018.  Followed by cystoscopy with retrograde pyelogram and left ureteral stent placement on 04/29/2018 Baseline creatinine is 0.79 from 04/19/2018 Post op case complicated by ARF, b/l  hydronephrosis  1. ARF, likely multifactorial from severe ATN and b/l hydronephrosis likely from compression of ureters from  hematoma, post surgical edema. No ureteral injury.  S/p left ureteral stent and right nephrostomy 04/30/18.  Exposed to contrast 05/12/18. -Cr down to 0.95 despite contrast exposure yesterday. CBI stopped and foley catheter has been removed.   2. Generalized edema - continues to have significant body wall edema, hopefully she will begin to mobilize this.  Hesitant to give lasix today since she was exposed to contrast yesterday.   3. Hypokalemia -Potassium up to 3.7 at the moment.    4.  Hyponatremia.  Serum sodium improved to 130 currently, will continue to monitor.   5.  Anemia unspecified.  Pt received blood transfusion earlier this week, hgb has drifted down slightly to 8.3, continue to monitor CBC.     LOS: 16 Cina Klumpp 6/23/201912:14 PM  [facility] [place], [place] [phone]  Note: This note was prepared with Dragon dictation. Any transcription errors are unintentional

## 2018-05-13 NOTE — Evaluation (Signed)
Physical Therapy Evaluation Patient Details Name: [name] MRN: [id number] DOB: 06-24-1976 Today's Date: 05/13/2018   History of Present Illness  Pt is a 42 y/o F s/p laparascopic hysterectomy, Bil aslpingectomy 6/7.  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  Developed hematuria and L hydronephrosis 06/09 requiring cystoscopy and left ureteral stent placement. Renal US 6/10 revealed right hydronephrosis. S/P R percutaneous nephrostomy tube placement 6/10.  NG tube placed 6/10.  Pt with AKI and ileus.  Pt's PMH includes DVT.   Now with extended and complicated hospital stay, followed by OB/GYN, vascular, renal and urology for post-op management.  Cleared by physician (vascular, in particular) for participation with therapy as tolerated without restriction (per Dr. Montel Clock 05/13/18)  Clinical Impression  Patient remains generally anxious and fearful of mobility attempts, requiring increased support/encouragement throughout session.  However, demonstrates ability to complete all functional tasks with less physical assist this date: bed mobility, mod assist; sit/stand, basic transfers and gait (5' x2) with bari RW, mod assist +2.  Remains significantly limited by edema throughout abdomen and bilat LEs (though this is improved with application of ace wraps to LEs; considering surgical intervention by vascular next week to further address per chart). Anticipate performance will continue to improve as patient becomes more confident in self/efforts. Would benefit from skilled PT to address above deficits and promote optimal return to PLOF; recommend transition to acute inpatient rehab upon discharge for high-intensity, post-acute rehab services.      Follow Up Recommendations CIR    Equipment Recommendations  Rolling walker with 5" wheels;3in1 (PT);Wheelchair (measurements PT);Wheelchair cushion  (measurements PT);Hospital bed;Other (comment)    Recommendations for Other Services       Precautions / Restrictions Precautions Precautions: Fall Precaution Comments: ureteral drain, R nephrostomy tube, L IJ CVC line Restrictions Weight Bearing Restrictions: No      Mobility  Bed Mobility Overal bed mobility: Needs Assistance Bed Mobility: Supine to Sit     Supine to sit: Mod assist     General bed mobility comments: assist for LE management towards edged of bed; able to elevate trunk with bilat UEs on bedrails without physical assist from therapist  Transfers Overall transfer level: Needs assistance Equipment used: Rolling walker (2 wheeled) Transfers: Sit to/from Stand Sit to Stand: Mod assist;+2 physical assistance         General transfer comment: broad BOS; constant cuing for technique and mechanics.  Bari RW utilized to accommodate LE position (due to edema)  Ambulation/Gait Ambulation/Gait assistance: Mod assist;+2 physical assistance(+3 for WC follow for patient safety, comfort) Gait Distance (Feet): 5 Feet(x2) Assistive device: Rolling walker (2 wheeled)       General Gait Details: very broad BOS, choppy steps with flat foot contact.  Heavy WBing on RW with forward flexed trunk throughout.  No buckling or LOB, but does fatigue very quickly.  Also very fearful and anxious of standing/gait attempts; constant encouragement throughout.  Stairs            Wheelchair Mobility    Modified Rankin (Stroke Patients Only)       Balance Overall balance assessment: Needs assistance Sitting-balance support: No upper extremity supported;Feet supported Sitting balance-Leahy Scale: Good     Standing balance support: Bilateral upper extremity supported Standing balance-Leahy Scale: Fair  Pertinent Vitals/Pain Pain Assessment: Faces Faces Pain Scale: Hurts little more Pain Location: lower abdomen    Home  Living Family/patient expects to be discharged to:: Private residence Living Arrangements: Spouse/significant other;Children Available Help at Discharge: Family;Available 24 hours/day;Friend(s) Type of Home: House Home Access: Stairs to enter Entrance Stairs-Rails: Right;Left;Can reach both Entrance Stairs-Number of Steps: 4 Home Layout: One level Home Equipment: None      Prior Function Level of Independence: Independent         Comments: Pt was independent with all aspects of mobility and all ADLs, IADLs.  Does not use AD to ambulate. Works full time as a Scientist, physiological at a [employer]   Dominant Hand: Right    Extremity/Trunk Assessment   Upper Extremity Assessment Upper Extremity Assessment: Generalized weakness(grossly at leaset 4-/5 throughout)    Lower Extremity Assessment Lower Extremity Assessment: Generalized weakness(grossly at least 3+ to 4-/5 throughout; globally edematous (R > L), wrapped knees distally in ACE wraps for compression)       Communication   Communication: No difficulties  Cognition Arousal/Alertness: Awake/alert Behavior During Therapy: WFL for tasks assessed/performed;Flat affect Overall Cognitive Status: Within Functional Limits for tasks assessed                                 General Comments: Very fearful of mobility efforts, requiring constant support/encouragement throughout      General Comments      Exercises Other Exercises Other Exercises: Sit/stand x2 from edge of bed, mod assist +2; x2 from recliner, mod assist +2 with bariatric RW.  Much improved from attempts during previous session.  will continue to enforce mechanics and positioning to maximize indep with transfers.  Anticipate performance will continue to improve as patient becomes more confident in self/efforts.   Assessment/Plan    PT Assessment Patient needs continued PT services  PT Problem List Decreased strength;Decreased  range of motion;Decreased activity tolerance;Decreased balance;Decreased mobility;Decreased knowledge of use of DME;Decreased safety awareness;Pain;Cardiopulmonary status limiting activity;Obesity;Decreased skin integrity       PT Treatment Interventions DME instruction;Gait training;Stair training;Functional mobility training;Therapeutic activities;Therapeutic exercise;Balance training;Neuromuscular re-education;Patient/family education;Wheelchair mobility training;Modalities    PT Goals (Current goals can be found in the Care Plan section)  Acute Rehab PT Goals Patient Stated Goal: to try PT Goal Formulation: With patient/family Time For Goal Achievement: 05/27/18 Potential to Achieve Goals: Good    Frequency 7X/week   Barriers to discharge        Co-evaluation               AM-PAC PT "6 Clicks" Daily Activity  Outcome Measure Difficulty turning over in bed (including adjusting bedclothes, sheets and blankets)?: Unable Difficulty moving from lying on back to sitting on the side of the bed? : Unable Difficulty sitting down on and standing up from a chair with arms (e.g., wheelchair, bedside commode, etc,.)?: Unable Help needed moving to and from a bed to chair (including a wheelchair)?: A Lot Help needed walking in hospital room?: A Lot Help needed climbing 3-5 steps with a railing? : A Lot 6 Click Score: 9    End of Session Equipment Utilized During Treatment: Gait belt Activity Tolerance: Patient tolerated treatment well Patient left: in chair;with call bell/phone within reach;with chair alarm set;with family/visitor present Nurse Communication: Mobility status PT Visit Diagnosis: Unsteadiness on feet (R26.81);Muscle weakness (generalized) (M62.81);Difficulty in walking, not elsewhere classified (R26.2)  Time: [id number] PT Time Calculation (min) (ACUTE ONLY): 30 min   Charges:   PT Evaluation $PT Re-evaluation: 1 Re-eval PT Treatments $Therapeutic Activity:  8-22 mins   PT G Codes:        Narya Beavin H. Manson PasseyBrown, PT, DPT, NCS 05/13/18, 12:14 PM [phone]

## 2018-05-13 NOTE — Progress Notes (Signed)
Called Dr. Evie LacksEsco regarding patient complaints of throbbing pain in both ankles radiating up to top of lower leg. Pedal pulses were palpable and extremities warm. Patient's mother took off ace wraps so she could get some relief.  Instructed to rewrap legs and manage pain with medication.  Will continue to monitor.  Arturo MortonClay, Geremy Rister N  05/13/2018 11:54 PM

## 2018-05-13 NOTE — Progress Notes (Signed)
Vascular Surgery  Doing much better. Mild pain/discomfort in legs. Edema stable from yesterday.  Urine Clear. Hgb- 8.3  CTA reviewed personally and discussed with Radiologist.  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  Also: LEFT SFA/SFV AVM- This may be amenable to stenting to alleviate lower extremity venous hypertension/edema.  Will discuss with Dr. Gilda CreaseSchnier for potential intervention early next week.

## 2018-05-14 LAB — BPAM RBC
BLOOD PRODUCT EXPIRATION DATE: 201906252359
Blood Product Expiration Date: 201906302359
Blood Product Expiration Date: 201907162359
Blood Product Expiration Date: 201907192359
ISSUE DATE / TIME: 201906202001
ISSUE DATE / TIME: 201906202359
UNIT TYPE AND RH: 5100
UNIT TYPE AND RH: 5100
UNIT TYPE AND RH: 5100
UNIT TYPE AND RH: 5100

## 2018-05-14 LAB — COMPREHENSIVE METABOLIC PANEL
ALT: 20 U/L (ref 14–54)
AST: 52 U/L — ABNORMAL HIGH (ref 15–41)
Albumin: 2.3 g/dL — ABNORMAL LOW (ref 3.5–5.0)
Alkaline Phosphatase: 215 U/L — ABNORMAL HIGH (ref 38–126)
Anion gap: 10 (ref 5–15)
BILIRUBIN TOTAL: 0.7 mg/dL (ref 0.3–1.2)
BUN: 22 mg/dL — AB (ref 6–20)
CO2: 25 mmol/L (ref 22–32)
CREATININE: 0.95 mg/dL (ref 0.44–1.00)
Calcium: 7.9 mg/dL — ABNORMAL LOW (ref 8.9–10.3)
Chloride: 93 mmol/L — ABNORMAL LOW (ref 101–111)
GFR calc Af Amer: >60 mL/min (ref >60)
Glucose, Bld: 122 mg/dL — ABNORMAL HIGH (ref 65–99)
POTASSIUM: 3.3 mmol/L — AB (ref 3.5–5.1)
Sodium: 128 mmol/L — ABNORMAL LOW (ref 135–145)
TOTAL PROTEIN: 6.2 g/dL — AB (ref 6.5–8.1)

## 2018-05-14 LAB — TYPE AND SCREEN
ABO/RH(D): O POS
Antibody Screen: POSITIVE
DONOR AG TYPE: NEGATIVE
Donor AG Type: NEGATIVE
Unit division: 0
Unit division: 0
Unit division: 0
Unit division: 0

## 2018-05-14 LAB — CBC
HCT: 22.5 % — ABNORMAL LOW (ref 35.0–47.0)
Hemoglobin: 7.7 g/dL — ABNORMAL LOW (ref 12.0–16.0)
MCH: 31.2 pg (ref 26.0–34.0)
MCHC: 34.3 g/dL (ref 32.0–36.0)
MCV: 91 fL (ref 80.0–100.0)
PLATELETS: 305 10*3/uL (ref 150–440)
RBC: 2.47 MIL/uL — ABNORMAL LOW (ref 3.80–5.20)
RDW: 14.8 % — AB (ref 11.5–14.5)
WBC: 12.6 10*3/uL — AB (ref 3.6–11.0)

## 2018-05-14 LAB — CULTURE, BLOOD (ROUTINE X 2)
Culture: NO GROWTH
SPECIAL REQUESTS: ADEQUATE

## 2018-05-14 NOTE — Evaluation (Addendum)
Occupational Therapy Re-Evaluation Patient Details Name: [name] MRN: [id number] DOB: 08/29/1976 Today's Date: 05/14/2018    History of Present Illness Pt is a 42 y/o F s/p laparascopic hysterectomy, Bil aslpingectomy 6/7.  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  Developed hematuria and L hydronephrosis 06/09 requiring cystoscopy and left ureteral stent placement. Renal US 6/10 revealed right hydronephrosis. S/P R percutaneous nephrostomy tube placement 6/10.  NG tube placed 6/10.  Pt with AKI and ileus.  Pt's PMH includes DVT.   Now with extended and complicated hospital stay, followed by OB/GYN, vascular, renal and urology for post-op management.  Cleared by physician (vascular, in particular) for participation with therapy as tolerated without restriction (per Dr. Montel Clock 05/13/18)   Clinical Impression   Pt seen for OT re-evaluation this date. 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 Pt endorses 24/7 assist as needed once she is able to return home. Currently pt demonstrates impairments in strength, activity tolerance, balance, and knowledge of DME/AE requiring mod assist for bed mobility, mod assist +2 for transfers and functional mobility (10' x2 with seated rest break in between), min assist for UB bathing and dressing and mod assist for LB bathing and dressing. Requires encouragement for mobility, as she expresses some fearfulness. Pt has demonstrates improvement and progress since time of initial evaluation, prompting re-evaluation this date and updated goals. Pt would benefit from continued skilled OT to address noted impairments and functional limitations (see below for any additional details) in order to maximize safety and independence  while minimizing falls risk and caregiver burden. Anticipate performance will continue to improve as patient becomes more confident in self/efforts. Upon hospital discharge, recommend transition to acute inpatient rehab upon discharge for high-intensity, post-acute rehab services.   Patient suffers from significant complications from hysterectomy which impairs her ability to perform daily activities like toileting, bathing, and dressing as well as functional mobility in the home. A walker will not resolve the patient's issue with performing activities of daily living. A wheelchair is required/recommended and will allow patient to safely perform daily activities. Patient can safely propel the wheelchair in the home or has a caregiver who can provide assistance.      Follow Up Recommendations  CIR    Equipment Recommendations  Other (comment);Hospital bed;3 in 1 bedside commode;Wheelchair cushion (measurements OT);Wheelchair (measurements OT)(bari BSC, leg lifter, reacher, sock aide)    Recommendations for Other Services Rehab consult     Precautions / Restrictions Precautions Precautions: Fall Precaution Comments: R nephrostomy tube, L IJ CVC line Restrictions Weight Bearing Restrictions: No      Mobility Bed Mobility Overal bed mobility: Needs Assistance Bed Mobility: Supine to Sit     Supine to sit: Mod assist     General bed mobility comments: assist for LE management towards edged of bed provided by PT (please see PT note for additional detail); able to elevate trunk with bilat UEs, propping on L elbow and using bedrails to assist, without physical assist from therapist  Transfers Overall transfer level: Needs assistance Equipment used: Rolling walker (2 wheeled) Transfers: Sit to/from Stand Sit to Stand: Mod assist;+2 physical assistance         General transfer comment: cues for technique and mechanics    Balance Overall balance assessment: Needs  assistance Sitting-balance support: No upper extremity supported;Feet supported Sitting balance-Leahy Scale: Good     Standing balance support: Bilateral upper extremity supported Standing balance-Leahy Scale: Fair                             ADL either performed or assessed with clinical judgement   ADL Overall ADL's : Needs assistance/impaired Eating/Feeding: Sitting;Modified independent   Grooming: Sitting;Modified independent   Upper Body Bathing: Sitting;Minimal assistance   Lower Body Bathing: Sit to/from stand;Moderate assistance   Upper Body Dressing : Sitting;Minimal assistance   Lower Body Dressing: Sit to/from stand;Moderate assistance   Toilet Transfer: +2 for physical assistance;Moderate assistance;Ambulation;BSC;RW;Requires wide/bariatric Statistician Details (indicate cue type and reason): cues for foot/hand placement Toileting- Clothing Manipulation and Hygiene: Maximal assistance       Functional mobility during ADLs: Moderate assistance;Rolling walker;+2 for physical assistance       Vision Patient Visual Report: No change from baseline       Perception     Praxis      Pertinent Vitals/Pain Pain Assessment: 0-10 Pain Score: 7  Pain Location: lower abdomen near incision Pain Descriptors / Indicators: Constant;Grimacing Pain Intervention(s): Limited activity within patient's tolerance;Monitored during session;Patient requesting pain meds-RN notified     Hand Dominance Right   Extremity/Trunk Assessment Upper Extremity Assessment Upper Extremity Assessment: Generalized weakness(grossly at leaset 4-/5 throughout)   Lower Extremity Assessment Lower Extremity Assessment: Generalized weakness(grossly at least 3+ to 4-/5 throughout; globally edematous (R > L), wrapped knees distally in ACE wraps for compression)   Cervical / Trunk Assessment Cervical / Trunk Assessment: Normal   Communication Communication Communication: No  difficulties   Cognition Arousal/Alertness: Awake/alert Behavior During Therapy: WFL for tasks assessed/performed Overall Cognitive Status: Within Functional Limits for tasks assessed                                 General Comments: continues to be fearful during mobility efforts requiring support/encouragement t/o   General Comments       Exercises     Shoulder Instructions      Home Living Family/patient expects to be discharged to:: Private residence Living Arrangements: Spouse/significant other;Children Available Help at Discharge: Family;Available 24 hours/day;Friend(s) Type of Home: House Home Access: Stairs to enter [employer] of Steps: 4 Entrance Stairs-Rails: Right;Left;Can reach both Home Layout: One level     Bathroom Shower/Tub: IT trainer: Standard     Home Equipment: None          Prior Functioning/Environment Level of Independence: Independent        Comments: Pt was independent with all aspects of mobility and all ADLs, IADLs.  Does not use AD to ambulate. Works full time as a Scientist, physiological at a [employer] Problem List: Decreased strength;Decreased activity tolerance;Decreased knowledge of use of DME or AE;Impaired balance (sitting and/or standing);Pain      OT Treatment/Interventions: Self-care/ADL training;Therapeutic exercise;Balance training;Therapeutic activities;DME and/or AE instruction;Patient/family education;Energy conservation    OT Goals(Current goals can be found in the care plan section) Acute Rehab OT Goals Patient Stated Goal: "to try my best" OT Goal Formulation: With patient Time For Goal Achievement: 05/28/18 Potential to Achieve Goals: Good  OT Frequency: Min 3X/week   Barriers to D/C:            Co-evaluation PT/OT/SLP  Co-Evaluation/Treatment: Yes Reason for Co-Treatment: For patient/therapist safety;To address functional/ADL transfers   OT  goals addressed during session: Proper use of Adaptive equipment and DME;ADL's and self-care;Strengthening/ROM      AM-PAC PT "6 Clicks" Daily Activity     Outcome Measure Help from another person eating meals?: None Help from another person taking care of personal grooming?: None Help from another person toileting, which includes using toliet, bedpan, or urinal?: A Lot Help from another person bathing (including washing, rinsing, drying)?: A Lot Help from another person to put on and taking off regular upper body clothing?: A Little Help from another person to put on and taking off regular lower body clothing?: A Lot 6 Click Score: 17   End of Session Equipment Utilized During Treatment: Gait belt;Rolling walker Nurse Communication: Patient requests pain meds  Activity Tolerance: Patient tolerated treatment well Patient left: in chair;with call bell/phone within reach;with chair alarm set  OT Visit Diagnosis: Other abnormalities of gait and mobility (R26.89);Muscle weakness (generalized) (M62.81)                Time: [id number] OT Time Calculation (min): 32 min Charges:  OT General Charges $OT Visit: 1 Visit OT Evaluation $OT Re-eval: 1 Re-eval  Richrd PrimeJamie Stiller, MPH, MS, OTR/L ascom [phone] 05/14/18, 3:42 PM

## 2018-05-14 NOTE — Progress Notes (Signed)
   05/14/18 1100  Clinical Encounter Type  Visited With Patient and family together  Visit Type Follow-up  Spiritual Encounters  Spiritual Needs Emotional   Chaplain checked in with patient; family at bedside.  Patient reported that she felt things were 'headed in the right direction' but slowly.  Patient misses being home but said that she's trying to 'stay positive'.  Patient stated that she needs to get to the bathroom and called staff.  She remains open to ongoing chaplain follow up and prayers for healing and getting back home.

## 2018-05-14 NOTE — Progress Notes (Signed)
[facility]  ROUNDING NOTE   Subjective:   Husband at bedside.   Na 128  Eating better.   Objective:  Vital signs in last 24 hours:  Temp:  [97.9 F (36.6 C)-98.9 F (37.2 C)] 97.9 F (36.6 C) (06/24 1210) Pulse Rate:  [105-120] 105 (06/24 1210) Resp:  [18-22] 18 (06/24 1210) BP: (117-135)/(55-71) 118/55 (06/24 1210) SpO2:  [99 %-100 %] 100 % (06/24 1210)  Weight change:  Filed Weights   05/03/18 0800  Weight: 93 kg (205 lb)    Intake/Output: I/O last 3 completed shifts: In: 480 [P.O.:480] Out: 2400 [Urine:2400]   Intake/Output this shift:  Total I/O In: -  Out: 775 [Urine:775]  Physical Exam: General: NAD,   Head: Normocephalic, atraumatic. Moist oral mucosal membranes  Eyes: Anicteric, PERRL  Neck: Supple, trachea midline  Lungs:  Clear to auscultation  Heart: Regular rate and rhythm  Abdomen:  Soft, nontender,   Extremities: no peripheral edema.  Neurologic: Nonfocal, moving all four extremities  Skin: No lesions        Basic Metabolic Panel: Recent Labs  Lab 05/10/18 0452 05/11/18 1548 05/12/18 0517 05/13/18 0513 05/14/18 0554  NA 128* 127* 128* 130* 128*  K 4.2 3.8 3.3* 3.7 3.3*  CL 91* 91* 93* 95* 93*  CO2 24 24 24 25 25   GLUCOSE 119* 135* 142* 146* 122*  BUN 44* 38* 31* 21* 22*  CREATININE 1.14* 1.19* 1.09* 0.95 0.95  CALCIUM 7.9* 8.0* 8.0* 8.0* 7.9*    Liver Function Tests: Recent Labs  Lab 05/11/18 1548 05/12/18 0517 05/13/18 0513 05/14/18 0554  AST 86* 63* 57* 52*  ALT 56* 42 25 20  ALKPHOS 233* 215* 236* 215*  BILITOT 0.9 0.9 0.7 0.7  PROT 6.3* 6.2* 6.0* 6.2*  ALBUMIN 3.0* 2.9* 2.5* 2.3*   No results for input(s): LIPASE, AMYLASE in the last 168 hours. No results for input(s): AMMONIA in the last 168 hours.  CBC: Recent Labs  Lab 05/09/18 1300 05/10/18 0452 05/11/18 0406 05/12/18 0517 05/13/18 0513 05/14/18 0554  WBC 22.9* 20.4*  --  18.1* 14.9* 12.6*  HGB 7.2* 7.0* 9.2* 9.0* 8.3* 7.7*  HCT  21.6* 20.5* 25.6* 26.1* 24.5* 22.5*  MCV 90.3 89.8  --  89.6 91.4 91.0  PLT 398 370  --  319 294 305    Cardiac Enzymes: No results for input(s): CKTOTAL, CKMB, CKMBINDEX, TROPONINI in the last 168 hours.  BNP: Invalid input(s): POCBNP  CBG: Recent Labs  Lab 05/13/18 1110  GLUCAP 145*    Microbiology: Results for orders placed or performed during the hospital encounter of 04/27/18  MRSA PCR Screening     Status: None   Collection Time: 04/27/18 10:27 PM  Result Value Ref Range Status   MRSA by PCR NEGATIVE NEGATIVE Final    Comment:        The GeneXpert MRSA Assay (FDA approved for NASAL specimens only), is one component of a comprehensive MRSA colonization surveillance program. It is not intended to diagnose MRSA infection nor to guide or monitor treatment for MRSA infections. Performed at [facility], [address] [place], [place] [address]   CULTURE, BLOOD (ROUTINE X 2) w Reflex to ID Panel     Status: None   Collection Time: 04/28/18  9:24 PM  Result Value Ref Range Status   Specimen Description BLOOD RIGHT HAND  Final   Special Requests   Final    BOTTLES DRAWN AEROBIC AND ANAEROBIC Blood Culture adequate volume  Culture   Final    NO GROWTH 5 DAYS Performed at [facility], [address] [place], [place] [address]    Report Status 05/03/2018 FINAL  Final  CULTURE, BLOOD (ROUTINE X 2) w Reflex to ID Panel     Status: None   Collection Time: 04/28/18  9:44 PM  Result Value Ref Range Status   Specimen Description BLOOD RIGHT ANTECUBITAL  Final   Special Requests   Final    BOTTLES DRAWN AEROBIC AND ANAEROBIC Blood Culture adequate volume   Culture   Final    NO GROWTH 5 DAYS Performed at [facility], [address] [place], [place] [address]    Report Status 05/03/2018 FINAL  Final  Urine Culture     Status: None   Collection Time: 05/09/18  4:30 PM  Result Value Ref Range Status   Specimen Description   Final     URINE, CATHETERIZED Performed at [facility], [address] [place], [place] [address]    Special Requests   Final    Normal Performed at [facility], [address] [place], [place] [address]    Culture   Final    NO GROWTH Performed at [facility], [address] [place], [place] [address]    Report Status 05/10/2018 FINAL  Final  CULTURE, BLOOD (ROUTINE X 2) w Reflex to ID Panel     Status: Abnormal   Collection Time: 05/09/18  6:02 PM  Result Value Ref Range Status   Specimen Description   Final    BLOOD LEFT HAND Performed at [facility], [address] [place], [place] [address]    Special Requests   Final    BOTTLES DRAWN AEROBIC AND ANAEROBIC Blood Culture adequate volume Performed at [facility], [address] [place], [place] [address]    Culture  Setup Time   Final    AEROBIC BOTTLE ONLY GRAM POSITIVE COCCI CRITICAL RESULT CALLED TO, READ BACK BY AND VERIFIED WITH: LISA KLUTTZ 05/10/18 1433 KLW    Culture (A)  Final    STAPHYLOCOCCUS SPECIES (COAGULASE NEGATIVE) THE SIGNIFICANCE OF ISOLATING THIS ORGANISM FROM A SINGLE SET OF BLOOD CULTURES WHEN MULTIPLE SETS ARE DRAWN IS UNCERTAIN. PLEASE NOTIFY THE MICROBIOLOGY DEPARTMENT WITHIN ONE WEEK IF SPECIATION AND SENSITIVITIES ARE REQUIRED. Performed at [facility], [address] [place], [place] [address]    Report Status 05/12/2018 FINAL  Final  Blood Culture ID Panel (Reflexed)     Status: Abnormal   Collection Time: 05/09/18  6:02 PM  Result Value Ref Range Status   Enterococcus species NOT DETECTED NOT DETECTED Final   Listeria monocytogenes NOT DETECTED NOT DETECTED Final   Staphylococcus species DETECTED (A) NOT DETECTED Final    Comment: Methicillin (oxacillin) susceptible coagulase negative staphylococcus. Possible blood culture contaminant (unless isolated from more than one blood culture draw or clinical case suggests pathogenicity). No  antibiotic treatment is indicated for blood  culture contaminants. CRITICAL RESULT CALLED TO, READ BACK BY AND VERIFIED WITH: LISA KLUTTZ 05/10/18 1433 KLW    Staphylococcus aureus NOT DETECTED NOT DETECTED Final   Methicillin resistance NOT DETECTED NOT DETECTED Final   Streptococcus species NOT DETECTED NOT DETECTED Final   Streptococcus agalactiae NOT DETECTED NOT DETECTED Final   Streptococcus pneumoniae NOT DETECTED NOT DETECTED Final   Streptococcus pyogenes NOT DETECTED NOT DETECTED Final   Acinetobacter baumannii NOT DETECTED NOT DETECTED Final   Enterobacteriaceae species NOT DETECTED  NOT DETECTED Final   Enterobacter cloacae complex NOT DETECTED NOT DETECTED Final   Escherichia coli NOT DETECTED NOT DETECTED Final   Klebsiella oxytoca NOT DETECTED NOT DETECTED Final   Klebsiella pneumoniae NOT DETECTED NOT DETECTED Final   Proteus species NOT DETECTED NOT DETECTED Final   Serratia marcescens NOT DETECTED NOT DETECTED Final   Haemophilus influenzae NOT DETECTED NOT DETECTED Final   Neisseria meningitidis NOT DETECTED NOT DETECTED Final   Pseudomonas aeruginosa NOT DETECTED NOT DETECTED Final   Candida albicans NOT DETECTED NOT DETECTED Final   Candida glabrata NOT DETECTED NOT DETECTED Final   Candida krusei NOT DETECTED NOT DETECTED Final   Candida parapsilosis NOT DETECTED NOT DETECTED Final   Candida tropicalis NOT DETECTED NOT DETECTED Final    Comment: Performed at [facility], [address] [place], [place] [address]  CULTURE, BLOOD (ROUTINE X 2) w Reflex to ID Panel     Status: None   Collection Time: 05/09/18  7:00 PM  Result Value Ref Range Status   Specimen Description BLOOD RIGHT HAND  Final   Special Requests   Final    BOTTLES DRAWN AEROBIC AND ANAEROBIC Blood Culture adequate volume   Culture   Final    NO GROWTH 5 DAYS Performed at [facility], [address] [place], [place] [address]    Report Status 05/14/2018 FINAL  Final   CULTURE, BLOOD (ROUTINE X 2) w Reflex to ID Panel     Status: None (Preliminary result)   Collection Time: 05/10/18  7:10 PM  Result Value Ref Range Status   Specimen Description BLOOD LINE DRAW PER MD  Final   Special Requests   Final    BOTTLES DRAWN AEROBIC AND ANAEROBIC Blood Culture results may not be optimal due to an excessive volume of blood received in culture bottles   Culture   Final    NO GROWTH 4 DAYS Performed at [facility], [address] [place], [place] [address]    Report Status PENDING  Incomplete    Coagulation Studies: No results for input(s): LABPROT, INR in the last 72 hours.  Urinalysis: No results for input(s): COLORURINE, LABSPEC, PHURINE, GLUCOSEU, HGBUR, BILIRUBINUR, KETONESUR, PROTEINUR, UROBILINOGEN, NITRITE, LEUKOCYTESUR in the last 72 hours.  Invalid input(s): APPERANCEUR    Imaging: No results found.   Medications:   .  ceFAZolin (ANCEF) IV Stopped (05/14/18 9147)   . docusate sodium  100 mg Oral BID  . feeding supplement (ENSURE ENLIVE)  237 mL Oral TID BM  . heparin injection (subcutaneous)  5,000 Units Subcutaneous Q8H  . mirabegron ER  50 mg Oral Daily  . mirtazapine  15 mg Oral QHS  . multivitamin with minerals  1 tablet Oral Daily  . nystatin  5 mL Oral QID  . pantoprazole  40 mg Oral Daily  . polyethylene glycol  17 g Oral Daily  . sodium chloride flush  10-40 mL Intracatheter Q12H   ALPRAZolam, calcium carbonate, HYDROmorphone (DILAUDID) injection, lidocaine, Melatonin, menthol-cetylpyridinium, [DISCONTINUED] ondansetron **OR** ondansetron (ZOFRAN) IV, opium-belladonna, oxyCODONE-acetaminophen, phenazopyridine, phenol, promethazine, sodium chloride flush  Assessment/ Plan:  [name] is a 42 y.o. whtie female  post op abdominal hysterectomy, left salpingo-oophorectomy, laparoscopic lysis of adhesions and retroperitoneal dissection on 04/28/2018. Followed by cystoscopy with retrograde pyelogram and left  ureteral stent placement on 04/29/2018 Baseline creatinine is 0.79 from 04/19/2018 Post op case complicated by ARF, b/l  hydronephrosis  1. ARF, likely multifactorial from severe ATN and b/l  hydronephrosis likely from compression of ureters from hematoma, post surgical edema. No ureteral injury.  S/p left ureteral stent and right nephrostomy 04/30/18.  Exposed to contrast 05/12/18. -Cr down to 0.95 despite contrast exposure yesterday. CBI stopped and foley catheter has been removed.   2. Generalized edema - continues to have significant body wall edema, hopefully she will begin to mobilize this.  Hesitant to give lasix today since she was exposed to contrast yesterday.   3. Hypokalemia -Potassium up to 3.7 at the moment.    4.  Hyponatremia.  Serum sodium improved to 138 - encourage PO intake.   5.  Anemia from blood loss     LOS: 17 Kirsten Mckone 6/24/20192:52 PM

## 2018-05-14 NOTE — Progress Notes (Signed)
Physical Therapy Treatment Patient Details Name: [name] MRN: [id number] DOB: 1976-06-02 Today's Date: 05/14/2018    History of Present Illness Pt is a 42 y/o F s/p laparascopic hysterectomy, Bil aslpingectomy 6/7.  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  Developed hematuria and L hydronephrosis 06/09 requiring cystoscopy and left ureteral stent placement. Renal US 6/10 revealed right hydronephrosis. S/P R percutaneous nephrostomy tube placement 6/10.  NG tube placed 6/10.  Pt with AKI and ileus.  Pt's PMH includes DVT.   Now with extended and complicated hospital stay, followed by OB/GYN, vascular, renal and urology for post-op management.  Cleared by physician (vascular, in particular) for participation with therapy as tolerated without restriction (per Dr. Montel Clock 05/13/18)    PT Comments    Patient with gradual progression in gait distance this date, performing all mobility with less overall physical assist, greater confidence than noted during previous sessions.  Continues to be limited, in part, by anxiety with mobility attempts; may benefit from coordination of oral anti-anxiety meds (has order for xanax PRN per RN) prior to therapy session, especially as progressing in distance, session duration and intensity.     Follow Up Recommendations  CIR     Equipment Recommendations  Rolling walker with 5" wheels;3in1 (PT);Wheelchair (measurements PT);Wheelchair cushion (measurements PT);Hospital bed;Other (comment)    Recommendations for Other Services       Precautions / Restrictions Precautions Precautions: Fall Precaution Comments: R nephrostomy tube, L IJ CVC line Restrictions Weight Bearing Restrictions: No    Mobility  Bed Mobility Overal bed mobility: Needs Assistance Bed Mobility: Supine to Sit     Supine to sit: Mod assist     General bed mobility comments: assist  for LEs (R > L) for movement towards edge of bed; able to elevate trunk with increased time, heavy use of bedrail (from flat bed surface) without assist  Transfers Overall transfer level: Needs assistance Equipment used: Rolling walker (2 wheeled) Transfers: Sit to/from Stand Sit to Stand: Mod assist         General transfer comment: cuing for scooting edge of seating surface, bilat hand and foot placement; much more indep efforts to initiate sit/stand; completing with overall assist, increased confidence this date  Ambulation/Gait Ambulation/Gait assistance: Mod assist;+2 physical assistance Gait Distance (Feet): (10' x1, 12' x1) Assistive device: Rolling walker (2 wheeled)       General Gait Details: 3-point gait pattern with very broad BOS, forward trunk flexion (improved temporarily with cuing, fatigues quickly).  Intermittent standing rest breaks due to fatigue, increased anxiety with gait attempts.  Good effort this date with gradual progression in distance  HR elevation to 150s with effort (reflective of workload); appropriately returns to baseline with seated rest period.   Stairs             Wheelchair Mobility    Modified Rankin (Stroke Patients Only)       Balance Overall balance assessment: Needs assistance Sitting-balance support: No upper extremity supported;Feet supported Sitting balance-Leahy Scale: Good     Standing balance support: Bilateral upper extremity supported Standing balance-Leahy Scale: Fair                              Cognition Arousal/Alertness: Awake/alert Behavior During Therapy: WFL for tasks assessed/performed;Anxious Overall Cognitive Status: Within Functional Limits for tasks assessed  Exercises Other Exercises Other Exercises: Verbally reviewed HEP and encouraged performance outside of therapy; appropriate therex 'starred' in handouts already in room.   Patient voiced understanding. Other Exercises: Placed bariatric BSC in room for patient use; instructed in/encouraged OOB to [facility] every 2 hours for bladder retraining and mobility efforts outside of therapy.  Patient voiced agreement, understanding.    General Comments        Pertinent Vitals/Pain Pain Assessment: Faces Faces Pain Scale: Hurts a little bit Pain Location: R LE, lower abdomen Pain Descriptors / Indicators: Constant;Grimacing Pain Intervention(s): Limited activity within patient's tolerance;Monitored during session;Repositioned;Patient requesting pain meds-RN notified    Home Living                      Prior Function            PT Goals (current goals can now be found in the care plan section) Acute Rehab PT Goals Patient Stated Goal: "to try my best" PT Goal Formulation: With patient/family Time For Goal Achievement: 05/27/18 Potential to Achieve Goals: Good Progress towards PT goals: Progressing toward goals    Frequency    7X/week      PT Plan Current plan remains appropriate    Co-evaluation              AM-PAC PT "6 Clicks" Daily Activity  Outcome Measure  Difficulty turning over in bed (including adjusting bedclothes, sheets and blankets)?: Unable Difficulty moving from lying on back to sitting on the side of the bed? : Unable Difficulty sitting down on and standing up from a chair with arms (e.g., wheelchair, bedside commode, etc,.)?: Unable Help needed moving to and from a bed to chair (including a wheelchair)?: A Lot Help needed walking in hospital room?: A Lot Help needed climbing 3-5 steps with a railing? : Total 6 Click Score: 8    End of Session Equipment Utilized During Treatment: Gait belt Activity Tolerance: Patient tolerated treatment well Patient left: in chair;with call bell/phone within reach;with chair alarm set Nurse Communication: Mobility status PT Visit Diagnosis: Unsteadiness on feet (R26.81);Muscle  weakness (generalized) (M62.81);Difficulty in walking, not elsewhere classified (R26.2)     Time: [id number] PT Time Calculation (min) (ACUTE ONLY): 32 min  Charges:  $Gait Training: 8-22 mins $Therapeutic Activity: 8-22 mins                    G Codes:       Suzzanne Brunkhorst H. Manson PasseyBrown, PT, DPT, NCS 05/14/18, 10:53 PM [phone]

## 2018-05-14 NOTE — Progress Notes (Signed)
Patient ID: [name], female   DOB: 07/14/1976, 42 y.o.   MRN: [id number] 16 Days Post-Op  Procedure(s):  1. 04/28/18: ATTEMPTED LAPAROSCOPIC HYSTERECTOMY CONVERTED TO SUPRACERVICAL ABDOMINAL HYSTERECTOMY (N/A) SALPINGO OOPHORECTOMY (Left) CYSTOSCOPY (N/A) UNILATERAL SALPINGECTOMY (Right) LAPAROSCOPIC LYSIS OF ADHESIONS, RETROPERITONEAL DISSECTION  2. 04/29/18: CYSTOSCOPY WITH RETROGRADE PYELOGRAM/Left URETERAL STENT PLACEMENT, ureteroscopy (Bilateral)    Subjective: Feeling better more cheerful. 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  - Nutrition consult by vascular to try to raise albumin, three Ensure's a day, trying for 100-120g protein daily. - Hgb still dropping since blood transfusion, now 7.7 from 9.2 two days ago. CT scan for vasculature found a 7cm hematoma that can't be drained in pelvis, but no active bleeding or free fluid. - On Ancef for positive blood culture, likely contaminant, now no growth from central line x4 days. - OOB today, tolerating regular diet - s/p Foley cath, voiding spontaneously - nephrostomy tube clamped today, no leaking as of now    Objective: Vital signs in last 24 hours: Temp:  [97.9 F (36.6 C)-98.9 F (37.2 C)] 97.9 F (36.6 C) (06/24 1210) Pulse Rate:  [105-120] 105 (06/24 1210) Resp:  [16-22] 18 (06/24 1210) BP: (117-135)/(55-71) 118/55 (06/24 1210) SpO2:  [97 %-100 %] 100 % (06/24 1210)  Intake/Output  Intake/Output Summary (Last 24 hours) at 05/14/2018 1328 Last data filed at 05/14/2018 1231 Gross per 24 hour  Intake -  Output 1905 ml  Net -1905 ml    Physical Exam:  General: Oriented and responsive, appears more cheerful CV: Regular rhythm, no murmurs, regular rate Lungs: CTAB GI: Non-tender, non-distended. Normal bowel sounds. Ecchymosis over mons improving, and lap incisions clean and dry. Pitting edema to umbilicus.  Incisions: Clean and dry Urine: Pink, Foley in  place Extremities: Nontender, no erythema, LE wrapped. Fingers, hands and arms are no longer edematous.  Lab Results: Recent Labs    05/12/18 0517 05/13/18 0513 05/14/18 0554  HGB 9.0* 8.3* 7.7*  HCT 26.1* 24.5* 22.5*  WBC 18.1* 14.9* 12.6*  PLT 319 294 305                 Results for orders placed or performed during the hospital encounter of 04/27/18 (from the past 24 hour(s))  CBC     Status: Abnormal   Collection Time: 05/14/18  5:54 AM  Result Value Ref Range   WBC 12.6 (H) 3.6 - 11.0 K/uL   RBC 2.47 (L) 3.80 - 5.20 MIL/uL   Hemoglobin 7.7 (L) 12.0 - 16.0 g/dL   HCT 72.5 (L) 36.6 - 44.0 %   MCV 91.0 80.0 - 100.0 fL   MCH 31.2 26.0 - 34.0 pg   MCHC 34.3 32.0 - 36.0 g/dL   RDW 34.7 (H) 42.5 - 95.6 %   Platelets 305 150 - 440 K/uL  Comprehensive metabolic panel     Status: Abnormal   Collection Time: 05/14/18  5:54 AM  Result Value Ref Range   Sodium 128 (L) 135 - 145 mmol/L   Potassium 3.3 (L) 3.5 - 5.1 mmol/L   Chloride 93 (L) 101 - 111 mmol/L   CO2 25 22 - 32 mmol/L   Glucose, Bld 122 (H) 65 - 99 mg/dL   BUN 22 (H) 6 - 20 mg/dL   Creatinine, Ser 3.87 0.44 - 1.00 mg/dL   Calcium 7.9 (L) 8.9 - 10.3 mg/dL   Total Protein 6.2 (L) 6.5 - 8.1 g/dL   Albumin 2.3 (L)  3.5 - 5.0 g/dL   AST 52 (H) 15 - 41 U/L   ALT 20 14 - 54 U/L   Alkaline Phosphatase 215 (H) 38 - 126 U/L   Total Bilirubin 0.7 0.3 - 1.2 mg/dL   GFR calc non Af Amer >60 >60 mL/min   GFR calc Af Amer >60 >60 mL/min   Anion gap 10 5 - 15    Assessment/Plan: 16 Days Post-Op        Procedure(s): 1. 04/28/18: ATTEMPTED LAPAROSCOPIC HYSTERECTOMY CONVERTED TO SUPRACERVICAL ABDOMINAL HYSTERECTOMY (N/A) SALPINGO OOPHORECTOMY (Left) CYSTOSCOPY (N/A) UNILATERAL SALPINGECTOMY (Right) LAPAROSCOPIC LYSIS OF ADHESIONS, RETROPERITONEAL DISSECTION   2. 04/29/18: CYSTOSCOPY WITH RETROGRADE PYELOGRAM/Left URETERAL STENT PLACEMENT, ureteroscopy (Bilateral)  IMP: Generalized edema Anemia ATN -  resolved  PLAN/REC:  - Vascular surgery now on board for management of her edema and vena cava occlusion with the resultant sequelae. I have ordered abdominal binder to see if that helps. Encourage both ambulation and Trendelenburg as tolerated. Their long-term plan at this point will dictate when she can go home. - Nutrition recs  - Anemia- monitoring h/h, s/p transfusion. Hgb continuing to drop. Will discuss holding anticoagulation with vascular.    - Will d/c Ancef - Keep central line for vascular  Appreciate nephro recs S/p psychiatry consult S/p Consult to pastoral care Continue physical therapy. PT has determine to be discharged home with home health, and case management working on that with her and family. Regular diet   Amy DouglasBethany Amy [place], MD Cell phone: [phone]   LOS: 17 days   [name] 05/14/2018, 1:28 PMPatient ID: Amy SidleGinger B [name], female   DOB: 02/01/1976, 42 y.o.   MRN: [id number]

## 2018-05-14 NOTE — Progress Notes (Signed)
Patient discussed with nurse today.  Plan for clamping of right nephrostomy tube.  Notably, she had an antegrade nephrostogram on Thursday which showed prompt drainage of her right ureter into the bladder.    We will recheck on patient tomorrow and if she is doing well, will remove nephrostomy tube.  She is voiding spontaneously without difficulty.  Vanna ScotlandAshley Yer Castello, MD

## 2018-05-15 LAB — CBC
HCT: 21.9 % — ABNORMAL LOW (ref 35.0–47.0)
HEMOGLOBIN: 7.6 g/dL — AB (ref 12.0–16.0)
MCH: 31.6 pg (ref 26.0–34.0)
MCHC: 34.8 g/dL (ref 32.0–36.0)
MCV: 91 fL (ref 80.0–100.0)
Platelets: 303 10*3/uL (ref 150–440)
RBC: 2.41 MIL/uL — ABNORMAL LOW (ref 3.80–5.20)
RDW: 14.6 % — ABNORMAL HIGH (ref 11.5–14.5)
WBC: 11.1 10*3/uL — AB (ref 3.6–11.0)

## 2018-05-15 LAB — COMPREHENSIVE METABOLIC PANEL
ALK PHOS: 254 U/L — AB (ref 38–126)
ALT: 14 U/L (ref 0–44)
AST: 53 U/L — ABNORMAL HIGH (ref 15–41)
Albumin: 2.6 g/dL — ABNORMAL LOW (ref 3.5–5.0)
Anion gap: 10 (ref 5–15)
BUN: 24 mg/dL — ABNORMAL HIGH (ref 6–20)
CALCIUM: 7.8 mg/dL — AB (ref 8.9–10.3)
CO2: 25 mmol/L (ref 22–32)
CREATININE: 1.37 mg/dL — AB (ref 0.44–1.00)
Chloride: 93 mmol/L — ABNORMAL LOW (ref 98–111)
GFR, EST AFRICAN AMERICAN: 54 mL/min — AB (ref >60)
GFR, EST NON AFRICAN AMERICAN: 47 mL/min — AB (ref >60)
Glucose, Bld: 113 mg/dL — ABNORMAL HIGH (ref 70–99)
Potassium: 3.4 mmol/L — ABNORMAL LOW (ref 3.5–5.1)
Sodium: 128 mmol/L — ABNORMAL LOW (ref 135–145)
Total Bilirubin: 0.7 mg/dL (ref 0.3–1.2)
Total Protein: 6.5 g/dL (ref 6.5–8.1)

## 2018-05-15 LAB — CULTURE, BLOOD (ROUTINE X 2): CULTURE: NO GROWTH

## 2018-05-15 LAB — PREPARE RBC (CROSSMATCH)

## 2018-05-15 LAB — MAGNESIUM: Magnesium: 1.6 mg/dL — ABNORMAL LOW (ref 1.7–2.4)

## 2018-05-15 LAB — PHOSPHORUS: PHOSPHORUS: 3 mg/dL (ref 2.5–4.6)

## 2018-05-15 MED ORDER — ACETAMINOPHEN 325 MG PO TABS
650.0000 mg | ORAL_TABLET | Freq: Once | ORAL | Status: AC
  Administered 2018-05-15: 650 mg via ORAL
  Filled 2018-05-15: qty 2

## 2018-05-15 MED ORDER — FUROSEMIDE 10 MG/ML IJ SOLN
20.0000 mg | Freq: Once | INTRAMUSCULAR | Status: AC
  Administered 2018-05-15: 20 mg via INTRAVENOUS
  Filled 2018-05-15: qty 4

## 2018-05-15 MED ORDER — HYDROMORPHONE HCL 1 MG/ML IJ SOLN
0.5000 mg | Freq: Four times a day (QID) | INTRAMUSCULAR | Status: DC | PRN
  Administered 2018-05-15 – 2018-05-16 (×2): 0.5 mg via INTRAVENOUS
  Filled 2018-05-15 (×2): qty 0.5

## 2018-05-15 MED ORDER — DIPHENHYDRAMINE HCL 25 MG PO CAPS
25.0000 mg | ORAL_CAPSULE | Freq: Once | ORAL | Status: AC
  Administered 2018-05-15: 25 mg via ORAL
  Filled 2018-05-15: qty 1

## 2018-05-15 MED ORDER — SODIUM CHLORIDE 0.9% IV SOLUTION: Freq: Once | INTRAVENOUS | Status: DC

## 2018-05-15 NOTE — Progress Notes (Signed)
[facility]  ROUNDING NOTE   Subjective:   Husband at bedside.   Creatinine 1.37 (0.95)  Right nephrostomy tube removed today. Patient with severe pain and complains of weakness and edema.   Objective:  Vital signs in last 24 hours:  Temp:  [98.2 F (36.8 C)-98.7 F (37.1 C)] 98.7 F (37.1 C) (06/25 1419) Pulse Rate:  [108-112] 108 (06/25 1419) Resp:  [20] 20 (06/25 1419) BP: (109-125)/(60-65) 109/60 (06/25 1419) SpO2:  [100 %] 100 % (06/25 1419)  Weight change:  Filed Weights   05/03/18 0800  Weight: 93 kg (205 lb)    Intake/Output: I/O last 3 completed shifts: In: 356 [P.O.:236; I.V.:20; IV Piggyback:100] Out: 775 [Urine:775]   Intake/Output this shift:  No intake/output data recorded.  Physical Exam: General: NAD,   Head: Normocephalic, atraumatic. Moist oral mucosal membranes  Eyes: Anicteric, PERRL  Neck: Supple, trachea midline  Lungs:  Clear to auscultation  Heart: Regular rate and rhythm  Abdomen:  Soft, nontender  Extremities: 1+ peripheral edema.  Neurologic: Nonfocal, moving all four extremities  Skin: No lesions        Basic Metabolic Panel: Recent Labs  Lab 05/11/18 1548 05/12/18 0517 05/13/18 0513 05/14/18 0554 05/15/18 0500 05/15/18 0611  NA 127* 128* 130* 128* 128*  --   K 3.8 3.3* 3.7 3.3* 3.4*  --   CL 91* 93* 95* 93* 93*  --   CO2 24 24 25 25 25   --   GLUCOSE 135* 142* 146* 122* 113*  --   BUN 38* 31* 21* 22* 24*  --   CREATININE 1.19* 1.09* 0.95 0.95 1.37*  --   CALCIUM 8.0* 8.0* 8.0* 7.9* 7.8*  --   MG  --   --   --   --   --  1.6*  PHOS  --   --   --   --   --  3.0    Liver Function Tests: Recent Labs  Lab 05/11/18 1548 05/12/18 0517 05/13/18 0513 05/14/18 0554 05/15/18 0500  AST 86* 63* 57* 52* 53*  ALT 56* 42 25 20 14   ALKPHOS 233* 215* 236* 215* 254*  BILITOT 0.9 0.9 0.7 0.7 0.7  PROT 6.3* 6.2* 6.0* 6.2* 6.5  ALBUMIN 3.0* 2.9* 2.5* 2.3* 2.6*   No results for input(s): LIPASE, AMYLASE in the  last 168 hours. No results for input(s): AMMONIA in the last 168 hours.  CBC: Recent Labs  Lab 05/10/18 0452 05/11/18 0406 05/12/18 0517 05/13/18 0513 05/14/18 0554 05/15/18 0500  WBC 20.4*  --  18.1* 14.9* 12.6* 11.1*  HGB 7.0* 9.2* 9.0* 8.3* 7.7* 7.6*  HCT 20.5* 25.6* 26.1* 24.5* 22.5* 21.9*  MCV 89.8  --  89.6 91.4 91.0 91.0  PLT 370  --  319 294 305 303    Cardiac Enzymes: No results for input(s): CKTOTAL, CKMB, CKMBINDEX, TROPONINI in the last 168 hours.  BNP: Invalid input(s): POCBNP  CBG: Recent Labs  Lab 05/13/18 1110  GLUCAP 145*    Microbiology: Results for orders placed or performed during the hospital encounter of 04/27/18  MRSA PCR Screening     Status: None   Collection Time: 04/27/18 10:27 PM  Result Value Ref Range Status   MRSA by PCR NEGATIVE NEGATIVE Final    Comment:        The GeneXpert MRSA Assay (FDA approved for NASAL specimens only), is one component of a comprehensive MRSA colonization surveillance program. It is not intended to diagnose MRSA infection nor  to guide or monitor treatment for MRSA infections. Performed at [facility], [address] [place], [place] [address]   CULTURE, BLOOD (ROUTINE X 2) w Reflex to ID Panel     Status: None   Collection Time: 04/28/18  9:24 PM  Result Value Ref Range Status   Specimen Description BLOOD RIGHT HAND  Final   Special Requests   Final    BOTTLES DRAWN AEROBIC AND ANAEROBIC Blood Culture adequate volume   Culture   Final    NO GROWTH 5 DAYS Performed at [facility], [address] [place], [place] [address]    Report Status 05/03/2018 FINAL  Final  CULTURE, BLOOD (ROUTINE X 2) w Reflex to ID Panel     Status: None   Collection Time: 04/28/18  9:44 PM  Result Value Ref Range Status   Specimen Description BLOOD RIGHT ANTECUBITAL  Final   Special Requests   Final    BOTTLES DRAWN AEROBIC AND ANAEROBIC Blood Culture adequate volume   Culture   Final    NO  GROWTH 5 DAYS Performed at [facility], [address], [place], [place] [address]    Report Status 05/03/2018 FINAL  Final  Urine Culture     Status: None   Collection Time: 05/09/18  4:30 PM  Result Value Ref Range Status   Specimen Description   Final    URINE, CATHETERIZED Performed at [facility], [address] [place], [place] [address]    Special Requests   Final    Normal Performed at [facility], [address] [place], [place] [address]    Culture   Final    NO GROWTH Performed at [facility], [address], [place], [place] [address]    Report Status 05/10/2018 FINAL  Final  CULTURE, BLOOD (ROUTINE X 2) w Reflex to ID Panel     Status: Abnormal   Collection Time: 05/09/18  6:02 PM  Result Value Ref Range Status   Specimen Description   Final    BLOOD LEFT HAND Performed at [facility], [address], [place], [place] [address]    Special Requests   Final    BOTTLES DRAWN AEROBIC AND ANAEROBIC Blood Culture adequate volume Performed at [facility], [address] [place], [place] [address]    Culture  Setup Time   Final    AEROBIC BOTTLE ONLY GRAM POSITIVE COCCI CRITICAL RESULT CALLED TO, READ BACK BY AND VERIFIED WITH: LISA KLUTTZ 05/10/18 1433 KLW    Culture (A)  Final    STAPHYLOCOCCUS SPECIES (COAGULASE NEGATIVE) THE SIGNIFICANCE OF ISOLATING THIS ORGANISM FROM A SINGLE SET OF BLOOD CULTURES WHEN MULTIPLE SETS ARE DRAWN IS UNCERTAIN. PLEASE NOTIFY THE MICROBIOLOGY DEPARTMENT WITHIN ONE WEEK IF SPECIATION AND SENSITIVITIES ARE REQUIRED. Performed at [facility], [address], [place], [place] [address]    Report Status 05/12/2018 FINAL  Final  Blood Culture ID Panel (Reflexed)     Status: Abnormal   Collection Time: 05/09/18  6:02 PM  Result Value Ref Range Status   Enterococcus species NOT DETECTED NOT DETECTED Final   Listeria monocytogenes NOT DETECTED NOT DETECTED Final    Staphylococcus species DETECTED (A) NOT DETECTED Final    Comment: Methicillin (oxacillin) susceptible coagulase negative staphylococcus. Possible blood culture contaminant (unless isolated from more than one blood culture draw or clinical case suggests pathogenicity). No antibiotic treatment is indicated for blood  culture contaminants. CRITICAL RESULT CALLED  TO, READ BACK BY AND VERIFIED WITH: LISA KLUTTZ 05/10/18 1433 KLW    Staphylococcus aureus NOT DETECTED NOT DETECTED Final   Methicillin resistance NOT DETECTED NOT DETECTED Final   Streptococcus species NOT DETECTED NOT DETECTED Final   Streptococcus agalactiae NOT DETECTED NOT DETECTED Final   Streptococcus pneumoniae NOT DETECTED NOT DETECTED Final   Streptococcus pyogenes NOT DETECTED NOT DETECTED Final   Acinetobacter baumannii NOT DETECTED NOT DETECTED Final   Enterobacteriaceae species NOT DETECTED NOT DETECTED Final   Enterobacter cloacae complex NOT DETECTED NOT DETECTED Final   Escherichia coli NOT DETECTED NOT DETECTED Final   Klebsiella oxytoca NOT DETECTED NOT DETECTED Final   Klebsiella pneumoniae NOT DETECTED NOT DETECTED Final   Proteus species NOT DETECTED NOT DETECTED Final   Serratia marcescens NOT DETECTED NOT DETECTED Final   Haemophilus influenzae NOT DETECTED NOT DETECTED Final   Neisseria meningitidis NOT DETECTED NOT DETECTED Final   Pseudomonas aeruginosa NOT DETECTED NOT DETECTED Final   Candida albicans NOT DETECTED NOT DETECTED Final   Candida glabrata NOT DETECTED NOT DETECTED Final   Candida krusei NOT DETECTED NOT DETECTED Final   Candida parapsilosis NOT DETECTED NOT DETECTED Final   Candida tropicalis NOT DETECTED NOT DETECTED Final    Comment: Performed at [facility], [address] [place], [place] [address]  CULTURE, BLOOD (ROUTINE X 2) w Reflex to ID Panel     Status: None   Collection Time: 05/09/18  7:00 PM  Result Value Ref Range Status   Specimen Description BLOOD RIGHT HAND   Final   Special Requests   Final    BOTTLES DRAWN AEROBIC AND ANAEROBIC Blood Culture adequate volume   Culture   Final    NO GROWTH 5 DAYS Performed at [facility], [address] [place], [place] [address]    Report Status 05/14/2018 FINAL  Final  CULTURE, BLOOD (ROUTINE X 2) w Reflex to ID Panel     Status: None   Collection Time: 05/10/18  7:10 PM  Result Value Ref Range Status   Specimen Description BLOOD LINE DRAW PER MD  Final   Special Requests   Final    BOTTLES DRAWN AEROBIC AND ANAEROBIC Blood Culture results may not be optimal due to an excessive volume of blood received in culture bottles   Culture   Final    NO GROWTH 5 DAYS Performed at [facility], [address] [place], [place] [address]    Report Status 05/15/2018 FINAL  Final    Coagulation Studies: No results for input(s): LABPROT, INR in the last 72 hours.  Urinalysis: No results for input(s): COLORURINE, LABSPEC, PHURINE, GLUCOSEU, HGBUR, BILIRUBINUR, KETONESUR, PROTEINUR, UROBILINOGEN, NITRITE, LEUKOCYTESUR in the last 72 hours.  Invalid input(s): APPERANCEUR    Imaging: No results found.   Medications:    . sodium chloride   Intravenous Once  . acetaminophen  650 mg Oral Once  . diphenhydrAMINE  25 mg Oral Once  . docusate sodium  100 mg Oral BID  . feeding supplement (ENSURE ENLIVE)  237 mL Oral TID BM  . furosemide  20 mg Intravenous Once  . mirabegron ER  50 mg Oral Daily  . mirtazapine  15 mg Oral QHS  . multivitamin with minerals  1 tablet Oral Daily  . nystatin  5 mL Oral QID  . pantoprazole  40 mg Oral Daily  . polyethylene glycol  17 g Oral Daily  . sodium chloride flush  10-40 mL Intracatheter Q12H  ALPRAZolam, calcium carbonate, HYDROmorphone (DILAUDID) injection, lidocaine, Melatonin, menthol-cetylpyridinium, [DISCONTINUED] ondansetron **OR** ondansetron (ZOFRAN) IV, opium-belladonna, oxyCODONE-acetaminophen, phenazopyridine, phenol, promethazine, sodium  chloride flush  Assessment/ Plan:  Ms. [name] is a 42 y.o. whtie female  post op abdominal hysterectomy, left salpingo-oophorectomy, laparoscopic lysis of adhesions and retroperitoneal dissection on 04/28/2018. Followed by cystoscopy with retrograde pyelogram and left ureteral stent placement on 04/29/2018 Baseline creatinine is 0.79 from 04/19/2018  1. Acute renal failure: secondary to ATN and obstructive uropathy. However this has improved. Creatinine has now bumped up. Most likely due to IV contrast exposure on 6/21.  Doubt this is due to re-obstruction.  - If creatinine does not improve, will check renal ultrasound  2. Generalized edema/Anasarca: with albumin of 2.6 - Encourage PO intake.   3. Hypokalemia: K 3.4 - Continue Potassium supplements.   4.  Hyponatremia.  Serum sodium improved to 128 - encourage PO intake.   5.  Anemia from blood loss: status post PRBC transfusion but now trending back down.  Retroperitoneal bleed seems unlikely.    Discussed case with Dr. Dalbert Garnet, Dr. Gilda Crease     LOS: [address] Kelsie Kramp 6/25/20198:33 PM

## 2018-05-15 NOTE — Progress Notes (Signed)
Patient urinated with nurse tech and it was reported to nurse that there were clots in the urine. Upon assessment by nurse there was urine and two blood clots in urine one large one about 3 inches long and one that was round about the size of a circumference of a quarter. OB/GYN and PA notified of clots in urine. Per PA Michiel CowboyShannon McGowan " that's normal with the nephrostomy tube coming out and it may be a good thing it could have been why she was in so much pain earlier because she could not pass the urine due to the clots". No new orders given just to continue to monitor the patient.

## 2018-05-15 NOTE — Progress Notes (Signed)
   05/15/18 1300  Clinical Encounter Type  Visited With Patient and family together  Visit Type Follow-up   Chaplain visited briefly with patient and her husband.  Patient says she's slowly getting better; husband hopes she can go home soon.  Short visit today so patient could rest.

## 2018-05-15 NOTE — Progress Notes (Signed)
Patient's nephrostomy tube was removed this morning by PA Michiel CowboyShannon McGowan. About an hour after removal patient began complaining of severe, sharp pain to area. PA notified via phone and recommended trying to get patient to urinate as she had not done so since removal. Patient placed on bedpan and only had small amount of blood from vaginal area. Patient given pain medication and PA came to assess patient. Per PA continue to monitor patient and give pain medications for pain. Patient continued to complain of pain and was treated with pain medication. Patient became concerned that she could not urinate, PA notified and asked for patient to be bladder scanned. Patient bladder scanned with scan only noting 59 ml of urine total. Patient now at bedside with PT and [facility]. Will continue to monitor.

## 2018-05-15 NOTE — Care Management (Signed)
RNCM reached out to admissions coordinator at inpatient rehab.  They have been following the case and will reach out to the patient and assess now that she has been participating more with PT

## 2018-05-15 NOTE — Progress Notes (Addendum)
Patient ID: [name], female   DOB: August 15, 1976, 42 y.o.   MRN: [id number] 17 Days Post-Op  Procedure(s):  1. 04/28/18: ATTEMPTED LAPAROSCOPIC HYSTERECTOMY CONVERTED TO SUPRACERVICAL ABDOMINAL HYSTERECTOMY (N/A) SALPINGO OOPHORECTOMY (Left) CYSTOSCOPY (N/A) UNILATERAL SALPINGECTOMY (Right) LAPAROSCOPIC LYSIS OF ADHESIONS, RETROPERITONEAL DISSECTION  2. 04/29/18: CYSTOSCOPY WITH RETROGRADE PYELOGRAM/Left URETERAL STENT PLACEMENT, ureteroscopy (Bilateral)    Subjective: Feeling discouraged Worked with PT today, but significant pain after removal of nephrostomy tube today. Voiding spontaneously with blood obscuring amount. Bladder scan with minimal retained.  Working with nutrition Hgb dropped again today, now 7.6  Albumin very low, s/p nutrition consult with recommendation for supra-dosing of protein  Cultures returned negative, will stop her ancef today  Objective: Vital signs in last 24 hours: Temp:  [98.2 F (36.8 C)-98.7 F (37.1 C)] 98.7 F (37.1 C) (06/25 1419) Pulse Rate:  [108-126] 108 (06/25 1419) Resp:  [20] 20 (06/25 1419) BP: (109-125)/(60-65) 109/60 (06/25 1419) SpO2:  [100 %] 100 % (06/25 1419)  Intake/Output  Intake/Output Summary (Last 24 hours) at 05/15/2018 1832 Last data filed at 05/15/2018 1000 Gross per 24 hour  Intake 356 ml  Output -  Net 356 ml    Physical Exam:  General: Oriented and responsive, appears more cheerful CV: Regular rhythm, no murmurs, regular rate Lungs: CTAB GI: Non-tender, non-distended. Normal bowel sounds. Ecchymosis over mons improving, and lap incisions clean and dry. Pitting edema to umbilicus.  Incisions: Clean and dry Urine: Pink, Foley in place Extremities: Nontender, no erythema, LE wrapped. Fingers, hands and arms are no longer edematous.  Lab Results: Recent Labs    05/13/18 0513 05/14/18 0554 05/15/18 0500  HGB 8.3* 7.7* 7.6*  HCT 24.5* 22.5* 21.9*  WBC 14.9* 12.6* 11.1*  PLT 294 305 303            Results for orders placed or performed during the hospital encounter of 04/27/18 (from the past 24 hour(s))  CBC     Status: Abnormal   Collection Time: 05/15/18  5:00 AM  Result Value Ref Range   WBC 11.1 (H) 3.6 - 11.0 K/uL   RBC 2.41 (L) 3.80 - 5.20 MIL/uL   Hemoglobin 7.6 (L) 12.0 - 16.0 g/dL   HCT 82.9 (L) 56.2 - 13.0 %   MCV 91.0 80.0 - 100.0 fL   MCH 31.6 26.0 - 34.0 pg   MCHC 34.8 32.0 - 36.0 g/dL   RDW 86.5 (H) 78.4 - 69.6 %   Platelets 303 150 - 440 K/uL  Comprehensive metabolic panel     Status: Abnormal   Collection Time: 05/15/18  5:00 AM  Result Value Ref Range   Sodium 128 (L) 135 - 145 mmol/L   Potassium 3.4 (L) 3.5 - 5.1 mmol/L   Chloride 93 (L) 98 - 111 mmol/L   CO2 25 22 - 32 mmol/L   Glucose, Bld 113 (H) 70 - 99 mg/dL   BUN 24 (H) 6 - 20 mg/dL   Creatinine, Ser 2.95 (H) 0.44 - 1.00 mg/dL   Calcium 7.8 (L) 8.9 - 10.3 mg/dL   Total Protein 6.5 6.5 - 8.1 g/dL   Albumin 2.6 (L) 3.5 - 5.0 g/dL   AST 53 (H) 15 - 41 U/L   ALT 14 0 - 44 U/L   Alkaline Phosphatase 254 (H) 38 - 126 U/L   Total Bilirubin 0.7 0.3 - 1.2 mg/dL   GFR calc non Af Amer 47 (L) >60 mL/min   GFR calc Af Amer 54 (L) >60 mL/min  Anion gap 10 5 - 15  Magnesium     Status: Abnormal   Collection Time: 05/15/18  6:11 AM  Result Value Ref Range   Magnesium 1.6 (L) 1.7 - 2.4 mg/dL  Phosphorus     Status: None   Collection Time: 05/15/18  6:11 AM  Result Value Ref Range   Phosphorus 3.0 2.5 - 4.6 mg/dL    Assessment/Plan: 17 Days Post-Op        Procedure(s): 1. 04/28/18: ATTEMPTED LAPAROSCOPIC HYSTERECTOMY CONVERTED TO SUPRACERVICAL ABDOMINAL HYSTERECTOMY (N/A) SALPINGO OOPHORECTOMY (Left) CYSTOSCOPY (N/A) UNILATERAL SALPINGECTOMY (Right) LAPAROSCOPIC LYSIS OF ADHESIONS, RETROPERITONEAL DISSECTION   2. 04/29/18: CYSTOSCOPY WITH RETROGRADE PYELOGRAM/Left URETERAL STENT PLACEMENT, ureteroscopy (Bilateral)  IMP: Generalized edema Anemia ATN - resolved  PLAN/REC:  - Vascular surgery  for f/u edema, DVTs - Urology for management of hematuria, hydro - Nephrology for management of electrolyte abnormalities and monitor renal function  - Anemia: still losing. Will replace with another transfusion, but will stop heparin after discussion with vascular. However, will need to confirm with vascular surgery. Does not appear to have blood in peritoneal cavity based on physical exam. If vascular repeats CT scan, would like to review pelvis for blood.  - Continuing to need pain meds for management of movement. Discussed need for titration off at home.  - Is using dilaudid as needed, but is using every 4 hrs. Will decrease use to every 6 hours, and use oxycodone po instead.  - May need to go to SNF to build up conditioning to rehab.  Amy DouglasBethany Gittel Mccamish, MD Cell phone: [phone]   LOS: 18 days   Amy DouglasBethany Reyanna [name] 05/15/2018, 6:32 PMPatient ID: Amy SidleGinger B [name], female   DOB: 10/17/1976, 42 y.o.   MRN: [id number]

## 2018-05-15 NOTE — Progress Notes (Signed)
[facility]  Daily Progress Note   Subjective  - 17 Days Post-Op  Patient evaluated earlier today, at the time I entered the room she is lying in bed left side down complaining of severe pain.  Her nephrostomy tube had recently been removed.  She is still having significant pain from her right leg as well.  Both of her legs remain fairly swollen and as she describes it "tight".  She is not tolerating the Ace wraps as they " her too much"  Objective Vitals:   05/14/18 2007 05/15/18 0245 05/15/18 0411 05/15/18 1419  BP: 115/65  125/65 109/60  Pulse: (!) 126 (!) 108 (!) 112 (!) 108  Resp: 20  20 20   Temp: 98.6 F (37 C)  98.2 F (36.8 C) 98.7 F (37.1 C)  TempSrc: Oral  Oral Oral  SpO2: 100%  100% 100%  Weight:      Height:        Intake/Output Summary (Last 24 hours) at 05/15/2018 1835 Last data filed at 05/15/2018 1000 Gross per 24 hour  Intake 356 ml  Output -  Net 356 ml    PULM  Normal effort , no use of accessory muscles CV  No JVD, RRR Abd      No distended, nontender VASC  both legs demonstrate 4+ edema the duskiness of the toes on the right and left foot have resolved and her feet and toes show 1 second capillary refill a significant improvement over Friday.  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  This is most unusual and I do not have an explanation as to why her right leg symptoms would acutely worsen if she is still keeping her elevated.  The Ace wraps would be very helpful but she is not tolerating them.  Although her bleeding seems to have slowed she is still markedly anemic this morning and on her most recent labs her hemoglobin is down slightly from yesterday.  Given her recent transfusions her hemoglobin in the 7.0 range I do not think full anticoagulation is appropriate at this time.  Certainly if her hemoglobin were to stabilize and there is no further evidence of bleeding anticoagulation would be a significant step toward treating her venous outflow obstruction.  I did have conversation with her husband by phone yesterday evening and he was inquiring regarding stenting.  This is something that should be investigated but can only be done so in the face of the ability to not just anticoagulate but give supratherapeutic doses  of heparin during the intervention.  Given the chronicity of her iliac and vena caval occlusion the ability to even cross these lesions is not great but there is no urgency to performing this procedure in fact intervention at this time would be quite counterproductive at this point in time giving the recent hemorrhagic complications.  Also her hemoglobin has continued to deteriorate and I would agree with transfusion.  Her nutritional status is also of concern and this is being addressed with increased protein intake nevertheless her albumin is now down to 2.    I will continue to follow and will plan starting anticoagulation once her hematologic issues have stabilized.    Levora DredgeGregory Schnier  05/15/2018, 6:35 PM

## 2018-05-15 NOTE — Progress Notes (Signed)
Inpatient Rehabilitation Admissions Coordinator  I contacted patient by phone and spoke with her spouse. He states they have discussed her rehab needs and [facility] and do not want me to pursue [facility] [place] her [employer]. He feels they have caregiver support at home and that she will do better in her home set up. I updated RN CM.  Ottie GlazierBarbara Ajanay Farve, RN, MSN Rehab Admissions Coordinator [phone] 05/15/2018 11:49 AM

## 2018-05-15 NOTE — Progress Notes (Signed)
Physical Therapy Treatment Patient Details Name: Amy SidleGinger B Standen MRN: [id number] DOB: 02/05/1976 Today's Date: 05/15/2018    History of Present Illness Pt is a 42 y/o F s/p laparascopic hysterectomy, Bil aslpingectomy 6/7.  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  Developed hematuria and L hydronephrosis 06/09 requiring cystoscopy and left ureteral stent placement. Renal US 6/10 revealed right hydronephrosis. S/P R percutaneous nephrostomy tube placement 6/10.  NG tube placed 6/10.  Pt with AKI and ileus.  Pt's PMH includes DVT.   Now with extended and complicated hospital stay, followed by OB/GYN, vascular, renal and urology for post-op management.  Cleared by physician (vascular, in particular) for participation with therapy as tolerated without restriction (per Dr. Montel ClockEscho 05/13/18)    PT Comments    Pt agreed on third attempt.  Primary nurse gave anxiety medication before session.  Pt discouraged today stating she has has a bad day with increased pain after drain removed earlier today.  Pt requesting to use bedside commode to void but anxiety increased over fear of not being able to go.  Encouragement given and she was assisted to edge of bed with mod a x 2.  Some increased difficulty today due to edema and general anxiety.  Once sitting she was able to stand on 3rd attempt with walker and mod a x 2.  She transferred to recliner at bedside with min a x 2.  She remained on commode and was able to void.  She requested and received a bath from nurse tech.  Once finished she was encouraged to ambulate.  Initially declined, but agreed with encouragement.  She was able to walk a small lap in the room before returning to bed with mod/max a x 2 to return to supine.  She refused to remain up in recliner despite encouragement.  Pt with good effort during session today but overall seemed fatigued and frustrated with  continued health issues "I take one step forward to take 2 steps back"  Encouragement provided.  Husband in and supportive.   Pt would continue to benefit from CIR but declines.  Upon return home she will need +2 assist for mobility with HHPT to increase overall functional mobility.     Follow Up Recommendations  CIR     Equipment Recommendations  Rolling walker with 5" wheels;3in1 (PT);Wheelchair (measurements PT);Wheelchair cushion (measurements PT);Hospital bed;Other (comment)    Recommendations for Other Services       Precautions / Restrictions Precautions Precautions: Fall Precaution Comments: R nephrostomy tube, L IJ CVC line Restrictions Weight Bearing Restrictions: No    Mobility  Bed Mobility Overal bed mobility: Needs Assistance Bed Mobility: Supine to Sit     Supine to sit: Mod assist;+2 for physical assistance Sit to supine: Mod assist;+2 for physical assistance      Transfers Overall transfer level: Needs assistance Equipment used: Rolling walker (2 wheeled) Transfers: Sit to/from Stand Sit to Stand: Mod assist;+2 physical assistance            Ambulation/Gait Ambulation/Gait assistance: Min assist;+2 physical assistance Gait Distance (Feet): 10 Feet Assistive device: Rolling walker (2 wheeled) Gait Pattern/deviations: Step-to pattern;Decreased step length - right;Decreased step length - left Gait velocity: decreased       Stairs             Wheelchair Mobility    Modified Rankin (Stroke Patients Only)       Balance Overall balance assessment: Needs  assistance Sitting-balance support: Bilateral upper extremity supported;Feet supported Sitting balance-Leahy Scale: Fair Sitting balance - Comments: limited by edema   Standing balance support: Bilateral upper extremity supported Standing balance-Leahy Scale: Fair Standing balance comment: no buckling or LOB noted.  heavy reliance on walker                             Cognition Arousal/Alertness: Awake/alert Behavior During Therapy: WFL for tasks assessed/performed;Anxious Overall Cognitive Status: Within Functional Limits for tasks assessed                                        Exercises Other Exercises Other Exercises: to commode to void and for bathing with nurse tech    General Comments        Pertinent Vitals/Pain Pain Assessment: 0-10 Pain Score: 5  Pain Location: R upper flank from where drain was removed this am Pain Descriptors / Indicators: Aching;Grimacing;Constant Pain Intervention(s): Limited activity within patient's tolerance;Monitored during session;Other (comment)    Home Living                      Prior Function            PT Goals (current goals can now be found in the care plan section) Progress towards PT goals: Progressing toward goals    Frequency    7X/week      PT Plan Current plan remains appropriate    Co-evaluation              AM-PAC PT "6 Clicks" Daily Activity  Outcome Measure  Difficulty turning over in bed (including adjusting bedclothes, sheets and blankets)?: Unable Difficulty moving from lying on back to sitting on the side of the bed? : Unable Difficulty sitting down on and standing up from a chair with arms (e.g., wheelchair, bedside commode, etc,.)?: Unable Help needed moving to and from a bed to chair (including a wheelchair)?: A Lot Help needed walking in hospital room?: A Lot Help needed climbing 3-5 steps with a railing? : Total 6 Click Score: 8    End of Session Equipment Utilized During Treatment: Gait belt Activity Tolerance: Patient tolerated treatment well;Patient limited by fatigue Patient left: in bed;with call bell/phone within reach;with family/visitor present;with nursing/sitter in room         Time: 4098-1191 PT Time Calculation (min) (ACUTE ONLY): 38 min  Charges:  $Gait Training: 8-22 mins $Therapeutic Exercise: 23-37  mins                    G Codes:       Danielle Dess, PTA 05/15/18, 4:28 PM

## 2018-05-15 NOTE — Care Management (Signed)
RNCM met with patient, along with husband at bedside. They wish not to purse inpatient rehab, and continue with the plan for a home discharge. Patient and husband both state that they have plenty of family support and "we should only need home health and some equipment".  Per husband he has confirmed they have a RW, shower seat and lift chair in the home.  Husband states "I think the RW we have is a wide one, it looks like a big one sitting there in the living room". RNCM following for discharge needs.

## 2018-05-15 NOTE — Progress Notes (Signed)
Urology Consult Follow Up  Subjective: Patient states she had a good night and finally could get some sleep.  She is ready to have nephrostomy tube removed.    Anti-infectives: Anti-infectives (From admission, onward)   Start     Dose/Rate Route Frequency Ordered Stop   05/10/18 1500  ceFAZolin (ANCEF) IVPB 2g/100 mL premix     2 g 200 mL/hr over 30 Minutes Intravenous Every 8 hours 05/10/18 1453     05/06/18 2200  meropenem (MERREM) 1 g in sodium chloride 0.9 % 100 mL IVPB  Status:  Discontinued     1 g 200 mL/hr over 30 Minutes Intravenous Every 12 hours 05/06/18 1305 05/07/18 0845   05/02/18 1000  meropenem (MERREM) 500 mg in sodium chloride 0.9 % 100 mL IVPB  Status:  Discontinued     500 mg 200 mL/hr over 30 Minutes Intravenous Every 12 hours 05/02/18 0911 05/06/18 1305   05/01/18 2200  piperacillin-tazobactam (ZOSYN) IVPB 3.375 g  Status:  Discontinued     3.375 g 12.5 mL/hr over 240 Minutes Intravenous Every 12 hours 05/01/18 1952 05/02/18 0838   04/30/18 2200  piperacillin-tazobactam (ZOSYN) IVPB 3.375 g  Status:  Discontinued     3.375 g 12.5 mL/hr over 240 Minutes Intravenous Every 12 hours 04/30/18 0833 05/01/18 1509   04/29/18 0330  piperacillin-tazobactam (ZOSYN) IVPB 3.375 g  Status:  Discontinued     3.375 g 12.5 mL/hr over 240 Minutes Intravenous Every 8 hours 04/29/18 0317 04/30/18 0833   04/27/18 0615  ceFAZolin (ANCEF) 2-4 GM/100ML-% IVPB    Note to Pharmacy:  Lorrene ReidJackson, Pamela   : cabinet override      04/27/18 0615 04/27/18 0746   04/27/18 0600  ceFAZolin (ANCEF) IVPB 2g/100 mL premix     2 g 200 mL/hr over 30 Minutes Intravenous On call to O.R. 04/26/18 2240 04/27/18 1142      Current Facility-Administered Medications  Medication Dose Route Frequency Provider Last Rate Last Dose  . ALPRAZolam Prudy Feeler(XANAX) tablet 1 mg  1 mg Oral TID PRN Christeen DouglasBeasley, Bethany, MD   1 mg at 05/14/18 2155  . calcium carbonate (TUMS - dosed in mg elemental calcium) chewable tablet 200 mg of  elemental calcium  1 tablet Oral 5 X Daily PRN Christeen DouglasBeasley, Bethany, MD   200 mg of elemental calcium at 05/12/18 1235  . ceFAZolin (ANCEF) IVPB 2g/100 mL premix  2 g Intravenous Q8H Christeen DouglasBeasley, Bethany, MD   Stopped at 05/15/18 [id number]  . docusate sodium (COLACE) capsule 100 mg  100 mg Oral BID Christeen DouglasBeasley, Bethany, MD   100 mg at 05/14/18 2156  . feeding supplement (ENSURE ENLIVE) (ENSURE ENLIVE) liquid 237 mL  237 mL Oral TID BM Merwyn KatosSimonds, David B, MD   237 mL at 05/14/18 2040  . heparin injection 5,000 Units  5,000 Units Subcutaneous Q8H Merwyn KatosSimonds, David B, MD   5,000 Units at 05/15/18 0556  . HYDROmorphone (DILAUDID) injection 0.5 mg  0.5 mg Intravenous Q4H PRN Merwyn KatosSimonds, David B, MD   0.5 mg at 05/15/18 0926  . lidocaine (XYLOCAINE) 5 % ointment   Topical BID PRN Merwyn KatosSimonds, David B, MD      . Melatonin TABS 5 mg  5 mg Oral QHS PRN Tukov-Yual, Magdalene S, NP   5 mg at 05/08/18 0019  . menthol-cetylpyridinium (CEPACOL) lozenge 3 mg  1 lozenge Oral Q2H PRN Ray ChurchBell, Eugene D III, MD      . mirabegron [facility](MYRBETRIQ) tablet 50 mg  50 mg Oral Daily Apolinar JunesBrandon,  Morrie Sheldon, MD   50 mg at 05/14/18 1517  . mirtazapine (REMERON) tablet 15 mg  15 mg Oral QHS Clapacs, Jackquline Denmark, MD   15 mg at 05/14/18 2156  . multivitamin with minerals tablet 1 tablet  1 tablet Oral Daily Christeen Douglas, MD   1 tablet at 05/14/18 1054  . nystatin (MYCOSTATIN) 100000 UNIT/ML suspension 500,000 Units  5 mL Oral QID Conforti, John, DO   500,000 Units at 05/14/18 2156  . ondansetron (ZOFRAN) injection 4 mg  4 mg Intravenous Q6H PRN Ray Church III, MD   4 mg at 05/07/18 2052  . opium-belladonna (B&O SUPPRETTES) 16.2-60 MG suppository 1 suppository  1 suppository Rectal Q8H PRN Rene Paci, MD   1 suppository at 05/12/18 1123  . oxyCODONE-acetaminophen (PERCOCET/ROXICET) 5-325 MG per tablet 1-2 tablet  1-2 tablet Oral Q6H PRN Christeen Douglas, MD   2 tablet at 05/15/18 0556  . pantoprazole (PROTONIX) EC tablet 40 mg  40 mg Oral Daily Christeen Douglas, MD   40 mg at 05/14/18 1054  . phenazopyridine (PYRIDIUM) tablet 200 mg  200 mg Oral Q8H PRN Rene Paci, MD   200 mg at 05/11/18 0004  . phenol (CHLORASEPTIC) mouth spray 1 spray  1 spray Mouth/Throat PRN Merwyn Katos, MD      . polyethylene glycol (MIRALAX / GLYCOLAX) packet 17 g  17 g Oral Daily Christeen Douglas, MD   17 g at 05/14/18 1052  . promethazine (PHENERGAN) injection 12.5-25 mg  12.5-25 mg Intravenous Q6H PRN Tukov-Yual, Magdalene S, NP   25 mg at 05/03/18 0000  . sodium chloride flush (NS) 0.9 % injection 10-40 mL  10-40 mL Intracatheter Q12H Eugenie Norrie, NP   10 mL at 05/14/18 2157  . sodium chloride flush (NS) 0.9 % injection 10-40 mL  10-40 mL Intracatheter PRN Eugenie Norrie, NP   20 mL at 05/11/18 1019     Objective: Vital signs in last 24 hours: Temp:  [97.9 F (36.6 C)-98.6 F (37 C)] 98.2 F (36.8 C) (06/25 0411) Pulse Rate:  [105-126] 112 (06/25 0411) Resp:  [18-20] 20 (06/25 0411) BP: (115-125)/(55-65) 125/65 (06/25 0411) SpO2:  [100 %] 100 % (06/25 0411)  Intake/Output from previous day: 06/24 0701 - 06/25 0700 In: 336 [P.O.:236; IV Piggyback:100] Out: 775 [Urine:775] Intake/Output this shift: No intake/output data recorded.   Physical Exam Constitutional: Well nourished. Alert and oriented, No acute distress. HEENT: [place] AT, moist mucus membranes. Trachea midline, no masses. Cardiovascular: No clubbing, cyanosis, or edema. Respiratory: Normal respiratory effort, no increased work of breathing. GI: Abdomen is soft, non tender, non distended, no abdominal masses. Liver and spleen not palpable.  No hernias appreciated.  Stool sample for occult testing is not indicated.   GU: No CVA tenderness.  No bladder fullness or masses.  Patient with circumcised/uncircumcised phallus.   Nephrostomy site is clean and dry  Skin: No rashes, bruises or suspicious lesions. Lymph: No cervical or inguinal adenopathy. Neurologic: Grossly  intact, no focal deficits, moving all 4 extremities. Psychiatric: Normal mood and affect.  Lab Results:  Recent Labs    05/14/18 0554 05/15/18 0500  WBC 12.6* 11.1*  HGB 7.7* 7.6*  HCT 22.5* 21.9*  PLT 305 303   BMET Recent Labs    05/14/18 0554 05/15/18 0500  NA 128* 128*  K 3.3* 3.4*  CL 93* 93*  CO2 25 25  GLUCOSE 122* 113*  BUN 22* 24*  CREATININE 0.95 1.37*  CALCIUM 7.9*  7.8*   PT/INR No results for input(s): LABPROT, INR in the last 72 hours. ABG No results for input(s): PHART, HCO3 in the last 72 hours.  Invalid input(s): PCO2, PO2  Studies/Results: No results found.  Procedure Dressing removed from nephrostomy site.  Suture is removed and tube is extracted.  Incision site is dressed with 4 x 4 gauze and Tegaderm.    Assessment and Plan Acute kidney injury  Nephrostomy tube removed today    Continue to monitor UOP   Will continue to follow   Patient will need follow up upon discharge with urology          LOS: 18 days    [facility] 05/15/2018

## 2018-05-16 ENCOUNTER — Inpatient Hospital Stay: Payer: BLUE CROSS/BLUE SHIELD

## 2018-05-16 DIAGNOSIS — R600 Localized edema: Secondary | ICD-10-CM

## 2018-05-16 DIAGNOSIS — I82220 Acute embolism and thrombosis of inferior vena cava: Secondary | ICD-10-CM

## 2018-05-16 LAB — RENAL FUNCTION PANEL
ALBUMIN: 2.3 g/dL — AB (ref 3.5–5.0)
Anion gap: 11 (ref 5–15)
BUN: 33 mg/dL — ABNORMAL HIGH (ref 6–20)
CO2: 24 mmol/L (ref 22–32)
Calcium: 7.8 mg/dL — ABNORMAL LOW (ref 8.9–10.3)
Chloride: 93 mmol/L — ABNORMAL LOW (ref 98–111)
Creatinine, Ser: 2.3 mg/dL — ABNORMAL HIGH (ref 0.44–1.00)
GFR, EST AFRICAN AMERICAN: 29 mL/min — AB (ref >60)
GFR, EST NON AFRICAN AMERICAN: 25 mL/min — AB (ref >60)
Glucose, Bld: 123 mg/dL — ABNORMAL HIGH (ref 70–99)
POTASSIUM: 4 mmol/L (ref 3.5–5.1)
Phosphorus: 3.7 mg/dL (ref 2.5–4.6)
Sodium: 128 mmol/L — ABNORMAL LOW (ref 135–145)

## 2018-05-16 LAB — HEMOGLOBIN AND HEMATOCRIT, BLOOD
HEMATOCRIT: 27.1 % — AB (ref 35.0–47.0)
Hemoglobin: 9.5 g/dL — ABNORMAL LOW (ref 12.0–16.0)

## 2018-05-16 MED ORDER — FLUTICASONE PROPIONATE 50 MCG/ACT NA SUSP
1.0000 | NASAL | Status: DC | PRN
  Filled 2018-05-16: qty 16

## 2018-05-16 MED ORDER — FUROSEMIDE 10 MG/ML IJ SOLN
40.0000 mg | Freq: Once | INTRAMUSCULAR | Status: AC
  Administered 2018-05-16: 40 mg via INTRAVENOUS
  Filled 2018-05-16: qty 4

## 2018-05-16 MED ORDER — HYDROMORPHONE HCL 1 MG/ML IJ SOLN
0.5000 mg | Freq: Three times a day (TID) | INTRAMUSCULAR | Status: DC | PRN
  Administered 2018-05-16 – 2018-05-19 (×8): 0.5 mg via INTRAVENOUS
  Filled 2018-05-16 (×8): qty 0.5

## 2018-05-16 NOTE — Progress Notes (Signed)
Verbal orders from nephrology to not insert foley if pt does not void by 2030 05/16/2018, that cystoscopy on right is being considered. Pt is updated and verbalized understanding. RN will continue to monitor pt.   [name] [place]

## 2018-05-16 NOTE — Progress Notes (Signed)
Occupational Therapy Treatment Patient Details Name: [name] MRN: [id number] DOB: 05/31/1976 Today's Date: 05/16/2018    History of present illness Pt is a 42 y/o F s/p laparascopic hysterectomy, Bil aslpingectomy 6/7.  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  Developed hematuria and L hydronephrosis 06/09 requiring cystoscopy and left ureteral stent placement. Renal US 6/10 revealed right hydronephrosis. S/P R percutaneous nephrostomy tube placement 6/10.  NG tube placed 6/10.  Pt with AKI and ileus.  Pt's PMH includes DVT.   Now with extended and complicated hospital stay, followed by OB/GYN, vascular, renal and urology for post-op management.  Cleared by physician (vascular, in particular) for participation with therapy as tolerated without restriction (per Dr. Montel Clock 05/13/18)   OT comments  Pt seen for co-tx session with PT this date. Pt required encouragement and was provided with pain meds and anxiety meds prior to session to support maximal participation. Pt requiring mod to max assist supine to sit; mod to max assist x2 to stand up to bariatric RW (pt requiring 2 attempts to stand each time). HR 120-140's bpm during session but briefly up to 148 bpm with activity. Pt transferred to [facility] and then stood requiring max assist for hygiene while in standing (unable to reach behind her for peri hygiene due to significant edema, impaired activity tolerance, generalized weakness, and impaired standing balance requiring CGA assist from PT while OT performed hygiene). Once at sink, pt unable to stand without bilateral forearm support on the counter to wash face with a washcloth. Pt only tolerated standing for <2 minutes before she urgently requested to sit back down. Pt progressing slowly towards goals, continues to benefit from skilled OT services. Discharge recommendation updated. Pt will require  significant assist for all aspects of mobility and ADL tasks and is a very high falls risk. Pt is most appropriate for STR upon discharge.    Follow Up Recommendations  SNF    Equipment Recommendations  Other (comment);Hospital bed;3 in 1 bedside commode;Wheelchair cushion (measurements OT);Wheelchair (measurements OT)(bari BSC, leg lifter, reacher, sock aide)    Recommendations for Other Services      Precautions / Restrictions Precautions Precautions: Fall Precaution Comments: L IJ CVC; abdominal drain and incision sites Restrictions Weight Bearing Restrictions: No       Mobility Bed Mobility Overal bed mobility: Needs Assistance Bed Mobility: Supine to Sit     Supine to sit: Mod assist;Max assist Sit to supine: Mod assist;+2 for physical assistance   General bed mobility comments: assist for B LE's and trunk supine to sit; heavy use of bedrail; increased time to perform  Transfers Overall transfer level: Needs assistance Equipment used: Rolling walker (2 wheeled) Transfers: Sit to/from [employer] Sit to Stand: Mod assist;Max assist;+2 physical assistance Stand pivot transfers: Min assist;+2 physical assistance       General transfer comment: vc's and assist for B LE placement; cueing for UE placement; assist to initiate and come to full stand    Balance Overall balance assessment: Needs assistance Sitting-balance support: Feet supported;No upper extremity supported Sitting balance-Leahy Scale: Good Sitting balance - Comments: steady sitting reaching within BOS    Standing balance support: Single extremity supported Standing balance-Leahy Scale: Poor Standing balance comment: requires B UE support to maintain static standing balance (leaning forearms on sink counter washing face with wash cloth)  ADL either performed or assessed with clinical judgement   ADL Overall ADL's : Needs assistance/impaired      Grooming: Standing;Min guard;Wash/dry face Grooming Details (indicate cue type and reason): standing a sink with BUE support on counter pt able to stand <2 minutes with CGA +2 while standing within [employer] Transfer: +2 for physical assistance;Moderate assistance;Ambulation;BSC;RW;Requires wide/bariatric;Maximal assistance Toilet Transfer Details (indicate cue type and reason): mod-max assist +2 for transfer from bari [facility] Toileting- Clothing Manipulation and Hygiene: Maximal assistance;Sit to/from stand       Functional mobility during ADLs: Minimal assistance;Rolling walker;+2 for physical assistance General ADL Comments: bari RW, max encouragement, mod verbal cues for RW     Vision Patient Visual Report: No change from baseline     Perception     Praxis      Cognition Arousal/Alertness: Awake/alert Behavior During Therapy: Anxious Overall Cognitive Status: Within Functional Limits for tasks assessed                                 General Comments: Fearful during mobility efforts requiring support and encouragement        Exercises     Shoulder Instructions       General Comments      Pertinent Vitals/ Pain       Pain Assessment: 0-10 Pain Score: 8  Pain Location: 6/10 at start of session in mid L lower abdominal area increasing to 8/10 by end of session Pain Descriptors / Indicators: Aching;Grimacing;Constant Pain Intervention(s): Limited activity within patient's tolerance;Monitored during session;Premedicated before session;Repositioned  Home Living                                          Prior Functioning/Environment              Frequency  Min 3X/week        Progress Toward Goals  OT Goals(current goals can now be found in the care plan section)  Progress towards OT goals: Progressing toward goals  Acute Rehab OT Goals Patient Stated Goal: to improve mobility OT Goal  Formulation: With patient Time For Goal Achievement: 05/28/18 Potential to Achieve Goals: Good  Plan Discharge plan needs to be updated;Frequency remains appropriate    Co-evaluation    PT/OT/SLP Co-Evaluation/Treatment: Yes Reason for Co-Treatment: For patient/therapist safety;To address functional/ADL transfers PT goals addressed during session: Mobility/safety with mobility;Balance;Proper use of DME;Strengthening/ROM OT goals addressed during session: ADL's and self-care;Proper use of Adaptive equipment and DME;Strengthening/ROM      AM-PAC PT "6 Clicks" Daily Activity     Outcome Measure   Help from another person eating meals?: None Help from another person taking care of personal grooming?: None Help from another person toileting, which includes using toliet, bedpan, or urinal?: A Lot Help from another person bathing (including washing, rinsing, drying)?: A Lot Help from another person to put on and taking off regular upper body clothing?: A Little Help from another person to put on and taking off regular lower body clothing?: A Lot 6 Click Score: 17    End of Session Equipment Utilized During Treatment: Gait belt;Rolling walker  OT Visit Diagnosis: Other abnormalities of gait and mobility (R26.89);Muscle weakness (generalized) (M62.81)   Activity Tolerance Patient  tolerated treatment well   Patient Left in chair;with call bell/phone within reach;with chair alarm set;with family/visitor present;Other (comment)(BLE elevated, ace wraps in place)   Nurse Communication          Time: [id number] OT Time Calculation (min): 36 min  Charges: OT General Charges $OT Visit: 1 Visit OT Treatments $Self Care/Home Management : 8-22 mins  Richrd PrimeJamie Stiller, MPH, MS, OTR/L ascom [phone] 05/16/18, 3:17 PM

## 2018-05-16 NOTE — Progress Notes (Signed)
Urology Consult Follow Up  Subjective: Patient sitting up eating chicken dumplings from [employer].  Creatinine at 2.30.  UOP diminished.    Anti-infectives: Anti-infectives (From admission, onward)   Start     Dose/Rate Route Frequency Ordered Stop   05/10/18 1500  ceFAZolin (ANCEF) IVPB 2g/100 mL premix  Status:  Discontinued     2 g 200 mL/hr over 30 Minutes Intravenous Every 8 hours 05/10/18 1453 05/15/18 1227   05/06/18 2200  meropenem (MERREM) 1 g in sodium chloride 0.9 % 100 mL IVPB  Status:  Discontinued     1 g 200 mL/hr over 30 Minutes Intravenous Every 12 hours 05/06/18 1305 05/07/18 0845   05/02/18 1000  meropenem (MERREM) 500 mg in sodium chloride 0.9 % 100 mL IVPB  Status:  Discontinued     500 mg 200 mL/hr over 30 Minutes Intravenous Every 12 hours 05/02/18 0911 05/06/18 1305   05/01/18 2200  piperacillin-tazobactam (ZOSYN) IVPB 3.375 g  Status:  Discontinued     3.375 g 12.5 mL/hr over 240 Minutes Intravenous Every 12 hours 05/01/18 1952 05/02/18 0838   04/30/18 2200  piperacillin-tazobactam (ZOSYN) IVPB 3.375 g  Status:  Discontinued     3.375 g 12.5 mL/hr over 240 Minutes Intravenous Every 12 hours 04/30/18 0833 05/01/18 1509   04/29/18 0330  piperacillin-tazobactam (ZOSYN) IVPB 3.375 g  Status:  Discontinued     3.375 g 12.5 mL/hr over 240 Minutes Intravenous Every 8 hours 04/29/18 0317 04/30/18 0833   04/27/18 0615  ceFAZolin (ANCEF) 2-4 GM/100ML-% IVPB    Note to Pharmacy:  Lorrene Reid   : cabinet override      04/27/18 0615 04/27/18 0746   04/27/18 0600  ceFAZolin (ANCEF) IVPB 2g/100 mL premix     2 g 200 mL/hr over 30 Minutes Intravenous On call to O.R. 04/26/18 2240 04/27/18 1142      Current Facility-Administered Medications  Medication Dose Route Frequency Provider Last Rate Last Dose  . 0.9 %  sodium chloride infusion (Manually program via Guardrails IV Fluids)   Intravenous Once Christeen Douglas, MD      . ALPRAZolam Prudy Feeler) tablet 1 mg  1 mg  Oral TID PRN Christeen Douglas, MD   1 mg at 05/16/18 1131  . calcium carbonate (TUMS - dosed in mg elemental calcium) chewable tablet 200 mg of elemental calcium  1 tablet Oral 5 X Daily PRN Christeen Douglas, MD   200 mg of elemental calcium at 05/12/18 1235  . docusate sodium (COLACE) capsule 100 mg  100 mg Oral BID Christeen Douglas, MD   100 mg at 05/16/18 1027  . feeding supplement (ENSURE ENLIVE) (ENSURE ENLIVE) liquid 237 mL  237 mL Oral TID BM Merwyn Katos, MD   237 mL at 05/16/18 1027  . HYDROmorphone (DILAUDID) injection 0.5 mg  0.5 mg Intravenous Q6H PRN Christeen Douglas, MD   0.5 mg at 05/16/18 0807  . lidocaine (XYLOCAINE) 5 % ointment   Topical BID PRN Merwyn Katos, MD      . Melatonin TABS 5 mg  5 mg Oral QHS PRN Tukov-Yual, Magdalene S, NP   5 mg at 05/08/18 0019  . menthol-cetylpyridinium (CEPACOL) lozenge 3 mg  1 lozenge Oral Q2H PRN Ray Church III, MD      . mirabegron ER [facility]) tablet 50 mg  50 mg Oral Daily Vanna Scotland, MD   50 mg at 05/15/18 1035  . mirtazapine (REMERON) tablet 15 mg  15 mg Oral QHS Clapacs,  Jackquline DenmarkJohn T, MD   15 mg at 05/15/18 2042  . multivitamin with minerals tablet 1 tablet  1 tablet Oral Daily Christeen DouglasBeasley, Bethany, MD   1 tablet at 05/16/18 1027  . nystatin (MYCOSTATIN) 100000 UNIT/ML suspension 500,000 Units  5 mL Oral QID Conforti, John, DO   500,000 Units at 05/16/18 1027  . ondansetron (ZOFRAN) injection 4 mg  4 mg Intravenous Q6H PRN Ray ChurchBell, Eugene D III, MD   4 mg at 05/16/18 0141  . opium-belladonna (B&O SUPPRETTES) 16.2-60 MG suppository 1 suppository  1 suppository Rectal Q8H PRN Rene PaciWinter, Christopher Aaron, MD   1 suppository at 05/15/18 1135  . oxyCODONE-acetaminophen (PERCOCET/ROXICET) 5-325 MG per tablet 1-2 tablet  1-2 tablet Oral Q6H PRN Christeen DouglasBeasley, Bethany, MD   2 tablet at 05/16/18 1134  . pantoprazole (PROTONIX) EC tablet 40 mg  40 mg Oral Daily Christeen DouglasBeasley, Bethany, MD   40 mg at 05/16/18 1027  . phenazopyridine (PYRIDIUM) tablet 200 mg   200 mg Oral Q8H PRN Rene PaciWinter, Christopher Aaron, MD   200 mg at 05/11/18 0004  . phenol (CHLORASEPTIC) mouth spray 1 spray  1 spray Mouth/Throat PRN Merwyn KatosSimonds, David B, MD      . polyethylene glycol (MIRALAX / GLYCOLAX) packet 17 g  17 g Oral Daily Christeen DouglasBeasley, Bethany, MD   17 g at 05/16/18 1027  . promethazine (PHENERGAN) injection 12.5-25 mg  12.5-25 mg Intravenous Q6H PRN Tukov-Yual, Magdalene S, NP   25 mg at 05/03/18 0000  . sodium chloride flush (NS) 0.9 % injection 10-40 mL  10-40 mL Intracatheter Q12H Eugenie NorrieBlakeney, Dana G, NP   20 mL at 05/16/18 1030  . sodium chloride flush (NS) 0.9 % injection 10-40 mL  10-40 mL Intracatheter PRN Eugenie NorrieBlakeney, Dana G, NP   20 mL at 05/11/18 1019     Objective: Vital signs in last 24 hours: Temp:  [97.5 F (36.4 C)-100.8 F (38.2 C)] 97.5 F (36.4 C) (06/26 0729) Pulse Rate:  [103-124] 103 (06/26 0729) Resp:  [20-22] 21 (06/26 0729) BP: (95-124)/(58-72) 107/58 (06/26 0729) SpO2:  [96 %-100 %] 99 % (06/26 0729)  Intake/Output from previous day: 06/25 0701 - 06/26 0700 In: 455 [I.V.:95; Blood:360] Out: -  Intake/Output this shift: Total I/O In: 610 [P.O.:240; I.V.:20; Blood:350] Out: 25 [Urine:25]   Physical Exam Constitutional: Well nourished. Alert and oriented, No acute distress. HEENT:  AT, moist mucus membranes. Trachea midline, no masses. Cardiovascular: No clubbing, cyanosis, or edema. Respiratory: Normal respiratory effort, no increased work of breathing. Skin: No rashes, bruises or suspicious lesions.  Lower leg edema Lymph: No cervical or inguinal adenopathy. Neurologic: Grossly intact, no focal deficits, moving all 4 extremities. Psychiatric: Normal mood and affect.  Lab Results:  Recent Labs    05/14/18 0554 05/15/18 0500 05/16/18 0739  WBC 12.6* 11.1*  --   HGB 7.7* 7.6* 9.5*  HCT 22.5* 21.9* 27.1*  PLT 305 303  --    BMET Recent Labs    05/15/18 0500 05/16/18 0741  NA 128* 128*  K 3.4* 4.0  CL 93* 93*  CO2 25 24   GLUCOSE 113* 123*  BUN 24* 33*  CREATININE 1.37* 2.30*  CALCIUM 7.8* 7.8*   PT/INR No results for input(s): LABPROT, INR in the last 72 hours. ABG No results for input(s): PHART, HCO3 in the last 72 hours.  Invalid input(s): PCO2, PO2  Studies/Results: [facility]  Result Date: 05/16/2018 CLINICAL DATA:  Acute kidney failure, unspecified. EXAM: RENAL / URINARY TRACT ULTRASOUND COMPLETE COMPARISON:  CTA  of the abdomen and pelvis 05/11/2018. Renal ultrasound 05/06/2018. FINDINGS: Right Kidney: Length: 10.0 cm within limits. Renal parenchyma is within normal limits. No discrete lesions are seen. Moderate hydronephrosis is present. No obstructing mass lesion is present. Left Kidney: Length: 11.7 cm, within normal limits. Renal parenchyma is within normal limits. No discrete lesions are seen. Moderate hydronephrosis is present. No obstructing mass lesion is present. Bladder: Not well visualized. IMPRESSION: 1. Moderate bilateral hydronephrosis remains. 2. No parenchymal lesions. Electronically Signed   By: Marin Roberts M.D.   On: 05/16/2018 12:51  Films reviewed with Dr. Lonna Cobb   Assessment and Plan 1. AKI  Nephrostomy tube removed yesterday  Bilateral hydronephrosis seen on today's RUS but does not appear to be a significant change when compared to previous scan  Left ureteral stent is still in   Hx of IV contrast exposure on 06/21 which may be the contributing factor  Will await nephrology review         LOS: 19 days    [facility] 05/16/2018

## 2018-05-16 NOTE — Progress Notes (Signed)
Patient ID: [name], female   DOB: [birth year], 42 y.o.   MRN: [id number] 18 Days Post-Op  Procedure(s):  1. 04/28/18: ATTEMPTED LAPAROSCOPIC HYSTERECTOMY CONVERTED TO SUPRACERVICAL ABDOMINAL HYSTERECTOMY (N/A) SALPINGO OOPHORECTOMY (Left) CYSTOSCOPY (N/A) UNILATERAL SALPINGECTOMY (Right) LAPAROSCOPIC LYSIS OF ADHESIONS, RETROPERITONEAL DISSECTION  2. 04/29/18: CYSTOSCOPY WITH RETROGRADE PYELOGRAM/Left URETERAL STENT PLACEMENT, ureteroscopy (Bilateral)    Subjective: Feeling improved today, examined in the chair  S/p 2u pRBCs yesterday with adequate improvement in H/H  Working with nutrition to improve protein status      Objective: Vital signs in last 24 hours: Temp:  [97.5 F (36.4 C)-100.8 F (38.2 C)] 97.7 F (36.5 C) (06/26 1348) Pulse Rate:  [103-124] 123 (06/26 1348) Resp:  [20-24] 24 (06/26 1348) BP: (95-124)/(58-77) 117/77 (06/26 1348) SpO2:  [96 %-100 %] 98 % (06/26 1348)  Intake/Output  Intake/Output Summary (Last 24 hours) at 05/16/2018 1409 Last data filed at 05/16/2018 1030 Gross per 24 hour  Intake 1045 ml  Output 25 ml  Net 1020 ml    Physical Exam:  General: Oriented and responsive, appears more cheerful CV: Regular rhythm, no murmurs, regular rate Lungs: CTAB GI: Non-tender, non-distended. Normal bowel sounds. Ecchymosis over mons improving, and lap incisions clean and dry. Pitting edema to 3cm above umbilicus.  Incisions: Clean and dry, steris intact Urine: Clear Extremities: Nontender, no erythema, LE wrapped.   Lab Results: Recent Labs    05/14/18 0554 05/15/18 0500 05/16/18 0739  HGB 7.7* 7.6* 9.5*  HCT 22.5* 21.9* 27.1*  WBC 12.6* 11.1*  --   PLT 305 303  --                  Results for orders placed or performed during the hospital encounter of 04/27/18 (from the past 24 hour(s))  Type and screen     Status: None (Preliminary result)   Collection Time: 05/15/18  6:55 PM  Result Value Ref Range   ABO/RH(D) O POS    Antibody Screen POS    Sample Expiration 05/18/2018    Antibody Identification      ANTI E ANTI KPA Stephania Fragmin) Performed at [facility], [address], [place], [place] [address]    Unit Number [id number]    Blood Component Type RED CELLS,LR    Unit division 00    Status of Unit ISSUED    Transfusion Status OK TO TRANSFUSE    Crossmatch Result COMPATIBLE    Donor AG Type NEGATIVE FOR E ANTIGEN NEGATIVE FOR c ANTIGEN    Unit Number [id number]    Blood Component Type RED CELLS,LR    Unit division 00    Status of Unit ISSUED    Transfusion Status OK TO TRANSFUSE    Crossmatch Result COMPATIBLE    Donor AG Type NEGATIVE FOR E ANTIGEN NEGATIVE FOR c ANTIGEN   Prepare RBC     Status: None   Collection Time: 05/15/18  6:55 PM  Result Value Ref Range   Order Confirmation      ORDER PROCESSED BY BLOOD BANK Performed at [facility], [address] [place], [place] [address]   Hemoglobin and hematocrit, blood     Status: Abnormal   Collection Time: 05/16/18  7:39 AM  Result Value Ref Range   Hemoglobin 9.5 (L) 12.0 - 16.0 g/dL   HCT 24.4 (L) 01.0 - 27.2 %  Renal function panel     Status: Abnormal   Collection Time: 05/16/18  7:41 AM  Result Value Ref Range  Sodium 128 (L) 135 - 145 mmol/L   Potassium 4.0 3.5 - 5.1 mmol/L   Chloride 93 (L) 98 - 111 mmol/L   CO2 24 22 - 32 mmol/L   Glucose, Bld 123 (H) 70 - 99 mg/dL   BUN 33 (H) 6 - 20 mg/dL   Creatinine, Ser 1.612.30 (H) 0.44 - 1.00 mg/dL   Calcium 7.8 (L) 8.9 - 10.3 mg/dL   Phosphorus 3.7 2.5 - 4.6 mg/dL   Albumin 2.3 (L) 3.5 - 5.0 g/dL   GFR calc non Af Amer 25 (L) >60 mL/min   GFR calc Af Amer 29 (L) >60 mL/min   Anion gap 11 5 - 15    Assessment/Plan: 18 Days Post-Op        Procedure(s): 1. 04/28/18: ATTEMPTED LAPAROSCOPIC HYSTERECTOMY CONVERTED TO SUPRACERVICAL ABDOMINAL HYSTERECTOMY (N/A) SALPINGO OOPHORECTOMY (Left) CYSTOSCOPY (N/A) UNILATERAL SALPINGECTOMY (Right) LAPAROSCOPIC LYSIS OF  ADHESIONS, RETROPERITONEAL DISSECTION   2. 04/29/18: CYSTOSCOPY WITH RETROGRADE PYELOGRAM/Left URETERAL STENT PLACEMENT, ureteroscopy (Bilateral)  IMP: Generalized edema Anemia ATN - present  PLAN/REC:  - Needs dispo planning. Case management working with patient and family. Inpatient rehab is likely the best place for her, but she is adamantly declining to trial it and would like to go home. They are working to get her the medical equipment needed. I am concerned that her need level will be too much for her family, and home health will come in and help. That referral has been placed.   - Anemia: despite h/h slowly trending down, still no evidence of pain in abdomen. If her creatinine were stable I would prefer to confirm that with imaging prior to discharge. However, at this time her cr is rising, possibly due to prior iv contrast, and we will defer at this time. I did stop her ppx heparin yesterday after discussion with vascular surgery.  - Vascular surgery - appreciate their recs.   - Urology for management of hematuria, hydro. Renal scan today with bilateral moderate hydronephrosis. F/u their recommendations  - Nephrology for management of electrolyte abnormalities and monitor renal function  - Continuing to need pain meds for management of movement. Discussed need for titration off prior to discharge. Will transition to po pain meds tomorrow, currently q12 hrs   Christeen DouglasBethany Shian Goodnow, MD Cell phone: [phone]   LOS: 19 days   Christeen DouglasBethany Autie Vasudevan 05/16/2018, 2:09 PMPatient ID: Amy SidleGinger B [name], female   DOB: 01/28/1976, 42 y.o.   MRN: [id number]

## 2018-05-16 NOTE — Progress Notes (Signed)
On call nephrology was notified of renal ultrasound results. New verbal orders for IV lasix 40 mg once at this time and verbal order to place a foley if she has not voided by 2030 05/16/2018. If clots are present then CBI catheter needs to be inserted. Pt and pt.'s family updated. Will continue to monitor pt.   Revin Corker [place]

## 2018-05-16 NOTE — Progress Notes (Signed)
Physical Therapy Treatment Patient Details Name: [name] MRN: [id number] DOB: 09-20-1976 Today's Date: 05/16/2018    History of Present Illness Pt is a 42 y/o F s/p laparascopic hysterectomy, Bil aslpingectomy 6/7.  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  Developed hematuria and L hydronephrosis 06/09 requiring cystoscopy and left ureteral stent placement. Renal US 6/10 revealed right hydronephrosis. S/P R percutaneous nephrostomy tube placement 6/10.  NG tube placed 6/10.  Pt with AKI and ileus.  Pt's PMH includes DVT.   Now with extended and complicated hospital stay, followed by OB/GYN, vascular, renal and urology for post-op management.  Cleared by physician (vascular, in particular) for participation with therapy as tolerated without restriction (per Dr. Montel Clock 05/13/18)    PT Comments    Pt seen for PT/OT co-treat.  Pt requiring mod to max assist supine to sit; mod to max assist x2 to stand up to bariatric RW (pt requiring 2 attempts to stand each time).  HR 120-140's bpm during session but briefly up to 148 bpm with activity.  Pt transferred to [facility] and then stood requiring assist for hygiene but needed to sit back down again prior to walking to sink to wash her face.  Pt demonstrates generalized weakness and decreased activity tolerance and requires encouragement for participation.    Follow Up Recommendations  SNF     Equipment Recommendations  Rolling walker with 5" wheels;3in1 (PT);Wheelchair (measurements PT);Wheelchair cushion (measurements PT);Hospital bed;Other (comment)(hoyer lift)    Recommendations for Other Services       Precautions / Restrictions Precautions Precautions: Fall Precaution Comments: L IJ CVC; abdominal drain and incision sites Restrictions Weight Bearing Restrictions: No    Mobility  Bed Mobility Overal bed mobility: Needs Assistance Bed  Mobility: Supine to Sit     Supine to sit: Mod assist;Max assist     General bed mobility comments: assist for B LE's and trunk supine to sit; heavy use of bedrail; increased time to perform  Transfers Overall transfer level: Needs assistance Equipment used: Rolling walker (2 wheeled) Transfers: Sit to/from [employer] Sit to Stand: Mod assist;Max assist;+2 physical assistance Stand pivot transfers: Min assist;+2 physical assistance(stand step turn bed to [facility] with Bariatric RW)       General transfer comment: vc's and assist for B LE placement; cueing for UE placement; assist to initiate and come to full stand  Ambulation/Gait Ambulation/Gait assistance: Min assist;+2 physical assistance Gait Distance (Feet): 10 Feet Assistive device: (Bariatric RW) Gait Pattern/deviations: Step-to pattern;Decreased step length - right;Decreased step length - left Gait velocity: decreased   General Gait Details: forward trunk flexion; limited distance d/t fatigue; difficulty keeping B LE's inside RW causing walker to go too far forward   Stairs             Wheelchair Mobility    Modified Rankin (Stroke Patients Only)       Balance Overall balance assessment: Needs assistance Sitting-balance support: Feet supported;No upper extremity supported Sitting balance-Leahy Scale: Good Sitting balance - Comments: steady sitting reaching within BOS    Standing balance support: Single extremity supported Standing balance-Leahy Scale: Poor Standing balance comment: requires B UE support to maintain static standing balance (leaning forearms on sink counter washing face with wash cloth)                            Cognition Arousal/Alertness: Awake/alert Behavior  During Therapy: Anxious Overall Cognitive Status: Within Functional Limits for tasks assessed                                 General Comments: Fearful during mobility efforts requiring  support and encouragement      Exercises      General Comments   Nursing cleared pt for participation in physical therapy.  Pt agreeable to therapy session with encouragement; pt's friend present during session and pt's husband arrived end of session.      Pertinent Vitals/Pain Pain Assessment: 0-10 Pain Score: 8 (6/10 beginning of session; 8/10 end of session) Pain Location: mid left lower abdomen Pain Descriptors / Indicators: Aching;Grimacing;Constant Pain Intervention(s): Limited activity within patient's tolerance;Monitored during session;Premedicated before session;Repositioned  O2 WFL during session.    Home Living                      Prior Function            PT Goals (current goals can now be found in the care plan section) Acute Rehab PT Goals Patient Stated Goal: to improve mobility PT Goal Formulation: With patient Time For Goal Achievement: 05/30/18 Potential to Achieve Goals: Fair Progress towards PT goals: Progressing toward goals    Frequency    7X/week      PT Plan Current plan remains appropriate    Co-evaluation PT/OT/SLP Co-Evaluation/Treatment: Yes Reason for Co-Treatment: For patient/therapist safety;To address functional/ADL transfers PT goals addressed during session: Mobility/safety with mobility;Balance;Proper use of DME;Strengthening/ROM OT goals addressed during session: ADL's and self-care;Proper use of Adaptive equipment and DME;Strengthening/ROM      AM-PAC PT "6 Clicks" Daily Activity  Outcome Measure  Difficulty turning over in bed (including adjusting bedclothes, sheets and blankets)?: Unable Difficulty moving from lying on back to sitting on the side of the bed? : Unable Difficulty sitting down on and standing up from a chair with arms (e.g., wheelchair, bedside commode, etc,.)?: Unable Help needed moving to and from a bed to chair (including a wheelchair)?: Total Help needed walking in hospital room?: Total Help  needed climbing 3-5 steps with a railing? : Total 6 Click Score: 6    End of Session Equipment Utilized During Treatment: Gait belt(gait belt up high) Activity Tolerance: Patient limited by fatigue;Patient limited by pain Patient left: in chair;with call bell/phone within reach;with chair alarm set;with family/visitor present;Other (comment)(B heels and LE's elevated via blankets) Nurse Communication: Mobility status;Precautions;Other (comment)(Pt's pain status) PT Visit Diagnosis: Unsteadiness on feet (R26.81);Muscle weakness (generalized) (M62.81);Difficulty in walking, not elsewhere classified (R26.2)     Time: [id number] PT Time Calculation (min) (ACUTE ONLY): 36 min  Charges:  $Therapeutic Activity: 8-22 mins                    G CodesHendricks Limes:       Rose Hegner, PT 05/16/18, 2:50 PM [phone]

## 2018-05-16 NOTE — Consult Note (Signed)
[facility] Vascular Consult Note  MRN : [id number]  [name] is a 42 y.o. (25-Apr-1976) female who presents with chief complaint of No chief complaint on file. [name]  History of Present Illness:  I am asked to see the patient by Dr Dalbert Garnet  The patient is a 42 year old woman that underwent hysterectomy on April 27, 2018.  At the time of surgery extensive venous anatomy was identified.  This subsequently resulted in conversion to open laparotomy with completion of the hysterectomy.  Left ovary was resected at that time.  Postoperatively the patient developed significant hematuria and urological difficulties which are being addressed.  She is also developed significant anemia and has been transfused 1 unit of blood for a declining hemoglobin.  I am asked to evaluate the patient because she has recently developed massive swelling of both lower extremities.  The edema has now progressed to the umbilical level.  Patient is experiencing significant pain associated with the swelling.  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  This occurred approximately 1 month after delivery.  She describes being treated at [facility] where initial attempts at removing the clot were not successful and she was subsequently taken to "surgery" where the clot was removed and she was told "everything is fixed".  She does not recall being on anticoagulation for an extended period after that however she does note that her subsequent 2 pregnancies she was required to take heparin shots continuously for the full duration of her pregnancies.  She has not been maintained on anticoagulation recently she has not had any further thrombotic episodes since that experience.  Current Facility-Administered Medications  Medication Dose Route Frequency Provider Last Rate Last Dose  . 0.9 %  sodium chloride infusion (Manually program via Guardrails IV Fluids)    Intravenous Once Christeen Douglas, MD      . ALPRAZolam Prudy Feeler) tablet 1 mg  1 mg Oral TID PRN Christeen Douglas, MD   1 mg at 05/16/18 1131  . calcium carbonate (TUMS - dosed in mg elemental calcium) chewable tablet 200 mg of elemental calcium  1 tablet Oral 5 X Daily PRN Christeen Douglas, MD   200 mg of elemental calcium at 05/12/18 1235  . docusate sodium (COLACE) capsule 100 mg  100 mg Oral BID Christeen Douglas, MD   100 mg at 05/16/18 1027  . feeding supplement (ENSURE ENLIVE) (ENSURE ENLIVE) liquid 237 mL  237 mL Oral TID BM Merwyn Katos, MD   237 mL at 05/16/18 2008  . fluticasone (FLONASE) 50 MCG/ACT nasal spray 1 spray  1 spray Each Nare PRN Christeen Douglas, MD      . HYDROmorphone (DILAUDID) injection 0.5 mg  0.5 mg Intravenous Q8H PRN Christeen Douglas, MD   0.5 mg at 05/16/18 1638  . lidocaine (XYLOCAINE) 5 % ointment   Topical BID PRN Merwyn Katos, MD      . Melatonin TABS 5 mg  5 mg Oral QHS PRN Tukov-Yual, Magdalene S, NP   5 mg at 05/08/18 0019  . menthol-cetylpyridinium (CEPACOL) lozenge 3 mg  1 lozenge Oral Q2H PRN Ray Church III, MD      . mirabegron ER [facility]) tablet 50 mg  50 mg Oral Daily Vanna Scotland, MD   50 mg at 05/15/18 1035  . mirtazapine (REMERON) tablet 15 mg  15 mg Oral QHS Clapacs, Jackquline Denmark, MD   15 mg at 05/15/18 2042  .  multivitamin with minerals tablet 1 tablet  1 tablet Oral Daily Christeen DouglasBeasley, Bethany, MD   1 tablet at 05/16/18 1027  . nystatin (MYCOSTATIN) 100000 UNIT/ML suspension 500,000 Units  5 mL Oral QID Conforti, John, DO   500,000 Units at 05/16/18 1517  . ondansetron (ZOFRAN) injection 4 mg  4 mg Intravenous Q6H PRN Ray ChurchBell, Eugene D III, MD   4 mg at 05/16/18 0141  . opium-belladonna (B&O SUPPRETTES) 16.2-60 MG suppository 1 suppository  1 suppository Rectal Q8H PRN Rene PaciWinter, Christopher Aaron, MD   1 suppository at 05/15/18 1135  . oxyCODONE-acetaminophen (PERCOCET/ROXICET) 5-325 MG per tablet 1-2 tablet  1-2 tablet Oral Q6H PRN Christeen DouglasBeasley,  Bethany, MD   2 tablet at 05/16/18 1901  . pantoprazole (PROTONIX) EC tablet 40 mg  40 mg Oral Daily Christeen DouglasBeasley, Bethany, MD   40 mg at 05/16/18 1027  . phenazopyridine (PYRIDIUM) tablet 200 mg  200 mg Oral Q8H PRN Rene PaciWinter, Christopher Aaron, MD   200 mg at 05/11/18 0004  . phenol (CHLORASEPTIC) mouth spray 1 spray  1 spray Mouth/Throat PRN Merwyn KatosSimonds, David B, MD      . polyethylene glycol (MIRALAX / GLYCOLAX) packet 17 g  17 g Oral Daily Christeen DouglasBeasley, Bethany, MD   17 g at 05/16/18 1027  . promethazine (PHENERGAN) injection 12.5-25 mg  12.5-25 mg Intravenous Q6H PRN Tukov-Yual, Magdalene S, NP   25 mg at 05/03/18 0000  . sodium chloride flush (NS) 0.9 % injection 10-40 mL  10-40 mL Intracatheter Q12H Eugenie NorrieBlakeney, Dana G, NP   20 mL at 05/16/18 1030  . sodium chloride flush (NS) 0.9 % injection 10-40 mL  10-40 mL Intracatheter PRN Eugenie NorrieBlakeney, Dana G, NP   20 mL at 05/11/18 1019    Past Medical History:  Diagnosis Date  . DVT (deep venous thrombosis) (HCC) 1992   3 blood clots after pregnancy (one in each leg and one in abdomen)  . GERD (gastroesophageal reflux disease)     Past Surgical History:  Procedure Laterality Date  . CESAREAN SECTION  2000  . CYSTOSCOPY N/A 04/27/2018   Procedure: CYSTOSCOPY;  Surgeon: Christeen DouglasBeasley, Bethany, MD;  Location: [facility] ORS;  Service: Gynecology;  Laterality: N/A;  . CYSTOSCOPY W/ URETERAL STENT PLACEMENT Bilateral 04/28/2018   Procedure: CYSTOSCOPY WITH RETROGRADE PYELOGRAM/Left URETERAL STENT PLACEMENT, ureteroscopy;  Surgeon: Crista ElliotBell, Eugene D III, MD;  Location: [facility] ORS;  Service: Urology;  Laterality: Bilateral;  . IR NEPHROSTOGRAM RIGHT THRU EXISTING ACCESS  05/10/2018  . IR NEPHROSTOMY PLACEMENT RIGHT  04/30/2018  . LAPAROSCOPIC LYSIS OF ADHESIONS  04/27/2018   Procedure: LAPAROSCOPIC LYSIS OF ADHESIONS, RETROPERITONEAL DISSECTION;  Surgeon: Christeen DouglasBeasley, Bethany, MD;  Location: [facility] OR[facility];  Service: Gynecology;;  . SALPINGOOPHORECTOMY Left 04/27/2018   Procedure: SALPINGO OOPHORECTOMY;   Surgeon: Christeen DouglasBeasley, Bethany, MD;  Location: [facility] OR[facility];  Service: Gynecology;  Laterality: Left;  . SUPRACERVICAL ABDOMINAL HYSTERECTOMY N/A 04/27/2018   Procedure: ATTEMPTED LAPAROSCOPIC HYSTERECTOMY CONVERTED TO SUPRACERVICAL ABDOMINAL HYSTERECTOMY;  Surgeon: Christeen DouglasBeasley, Bethany, MD;  Location: [facility] OR[facility];  Service: Gynecology;  Laterality: N/A;  . UNILATERAL SALPINGECTOMY Right 04/27/2018   Procedure: UNILATERAL SALPINGECTOMY;  Surgeon: Christeen DouglasBeasley, Bethany, MD;  Location: [facility] OR[facility];  Service: Gynecology;  Laterality: Right;  . VENOUS THROMBECTOMY  1992    Social History Social History   Tobacco Use  . Smoking status: Never Smoker  . Smokeless tobacco: Never Used  Substance Use Topics  . Alcohol use: Yes    Comment: occassional  . Drug use: No    Family History Family History  Problem  Relation Age of Onset  . Diabetes Mother   . Hypertension Mother   . Addison's disease Mother   No family history of bleeding/clotting disorders, porphyria or autoimmune disease   No Known Allergies   REVIEW OF SYSTEMS (Negative unless checked)  Constitutional: [] Weight loss  [] Fever  [] Chills Cardiac: [] Chest pain   [] Chest pressure   [] Palpitations   [] Shortness of breath when laying flat   [] Shortness of breath at rest   [] Shortness of breath with exertion. Vascular:  [x] Pain in legs with walking   [x] Pain in legs at rest   [] Pain in legs when laying flat   [] Claudication   [] Pain in feet when walking  [] Pain in feet at rest  [] Pain in feet when laying flat   [] History of DVT   [] Phlebitis   [x] Swelling in legs   [] Varicose veins   [] Non-healing ulcers Pulmonary:   [] Uses home oxygen   [] Productive cough   [] Hemoptysis   [] Wheeze  [] COPD   [] Asthma Neurologic:  [] Dizziness  [] Blackouts   [] Seizures   [] History of stroke   [] History of TIA  [] Aphasia   [] Temporary blindness   [] Dysphagia   [] Weakness or numbness in arms   [] Weakness or numbness in legs Musculoskeletal:  [] Arthritis   [] Joint swelling   [] Joint  pain   [x] Low back pain Hematologic:  [] Easy bruising  [] Easy bleeding   [] Hypercoagulable state   [] Anemic  [] Hepatitis Gastrointestinal:  [] Blood in stool   [] Vomiting blood  [] Gastroesophageal reflux/heartburn   [] Difficulty swallowing. Genitourinary:  [] Chronic kidney disease   [] Difficult urination  [] Frequent urination  [] Burning with urination   [x] Blood in urine Skin:  [] Rashes   [] Ulcers   [] Wounds Psychological:  [] History of anxiety   []  History of major depression.    Physical Examination  Vitals:   05/16/18 0729 05/16/18 1348 05/16/18 1400 05/16/18 2011  BP: (!) 107/58 117/77  (!) 131/57  Pulse: (!) 103 (!) 123 (!) 115 (!) 122  Resp: (!) 21 (!) 24  20  Temp: (!) 97.5 F (36.4 C) 97.7 F (36.5 C)  98.8 F (37.1 C)  TempSrc: Oral Oral  Oral  SpO2: 99% 98%  100%  Weight:      Height:       Body mass index is 34.11 kg/m.  Head: [place]/AT, No temporalis wasting. Prominent temp pulse not noted. Ear/Nose/Throat: Nares w/o erythema or drainage, oropharynx w/o obsrtuction, Mallampati score: 3.  Dentition good.  Eyes: PERRLA, Sclera nonicteric.  Neck: Supple, no nuchal rigidity.  No bruit or JVD.  Pulmonary:  Breath sounds equal bilaterally, no use of accessory muscles.  Cardiac: RRR, normal S1, S2, no Murmurs, rubs or gallops. Vascular: There is 4+ massive edema bilateral lower extremities.  She is warm to the ankles but the feet are cool to the touch.  There is mild cyanosis of the toes bilaterally.  Diffuse varicosities are noted.  Pulses are trace DP and posterior tibial bilaterally Gastrointestinal: soft, non-tender, non-distended.  Musculoskeletal: Moves all extremities.  No deformity or atrophy. No edema. Neurologic: CN 2-12 intact. Symmetrical.  Speech is fluent.  Psychiatric: Judgment intact, Mood & affect appropriate for pt's clinical situation. Dermatologic: No ulcers noted.  No cellulitis or open wounds. Lymph : No Cervical,  or Inguinal lymphadenopathy.       CBC Lab Results  Component Value Date   WBC 11.1 (H) 05/15/2018   HGB 9.5 (L) 05/16/2018   HCT 27.1 (L) 05/16/2018   MCV 91.0 05/15/2018   PLT  303 05/15/2018    BMET    Component Value Date/Time   NA 128 (L) 05/16/2018 0741   K 4.0 05/16/2018 0741   CL 93 (L) 05/16/2018 0741   CO2 24 05/16/2018 0741   GLUCOSE 123 (H) 05/16/2018 0741   BUN 33 (H) 05/16/2018 0741   CREATININE 2.30 (H) 05/16/2018 0741   CALCIUM 7.8 (L) 05/16/2018 0741   GFRNONAA 25 (L) 05/16/2018 0741   GFRAA 29 (L) 05/16/2018 0741   Estimated Creatinine Clearance: 35.9 mL/min (A) (by C-G formula based on SCr of 2.3 mg/dL (H)).  COAG No results found for: INR, PROTIME   Assessment/Plan 1.  Massive bilateral lower extremity edema: The patient is likely to have recurrence of her venous thrombosis.  Based on the extensiveness in the bilateral lower extremity symptoms it is likely the thrombotic episode includes her vena cava.  Given her hematuria and anemia requiring transfusions he is not a candidate for anticoagulation or intervention at this time.  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  There may be some acute clot in the left pelvic area.  Given her hematuria full anticoagulation with heparin cannot be undertaken at this time.  However if her urine clears and her hemoglobin remained stable I would advocate for anticoagulation and certainly if feasible she should be discharged home on anticoagulation.  I have elevated the foot of her bed as high as possible and we will gently wrap her legs and Ace wraps.  Given her low serum protein we will asked nutrition to assess to see if we can improve her protein stores as this is certainly not helping with her edema.     Levora Dredge, MD  05/16/2018 8:47 PM

## 2018-05-16 NOTE — Progress Notes (Signed)
[facility]  ROUNDING NOTE   Subjective:   Husband at bedside.   Creatinine 2.3 (1.37) (0.95)  Patient states she is not urinating much today and is concerned.   Status post 2 units PRBC  Objective:  Vital signs in last 24 hours:  Temp:  [97.5 F (36.4 C)-100.8 F (38.2 C)] 97.5 F (36.4 C) (06/26 0729) Pulse Rate:  [103-124] 103 (06/26 0729) Resp:  [20-22] 21 (06/26 0729) BP: (95-124)/(58-72) 107/58 (06/26 0729) SpO2:  [96 %-100 %] 99 % (06/26 0729)  Weight change:  Filed Weights   05/03/18 0800  Weight: 93 kg (205 lb)    Intake/Output: I/O last 3 completed shifts: In: 791 [P.O.:236; I.V.:95; Blood:360; IV Piggyback:100] Out: -    Intake/Output this shift:  Total I/O In: 590 [P.O.:240; Blood:350] Out: 25 [Urine:25]  Physical Exam: General: NAD,   Head: Normocephalic, atraumatic. Moist oral mucosal membranes  Eyes: Anicteric, PERRL  Neck: Supple, trachea midline  Lungs:  Clear to auscultation  Heart: Regular rate and rhythm  Abdomen:  Soft, nontender  Extremities: 1+ peripheral edema.  Neurologic: Nonfocal, moving all four extremities  Skin: No lesions        Basic Metabolic Panel: Recent Labs  Lab 05/12/18 0517 05/13/18 0513 05/14/18 0554 05/15/18 0500 05/15/18 0611 05/16/18 0741  NA 128* 130* 128* 128*  --  128*  K 3.3* 3.7 3.3* 3.4*  --  4.0  CL 93* 95* 93* 93*  --  93*  CO2 24 25 25 25   --  24  GLUCOSE 142* 146* 122* 113*  --  123*  BUN 31* 21* 22* 24*  --  33*  CREATININE 1.09* 0.95 0.95 1.37*  --  2.30*  CALCIUM 8.0* 8.0* 7.9* 7.8*  --  7.8*  MG  --   --   --   --  1.6*  --   PHOS  --   --   --   --  3.0 3.7    Liver Function Tests: Recent Labs  Lab 05/11/18 1548 05/12/18 0517 05/13/18 0513 05/14/18 0554 05/15/18 0500 05/16/18 0741  AST 86* 63* 57* 52* 53*  --   ALT 56* 42 25 20 14   --   ALKPHOS 233* 215* 236* 215* 254*  --   BILITOT 0.9 0.9 0.7 0.7 0.7  --   PROT 6.3* 6.2* 6.0* 6.2* 6.5  --   ALBUMIN 3.0*  2.9* 2.5* 2.3* 2.6* 2.3*   No results for input(s): LIPASE, AMYLASE in the last 168 hours. No results for input(s): AMMONIA in the last 168 hours.  CBC: Recent Labs  Lab 05/10/18 0452  05/12/18 0517 05/13/18 0513 05/14/18 0554 05/15/18 0500 05/16/18 0739  WBC 20.4*  --  18.1* 14.9* 12.6* 11.1*  --   HGB 7.0*   < > 9.0* 8.3* 7.7* 7.6* 9.5*  HCT 20.5*   < > 26.1* 24.5* 22.5* 21.9* 27.1*  MCV 89.8  --  89.6 91.4 91.0 91.0  --   PLT 370  --  319 294 305 303  --    < > = values in this interval not displayed.    Cardiac Enzymes: No results for input(s): CKTOTAL, CKMB, CKMBINDEX, TROPONINI in the last 168 hours.  BNP: Invalid input(s): POCBNP  CBG: Recent Labs  Lab 05/13/18 1110  GLUCAP 145*    Microbiology: Results for orders placed or performed during the hospital encounter of 04/27/18  MRSA PCR Screening     Status: None   Collection Time: 04/27/18 10:27  PM  Result Value Ref Range Status   MRSA by PCR NEGATIVE NEGATIVE Final    Comment:        The GeneXpert MRSA Assay (FDA approved for NASAL specimens only), is one component of a comprehensive MRSA colonization surveillance program. It is not intended to diagnose MRSA infection nor to guide or monitor treatment for MRSA infections. Performed at [facility], [address] [place], [place] [address]   CULTURE, BLOOD (ROUTINE X 2) w Reflex to ID Panel     Status: None   Collection Time: 04/28/18  9:24 PM  Result Value Ref Range Status   Specimen Description BLOOD RIGHT HAND  Final   Special Requests   Final    BOTTLES DRAWN AEROBIC AND ANAEROBIC Blood Culture adequate volume   Culture   Final    NO GROWTH 5 DAYS Performed at [facility], [address] [place], [place] [address]    Report Status 05/03/2018 FINAL  Final  CULTURE, BLOOD (ROUTINE X 2) w Reflex to ID Panel     Status: None   Collection Time: 04/28/18  9:44 PM  Result Value Ref Range Status   Specimen Description BLOOD  RIGHT ANTECUBITAL  Final   Special Requests   Final    BOTTLES DRAWN AEROBIC AND ANAEROBIC Blood Culture adequate volume   Culture   Final    NO GROWTH 5 DAYS Performed at [facility], [address] [place], [place] [address]    Report Status 05/03/2018 FINAL  Final  Urine Culture     Status: None   Collection Time: 05/09/18  4:30 PM  Result Value Ref Range Status   Specimen Description   Final    URINE, CATHETERIZED Performed at [facility], [address] [place], [place] [address]    Special Requests   Final    Normal Performed at [facility], [address] [place], [place] [address]    Culture   Final    NO GROWTH Performed at [facility], [address] [place], [place] [address]    Report Status 05/10/2018 FINAL  Final  CULTURE, BLOOD (ROUTINE X 2) w Reflex to ID Panel     Status: Abnormal   Collection Time: 05/09/18  6:02 PM  Result Value Ref Range Status   Specimen Description   Final    BLOOD LEFT HAND Performed at [facility], [address] [place], [place] [address]    Special Requests   Final    BOTTLES DRAWN AEROBIC AND ANAEROBIC Blood Culture adequate volume Performed at [facility], [address] [place], [place] [address]    Culture  Setup Time   Final    AEROBIC BOTTLE ONLY GRAM POSITIVE COCCI CRITICAL RESULT CALLED TO, READ BACK BY AND VERIFIED WITH: LISA KLUTTZ 05/10/18 1433 KLW    Culture (A)  Final    STAPHYLOCOCCUS SPECIES (COAGULASE NEGATIVE) THE SIGNIFICANCE OF ISOLATING THIS ORGANISM FROM A SINGLE SET OF BLOOD CULTURES WHEN MULTIPLE SETS ARE DRAWN IS UNCERTAIN. PLEASE NOTIFY THE MICROBIOLOGY DEPARTMENT WITHIN ONE WEEK IF SPECIATION AND SENSITIVITIES ARE REQUIRED. Performed at [facility], [address] [place], [place] [address]    Report Status 05/12/2018 FINAL  Final  Blood Culture ID Panel (Reflexed)     Status: Abnormal   Collection Time: 05/09/18  6:02 PM   Result Value Ref Range Status   Enterococcus species NOT DETECTED NOT DETECTED Final   Listeria monocytogenes  NOT DETECTED NOT DETECTED Final   Staphylococcus species DETECTED (A) NOT DETECTED Final    Comment: Methicillin (oxacillin) susceptible coagulase negative staphylococcus. Possible blood culture contaminant (unless isolated from more than one blood culture draw or clinical case suggests pathogenicity). No antibiotic treatment is indicated for blood  culture contaminants. CRITICAL RESULT CALLED TO, READ BACK BY AND VERIFIED WITH: LISA KLUTTZ 05/10/18 1433 KLW    Staphylococcus aureus NOT DETECTED NOT DETECTED Final   Methicillin resistance NOT DETECTED NOT DETECTED Final   Streptococcus species NOT DETECTED NOT DETECTED Final   Streptococcus agalactiae NOT DETECTED NOT DETECTED Final   Streptococcus pneumoniae NOT DETECTED NOT DETECTED Final   Streptococcus pyogenes NOT DETECTED NOT DETECTED Final   Acinetobacter baumannii NOT DETECTED NOT DETECTED Final   Enterobacteriaceae species NOT DETECTED NOT DETECTED Final   Enterobacter cloacae complex NOT DETECTED NOT DETECTED Final   Escherichia coli NOT DETECTED NOT DETECTED Final   Klebsiella oxytoca NOT DETECTED NOT DETECTED Final   Klebsiella pneumoniae NOT DETECTED NOT DETECTED Final   Proteus species NOT DETECTED NOT DETECTED Final   Serratia marcescens NOT DETECTED NOT DETECTED Final   Haemophilus influenzae NOT DETECTED NOT DETECTED Final   Neisseria meningitidis NOT DETECTED NOT DETECTED Final   Pseudomonas aeruginosa NOT DETECTED NOT DETECTED Final   Candida albicans NOT DETECTED NOT DETECTED Final   Candida glabrata NOT DETECTED NOT DETECTED Final   Candida krusei NOT DETECTED NOT DETECTED Final   Candida parapsilosis NOT DETECTED NOT DETECTED Final   Candida tropicalis NOT DETECTED NOT DETECTED Final    Comment: Performed at [facility], [address] [place], [place] [address]  CULTURE, BLOOD (ROUTINE X 2)  w Reflex to ID Panel     Status: None   Collection Time: 05/09/18  7:00 PM  Result Value Ref Range Status   Specimen Description BLOOD RIGHT HAND  Final   Special Requests   Final    BOTTLES DRAWN AEROBIC AND ANAEROBIC Blood Culture adequate volume   Culture   Final    NO GROWTH 5 DAYS Performed at [facility], [address] [place], [place] [address]    Report Status 05/14/2018 FINAL  Final  CULTURE, BLOOD (ROUTINE X 2) w Reflex to ID Panel     Status: None   Collection Time: 05/10/18  7:10 PM  Result Value Ref Range Status   Specimen Description BLOOD LINE DRAW PER MD  Final   Special Requests   Final    BOTTLES DRAWN AEROBIC AND ANAEROBIC Blood Culture results may not be optimal due to an excessive volume of blood received in culture bottles   Culture   Final    NO GROWTH 5 DAYS Performed at [facility], [address] [place], [place] [address]    Report Status 05/15/2018 FINAL  Final    Coagulation Studies: No results for input(s): LABPROT, INR in the last 72 hours.  Urinalysis: No results for input(s): COLORURINE, LABSPEC, PHURINE, GLUCOSEU, HGBUR, BILIRUBINUR, KETONESUR, PROTEINUR, UROBILINOGEN, NITRITE, LEUKOCYTESUR in the last 72 hours.  Invalid input(s): APPERANCEUR    Imaging: No results found.   Medications:    . sodium chloride   Intravenous Once  . docusate sodium  100 mg Oral BID  . feeding supplement (ENSURE ENLIVE)  237 mL Oral TID BM  . mirabegron ER  50 mg Oral Daily  . mirtazapine  15 mg Oral QHS  . multivitamin with minerals  1 tablet Oral Daily  . nystatin  5 mL Oral QID  . pantoprazole  40 mg Oral Daily  . polyethylene glycol  17 g Oral Daily  . sodium chloride flush  10-40 mL Intracatheter Q12H   ALPRAZolam, calcium carbonate, HYDROmorphone (DILAUDID) injection, lidocaine, Melatonin, menthol-cetylpyridinium, [DISCONTINUED] ondansetron **OR** ondansetron (ZOFRAN) IV, opium-belladonna, oxyCODONE-acetaminophen,  phenazopyridine, phenol, promethazine, sodium chloride flush  Assessment/ Plan:  Ms. [name] is a 42 y.o. whtie female  post op abdominal hysterectomy, left salpingo-oophorectomy, laparoscopic lysis of adhesions and retroperitoneal dissection on 04/28/2018. Followed by cystoscopy with retrograde pyelogram and left ureteral stent placement on 04/29/2018 Baseline creatinine is 0.79 from 04/19/2018  1. Acute renal failure: secondary to ATN and obstructive uropathy. However this has improved. Creatinine has now rising. Most likely due to IV contrast exposure on 6/21.  - Recheck renal ultrasound - If no obstruction, will give IV furosemide  2. Generalized edema/Anasarca: with albumin of 2.6 - Encourage PO intake.  - Low threshold for diuretics as above.   3. Hypokalemia: potassium now at goal  4.  Hyponatremia.  Na 128 - most likely secondary to anasarca/volume status - encourage PO intake.   5.  Anemia from blood loss: status post PRBC transfusion 6/25    LOS: 19 Rindy Kollman 6/26/201910:26 AM

## 2018-05-16 NOTE — Progress Notes (Signed)
[facility]  Daily Progress Note   Subjective  - 18 Days Post-Op  This has been a good day, the patient's pain is decreased she has tolerated placing TED hose and Ace wraps and continues to elevate her legs and in fact is added pillows underneath her legs for added height.  She did ambulate approximately 10 steps.  Objective Vitals:   05/16/18 0729 05/16/18 1348 05/16/18 1400 05/16/18 2011  BP: (!) 107/58 117/77  (!) 131/57  Pulse: (!) 103 (!) 123 (!) 115 (!) 122  Resp: (!) 21 (!) 24  20  Temp: (!) 97.5 F (36.4 C) 97.7 F (36.5 C)  98.8 F (37.1 C)  TempSrc: Oral Oral  Oral  SpO2: 99% 98%  100%  Weight:      Height:        Intake/Output Summary (Last 24 hours) at 05/16/2018 2115 Last data filed at 05/16/2018 1030 Gross per 24 hour  Intake 1045 ml  Output 25 ml  Net 1020 ml    PULM  Normal effort , no use of accessory muscles CV  No JVD, RRR Abd      No distended, nontender VASC  bilateral edema 3-4+ but her toes are now pink and her foot is warm  Laboratory CBC    Component Value Date/Time   WBC 11.1 (H) 05/15/2018 0500   HGB 9.5 (L) 05/16/2018 0739   HCT 27.1 (L) 05/16/2018 0739   PLT 303 05/15/2018 0500    BMET    Component Value Date/Time   NA 128 (L) 05/16/2018 0741   K 4.0 05/16/2018 0741   CL 93 (L) 05/16/2018 0741   CO2 24 05/16/2018 0741   GLUCOSE 123 (H) 05/16/2018 0741   BUN 33 (H) 05/16/2018 0741   CREATININE 2.30 (H) 05/16/2018 0741   CALCIUM 7.8 (L) 05/16/2018 0741   GFRNONAA 25 (L) 05/16/2018 0741   GFRAA 29 (L) 05/16/2018 0741    Assessment/Planning:   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  Her hemoglobin incremented quite nicely after transfusion yesterday and should it remain stable anticoagulation may be possible.  However, at this time she is having recurrent  urological and nephrology issues.  And therefore I would not initiate anticoagulation at this time.  Hopefully tomorrow her creatinine will be down and her hydronephrosis and urinary issues improved.    With respect to her being discharged home I do not feel that this is the optimal situation.  However the patient and family are adamantly refusing SNF.  We are moving forward with durable medical goods for the home in an attempt to make transition from the hospital to home successful.    We will continue to follow.    Levora DredgeGregory Juanmanuel Marohl  05/16/2018, 9:15 PM

## 2018-05-16 NOTE — Progress Notes (Signed)
Pt has voided multiple times after IV lasix was administrated. Pt is incontinent at this time and does not want to try the bedpan or BSC. Urine has been pink with no blood clots. RN [facility]   [name] [place]

## 2018-05-17 DIAGNOSIS — I82220 Acute embolism and thrombosis of inferior vena cava: Secondary | ICD-10-CM

## 2018-05-17 DIAGNOSIS — R600 Localized edema: Secondary | ICD-10-CM

## 2018-05-17 LAB — COMPREHENSIVE METABOLIC PANEL
ALK PHOS: 253 U/L — AB (ref 38–126)
ALT: 5 U/L (ref 0–44)
AST: 45 U/L — AB (ref 15–41)
Albumin: 2.2 g/dL — ABNORMAL LOW (ref 3.5–5.0)
Anion gap: 12 (ref 5–15)
BUN: 36 mg/dL — AB (ref 6–20)
CALCIUM: 7.7 mg/dL — AB (ref 8.9–10.3)
CHLORIDE: 91 mmol/L — AB (ref 98–111)
CO2: 23 mmol/L (ref 22–32)
CREATININE: 2.97 mg/dL — AB (ref 0.44–1.00)
GFR calc non Af Amer: 18 mL/min — ABNORMAL LOW (ref >60)
GFR, EST AFRICAN AMERICAN: 21 mL/min — AB (ref >60)
Glucose, Bld: 120 mg/dL — ABNORMAL HIGH (ref 70–99)
Potassium: 3.9 mmol/L (ref 3.5–5.1)
Sodium: 126 mmol/L — ABNORMAL LOW (ref 135–145)
Total Bilirubin: 0.8 mg/dL (ref 0.3–1.2)
Total Protein: 6.3 g/dL — ABNORMAL LOW (ref 6.5–8.1)

## 2018-05-17 LAB — RENAL FUNCTION PANEL
ANION GAP: 12 (ref 5–15)
Albumin: 2.3 g/dL — ABNORMAL LOW (ref 3.5–5.0)
BUN: 37 mg/dL — ABNORMAL HIGH (ref 6–20)
CALCIUM: 7.8 mg/dL — AB (ref 8.9–10.3)
CO2: 23 mmol/L (ref 22–32)
Chloride: 93 mmol/L — ABNORMAL LOW (ref 98–111)
Creatinine, Ser: 2.96 mg/dL — ABNORMAL HIGH (ref 0.44–1.00)
GFR, EST AFRICAN AMERICAN: 21 mL/min — AB (ref >60)
GFR, EST NON AFRICAN AMERICAN: 18 mL/min — AB (ref >60)
Glucose, Bld: 123 mg/dL — ABNORMAL HIGH (ref 70–99)
Phosphorus: 3.9 mg/dL (ref 2.5–4.6)
Potassium: 4 mmol/L (ref 3.5–5.1)
Sodium: 128 mmol/L — ABNORMAL LOW (ref 135–145)

## 2018-05-17 LAB — CBC
HCT: 27.2 % — ABNORMAL LOW (ref 35.0–47.0)
HEMOGLOBIN: 9.4 g/dL — AB (ref 12.0–16.0)
MCH: 31.4 pg (ref 26.0–34.0)
MCHC: 34.4 g/dL (ref 32.0–36.0)
MCV: 91.4 fL (ref 80.0–100.0)
PLATELETS: 274 10*3/uL (ref 150–440)
RBC: 2.98 MIL/uL — AB (ref 3.80–5.20)
RDW: 16.5 % — ABNORMAL HIGH (ref 11.5–14.5)
WBC: 11.5 10*3/uL — AB (ref 3.6–11.0)

## 2018-05-17 MED ORDER — MIRABEGRON ER 25 MG PO TB24
25.0000 mg | ORAL_TABLET | Freq: Every day | ORAL | Status: DC
  Administered 2018-05-17 – 2018-05-19 (×2): 25 mg via ORAL
  Filled 2018-05-17 (×3): qty 1

## 2018-05-17 MED ORDER — CEFAZOLIN SODIUM-DEXTROSE 2-4 GM/100ML-% IV SOLN
2.0000 g | INTRAVENOUS | Status: AC
  Administered 2018-05-18: 2 g via INTRAVENOUS
  Filled 2018-05-17: qty 100

## 2018-05-17 MED ORDER — FUROSEMIDE 20 MG PO TABS
20.0000 mg | ORAL_TABLET | Freq: Every day | ORAL | Status: DC
  Administered 2018-05-17 – 2018-05-19 (×2): 20 mg via ORAL
  Filled 2018-05-17 (×2): qty 1

## 2018-05-17 MED ORDER — CEFAZOLIN (ANCEF) 1 G IV SOLR: 2.0000 g | INTRAVENOUS | Status: DC

## 2018-05-17 NOTE — Consult Note (Signed)
Urology Consult Follow Up  Subjective: No further flank pain.  Urine output improved after Lasix administration.  No significant gross hematuria.  Anti-infectives: Anti-infectives (From admission, onward)   Start     Dose/Rate Route Frequency Ordered Stop   05/10/18 1500  ceFAZolin (ANCEF) IVPB 2g/100 mL premix  Status:  Discontinued     2 g 200 mL/hr over 30 Minutes Intravenous Every 8 hours 05/10/18 1453 05/15/18 1227   05/06/18 2200  meropenem (MERREM) 1 g in sodium chloride 0.9 % 100 mL IVPB  Status:  Discontinued     1 g 200 mL/hr over 30 Minutes Intravenous Every 12 hours 05/06/18 1305 05/07/18 0845   05/02/18 1000  meropenem (MERREM) 500 mg in sodium chloride 0.9 % 100 mL IVPB  Status:  Discontinued     500 mg 200 mL/hr over 30 Minutes Intravenous Every 12 hours 05/02/18 0911 05/06/18 1305   05/01/18 2200  piperacillin-tazobactam (ZOSYN) IVPB 3.375 g  Status:  Discontinued     3.375 g 12.5 mL/hr over 240 Minutes Intravenous Every 12 hours 05/01/18 1952 05/02/18 0838   04/30/18 2200  piperacillin-tazobactam (ZOSYN) IVPB 3.375 g  Status:  Discontinued     3.375 g 12.5 mL/hr over 240 Minutes Intravenous Every 12 hours 04/30/18 0833 05/01/18 1509   04/29/18 0330  piperacillin-tazobactam (ZOSYN) IVPB 3.375 g  Status:  Discontinued     3.375 g 12.5 mL/hr over 240 Minutes Intravenous Every 8 hours 04/29/18 0317 04/30/18 0833   04/27/18 0615  ceFAZolin (ANCEF) 2-4 GM/100ML-% IVPB    Note to Pharmacy:  Lorrene ReidJackson, Pamela   : cabinet override      04/27/18 0615 04/27/18 0746   04/27/18 0600  ceFAZolin (ANCEF) IVPB 2g/100 mL premix     2 g 200 mL/hr over 30 Minutes Intravenous On call to O.R. 04/26/18 2240 04/27/18 1142      Current Facility-Administered Medications  Medication Dose Route Frequency Provider Last Rate Last Dose  . 0.9 %  sodium chloride infusion (Manually program via Guardrails IV Fluids)   Intravenous Once Christeen DouglasBeasley, Bethany, MD      . ALPRAZolam Prudy Feeler(XANAX) tablet 1 mg  1  mg Oral TID PRN Christeen DouglasBeasley, Bethany, MD   1 mg at 05/17/18 0334  . calcium carbonate (TUMS - dosed in mg elemental calcium) chewable tablet 200 mg of elemental calcium  1 tablet Oral 5 X Daily PRN Christeen DouglasBeasley, Bethany, MD   200 mg of elemental calcium at 05/12/18 1235  . docusate sodium (COLACE) capsule 100 mg  100 mg Oral BID Christeen DouglasBeasley, Bethany, MD   100 mg at 05/16/18 2214  . feeding supplement (ENSURE ENLIVE) (ENSURE ENLIVE) liquid 237 mL  237 mL Oral TID BM Merwyn KatosSimonds, David B, MD   237 mL at 05/16/18 2008  . fluticasone (FLONASE) 50 MCG/ACT nasal spray 1 spray  1 spray Each Nare PRN Christeen DouglasBeasley, Bethany, MD      . HYDROmorphone (DILAUDID) injection 0.5 mg  0.5 mg Intravenous Q8H PRN Christeen DouglasBeasley, Bethany, MD   0.5 mg at 05/17/18 0125  . lidocaine (XYLOCAINE) 5 % ointment   Topical BID PRN Merwyn KatosSimonds, David B, MD      . Melatonin TABS 5 mg  5 mg Oral QHS PRN Tukov-Yual, Magdalene S, NP   5 mg at 05/08/18 0019  . menthol-cetylpyridinium (CEPACOL) lozenge 3 mg  1 lozenge Oral Q2H PRN Ray ChurchBell, Eugene D III, MD      . mirabegron ER [facility](MYRBETRIQ) tablet 50 mg  50 mg Oral Daily Vanna ScotlandBrandon, Ashley,  MD   50 mg at 05/15/18 1035  . mirtazapine (REMERON) tablet 15 mg  15 mg Oral QHS Clapacs, John T, MD   15 mg at 05/16/18 2215  . multivitamin with minerals tablet 1 tablet  1 tablet Oral Daily Christeen Douglas, MD   1 tablet at 05/16/18 1027  . nystatin (MYCOSTATIN) 100000 UNIT/ML suspension 500,000 Units  5 mL Oral QID Conforti, John, DO   500,000 Units at 05/16/18 2215  . ondansetron (ZOFRAN) injection 4 mg  4 mg Intravenous Q6H PRN Ray Church III, MD   4 mg at 05/16/18 0141  . opium-belladonna (B&O SUPPRETTES) 16.2-60 MG suppository 1 suppository  1 suppository Rectal Q8H PRN Rene Paci, MD   1 suppository at 05/15/18 1135  . oxyCODONE-acetaminophen (PERCOCET/ROXICET) 5-325 MG per tablet 1-2 tablet  1-2 tablet Oral Q6H PRN Christeen Douglas, MD   2 tablet at 05/17/18 0454  . pantoprazole (PROTONIX) EC tablet 40 mg  40  mg Oral Daily Christeen Douglas, MD   40 mg at 05/16/18 1027  . phenazopyridine (PYRIDIUM) tablet 200 mg  200 mg Oral Q8H PRN Rene Paci, MD   200 mg at 05/11/18 0004  . phenol (CHLORASEPTIC) mouth spray 1 spray  1 spray Mouth/Throat PRN Merwyn Katos, MD      . polyethylene glycol (MIRALAX / GLYCOLAX) packet 17 g  17 g Oral Daily Christeen Douglas, MD   17 g at 05/16/18 1027  . promethazine (PHENERGAN) injection 12.5-25 mg  12.5-25 mg Intravenous Q6H PRN Tukov-Yual, Magdalene S, NP   25 mg at 05/03/18 0000  . sodium chloride flush (NS) 0.9 % injection 10-40 mL  10-40 mL Intracatheter Q12H Eugenie Norrie, NP   10 mL at 05/16/18 2215  . sodium chloride flush (NS) 0.9 % injection 10-40 mL  10-40 mL Intracatheter PRN Eugenie Norrie, NP   20 mL at 05/11/18 1019     Objective: Vital signs in last 24 hours: Temp:  [97.7 F (36.5 C)-98.8 F (37.1 C)] 98.3 F (36.8 C) (06/27 0624) Pulse Rate:  [114-123] 114 (06/27 0624) Resp:  [20-24] 20 (06/27 0624) BP: (117-131)/(57-77) 120/63 (06/27 0624) SpO2:  [97 %-100 %] 97 % (06/27 0624)  Intake/Output from previous day: 06/26 0701 - 06/27 0700 In: 850 [P.O.:480; I.V.:20; Blood:350] Out: 25 [Urine:25] Intake/Output this shift: No intake/output data recorded.   Physical Exam: alert, in no acute distress  Lab Results:  Recent Labs    05/15/18 0500 05/16/18 0739 05/17/18 0512  WBC 11.1*  --  11.5*  HGB 7.6* 9.5* 9.4*  HCT 21.9* 27.1* 27.2*  PLT 303  --  274   BMET Recent Labs    05/16/18 0741 05/17/18 0512  NA 128* 126*  128*  K 4.0 3.9  4.0  CL 93* 91*  93*  CO2 24 23  23   GLUCOSE 123* 120*  123*  BUN 33* 36*  37*  CREATININE 2.30* 2.97*  2.96*  CALCIUM 7.8* 7.7*  7.8*   PT/INR No results for input(s): LABPROT, INR in the last 72 hours. ABG No results for input(s): PHART, HCO3 in the last 72 hours.  Invalid input(s): PCO2, PO2  Studies/Results: US Renal  Result Date: 05/16/2018 CLINICAL DATA:   Acute kidney failure, unspecified. EXAM: RENAL / URINARY TRACT ULTRASOUND COMPLETE COMPARISON:  CTA of the abdomen and pelvis 05/11/2018. Renal ultrasound 05/06/2018. FINDINGS: Right Kidney: Length: 10.0 cm within limits. Renal parenchyma is within normal limits. No discrete lesions are seen. Moderate  hydronephrosis is present. No obstructing mass lesion is present. Left Kidney: Length: 11.7 cm, within normal limits. Renal parenchyma is within normal limits. No discrete lesions are seen. Moderate hydronephrosis is present. No obstructing mass lesion is present. Bladder: Not well visualized. IMPRESSION: 1. Moderate bilateral hydronephrosis remains. 2. No parenchymal lesions. Electronically Signed   By: Marin Roberts M.D.   On: 05/16/2018 12:51     Assessment/recommendation: Creatinine this morning increased to 2.97.  I discussed cystoscopy with right retrograde pyelogram and possible right ureteral stent placement.  Her prior CT and ultrasounds have shown persistent left hydronephrosis and also discussed exchange of her left ureteral stent.  She is agreeable and will plan on scheduling tomorrow morning.  At the time of her initial cystoscopy her right ureteral orifice was difficult to identify and if not successful will discuss with IR placement of a right percutaneous nephrostomy tube if stent placement unsuccessful.  The procedure was discussed in detail including potential risks of bleeding, infection.     LOS: 20 days    Riki Altes 05/17/2018

## 2018-05-17 NOTE — Progress Notes (Signed)
Patient ID: [name], female   DOB: 10/22/1976, 42 y.o.   MRN: [id number] 19 Days Post-Op  Procedure(s):  1. 04/28/18: ATTEMPTED LAPAROSCOPIC HYSTERECTOMY CONVERTED TO SUPRACERVICAL ABDOMINAL HYSTERECTOMY (N/A) SALPINGO OOPHORECTOMY (Left) CYSTOSCOPY (N/A) UNILATERAL SALPINGECTOMY (Right) LAPAROSCOPIC LYSIS OF ADHESIONS, RETROPERITONEAL DISSECTION  2. 04/29/18: CYSTOSCOPY WITH RETROGRADE PYELOGRAM/Left URETERAL STENT PLACEMENT, ureteroscopy (Bilateral)    Subjective: Pt outside by wheelchair today, working with physical therapy.  Working with nutrition to improve protein status  Creatinine rising again, possibly as a result of iv contrast several days ago. Moderate bilateral hydronephrosis on repeat renal u/s but no obstruction visualized and no hydroureter mentioned. Urology considering bilateral stents with cystoscopy tomorrow.  Objective: Vital signs in last 24 hours: Temp:  [98.3 F (36.8 C)-98.8 F (37.1 C)] 98.3 F (36.8 C) (06/27 1343) Pulse Rate:  [77-122] 77 (06/27 1343) Resp:  [20] 20 (06/27 0624) BP: (120-131)/(57-73) 120/73 (06/27 1343) SpO2:  [97 %-100 %] 97 % (06/27 0624)  Intake/Output  Intake/Output Summary (Last 24 hours) at 05/17/2018 1354 Last data filed at 05/17/2018 [id number] Gross per 24 hour  Intake 240 ml  Output -  Net 240 ml    Physical Exam:  General: Oriented and responsive, appears more cheerful GI: Non-tender, non-distended. Normal bowel sounds. Ecchymosis over mons improving, and lap incisions clean and dry. Pitting edema to 3cm above umbilicus.  Incisions: Clean and dry, steris intact Urine: Clear Extremities: Nontender, no erythema, LE wrapped.   Lab Results: Recent Labs    05/15/18 0500 05/16/18 0739 05/17/18 0512  HGB 7.6* 9.5* 9.4*  HCT 21.9* 27.1* 27.2*  WBC 11.1*  --  11.5*  PLT 303  --  274                 Results for orders placed or performed during the hospital encounter of 04/27/18 (from the past 24 hour(s))  Renal  function panel     Status: Abnormal   Collection Time: 05/17/18  5:12 AM  Result Value Ref Range   Sodium 128 (L) 135 - 145 mmol/L   Potassium 4.0 3.5 - 5.1 mmol/L   Chloride 93 (L) 98 - 111 mmol/L   CO2 23 22 - 32 mmol/L   Glucose, Bld 123 (H) 70 - 99 mg/dL   BUN 37 (H) 6 - 20 mg/dL   Creatinine, Ser 0.932.96 (H) 0.44 - 1.00 mg/dL   Calcium 7.8 (L) 8.9 - 10.3 mg/dL   Phosphorus 3.9 2.5 - 4.6 mg/dL   Albumin 2.3 (L) 3.5 - 5.0 g/dL   GFR calc non Af Amer 18 (L) >60 mL/min   GFR calc Af Amer 21 (L) >60 mL/min   Anion gap 12 5 - 15  CBC     Status: Abnormal   Collection Time: 05/17/18  5:12 AM  Result Value Ref Range   WBC 11.5 (H) 3.6 - 11.0 K/uL   RBC 2.98 (L) 3.80 - 5.20 MIL/uL   Hemoglobin 9.4 (L) 12.0 - 16.0 g/dL   HCT 23.527.2 (L) 57.335.0 - 22.047.0 %   MCV 91.4 80.0 - 100.0 fL   MCH 31.4 26.0 - 34.0 pg   MCHC 34.4 32.0 - 36.0 g/dL   RDW 25.416.5 (H) 27.011.5 - 62.314.5 %   Platelets 274 150 - 440 K/uL  Comprehensive metabolic panel     Status: Abnormal   Collection Time: 05/17/18  5:12 AM  Result Value Ref Range   Sodium 126 (L) 135 - 145 mmol/L   Potassium 3.9 3.5 - 5.1 mmol/L  Chloride 91 (L) 98 - 111 mmol/L   CO2 23 22 - 32 mmol/L   Glucose, Bld 120 (H) 70 - 99 mg/dL   BUN 36 (H) 6 - 20 mg/dL   Creatinine, Ser 1.61 (H) 0.44 - 1.00 mg/dL   Calcium 7.7 (L) 8.9 - 10.3 mg/dL   Total Protein 6.3 (L) 6.5 - 8.1 g/dL   Albumin 2.2 (L) 3.5 - 5.0 g/dL   AST 45 (H) 15 - 41 U/L   ALT 5 0 - 44 U/L   Alkaline Phosphatase 253 (H) 38 - 126 U/L   Total Bilirubin 0.8 0.3 - 1.2 mg/dL   GFR calc non Af Amer 18 (L) >60 mL/min   GFR calc Af Amer 21 (L) >60 mL/min   Anion gap 12 5 - 15    Assessment/Plan: 19 Days Post-Op        Procedure(s): 1. 04/28/18: ATTEMPTED LAPAROSCOPIC HYSTERECTOMY CONVERTED TO SUPRACERVICAL ABDOMINAL HYSTERECTOMY (N/A) SALPINGO OOPHORECTOMY (Left) CYSTOSCOPY (N/A) UNILATERAL SALPINGECTOMY (Right) LAPAROSCOPIC LYSIS OF ADHESIONS, RETROPERITONEAL DISSECTION   2. 04/29/18:  CYSTOSCOPY WITH RETROGRADE PYELOGRAM/Left URETERAL STENT PLACEMENT, ureteroscopy (Bilateral)  IMP: Generalized edema Anemia  PLAN/REC:  - Needs dispo planning. Case management working with patient and family. Home health order at discharge placed for their skilled RN to help with needs and wrapping LE.  That referral has been placed, as well as the orders for her durable medical equipment.  - Anemia: s/p repeat transfusion, H/H stable today. S/p ppx heparin 5000u TID since surgery.  - Vascular surgery - appreciate their recs for long term management.   - Urology for management of hematuria, hydro. Renal scan today with bilateral moderate hydronephrosis. F/u cystoscopy. ON po lasix daily now.  - Nephrology following, monitoring creatinine as well.  - Continuing to need pain meds for management of movement. Discussed need for titration off prior to discharge. Continue transition to po pain meds.  - Nutrition following to improve status. Albumin still around 2 and not rising. Recommend supplementation to 100g protein daily.   Christeen Douglas, MD Cell phone: [phone]   LOS: 20 days   Christeen Douglas 05/17/2018, 1:54 PMPatient ID: [name], female   DOB: September 12, 1976, 42 y.o.   MRN: [id number]

## 2018-05-17 NOTE — Progress Notes (Signed)
[facility]  Daily Progress Note   Subjective  - 19 Days Post-Op  Had a pretty good night Continues to tolerate wraps and TED hose  Objective Vitals:   05/16/18 1400 05/16/18 2011 05/17/18 0624 05/17/18 1343  BP:  (!) 131/57 120/63 120/73  Pulse: (!) 115 (!) 122 (!) 114 77  Resp:  20 20   Temp:  98.8 F (37.1 C) 98.3 F (36.8 C) 98.3 F (36.8 C)  TempSrc:  Oral Oral Oral  SpO2:  100% 97%   Weight:      Height:        Intake/Output Summary (Last 24 hours) at 05/17/2018 1810 Last data filed at 05/17/2018 [id number] Gross per 24 hour  Intake 240 ml  Output -  Net 240 ml    PULM  Normal effort , no use of accessory muscles CV  No JVD, RRR Abd      No distended, nontender VASC  3-4+ edema  Laboratory CBC    Component Value Date/Time   WBC 11.5 (H) 05/17/2018 0512   HGB 9.4 (L) 05/17/2018 0512   HCT 27.2 (L) 05/17/2018 0512   PLT 274 05/17/2018 0512    BMET    Component Value Date/Time   NA 128 (L) 05/17/2018 0512   NA 126 (L) 05/17/2018 0512   K 4.0 05/17/2018 0512   K 3.9 05/17/2018 0512   CL 93 (L) 05/17/2018 0512   CL 91 (L) 05/17/2018 0512   CO2 23 05/17/2018 0512   CO2 23 05/17/2018 0512   GLUCOSE 123 (H) 05/17/2018 0512   GLUCOSE 120 (H) 05/17/2018 0512   BUN 37 (H) 05/17/2018 0512   BUN 36 (H) 05/17/2018 0512   CREATININE 2.96 (H) 05/17/2018 0512   CREATININE 2.97 (H) 05/17/2018 0512   CALCIUM 7.8 (L) 05/17/2018 0512   CALCIUM 7.7 (L) 05/17/2018 0512   GFRNONAA 18 (L) 05/17/2018 0512   GFRNONAA 18 (L) 05/17/2018 0512   GFRAA 21 (L) 05/17/2018 0512   GFRAA 21 (L) 05/17/2018 [id number]    Assessment/Planning:    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  Her hemoglobin remains stable so anticoagulation may be possible.  However, at this time she is having recurrent urological and nephrology issues.   And therefore I would not initiate anticoagulation at this time.  Her creatinine is stable compared to yesterday which is good news     Levora DredgeGregory Schnier  05/17/2018, 6:10 PM

## 2018-05-17 NOTE — Clinical Social Work Note (Signed)
CSW received consult for SNF placement for patient. CSW spoke with patient and she states that she is going home with husband and family. She does not want to go to SNF or CIR at this time. CSW signing off. Please re consult if further needs arise.   Ruthe Mannanandace Jodel Mayhall MSW, [address] Archer RdCSWA [phone]

## 2018-05-17 NOTE — Progress Notes (Signed)
Nutrition Follow-up  DOCUMENTATION CODES:   Obesity unspecified  INTERVENTION:   Continue Ensure Enlive po TID, each supplement provides 350 kcal and 20 grams of protein. Encouraged patient to drink ALL 3 containers of Ensure daily.  Continue Magic cup TID with meals, each supplement provides 290 kcal and 9 grams of protein.  Provide daily MVI.  Daily weights   NUTRITION DIAGNOSIS:   Inadequate oral intake related to acute illness(AKI, recent surgery) as evidenced by (On Clear Liquid Diet).  Intake remains inadequate though diet has been advanced.  GOAL:   Patient will meet greater than or equal to 90% of their needs  Progressing.  MONITOR:   PO intake, Supplement acceptance, Labs, Weight trends, I & O's  ASSESSMENT:   42 year old female with PMHx GERD, DVT, who on 6/7 underwent total hysterectomy, R salpingectomy, L salping-oophorectomy, lysis of adhesions, retroperitoneal dissection, pt with extensive blood loss, developed hemorrhagic shock requiring massive transfusion protocol. Developed hematuria, L hydronephrosis, R hydronephrosis, underwent cytoscopy 6/9 and L ureteral stent, 6/10 R nephrostomy tube.   Pt with improved appetite and oral intake; ate 70% of her breakfast yesterday and is drinking some Ensure. Pt with inadequate oral intake since admit; pt is not meet her estimated needs. Pt on remeron; pt may benefit from Megace. No new weight since admit; will request daily weights. Pt -15.9L on I & O's. Pt with minimal urine output and with moderate to severe anasarca. Pt scheduled for right retrograde pyelogram and possible right ureteral stent placement tomorrow. Pt would benefit from NGT placement and nutrition support; will consider this if oral intake does not keep improving.   Medications reviewed and include: Colace, lasix, Remeron 15 mg QHS, MVI, pantoprazole, Miralax, cefazolin, hydromorphone, oxycodone   Labs reviewed: Na 126(L), K 3.9 wnl, P 3.9 wnl, Cl  91(L), BUN 36(H), creat 2.97(H), Ca 7.7(L) adj. 9.14 wnl, alb 2.2(L) Wbc- 11.5(H), Hgb 9.4(L), Hct 27.2(L)  Diet Order:   Diet Order           Diet NPO time specified Except for: Sips with Meds  Diet effective midnight        Diet regular Room service appropriate? Yes; Fluid consistency: Thin  Diet effective now         EDUCATION NEEDS:   No education needs have been identified at this time  Skin:  Skin Assessment: Skin Integrity Issues: Skin Integrity Issues:: Incisions Incisions: closed incisions to abdomen, vagina, and perineum  Last BM:  6/24- type 7  Height:   Ht Readings from Last 1 Encounters:  05/04/18 5\' 5"  (1.651 m)    Weight:   Wt Readings from Last 1 Encounters:  05/03/18 205 lb (93 kg)    Ideal Body Weight:  56.81 kg  BMI:  Body mass index is 34.11 kg/m.  Estimated Nutritional Needs:   Kcal:  1991-2150 (MSJ x1.2-1.35)  Protein:  100-120 grams  Fluid:  UOP +57500mL  Betsey Holidayasey Shenea Giacobbe MS, RD, LDN Pager #- [phone] Office#- [phone] After Hours Pager: [phone]

## 2018-05-17 NOTE — H&P (View-Only) (Signed)
Urology Consult Follow Up  Subjective: No further flank pain.  Urine output improved after Lasix administration.  No significant gross hematuria.  Anti-infectives: Anti-infectives (From admission, onward)   Start     Dose/Rate Route Frequency Ordered Stop   05/10/18 1500  ceFAZolin (ANCEF) IVPB 2g/100 mL premix  Status:  Discontinued     2 g 200 mL/hr over 30 Minutes Intravenous Every 8 hours 05/10/18 1453 05/15/18 1227   05/06/18 2200  meropenem (MERREM) 1 g in sodium chloride 0.9 % 100 mL IVPB  Status:  Discontinued     1 g 200 mL/hr over 30 Minutes Intravenous Every 12 hours 05/06/18 1305 05/07/18 0845   05/02/18 1000  meropenem (MERREM) 500 mg in sodium chloride 0.9 % 100 mL IVPB  Status:  Discontinued     500 mg 200 mL/hr over 30 Minutes Intravenous Every 12 hours 05/02/18 0911 05/06/18 1305   05/01/18 2200  piperacillin-tazobactam (ZOSYN) IVPB 3.375 g  Status:  Discontinued     3.375 g 12.5 mL/hr over 240 Minutes Intravenous Every 12 hours 05/01/18 1952 05/02/18 0838   04/30/18 2200  piperacillin-tazobactam (ZOSYN) IVPB 3.375 g  Status:  Discontinued     3.375 g 12.5 mL/hr over 240 Minutes Intravenous Every 12 hours 04/30/18 0833 05/01/18 1509   04/29/18 0330  piperacillin-tazobactam (ZOSYN) IVPB 3.375 g  Status:  Discontinued     3.375 g 12.5 mL/hr over 240 Minutes Intravenous Every 8 hours 04/29/18 0317 04/30/18 0833   04/27/18 0615  ceFAZolin (ANCEF) 2-4 GM/100ML-% IVPB    Note to Pharmacy:  Jackson, Pamela   : cabinet override      04/27/18 0615 04/27/18 0746   04/27/18 0600  ceFAZolin (ANCEF) IVPB 2g/100 mL premix     2 g 200 mL/hr over 30 Minutes Intravenous On call to O.R. 04/26/18 2240 04/27/18 1142      Current Facility-Administered Medications  Medication Dose Route Frequency Provider Last Rate Last Dose  . 0.9 %  sodium chloride infusion (Manually program via Guardrails IV Fluids)   Intravenous Once Beasley, Bethany, MD      . ALPRAZolam (XANAX) tablet 1 mg  1  mg Oral TID PRN Beasley, Bethany, MD   1 mg at 05/17/18 0334  . calcium carbonate (TUMS - dosed in mg elemental calcium) chewable tablet 200 mg of elemental calcium  1 tablet Oral 5 X Daily PRN Beasley, Bethany, MD   200 mg of elemental calcium at 05/12/18 1235  . docusate sodium (COLACE) capsule 100 mg  100 mg Oral BID Beasley, Bethany, MD   100 mg at 05/16/18 2214  . feeding supplement (ENSURE ENLIVE) (ENSURE ENLIVE) liquid 237 mL  237 mL Oral TID BM Simonds, David B, MD   237 mL at 05/16/18 2008  . fluticasone (FLONASE) 50 MCG/ACT nasal spray 1 spray  1 spray Each Nare PRN Beasley, Bethany, MD      . HYDROmorphone (DILAUDID) injection 0.5 mg  0.5 mg Intravenous Q8H PRN Beasley, Bethany, MD   0.5 mg at 05/17/18 0125  . lidocaine (XYLOCAINE) 5 % ointment   Topical BID PRN Simonds, David B, MD      . Melatonin TABS 5 mg  5 mg Oral QHS PRN Tukov-Yual, Magdalene S, NP   5 mg at 05/08/18 0019  . menthol-cetylpyridinium (CEPACOL) lozenge 3 mg  1 lozenge Oral Q2H PRN Bell, Eugene D III, MD      . mirabegron ER (MYRBETRIQ) tablet 50 mg  50 mg Oral Daily Brandon, Ashley,   MD   50 mg at 05/15/18 1035  . mirtazapine (REMERON) tablet 15 mg  15 mg Oral QHS Clapacs, John T, MD   15 mg at 05/16/18 2215  . multivitamin with minerals tablet 1 tablet  1 tablet Oral Daily Beasley, Bethany, MD   1 tablet at 05/16/18 1027  . nystatin (MYCOSTATIN) 100000 UNIT/ML suspension 500,000 Units  5 mL Oral QID Conforti, John, DO   500,000 Units at 05/16/18 2215  . ondansetron (ZOFRAN) injection 4 mg  4 mg Intravenous Q6H PRN Bell, Eugene D III, MD   4 mg at 05/16/18 0141  . opium-belladonna (B&O SUPPRETTES) 16.2-60 MG suppository 1 suppository  1 suppository Rectal Q8H PRN Winter, Christopher Aaron, MD   1 suppository at 05/15/18 1135  . oxyCODONE-acetaminophen (PERCOCET/ROXICET) 5-325 MG per tablet 1-2 tablet  1-2 tablet Oral Q6H PRN Beasley, Bethany, MD   2 tablet at 05/17/18 0454  . pantoprazole (PROTONIX) EC tablet 40 mg  40  mg Oral Daily Beasley, Bethany, MD   40 mg at 05/16/18 1027  . phenazopyridine (PYRIDIUM) tablet 200 mg  200 mg Oral Q8H PRN Winter, Christopher Aaron, MD   200 mg at 05/11/18 0004  . phenol (CHLORASEPTIC) mouth spray 1 spray  1 spray Mouth/Throat PRN Simonds, David B, MD      . polyethylene glycol (MIRALAX / GLYCOLAX) packet 17 g  17 g Oral Daily Beasley, Bethany, MD   17 g at 05/16/18 1027  . promethazine (PHENERGAN) injection 12.5-25 mg  12.5-25 mg Intravenous Q6H PRN Tukov-Yual, Magdalene S, NP   25 mg at 05/03/18 0000  . sodium chloride flush (NS) 0.9 % injection 10-40 mL  10-40 mL Intracatheter Q12H Blakeney, Dana G, NP   10 mL at 05/16/18 2215  . sodium chloride flush (NS) 0.9 % injection 10-40 mL  10-40 mL Intracatheter PRN Blakeney, Dana G, NP   20 mL at 05/11/18 1019     Objective: Vital signs in last 24 hours: Temp:  [97.7 F (36.5 C)-98.8 F (37.1 C)] 98.3 F (36.8 C) (06/27 0624) Pulse Rate:  [114-123] 114 (06/27 0624) Resp:  [20-24] 20 (06/27 0624) BP: (117-131)/(57-77) 120/63 (06/27 0624) SpO2:  [97 %-100 %] 97 % (06/27 0624)  Intake/Output from previous day: 06/26 0701 - 06/27 0700 In: 850 [P.O.:480; I.V.:20; Blood:350] Out: 25 [Urine:25] Intake/Output this shift: No intake/output data recorded.   Physical Exam: alert, in no acute distress  Lab Results:  Recent Labs    05/15/18 0500 05/16/18 0739 05/17/18 0512  WBC 11.1*  --  11.5*  HGB 7.6* 9.5* 9.4*  HCT 21.9* 27.1* 27.2*  PLT 303  --  274   BMET Recent Labs    05/16/18 0741 05/17/18 0512  NA 128* 126*  128*  K 4.0 3.9  4.0  CL 93* 91*  93*  CO2 24 23  23  GLUCOSE 123* 120*  123*  BUN 33* 36*  37*  CREATININE 2.30* 2.97*  2.96*  CALCIUM 7.8* 7.7*  7.8*   PT/INR No results for input(s): LABPROT, INR in the last 72 hours. ABG No results for input(s): PHART, HCO3 in the last 72 hours.  Invalid input(s): PCO2, PO2  Studies/Results: Us Renal  Result Date: 05/16/2018 CLINICAL DATA:   Acute kidney failure, unspecified. EXAM: RENAL / URINARY TRACT ULTRASOUND COMPLETE COMPARISON:  CTA of the abdomen and pelvis 05/11/2018. Renal ultrasound 05/06/2018. FINDINGS: Right Kidney: Length: 10.0 cm within limits. Renal parenchyma is within normal limits. No discrete lesions are seen. Moderate   hydronephrosis is present. No obstructing mass lesion is present. Left Kidney: Length: 11.7 cm, within normal limits. Renal parenchyma is within normal limits. No discrete lesions are seen. Moderate hydronephrosis is present. No obstructing mass lesion is present. Bladder: Not well visualized. IMPRESSION: 1. Moderate bilateral hydronephrosis remains. 2. No parenchymal lesions. Electronically Signed   By: Christopher  Mattern M.D.   On: 05/16/2018 12:51     Assessment/recommendation: Creatinine this morning increased to 2.97.  I discussed cystoscopy with right retrograde pyelogram and possible right ureteral stent placement.  Her prior CT and ultrasounds have shown persistent left hydronephrosis and also discussed exchange of her left ureteral stent.  She is agreeable and will plan on scheduling tomorrow morning.  At the time of her initial cystoscopy her right ureteral orifice was difficult to identify and if not successful will discuss with IR placement of a right percutaneous nephrostomy tube if stent placement unsuccessful.  The procedure was discussed in detail including potential risks of bleeding, infection.     LOS: 20 days    Damarko Stitely C Dany Walther 05/17/2018  

## 2018-05-17 NOTE — Progress Notes (Signed)
[facility]  ROUNDING NOTE   Subjective:   Son at bedside.   Creatinine 2.97 (2.3)  Furosemide 20mg  IV with good urine output response.   Objective:  Vital signs in last 24 hours:  Temp:  [97.7 F (36.5 C)-98.8 F (37.1 C)] 98.3 F (36.8 C) (06/27 0624) Pulse Rate:  [114-123] 114 (06/27 0624) Resp:  [20-24] 20 (06/27 0624) BP: (117-131)/(57-77) 120/63 (06/27 0624) SpO2:  [97 %-100 %] 97 % (06/27 0624)  Weight change:  Filed Weights   05/03/18 0800  Weight: 93 kg (205 lb)    Intake/Output: I/O last 3 completed shifts: In: 1285 [P.O.:480; I.V.:95; Blood:710] Out: 25 [Urine:25]   Intake/Output this shift:  No intake/output data recorded.  Physical Exam: General: NAD,   Head: Normocephalic, atraumatic. Moist oral mucosal membranes  Eyes: Anicteric, PERRL  Neck: Supple, trachea midline  Lungs:  Clear to auscultation  Heart: Regular rate and rhythm  Abdomen:  Soft, nontender  Extremities: 1+ peripheral edema.  Neurologic: Nonfocal, moving all four extremities  Skin: No lesions        Basic Metabolic Panel: Recent Labs  Lab 05/13/18 0513 05/14/18 0554 05/15/18 0500 05/15/18 0611 05/16/18 0741 05/17/18 0512  NA 130* 128* 128*  --  128* 126*  128*  K 3.7 3.3* 3.4*  --  4.0 3.9  4.0  CL 95* 93* 93*  --  93* 91*  93*  CO2 25 25 25   --  24 23  23   GLUCOSE 146* 122* 113*  --  123* 120*  123*  BUN 21* 22* 24*  --  33* 36*  37*  CREATININE 0.95 0.95 1.37*  --  2.30* 2.97*  2.96*  CALCIUM 8.0* 7.9* 7.8*  --  7.8* 7.7*  7.8*  MG  --   --   --  1.6*  --   --   PHOS  --   --   --  3.0 3.7 3.9    Liver Function Tests: Recent Labs  Lab 05/12/18 0517 05/13/18 0513 05/14/18 0554 05/15/18 0500 05/16/18 0741 05/17/18 0512  AST 63* 57* 52* 53*  --  45*  ALT 42 25 20 14   --  5  ALKPHOS 215* 236* 215* 254*  --  253*  BILITOT 0.9 0.7 0.7 0.7  --  0.8  PROT 6.2* 6.0* 6.2* 6.5  --  6.3*  ALBUMIN 2.9* 2.5* 2.3* 2.6* 2.3* 2.2*  2.3*   No  results for input(s): LIPASE, AMYLASE in the last 168 hours. No results for input(s): AMMONIA in the last 168 hours.  CBC: Recent Labs  Lab 05/12/18 0517 05/13/18 0513 05/14/18 0554 05/15/18 0500 05/16/18 0739 05/17/18 0512  WBC 18.1* 14.9* 12.6* 11.1*  --  11.5*  HGB 9.0* 8.3* 7.7* 7.6* 9.5* 9.4*  HCT 26.1* 24.5* 22.5* 21.9* 27.1* 27.2*  MCV 89.6 91.4 91.0 91.0  --  91.4  PLT 319 294 305 303  --  274    Cardiac Enzymes: No results for input(s): CKTOTAL, CKMB, CKMBINDEX, TROPONINI in the last 168 hours.  BNP: Invalid input(s): POCBNP  CBG: Recent Labs  Lab 05/13/18 1110  GLUCAP 145*    Microbiology: Results for orders placed or performed during the hospital encounter of 04/27/18  MRSA PCR Screening     Status: None   Collection Time: 04/27/18 10:27 PM  Result Value Ref Range Status   MRSA by PCR NEGATIVE NEGATIVE Final    Comment:        The GeneXpert MRSA Assay (  FDA approved for NASAL specimens only), is one component of a comprehensive MRSA colonization surveillance program. It is not intended to diagnose MRSA infection nor to guide or monitor treatment for MRSA infections. Performed at [facility], [address] [place], [place] [address]   CULTURE, BLOOD (ROUTINE X 2) w Reflex to ID Panel     Status: None   Collection Time: 04/28/18  9:24 PM  Result Value Ref Range Status   Specimen Description BLOOD RIGHT HAND  Final   Special Requests   Final    BOTTLES DRAWN AEROBIC AND ANAEROBIC Blood Culture adequate volume   Culture   Final    NO GROWTH 5 DAYS Performed at [facility], [address] [place], [place] [address]    Report Status 05/03/2018 FINAL  Final  CULTURE, BLOOD (ROUTINE X 2) w Reflex to ID Panel     Status: None   Collection Time: 04/28/18  9:44 PM  Result Value Ref Range Status   Specimen Description BLOOD RIGHT ANTECUBITAL  Final   Special Requests   Final    BOTTLES DRAWN AEROBIC AND ANAEROBIC Blood Culture  adequate volume   Culture   Final    NO GROWTH 5 DAYS Performed at [facility], [address], [place], [place] [address]    Report Status 05/03/2018 FINAL  Final  Urine Culture     Status: None   Collection Time: 05/09/18  4:30 PM  Result Value Ref Range Status   Specimen Description   Final    URINE, CATHETERIZED Performed at [facility], [address], [place], [place] [address]    Special Requests   Final    Normal Performed at [facility], [address], [place], [place] [address]    Culture   Final    NO GROWTH Performed at [facility], [address] [place], [place] [address]    Report Status 05/10/2018 FINAL  Final  CULTURE, BLOOD (ROUTINE X 2) w Reflex to ID Panel     Status: Abnormal   Collection Time: 05/09/18  6:02 PM  Result Value Ref Range Status   Specimen Description   Final    BLOOD LEFT HAND Performed at [facility], [address], [place], [place] [address]    Special Requests   Final    BOTTLES DRAWN AEROBIC AND ANAEROBIC Blood Culture adequate volume Performed at [facility], [address] [place], [place] [address]    Culture  Setup Time   Final    AEROBIC BOTTLE ONLY GRAM POSITIVE COCCI CRITICAL RESULT CALLED TO, READ BACK BY AND VERIFIED WITH: LISA KLUTTZ 05/10/18 1433 KLW    Culture (A)  Final    STAPHYLOCOCCUS SPECIES (COAGULASE NEGATIVE) THE SIGNIFICANCE OF ISOLATING THIS ORGANISM FROM A SINGLE SET OF BLOOD CULTURES WHEN MULTIPLE SETS ARE DRAWN IS UNCERTAIN. PLEASE NOTIFY THE MICROBIOLOGY DEPARTMENT WITHIN ONE WEEK IF SPECIATION AND SENSITIVITIES ARE REQUIRED. Performed at [facility], [address], [place], [place] [address]    Report Status 05/12/2018 FINAL  Final  Blood Culture ID Panel (Reflexed)     Status: Abnormal   Collection Time: 05/09/18  6:02 PM  Result Value Ref Range Status   Enterococcus species NOT DETECTED NOT DETECTED Final   Listeria monocytogenes  NOT DETECTED NOT DETECTED Final   Staphylococcus species DETECTED (A) NOT DETECTED Final    Comment: Methicillin (oxacillin) susceptible coagulase negative staphylococcus. Possible blood culture contaminant (unless isolated  from more than one blood culture draw or clinical case suggests pathogenicity). No antibiotic treatment is indicated for blood  culture contaminants. CRITICAL RESULT CALLED TO, READ BACK BY AND VERIFIED WITH: LISA KLUTTZ 05/10/18 1433 KLW    Staphylococcus aureus NOT DETECTED NOT DETECTED Final   Methicillin resistance NOT DETECTED NOT DETECTED Final   Streptococcus species NOT DETECTED NOT DETECTED Final   Streptococcus agalactiae NOT DETECTED NOT DETECTED Final   Streptococcus pneumoniae NOT DETECTED NOT DETECTED Final   Streptococcus pyogenes NOT DETECTED NOT DETECTED Final   Acinetobacter baumannii NOT DETECTED NOT DETECTED Final   Enterobacteriaceae species NOT DETECTED NOT DETECTED Final   Enterobacter cloacae complex NOT DETECTED NOT DETECTED Final   Escherichia coli NOT DETECTED NOT DETECTED Final   Klebsiella oxytoca NOT DETECTED NOT DETECTED Final   Klebsiella pneumoniae NOT DETECTED NOT DETECTED Final   Proteus species NOT DETECTED NOT DETECTED Final   Serratia marcescens NOT DETECTED NOT DETECTED Final   Haemophilus influenzae NOT DETECTED NOT DETECTED Final   Neisseria meningitidis NOT DETECTED NOT DETECTED Final   Pseudomonas aeruginosa NOT DETECTED NOT DETECTED Final   Candida albicans NOT DETECTED NOT DETECTED Final   Candida glabrata NOT DETECTED NOT DETECTED Final   Candida krusei NOT DETECTED NOT DETECTED Final   Candida parapsilosis NOT DETECTED NOT DETECTED Final   Candida tropicalis NOT DETECTED NOT DETECTED Final    Comment: Performed at [facility], [address] [place], [place] [address]  CULTURE, BLOOD (ROUTINE X 2) w Reflex to ID Panel     Status: None   Collection Time: 05/09/18  7:00 PM  Result Value Ref Range Status    Specimen Description BLOOD RIGHT HAND  Final   Special Requests   Final    BOTTLES DRAWN AEROBIC AND ANAEROBIC Blood Culture adequate volume   Culture   Final    NO GROWTH 5 DAYS Performed at [facility], [address] [place], [place] [address]    Report Status 05/14/2018 FINAL  Final  CULTURE, BLOOD (ROUTINE X 2) w Reflex to ID Panel     Status: None   Collection Time: 05/10/18  7:10 PM  Result Value Ref Range Status   Specimen Description BLOOD LINE DRAW PER MD  Final   Special Requests   Final    BOTTLES DRAWN AEROBIC AND ANAEROBIC Blood Culture results may not be optimal due to an excessive volume of blood received in culture bottles   Culture   Final    NO GROWTH 5 DAYS Performed at [facility], [address] [place], [place] [address]    Report Status 05/15/2018 FINAL  Final    Coagulation Studies: No results for input(s): LABPROT, INR in the last 72 hours.  Urinalysis: No results for input(s): COLORURINE, LABSPEC, PHURINE, GLUCOSEU, HGBUR, BILIRUBINUR, KETONESUR, PROTEINUR, UROBILINOGEN, NITRITE, LEUKOCYTESUR in the last 72 hours.  Invalid input(s): APPERANCEUR    Imaging: US Renal  Result Date: 05/16/2018 CLINICAL DATA:  Acute kidney failure, unspecified. EXAM: RENAL / URINARY TRACT ULTRASOUND COMPLETE COMPARISON:  CTA of the abdomen and pelvis 05/11/2018. Renal ultrasound 05/06/2018. FINDINGS: Right Kidney: Length: 10.0 cm within limits. Renal parenchyma is within normal limits. No discrete lesions are seen. Moderate hydronephrosis is present. No obstructing mass lesion is present. Left Kidney: Length: 11.7 cm, within normal limits. Renal parenchyma is within normal limits. No discrete lesions are seen. Moderate hydronephrosis is present. No obstructing mass lesion is present. Bladder: Not well visualized. IMPRESSION: 1. Moderate bilateral  hydronephrosis remains. 2. No parenchymal lesions. Electronically Signed   By: Marin Roberts M.D.   On:  05/16/2018 12:51     Medications:   . [START ON 05/18/2018]  ceFAZolin (ANCEF) IV     . sodium chloride   Intravenous Once  . docusate sodium  100 mg Oral BID  . feeding supplement (ENSURE ENLIVE)  237 mL Oral TID BM  . mirabegron ER  25 mg Oral Daily  . mirtazapine  15 mg Oral QHS  . multivitamin with minerals  1 tablet Oral Daily  . nystatin  5 mL Oral QID  . pantoprazole  40 mg Oral Daily  . polyethylene glycol  17 g Oral Daily  . sodium chloride flush  10-40 mL Intracatheter Q12H   ALPRAZolam, calcium carbonate, fluticasone, HYDROmorphone (DILAUDID) injection, lidocaine, Melatonin, menthol-cetylpyridinium, [DISCONTINUED] ondansetron **OR** ondansetron (ZOFRAN) IV, opium-belladonna, oxyCODONE-acetaminophen, phenazopyridine, phenol, promethazine, sodium chloride flush  Assessment/ Plan:  Ms. [name] is a 42 y.o. whtie female  post op abdominal hysterectomy, left salpingo-oophorectomy, laparoscopic lysis of adhesions and retroperitoneal dissection on 04/28/2018. Followed by cystoscopy with retrograde pyelogram and left ureteral stent placement on 04/29/2018 Baseline creatinine is 0.79 from 04/19/2018  1. Acute renal failure: secondary to ATN and obstructive uropathy. Creatinine has now rising. Most likely due to IV contrast exposure on 6/21.  - Appreciate urology input, cystoscopy scheduled for tomorrow.  - Continue furosemide 20mg  PO daily  2. Generalized edema/Anasarca: with albumin of 2.6\2 - Encourage PO intake.  - Low threshold for diuretics as above.  - Discussed IV albumin with patient, will consider if no further improvement.   3.  Hyponatremia.  Na 126 - most likely secondary to anasarca/volume status - encourage PO intake.  - PO furosemide  4.  Anemia from blood loss: status post PRBC transfusion 6/25 and 6/26   LOS: 20 Daniyah Fohl 6/27/201911:27 AM

## 2018-05-17 NOTE — Progress Notes (Signed)
PHARMACY NOTE: RENAL DOSAGE ADJUSTMENT  Current regimen includes a mismatch between Myrbetriq dosage and estimated renal function.  As per policy approved by the Pharmacy & Therapeutics and Medical Executive Committees, the dosage will be adjusted accordingly.  Current Myrbetriq dosage:  50mg  daily  Renal Function:  Estimated Creatinine Clearance: 27.9 mL/min (A) (by C-G formula based on SCr of 2.96 mg/dL (H)). []      On intermittent HD, scheduled: []      On CRRT    Myrbetriq dosage has been changed to:  25mg  daily  Thank you for allowing pharmacy to be a part of this patient's care.  Lowella BandyRodney D Kirstein Baxley, [facility] 05/17/2018 9:08 AM

## 2018-05-17 NOTE — Progress Notes (Signed)
Physical Therapy Treatment Patient Details Name: [name] MRN: [id number] DOB: 12/08/1975 Today's Date: 05/17/2018    History of Present Illness Pt is a 42 y/o F s/p laparascopic hysterectomy, Bil aslpingectomy 6/7.  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  Developed hematuria and L hydronephrosis 06/09 requiring cystoscopy and left ureteral stent placement. Renal US 6/10 revealed right hydronephrosis. S/P R percutaneous nephrostomy tube placement 6/10.  NG tube placed 6/10.  Pt with AKI and ileus.  Pt's PMH includes DVT.   Now with extended and complicated hospital stay, followed by OB/GYN, vascular, renal and urology for post-op management.  Cleared by physician (vascular, in particular) for participation with therapy as tolerated without restriction (per Dr. Montel ClockEscho 05/13/18)  Split session 1:32-1:43 and 2:20-2:43  PT Comments    Pt in recliner upon arrival with nursing.  Pt agreed to session stating she wanted to go outside for a while.  Stood and transferred to wide wheelchair with mod a x 2 and multiple attempts to stand and min a x 2 to turn.  She declined gait at this time stating she would after she went outside.  Returned and pt agreed to gait.  Stood from wheelchair with mod a x 2 and 3 attempts.  Pt has difficulty pushing off chair arms due to edema.  Once standing, she was able to ambulate 8' with walker and min a x 2 with recliner follow for safety.  Pt with forward lean today and heavy reliance on walker for support.  Verbal cues to improve hand position and overall safety.  Fatigued quickly today.  HR up to 148 with gait.  She remained in recliner after session.  While pt wanted to increase ambulation distance today she was unable due to fatigue and apologized for not doing better today.  Encouragement given.  Discussed again discharge plan.  Pt remains firm in her decision to return  home upon discharge and not consider SNF or CIR.  "I just want to go home".  I voiced concerns over her continued limited mobility and assistance level.  "I know".      Follow Up Recommendations  SNF     Equipment Recommendations  Rolling walker with 5" wheels;3in1 (PT);Wheelchair (measurements PT);Wheelchair cushion (measurements PT);Hospital bed;Other (comment)    Recommendations for Other Services  Pt will need ambulance transfer home at this time if mobility does not improve before discharge.     Precautions / Restrictions Precautions Precautions: Fall Precaution Comments: L IJ CVC; abdominal drain and incision sites Restrictions Weight Bearing Restrictions: No    Mobility  Bed Mobility Overal bed mobility: Needs Assistance                Transfers Overall transfer level: Needs assistance Equipment used: Rolling walker (2 wheeled) Transfers: Sit to/from Stand Sit to Stand: Mod assist;+2 physical assistance Stand pivot transfers: Min assist;+2 physical assistance;Min guard       General transfer comment: standing remains difficult for pt from varied surfaces due to edema in BLE and adbomen which limits her ability to flex at hips for power up.  Ambulation/Gait Ambulation/Gait assistance: Min assist;+2 physical assistance Gait Distance (Feet): 8 Feet Assistive device: Rolling walker (2 wheeled) Gait Pattern/deviations: Decreased step length - right;Decreased step length - left;Step-to pattern;Wide base of support Gait velocity: decreased   General Gait Details: pt with forward lean and excessive weight on walker today.  Verbal cues to stand up  fully and for walker position.   Stairs             Wheelchair Mobility    Modified Rankin (Stroke Patients Only)       Balance Overall balance assessment: Needs assistance Sitting-balance support: Feet supported;No upper extremity supported Sitting balance-Leahy Scale: Good Sitting balance - Comments:  steady sitting reaching within BOS  - limited by edema   Standing balance support: Single extremity supported Standing balance-Leahy Scale: Poor Standing balance comment: requires B UE support to maintain static standing balance.                            Cognition Arousal/Alertness: Awake/alert Behavior During Therapy: WFL for tasks assessed/performed Overall Cognitive Status: Within Functional Limits for tasks assessed                                        Exercises Other Exercises Other Exercises: Pt to wheelchair for family to go outside for a short time during session per her request.    General Comments        Pertinent Vitals/Pain Pain Assessment: Faces Faces Pain Scale: Hurts little more Pain Location: general discomfort  Pain Descriptors / Indicators: Aching;Grimacing;Constant    Home Living                      Prior Function            PT Goals (current goals can now be found in the care plan section) Progress towards PT goals: Progressing toward goals    Frequency    7X/week      PT Plan Current plan remains appropriate    Co-evaluation              AM-PAC PT "6 Clicks" Daily Activity  Outcome Measure  Difficulty turning over in bed (including adjusting bedclothes, sheets and blankets)?: Unable Difficulty moving from lying on back to sitting on the side of the bed? : Unable Difficulty sitting down on and standing up from a chair with arms (e.g., wheelchair, bedside commode, etc,.)?: Unable Help needed moving to and from a bed to chair (including a wheelchair)?: A Lot Help needed walking in hospital room?: A Lot Help needed climbing 3-5 steps with a railing? : Total 6 Click Score: 8    End of Session Equipment Utilized During Treatment: Gait belt Activity Tolerance: Patient limited by fatigue Patient left: in chair;with call bell/phone within reach;with family/visitor present Nurse Communication:  Patient requests pain meds       Time: 6213-0865 PT Time Calculation (min) (ACUTE ONLY): 34 min  Charges:  $Gait Training: 8-22 mins $Therapeutic Activity: 8-22 mins                    G Codes:       Danielle Dess, PTA 05/17/18, 3:34 PM

## 2018-05-18 ENCOUNTER — Encounter
Admission: RE | Disposition: A | Discharge status: Other Acute Inpatient Hospital | Payer: Self-pay | Source: Home / Self Care | Attending: Obstetrics and Gynecology

## 2018-05-18 ENCOUNTER — Inpatient Hospital Stay: Payer: BLUE CROSS/BLUE SHIELD | Admitting: Certified Registered Nurse Anesthetist

## 2018-05-18 ENCOUNTER — Encounter: Payer: Self-pay | Admitting: Certified Registered Nurse Anesthetist

## 2018-05-18 DIAGNOSIS — N179 Acute kidney failure, unspecified: Secondary | ICD-10-CM

## 2018-05-18 DIAGNOSIS — I82220 Acute embolism and thrombosis of inferior vena cava: Secondary | ICD-10-CM

## 2018-05-18 DIAGNOSIS — N1330 Unspecified hydronephrosis: Secondary | ICD-10-CM | POA: Diagnosis not present

## 2018-05-18 DIAGNOSIS — R600 Localized edema: Secondary | ICD-10-CM

## 2018-05-18 HISTORY — PX: CYSTOSCOPY W/ URETERAL STENT PLACEMENT: SHX1429

## 2018-05-18 HISTORY — PX: CYSTOSCOPY W/ RETROGRADES: SHX1426

## 2018-05-18 HISTORY — PX: CYSTOSCOPY WITH STENT PLACEMENT: SHX5790

## 2018-05-18 LAB — CBC
HEMATOCRIT: 26.8 % — AB (ref 35.0–47.0)
HEMOGLOBIN: 9.2 g/dL — AB (ref 12.0–16.0)
MCH: 31.7 pg (ref 26.0–34.0)
MCHC: 34.5 g/dL (ref 32.0–36.0)
MCV: 91.8 fL (ref 80.0–100.0)
Platelets: 290 10*3/uL (ref 150–440)
RBC: 2.92 MIL/uL — ABNORMAL LOW (ref 3.80–5.20)
RDW: 16.8 % — ABNORMAL HIGH (ref 11.5–14.5)
WBC: 8.5 10*3/uL (ref 3.6–11.0)

## 2018-05-18 LAB — TYPE AND SCREEN
ABO/RH(D): O POS
Antibody Screen: POSITIVE
Donor AG Type: NEGATIVE
Donor AG Type: NEGATIVE
UNIT DIVISION: 0
Unit division: 0
Unit division: 0
Unit division: 0

## 2018-05-18 LAB — BPAM RBC
BLOOD PRODUCT EXPIRATION DATE: 201907192359
BLOOD PRODUCT EXPIRATION DATE: 201907222359
Blood Product Expiration Date: 201907212359
Blood Product Expiration Date: 201907292359
ISSUE DATE / TIME: 201906260421
ISSUE DATE / TIME: 201906260037
UNIT TYPE AND RH: 5100
Unit Type and Rh: 5100
Unit Type and Rh: 5100
Unit Type and Rh: 5100

## 2018-05-18 LAB — COMPREHENSIVE METABOLIC PANEL
ALBUMIN: 2.3 g/dL — AB (ref 3.5–5.0)
ALK PHOS: 228 U/L — AB (ref 38–126)
ALT: <5 U/L (ref 0–44)
AST: 36 U/L (ref 15–41)
Anion gap: 12 (ref 5–15)
BILIRUBIN TOTAL: 0.6 mg/dL (ref 0.3–1.2)
BUN: 40 mg/dL — ABNORMAL HIGH (ref 6–20)
CALCIUM: 8 mg/dL — AB (ref 8.9–10.3)
CO2: 23 mmol/L (ref 22–32)
CREATININE: 3.22 mg/dL — AB (ref 0.44–1.00)
Chloride: 93 mmol/L — ABNORMAL LOW (ref 98–111)
GFR calc Af Amer: 19 mL/min — ABNORMAL LOW (ref >60)
GFR, EST NON AFRICAN AMERICAN: 17 mL/min — AB (ref >60)
GLUCOSE: 128 mg/dL — AB (ref 70–99)
Potassium: 3.8 mmol/L (ref 3.5–5.1)
Sodium: 128 mmol/L — ABNORMAL LOW (ref 135–145)
TOTAL PROTEIN: 6.5 g/dL (ref 6.5–8.1)

## 2018-05-18 LAB — PHOSPHORUS: Phosphorus: 4.6 mg/dL (ref 2.5–4.6)

## 2018-05-18 SURGERY — CYSTOSCOPY, FLEXIBLE, WITH STENT REPLACEMENT
Anesthesia: General | Site: Ureter | Laterality: Right | Wound class: "Clean Contaminated "

## 2018-05-18 MED ORDER — ONDANSETRON HCL 4 MG/2ML IJ SOLN
INTRAMUSCULAR | Status: AC
  Filled 2018-05-18: qty 2

## 2018-05-18 MED ORDER — PROPOFOL 10 MG/ML IV BOLUS
INTRAVENOUS | Status: DC | PRN
  Administered 2018-05-18: 180 mg via INTRAVENOUS

## 2018-05-18 MED ORDER — FENTANYL CITRATE (PF) 100 MCG/2ML IJ SOLN
INTRAMUSCULAR | Status: AC
  Filled 2018-05-18: qty 2

## 2018-05-18 MED ORDER — FENTANYL CITRATE (PF) 100 MCG/2ML IJ SOLN
25.0000 ug | INTRAMUSCULAR | Status: AC | PRN
  Administered 2018-05-18 (×6): 25 ug via INTRAVENOUS

## 2018-05-18 MED ORDER — HYDROMORPHONE HCL 1 MG/ML IJ SOLN
INTRAMUSCULAR | Status: AC
  Administered 2018-05-18: 0.5 mg via INTRAVENOUS
  Filled 2018-05-18: qty 1

## 2018-05-18 MED ORDER — MIDAZOLAM HCL 2 MG/2ML IJ SOLN
INTRAMUSCULAR | Status: AC
  Filled 2018-05-18: qty 2

## 2018-05-18 MED ORDER — FENTANYL CITRATE (PF) 100 MCG/2ML IJ SOLN
INTRAMUSCULAR | Status: AC
  Administered 2018-05-18: 25 ug via INTRAVENOUS
  Filled 2018-05-18: qty 2

## 2018-05-18 MED ORDER — MIDAZOLAM HCL 2 MG/2ML IJ SOLN
INTRAMUSCULAR | Status: DC | PRN
  Administered 2018-05-18: 2 mg via INTRAVENOUS

## 2018-05-18 MED ORDER — LACTATED RINGERS IV SOLN
INTRAVENOUS | Status: DC | PRN
  Administered 2018-05-18: 10:00:00 via INTRAVENOUS

## 2018-05-18 MED ORDER — BELLADONNA ALKALOIDS-OPIUM 16.2-60 MG RE SUPP
1.0000 | Freq: Every day | RECTAL | Status: DC
Start: 2018-05-18 — End: 2018-05-18
  Administered 2018-05-18: 1 via RECTAL

## 2018-05-18 MED ORDER — SUCCINYLCHOLINE CHLORIDE 20 MG/ML IJ SOLN
INTRAMUSCULAR | Status: AC
  Filled 2018-05-18: qty 1

## 2018-05-18 MED ORDER — PROPOFOL 10 MG/ML IV BOLUS
INTRAVENOUS | Status: AC
  Filled 2018-05-18: qty 20

## 2018-05-18 MED ORDER — LIDOCAINE HCL (CARDIAC) PF 100 MG/5ML IV SOSY
PREFILLED_SYRINGE | INTRAVENOUS | Status: DC | PRN
  Administered 2018-05-18: 100 mg via INTRAVENOUS

## 2018-05-18 MED ORDER — ONDANSETRON HCL 4 MG/2ML IJ SOLN
INTRAMUSCULAR | Status: DC | PRN
  Administered 2018-05-18: 4 mg via INTRAVENOUS

## 2018-05-18 MED ORDER — HYDROMORPHONE HCL 1 MG/ML IJ SOLN
0.5000 mg | INTRAMUSCULAR | Status: DC | PRN
  Administered 2018-05-18 (×2): 0.5 mg via INTRAVENOUS

## 2018-05-18 MED ORDER — DEXAMETHASONE SODIUM PHOSPHATE 10 MG/ML IJ SOLN
INTRAMUSCULAR | Status: AC
  Filled 2018-05-18: qty 1

## 2018-05-18 MED ORDER — BELLADONNA ALKALOIDS-OPIUM 16.2-60 MG RE SUPP
RECTAL | Status: AC
  Administered 2018-05-18: 1 via RECTAL
  Filled 2018-05-18: qty 1

## 2018-05-18 MED ORDER — IOTHALAMATE MEGLUMINE 43 % IV SOLN
INTRAVENOUS | Status: DC | PRN
  Administered 2018-05-18: 15 mL via URETHRAL

## 2018-05-18 MED ORDER — ONDANSETRON HCL 4 MG/2ML IJ SOLN: 4.0000 mg | Freq: Once | INTRAMUSCULAR | Status: DC | PRN

## 2018-05-18 MED ORDER — FENTANYL CITRATE (PF) 100 MCG/2ML IJ SOLN
INTRAMUSCULAR | Status: DC | PRN
  Administered 2018-05-18 (×2): 100 ug via INTRAVENOUS

## 2018-05-18 MED ORDER — LIDOCAINE HCL (PF) 2 % IJ SOLN
INTRAMUSCULAR | Status: AC
  Filled 2018-05-18: qty 10

## 2018-05-18 MED ORDER — DEXAMETHASONE SODIUM PHOSPHATE 10 MG/ML IJ SOLN
INTRAMUSCULAR | Status: DC | PRN
  Administered 2018-05-18: 10 mg via INTRAVENOUS

## 2018-05-18 MED ORDER — ROCURONIUM BROMIDE 100 MG/10ML IV SOLN
INTRAVENOUS | Status: DC | PRN
  Administered 2018-05-18: 5 mg via INTRAVENOUS

## 2018-05-18 MED ORDER — SUCCINYLCHOLINE CHLORIDE 20 MG/ML IJ SOLN
INTRAMUSCULAR | Status: DC | PRN
  Administered 2018-05-18: 140 mg via INTRAVENOUS

## 2018-05-18 SURGICAL SUPPLY — 23 items
BAG DRAIN CYSTO-URO LG1000N (MISCELLANEOUS) ×5 IMPLANT
BRUSH SCRUB EZ  4% CHG (MISCELLANEOUS)
BRUSH SCRUB EZ 4% CHG (MISCELLANEOUS) IMPLANT
CATH URETL 5X70 OPEN END (CATHETERS) ×5 IMPLANT
CONRAY 43 FOR UROLOGY 50M (MISCELLANEOUS) ×5 IMPLANT
GLIDEWIRE STR 0.035 150CM 3CM (WIRE) ×2 IMPLANT
GLOVE BIOGEL PI IND STRL 8 (GLOVE) ×3 IMPLANT
GLOVE BIOGEL PI INDICATOR 8 (GLOVE) ×2
GOWN STANDARD XL  REUSABL (MISCELLANEOUS) ×5 IMPLANT
KIT TURNOVER CYSTO (KITS) ×5 IMPLANT
PACK CYSTO AR (MISCELLANEOUS) ×5 IMPLANT
SENSORWIRE 0.038 NOT ANGLED (WIRE) ×5
SET CYSTO W/LG BORE CLAMP LF (SET/KITS/TRAYS/PACK) ×5 IMPLANT
SOL .9 NS 3000ML IRR  AL (IV SOLUTION) ×6
SOL .9 NS 3000ML IRR UROMATIC (IV SOLUTION) ×3 IMPLANT
STENT URET 6FRX24 CONTOUR (STENTS) IMPLANT
STENT URET 6FRX26 CONTOUR (STENTS) IMPLANT
STENT URO INLAY 6FRX22CM (STENTS) ×2 IMPLANT
STENT URO INLAY 6FRX24CM (STENTS) ×2 IMPLANT
SURGILUBE 2OZ TUBE FLIPTOP (MISCELLANEOUS) ×5 IMPLANT
SYRINGE IRR TOOMEY STRL 70CC (SYRINGE) ×3 IMPLANT
WATER STERILE IRR 1000ML POUR (IV SOLUTION) ×5 IMPLANT
WIRE SENSOR 0.038 NOT ANGLED (WIRE) ×3 IMPLANT

## 2018-05-18 NOTE — Progress Notes (Signed)
 Vein and Vascular Surgery  Daily Progress Note   Subjective  - Day of Surgery  Patient had successful ureteral stent placement today.  She is not having the intense pain in her legs that she did in the past.  Objective Vitals:   05/18/18 1300 05/18/18 1330 05/18/18 1350 05/18/18 1445  BP: 138/81 122/88 (!) 142/82 134/76  Pulse: (!) 111  (!) 108 (!) 107  Resp: 13     Temp:   97.7 F (36.5 C) 97.7 F (36.5 C)  TempSrc:   Oral Oral  SpO2: 98%   99%  Weight:      Height:        Intake/Output Summary (Last 24 hours) at 05/18/2018 1926 Last data filed at 05/18/2018 1120 Gross per 24 hour  Intake 400 ml  Output -  Net 400 ml    PULM  Normal effort , no use of accessory muscles CV  No JVD, RRR Abd      No distended, nontender VASC  both lower extremities are still massively swollen but they are softer to the toes are pink with less than 1 second capillary refill.    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 Her hemoglobin remains stable so anticoagulation may be possible and I will consider this next week. She is status post ureteral stent placement today hopefully her creatinine will start to decrease.  It was elevated this morning compared with yesterday     Levora DredgeGregory Burnell Matlin  05/18/2018, 7:26  PM

## 2018-05-18 NOTE — Progress Notes (Signed)
[facility]  ROUNDING NOTE   Subjective:   Family at bedside.   Cystoscopy and right stent placed, left stent exchanged.   Objective:  Vital signs in last 24 hours:  Temp:  [97.4 F (36.3 C)-98.2 F (36.8 C)] 97.7 F (36.5 C) (06/28 1445) Pulse Rate:  [107-124] 107 (06/28 1445) Resp:  [11-20] 13 (06/28 1300) BP: (96-142)/(60-92) 134/76 (06/28 1445) SpO2:  [94 %-100 %] 99 % (06/28 1445) Weight:  [132 kg (291 lb)-132.1 kg (291 lb 3.6 oz)] 132 kg (291 lb) (06/28 0950)  Weight change:  Filed Weights   05/03/18 0800 05/18/18 0509 05/18/18 0950  Weight: 93 kg (205 lb) 132.1 kg (291 lb 3.6 oz) 132 kg (291 lb)    Intake/Output: I/O last 3 completed shifts: In: 240 [P.O.:240] Out: -    Intake/Output this shift:  Total I/O In: 400 [I.V.:400] Out: -   Physical Exam: General: NAD,   Head: Normocephalic, atraumatic. Moist oral mucosal membranes  Eyes: Anicteric, PERRL  Neck: Supple, trachea midline  Lungs:  Clear to auscultation  Heart: Regular rate and rhythm  Abdomen:  Soft, nontender  Extremities: 1+ peripheral edema.  Neurologic: Nonfocal, moving all four extremities  Skin: No lesions        Basic Metabolic Panel: Recent Labs  Lab 05/14/18 0554 05/15/18 0500 05/15/18 0611 05/16/18 0741 05/17/18 0512 05/18/18 0522  NA 128* 128*  --  128* 126*  128* 128*  K 3.3* 3.4*  --  4.0 3.9  4.0 3.8  CL 93* 93*  --  93* 91*  93* 93*  CO2 25 25  --  24 23  23 23   GLUCOSE 122* 113*  --  123* 120*  123* 128*  BUN 22* 24*  --  33* 36*  37* 40*  CREATININE 0.95 1.37*  --  2.30* 2.97*  2.96* 3.22*  CALCIUM 7.9* 7.8*  --  7.8* 7.7*  7.8* 8.0*  MG  --   --  1.6*  --   --   --   PHOS  --   --  3.0 3.7 3.9 4.6    Liver Function Tests: Recent Labs  Lab 05/13/18 0513 05/14/18 0554 05/15/18 0500 05/16/18 0741 05/17/18 0512 05/18/18 0522  AST 57* 52* 53*  --  45* 36  ALT 25 20 14   --  5 <5  ALKPHOS 236* 215* 254*  --  253* 228*  BILITOT 0.7 0.7  0.7  --  0.8 0.6  PROT 6.0* 6.2* 6.5  --  6.3* 6.5  ALBUMIN 2.5* 2.3* 2.6* 2.3* 2.2*  2.3* 2.3*   No results for input(s): LIPASE, AMYLASE in the last 168 hours. No results for input(s): AMMONIA in the last 168 hours.  CBC: Recent Labs  Lab 05/13/18 0513 05/14/18 0554 05/15/18 0500 05/16/18 0739 05/17/18 0512 05/18/18 0522  WBC 14.9* 12.6* 11.1*  --  11.5* 8.5  HGB 8.3* 7.7* 7.6* 9.5* 9.4* 9.2*  HCT 24.5* 22.5* 21.9* 27.1* 27.2* 26.8*  MCV 91.4 91.0 91.0  --  91.4 91.8  PLT 294 305 303  --  274 290    Cardiac Enzymes: No results for input(s): CKTOTAL, CKMB, CKMBINDEX, TROPONINI in the last 168 hours.  BNP: Invalid input(s): POCBNP  CBG: Recent Labs  Lab 05/13/18 1110  GLUCAP 145*    Microbiology: Results for orders placed or performed during the hospital encounter of 04/27/18  MRSA PCR Screening     Status: None   Collection Time: 04/27/18 10:27 PM  Result  Value Ref Range Status   MRSA by PCR NEGATIVE NEGATIVE Final    Comment:        The GeneXpert MRSA Assay (FDA approved for NASAL specimens only), is one component of a comprehensive MRSA colonization surveillance program. It is not intended to diagnose MRSA infection nor to guide or monitor treatment for MRSA infections. Performed at [facility], [address] [place], [place] [address]   CULTURE, BLOOD (ROUTINE X 2) w Reflex to ID Panel     Status: None   Collection Time: 04/28/18  9:24 PM  Result Value Ref Range Status   Specimen Description BLOOD RIGHT HAND  Final   Special Requests   Final    BOTTLES DRAWN AEROBIC AND ANAEROBIC Blood Culture adequate volume   Culture   Final    NO GROWTH 5 DAYS Performed at [facility], [address] [place], [place] [address]    Report Status 05/03/2018 FINAL  Final  CULTURE, BLOOD (ROUTINE X 2) w Reflex to ID Panel     Status: None   Collection Time: 04/28/18  9:44 PM  Result Value Ref Range Status   Specimen Description BLOOD  RIGHT ANTECUBITAL  Final   Special Requests   Final    BOTTLES DRAWN AEROBIC AND ANAEROBIC Blood Culture adequate volume   Culture   Final    NO GROWTH 5 DAYS Performed at [facility], [address] [place], [place] [address]    Report Status 05/03/2018 FINAL  Final  Urine Culture     Status: None   Collection Time: 05/09/18  4:30 PM  Result Value Ref Range Status   Specimen Description   Final    URINE, CATHETERIZED Performed at [facility], [address] [place], [place] [address]    Special Requests   Final    Normal Performed at [facility], [address] [place], [place] [address]    Culture   Final    NO GROWTH Performed at [facility], [address] [place], [place] [address]    Report Status 05/10/2018 FINAL  Final  CULTURE, BLOOD (ROUTINE X 2) w Reflex to ID Panel     Status: Abnormal   Collection Time: 05/09/18  6:02 PM  Result Value Ref Range Status   Specimen Description   Final    BLOOD LEFT HAND Performed at [facility], [address] [place], [place] [address]    Special Requests   Final    BOTTLES DRAWN AEROBIC AND ANAEROBIC Blood Culture adequate volume Performed at [facility], [address] [place], [place] [address]    Culture  Setup Time   Final    AEROBIC BOTTLE ONLY GRAM POSITIVE COCCI CRITICAL RESULT CALLED TO, READ BACK BY AND VERIFIED WITH: LISA KLUTTZ 05/10/18 1433 KLW    Culture (A)  Final    STAPHYLOCOCCUS SPECIES (COAGULASE NEGATIVE) THE SIGNIFICANCE OF ISOLATING THIS ORGANISM FROM A SINGLE SET OF BLOOD CULTURES WHEN MULTIPLE SETS ARE DRAWN IS UNCERTAIN. PLEASE NOTIFY THE MICROBIOLOGY DEPARTMENT WITHIN ONE WEEK IF SPECIATION AND SENSITIVITIES ARE REQUIRED. Performed at [facility], [address] [place], [place] [address]    Report Status 05/12/2018 FINAL  Final  Blood Culture ID Panel (Reflexed)     Status: Abnormal   Collection Time: 05/09/18  6:02 PM   Result Value Ref Range Status   Enterococcus species NOT DETECTED NOT DETECTED Final   Listeria monocytogenes NOT DETECTED NOT  DETECTED Final   Staphylococcus species DETECTED (A) NOT DETECTED Final    Comment: Methicillin (oxacillin) susceptible coagulase negative staphylococcus. Possible blood culture contaminant (unless isolated from more than one blood culture draw or clinical case suggests pathogenicity). No antibiotic treatment is indicated for blood  culture contaminants. CRITICAL RESULT CALLED TO, READ BACK BY AND VERIFIED WITH: LISA KLUTTZ 05/10/18 1433 KLW    Staphylococcus aureus NOT DETECTED NOT DETECTED Final   Methicillin resistance NOT DETECTED NOT DETECTED Final   Streptococcus species NOT DETECTED NOT DETECTED Final   Streptococcus agalactiae NOT DETECTED NOT DETECTED Final   Streptococcus pneumoniae NOT DETECTED NOT DETECTED Final   Streptococcus pyogenes NOT DETECTED NOT DETECTED Final   Acinetobacter baumannii NOT DETECTED NOT DETECTED Final   Enterobacteriaceae species NOT DETECTED NOT DETECTED Final   Enterobacter cloacae complex NOT DETECTED NOT DETECTED Final   Escherichia coli NOT DETECTED NOT DETECTED Final   Klebsiella oxytoca NOT DETECTED NOT DETECTED Final   Klebsiella pneumoniae NOT DETECTED NOT DETECTED Final   Proteus species NOT DETECTED NOT DETECTED Final   Serratia marcescens NOT DETECTED NOT DETECTED Final   Haemophilus influenzae NOT DETECTED NOT DETECTED Final   Neisseria meningitidis NOT DETECTED NOT DETECTED Final   Pseudomonas aeruginosa NOT DETECTED NOT DETECTED Final   Candida albicans NOT DETECTED NOT DETECTED Final   Candida glabrata NOT DETECTED NOT DETECTED Final   Candida krusei NOT DETECTED NOT DETECTED Final   Candida parapsilosis NOT DETECTED NOT DETECTED Final   Candida tropicalis NOT DETECTED NOT DETECTED Final    Comment: Performed at [facility], [address] [place], [place] [address]  CULTURE, BLOOD (ROUTINE X 2)  w Reflex to ID Panel     Status: None   Collection Time: 05/09/18  7:00 PM  Result Value Ref Range Status   Specimen Description BLOOD RIGHT HAND  Final   Special Requests   Final    BOTTLES DRAWN AEROBIC AND ANAEROBIC Blood Culture adequate volume   Culture   Final    NO GROWTH 5 DAYS Performed at [facility], [address] [place], [place] [address]    Report Status 05/14/2018 FINAL  Final  CULTURE, BLOOD (ROUTINE X 2) w Reflex to ID Panel     Status: None   Collection Time: 05/10/18  7:10 PM  Result Value Ref Range Status   Specimen Description BLOOD LINE DRAW PER MD  Final   Special Requests   Final    BOTTLES DRAWN AEROBIC AND ANAEROBIC Blood Culture results may not be optimal due to an excessive volume of blood received in culture bottles   Culture   Final    NO GROWTH 5 DAYS Performed at [facility], [address] [place], [place] [address]    Report Status 05/15/2018 FINAL  Final    Coagulation Studies: No results for input(s): LABPROT, INR in the last 72 hours.  Urinalysis: No results for input(s): COLORURINE, LABSPEC, PHURINE, GLUCOSEU, HGBUR, BILIRUBINUR, KETONESUR, PROTEINUR, UROBILINOGEN, NITRITE, LEUKOCYTESUR in the last 72 hours.  Invalid input(s): APPERANCEUR    Imaging: No results found.   Medications:    . sodium chloride   Intravenous Once  . docusate sodium  100 mg Oral BID  . feeding supplement (ENSURE ENLIVE)  237 mL Oral TID BM  . furosemide  20 mg Oral Daily  . mirabegron ER  25 mg Oral Daily  . mirtazapine  15 mg Oral QHS  . multivitamin with minerals  1 tablet Oral  Daily  . nystatin  5 mL Oral QID  . pantoprazole  40 mg Oral Daily  . polyethylene glycol  17 g Oral Daily  . sodium chloride flush  10-40 mL Intracatheter Q12H   ALPRAZolam, calcium carbonate, fluticasone, HYDROmorphone (DILAUDID) injection, lidocaine, Melatonin, menthol-cetylpyridinium, [DISCONTINUED] ondansetron **OR** ondansetron (ZOFRAN) IV,  opium-belladonna, oxyCODONE-acetaminophen, phenazopyridine, phenol, promethazine, sodium chloride flush  Assessment/ Plan:  Ms. [name] is a 42 y.o. whtie female  post op abdominal hysterectomy, left salpingo-oophorectomy, laparoscopic lysis of adhesions and retroperitoneal dissection on 04/28/2018. Followed by cystoscopy with retrograde pyelogram and left ureteral stent placement on 04/29/2018 Baseline creatinine is 0.79 from 04/19/2018  1. Acute renal failure: secondary to ATN and obstructive uropathy. Creatinine has now rising. Most likely due to IV contrast exposure on 6/21.  - Appreciate urology input, cystoscopy with right stent placement and left stent replacement on 6/28 by Dr. Lonna Cobb - Continue furosemide 20mg  PO daily  2. Generalized edema/Anasarca: with albumin of 2.3 - Encourage PO intake.  - Low threshold for diuretics as above.  - Discussed IV albumin with patient   3.  Hyponatremia.  Na 128 - most likely secondary to anasarca/volume status. Improving.  - encourage PO intake.  - PO furosemide  4.  Anemia from blood loss: status post PRBC transfusion 6/25 and 6/26   LOS: 21 [name] 6/28/20195:17 PM

## 2018-05-18 NOTE — Interval H&P Note (Signed)
History and Physical Interval Note:  05/18/2018 9:38 AM  [name]  has presented today for surgery, with the diagnosis of n/a  The various methods of treatment have been discussed with the patient and family. After consideration of risks, benefits and other options for treatment, the patient has consented to  Procedure(s): CYSTOSCOPY WITH STENT PLACEMENT (Right) CYSTOSCOPY WITH STENT REPLACEMENT (Left) as a surgical intervention .  The patient's history has been reviewed, patient examined, no change in status, stable for surgery.  I have reviewed the patient's chart and labs.  Questions were answered to the patient's satisfaction.     [name] C Grover [place]

## 2018-05-18 NOTE — Anesthesia Postprocedure Evaluation (Signed)
Anesthesia Post Note  Patient: [name]  Procedure(s) Performed: CYSTOSCOPY WITH STENT REPLACEMENT (Left Ureter) CYSTOSCOPY WITH RETROGRADE PYELOGRAM (Bilateral Ureter) CYSTOSCOPY WITH STENT PLACEMENT (Right Ureter)  Patient location during evaluation: PACU Anesthesia Type: General Level of consciousness: awake and alert Pain management: pain level controlled Vital Signs Assessment: post-procedure vital signs reviewed and stable Respiratory status: spontaneous breathing and respiratory function stable Cardiovascular status: stable Anesthetic complications: no     Last Vitals:  Vitals:   05/18/18 1232 05/18/18 1247  BP: 110/75 134/79  Pulse: (!) 113 (!) 112  Resp: 17 11  Temp:    SpO2: 98% 99%    Last Pain:  Vitals:   05/18/18 1232  TempSrc:   PainSc: 6                  Ayauna Mcnay K

## 2018-05-18 NOTE — Progress Notes (Signed)
OT Cancellation Note  Patient Details Name: Jonelle SidleGinger B Konczal MRN: [id number] DOB: 06/25/1976   Cancelled Treatment:    Reason Eval/Treat Not Completed: Patient at procedure or test/ unavailable. Pt out of the room for surgery this am (cystoscopy with stent placement on R and replacement on L, per urology). If pt goes under general anesthesia, will need Continue at Transfer orders to continue OT services. Will re-attempt at later date/time as appropriate.   Richrd PrimeJamie Stiller, MPH, MS, OTR/L ascom [phone] 05/18/18, 9:58 AM

## 2018-05-18 NOTE — Care Management (Signed)
Patient status post 6/28 1. Cystoscopy 2. Right ureteral stent placement (6 Fr) 24 cm 3. Exchange left ureteral stent (6Fr) 22 cm 4. Bilateral retrograde pyelography with interpretation    RNCM spoke with husband Roger ShelterGordon via phone and confirmed that RW, shower, and lift chair is in the home.  At this time anticipate Hospital bed, hoyerlift, BSC, and WC needed at discharge.  Orders have been placed for equipment. Husband states that he would like to wait till closer to discharge to agreeing to what eqipment will be delivered at the home.

## 2018-05-18 NOTE — Transfer of Care (Signed)
Immediate Anesthesia Transfer of Care Note  Patient: [name]  Procedure(s) Performed: CYSTOSCOPY WITH STENT REPLACEMENT (Left Ureter) CYSTOSCOPY WITH RETROGRADE PYELOGRAM (Bilateral Ureter) CYSTOSCOPY WITH STENT PLACEMENT (Right Ureter)  Patient Location: PACU  Anesthesia Type:General  Level of Consciousness: drowsy  Airway & Oxygen Therapy: Patient Spontanous Breathing and Patient connected to face mask oxygen  Post-op Assessment: Report given to RN and Post -op Vital signs reviewed and stable  Post vital signs: Reviewed and stable  Last Vitals:  Vitals Value Taken Time  BP 137/78 05/18/2018 11:32 AM  Temp    Pulse 112 05/18/2018 11:36 AM  Resp 20 05/18/2018 11:36 AM  SpO2 99 % 05/18/2018 11:36 AM  Vitals shown include unvalidated device data.  Last Pain:  Vitals:   05/18/18 0946  TempSrc: Tympanic  PainSc: 3       Patients Stated Pain Goal: 1 (05/16/18 1902)  Complications: No apparent anesthesia complications

## 2018-05-18 NOTE — Anesthesia Post-op Follow-up Note (Signed)
Anesthesia QCDR form completed.        

## 2018-05-18 NOTE — Consult Note (Signed)
[employer] - [place] [facility]   PATIENT NAME: [name]    MR#:  [id number]  DATE OF BIRTH:  1976/11/10  DATE OF ADMISSION:  04/27/2018  PRIMARY CARE PHYSICIAN: Gracelyn Nurse, MD   CONSULT REQUESTING/REFERRING PHYSICIAN: Dr. Dalbert Garnet  REASON FOR CONSULT: AKI, Edema  CHIEF COMPLAINT:  No chief complaint on file.   HISTORY OF PRESENT ILLNESS:  [name]  is a 42 y.o. female with a known history of GERD, DVT was admitted to the hospital after hysterectomy on April 27, 2018.  This had to be converted to open laparotomy and left ovary had to be resected.  She did develop significant hematuria and urological obstruction after the procedure.  Had ureteral stents placed.  Had acute kidney injury.  Developed significant amount of anasarca.  Most troubling being the lower committee edema causing inability to walk.  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  Vascular surgery has been following the patient. Patient had a ureteral stent exchange today.  Presently she is being diuresed with Lasix.  Albumin is being considered.  Nephrology, urology and vascular surgery on board helping out Dr. Dalbert Garnet. Hospitalist team consulted for her hyponatremia, acute kidney injury and anasarca.  Patient is wanting to go home with home health services.  Recommendation from the care team has been skilled nursing facility.  She does not qualify for inpatient rehab.  Attempt made to transfer today but refused by [facility].  PAST MEDICAL HISTORY:   Past Medical History:  Diagnosis Date  . DVT (deep venous thrombosis) (HCC) 1992   3 blood clots after pregnancy (one in each leg and one in abdomen)  . GERD (gastroesophageal reflux disease)     PAST SURGICAL HISTOIRY:   Past Surgical History:  Procedure Laterality Date  . CESAREAN SECTION  2000  . CYSTOSCOPY N/A 04/27/2018   Procedure: CYSTOSCOPY;  Surgeon: Christeen Douglas, MD;  Location: [facility] ORS;  Service:  Gynecology;  Laterality: N/A;  . CYSTOSCOPY W/ URETERAL STENT PLACEMENT Bilateral 04/28/2018   Procedure: CYSTOSCOPY WITH RETROGRADE PYELOGRAM/Left URETERAL STENT PLACEMENT, ureteroscopy;  Surgeon: Crista Elliot, MD;  Location: [facility] ORS;  Service: Urology;  Laterality: Bilateral;  . IR NEPHROSTOGRAM RIGHT THRU EXISTING ACCESS  05/10/2018  . IR NEPHROSTOMY PLACEMENT RIGHT  04/30/2018  . LAPAROSCOPIC LYSIS OF ADHESIONS  04/27/2018   Procedure: LAPAROSCOPIC LYSIS OF ADHESIONS, RETROPERITONEAL DISSECTION;  Surgeon: Christeen Douglas, MD;  Location: [facility] ORS;  Service: Gynecology;;  . SALPINGOOPHORECTOMY Left 04/27/2018   Procedure: SALPINGO OOPHORECTOMY;  Surgeon: Christeen Douglas, MD;  Location: [facility] ORS;  Service: Gynecology;  Laterality: Left;  . SUPRACERVICAL ABDOMINAL HYSTERECTOMY N/A 04/27/2018   Procedure: ATTEMPTED LAPAROSCOPIC HYSTERECTOMY CONVERTED TO SUPRACERVICAL ABDOMINAL HYSTERECTOMY;  Surgeon: Christeen Douglas, MD;  Location: [facility] ORS;  Service: Gynecology;  Laterality: N/A;  . UNILATERAL SALPINGECTOMY Right 04/27/2018   Procedure: UNILATERAL SALPINGECTOMY;  Surgeon: Christeen Douglas, MD;  Location: [facility] ORS;  Service: Gynecology;  Laterality: Right;  . VENOUS THROMBECTOMY  1992    SOCIAL HISTORY:   Social History   Tobacco Use  . Smoking status: Never Smoker  . Smokeless tobacco: Never Used  Substance Use Topics  . Alcohol use: Yes    Comment: occassional    FAMILY HISTORY:   Family History  Problem Relation Age of Onset  . Diabetes Mother   . Hypertension Mother   . Addison's disease Mother     DRUG ALLERGIES:  No Known Allergies  REVIEW OF  SYSTEMS:   Review of Systems  Constitutional: Positive for malaise/fatigue. Negative for chills and fever.  HENT: Negative for sore throat.   Eyes: Negative for blurred vision, double vision and pain.  Respiratory: Negative for cough, hemoptysis, shortness of breath and wheezing.   Cardiovascular: Positive for leg swelling.  Negative for chest pain, palpitations and orthopnea.  Gastrointestinal: Positive for abdominal pain. Negative for constipation, diarrhea, heartburn, nausea and vomiting.  Genitourinary: Negative for dysuria and hematuria.  Musculoskeletal: Negative for back pain and joint pain.  Skin: Negative for rash.  Neurological: Negative for sensory change, speech change, focal weakness and headaches.  Endo/Heme/Allergies: Does not bruise/bleed easily.  Psychiatric/Behavioral: Positive for depression. The patient is not nervous/anxious.    MEDICATIONS AT HOME:   Prior to Admission medications   Medication Sig Start Date End Date Taking? Authorizing Provider  acetaminophen (TYLENOL) 325 MG tablet Take 650 mg by mouth every 6 (six) hours as needed.    [provider]  ibuprofen (ADVIL,MOTRIN) 200 MG tablet Take 400 mg by mouth every 6 (six) hours as needed.    [provider]      VITAL SIGNS:  Blood pressure 134/76, pulse (!) 107, temperature 97.7 F (36.5 C), temperature source Oral, resp. rate 13, height 5\' 5"  (1.651 m), weight 132 kg (291 lb), last menstrual period 04/22/2018, SpO2 99 %.  PHYSICAL EXAMINATION:  GENERAL:  42 y.o.-year-old patient lying in the bed with no acute distress.  Obese EYES: Pupils equal, round, reactive to light and accommodation. No scleral icterus. Extraocular muscles intact.  HEENT: Head atraumatic, normocephalic. Oropharynx and nasopharynx clear.  NECK:  Supple, no jugular venous distention. No thyroid enlargement, no tenderness.  LUNGS: Normal breath sounds bilaterally, no wheezing, rales,rhonchi or crepitation. No use of accessory muscles of respiration.  CARDIOVASCULAR: S1, S2 normal. No murmurs, rubs, or gallops.  ABDOMEN: Soft. Bowel sounds present. No organomegaly or mass.  EXTREMITIES: Significant lower extremity edema along with anasarca NEUROLOGIC: Cranial nerves II through XII are intact. Muscle strength 5/5 in upper extremities.   Decreased symmetrically due to edema in lower extremities PSYCHIATRIC: The patient is alert and oriented x 3.  SKIN: No obvious rash, lesion, or ulcer.   LABORATORY PANEL:   CBC Recent Labs  Lab 05/18/18 0522  WBC 8.5  HGB 9.2*  HCT 26.8*  PLT 290   ------------------------------------------------------------------------------------------------------------------  Chemistries  Recent Labs  Lab 05/15/18 0611  05/18/18 0522  NA  --    < > 128*  K  --    < > 3.8  CL  --    < > 93*  CO2  --    < > 23  GLUCOSE  --    < > 128*  BUN  --    < > 40*  CREATININE  --    < > 3.22*  CALCIUM  --    < > 8.0*  MG 1.6*  --   --   AST  --    < > 36  ALT  --    < > <5  ALKPHOS  --    < > 228*  BILITOT  --    < > 0.6   < > = values in this interval not displayed.   ------------------------------------------------------------------------------------------------------------------  Cardiac Enzymes No results for input(s): TROPONINI in the last 168 hours. ------------------------------------------------------------------------------------------------------------------  RADIOLOGY:  No results found.  EKG:   Orders placed or performed during the hospital encounter of 04/27/18  . EKG 12-Lead  .  EKG 12-Lead    IMPRESSION AND PLAN:   * Anasarca is multifactorial due to hypoalbuminemia, IVC thrombus, decreased mobility and recent surgery. Patient diuresing with Lasix.  Nephrology following.  Compression seem to be helping. Encourage patient to decrease sodium in her diet. We will have a dietitian consult.  * Acute kidney injury.  Slowly improving.  Nephrology following.  Monitor while being diuresed.  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  Vascular surgery following  At this time patient is close to being discharged home.  Will discuss with nephrology in the morning regarding inpatient versus outpatient monitoring. Hopefully patient can be discharge home with home health in 1-2  days.  All the records are reviewed and case discussed with Consulting provider. Management plans discussed with the patient, family and they are in agreement.  CODE STATUS: Full code  TOTAL TIME TAKING CARE OF THIS PATIENT: 35 minutes.    Molinda BailiffSrikar R Vylet Maffia M.D on 05/18/2018 at 5:29 PM  Between 7am to 6pm - Pager - [phone]  After 6pm go to [email or web address] - password [email or web address] [facility]  [facility] [place] [facility]  Office  [phone]  CC: Primary care Physician: Gracelyn NurseJohnston, John D, MD     Note: This dictation was prepared with Dragon dictation along with smaller phrase technology. Any transcriptional errors that result from this process are unintentional.

## 2018-05-18 NOTE — Progress Notes (Signed)
Patient ID: [name], female   DOB: 10/16/1976, 42 y.o.   MRN: [id number] Day of Surgery  Procedure(s):  1. 04/28/18: ATTEMPTED LAPAROSCOPIC HYSTERECTOMY CONVERTED TO SUPRACERVICAL ABDOMINAL HYSTERECTOMY (N/A) SALPINGO OOPHORECTOMY (Left) CYSTOSCOPY (N/A) UNILATERAL SALPINGECTOMY (Right) LAPAROSCOPIC LYSIS OF ADHESIONS, RETROPERITONEAL DISSECTION  2. 04/29/18: CYSTOSCOPY WITH RETROGRADE PYELOGRAM/Left URETERAL STENT PLACEMENT, ureteroscopy (Bilateral)    Subjective: Pt to surgery with urology today, right ureteral stent placed and left stent exchanged.  Working with nutrition to improve protein status  Skin breakdown along dependent portions of torso noted and covered.  Working with PT/OT  Objective: Vital signs in last 24 hours: Temp:  [97.4 F (36.3 C)-98.3 F (36.8 C)] 97.5 F (36.4 C) (06/28 1132) Pulse Rate:  [77-124] 111 (06/28 1300) Resp:  [11-20] 13 (06/28 1300) BP: (96-138)/(60-92) 122/88 (06/28 1330) SpO2:  [94 %-100 %] 98 % (06/28 1300) Weight:  [132 kg (291 lb)-132.1 kg (291 lb 3.6 oz)] 132 kg (291 lb) (06/28 0950)  Intake/Output  Intake/Output Summary (Last 24 hours) at 05/18/2018 1337 Last data filed at 05/18/2018 1120 Gross per 24 hour  Intake 400 ml  Output -  Net 400 ml    Physical Exam:  General: Oriented and responsive, appears more cheerful GI: Non-tender, non-distended. Normal bowel sounds. Ecchymosis over mons improving, and lap incisions clean and dry. Pitting edema to 3cm above umbilicus.  Incisions: Clean and dry, steris intact Urine: Clear Extremities: Nontender, no erythema, LE wrapped.   Lab Results: Recent Labs    05/16/18 0739 05/17/18 0512 05/18/18 0522  HGB 9.5* 9.4* 9.2*  HCT 27.1* 27.2* 26.8*  WBC  --  11.5* 8.5  PLT  --  274 290                 Results for orders placed or performed during the hospital encounter of 04/27/18 (from the past 24 hour(s))  CBC     Status: Abnormal   Collection Time: 05/18/18  5:22 AM   Result Value Ref Range   WBC 8.5 3.6 - 11.0 K/uL   RBC 2.92 (L) 3.80 - 5.20 MIL/uL   Hemoglobin 9.2 (L) 12.0 - 16.0 g/dL   HCT 13.226.8 (L) 44.035.0 - 10.247.0 %   MCV 91.8 80.0 - 100.0 fL   MCH 31.7 26.0 - 34.0 pg   MCHC 34.5 32.0 - 36.0 g/dL   RDW 72.516.8 (H) 36.611.5 - 44.014.5 %   Platelets 290 150 - 440 K/uL  Comprehensive metabolic panel     Status: Abnormal   Collection Time: 05/18/18  5:22 AM  Result Value Ref Range   Sodium 128 (L) 135 - 145 mmol/L   Potassium 3.8 3.5 - 5.1 mmol/L   Chloride 93 (L) 98 - 111 mmol/L   CO2 23 22 - 32 mmol/L   Glucose, Bld 128 (H) 70 - 99 mg/dL   BUN 40 (H) 6 - 20 mg/dL   Creatinine, Ser 3.473.22 (H) 0.44 - 1.00 mg/dL   Calcium 8.0 (L) 8.9 - 10.3 mg/dL   Total Protein 6.5 6.5 - 8.1 g/dL   Albumin 2.3 (L) 3.5 - 5.0 g/dL   AST 36 15 - 41 U/L   ALT <5 0 - 44 U/L   Alkaline Phosphatase 228 (H) 38 - 126 U/L   Total Bilirubin 0.6 0.3 - 1.2 mg/dL   GFR calc non Af Amer 17 (L) >60 mL/min   GFR calc Af Amer 19 (L) >60 mL/min   Anion gap 12 5 - 15  Phosphorus  Status: None   Collection Time: 05/18/18  5:22 AM  Result Value Ref Range   Phosphorus 4.6 2.5 - 4.6 mg/dL    Assessment/Plan: Day of Surgery        Procedure(s): 1. 04/28/18: ATTEMPTED LAPAROSCOPIC HYSTERECTOMY CONVERTED TO SUPRACERVICAL ABDOMINAL HYSTERECTOMY (N/A) SALPINGO OOPHORECTOMY (Left) CYSTOSCOPY (N/A) UNILATERAL SALPINGECTOMY (Right) LAPAROSCOPIC LYSIS OF ADHESIONS, RETROPERITONEAL DISSECTION   2. 04/29/18: CYSTOSCOPY WITH RETROGRADE PYELOGRAM/Left URETERAL STENT PLACEMENT, ureteroscopy (Bilateral)  IMP: Generalized edema Anemia  PLAN/REC:  - Discussion with patient and family re: consider transfer at their request. This is reasonable, given her multiple issues. I would consider the hospitalist service with consult to vascular, nephrology and urology. I'  - Anemia: s/p repeat transfusion, H/H stable today at 9.2 (9.4, 9.5). S/p ppx heparin 5000u TID x18 days.  - Vascular surgery -  appreciate their recs for long term management.   - Urology for management of hematuria, hydro. Renal scan today with bilateral moderate hydronephrosis. F/u cystoscopy. ON po lasix daily now.  - Nephrology following, monitoring creatinine as well.  - Continuing to need pain meds for management of movement. Discussed need for titration off prior to discharge. Continue transition to po pain meds.  - Nutrition following to improve status. Albumin still around 2 and not rising. Recommend supplementation to 100g protein daily.  - Of note, she does seem more mentally not quite as sharp as at the admission. She is picking her nose constantly during discussions and seems to attend well but without necessarily paying attention to it. It may be related to anxiety/depression/acute response to hospitalization. She did see psychiatry >1 week ago, who gave support but no recommendations. Will monitor for any other mental changes. Her Na today is low but unchanged at 128.   Christeen Douglas, MD Cell phone: [phone]   LOS: 21 days   Christeen Douglas 05/18/2018, 1:37 PMPatient ID: [name], female   DOB: [birth year], 42 y.o.   MRN: [id number] Patient ID: [name], female   DOB: [birth year], 42 y.o.   MRN: [id number]

## 2018-05-18 NOTE — Op Note (Signed)
Preoperative diagnosis:  1. Bilateral hydronephrosis with acute renal failure  Postoperative diagnosis:  1. Bilateral hydronephrosis with acute renal failure  Procedure:  1. Cystoscopy 2. Right ureteral stent placement (6 Fr) 24 cm 3. Exchange left ureteral stent (6Fr) 22 cm 4. Bilateral retrograde pyelography with interpretation   Surgeon: Lorin PicketScott C. Lessly Stigler, M.D.  Anesthesia: General  Complications: None  Intraoperative findings:  1.  The distal end of the left ureteral stent was external to the urethral meatus.  2.  Diffuse mucosal hyperemia with areas of previous fulguration noted  3.  Left retrograde pyelogram with moderate hydronephrosis and hydroureter to the distal ureter  4.  Right retrograde pyelogram with mild hydronephrosis and a stable appearance in the renal pelvis consistent with clot.  EBL: Minimal  Specimens: None  Indication: [name] is a 42 y.o. patient with a prior hysterectomy and postoperatively had hematuria and left hydronephrosis.  She underwent cystoscopy with fulguration and placement of a left ureteral stent by Dr. Alvester MorinBell on 04/29/2018.  She subsequently developed right hydronephrosis and had a percutaneous nephrostomy tube placed.  A nephrostogram earlier this week showed good emptying and she had no pain with clamping of the tube.  The nephrostomy was removed earlier this week and she developed right flank pain and has had a rising creatinine from 0.95-3.22.  Renal ultrasound showed moderate bilateral hydronephrosis.  She also received IV contrast on 6/21.    After reviewing the management options for treatment, he elected to proceed with the above surgical procedure(s). We have discussed the potential benefits and risks of the procedure, side effects of the proposed treatment, the likelihood of the patient achieving the goals of the procedure, and any potential problems that might occur during the procedure or recuperation. Informed consent has been  obtained.  Description of procedure:  The patient was taken to the operating room and general anesthesia was induced.  The patient was placed in the dorsal lithotomy position, prepped and draped in the usual sterile fashion, and preoperative antibiotics were administered. A preoperative time-out was performed.   The patient's urethra was examined and the distal stent was protruding through the urethral meatus.  A 22 French cystoscope was lubricated and passed per urethra.  A 0.038 Sensor wire was placed through the scope into the left ureteral orifice and advanced up to the renal pelvis under fluoroscopic guidance.  The stent was then removed.  A 6 French open-ended ureteral catheter was placed over the guidewire and after removal of the guidewire retrograde pyelogram was performed with findings as described above.  The guidewire was replaced and the ureteral catheter was removed.  A 6 French/22 cm Bard Optima stent was placed after back loading the wire through the cystoscope.  There was a good curl noted in the renal pelvis and bladder.  There was brisk efflux noted through the stent.  Panendoscopy was then performed with findings as described above.  A definite right ureteral orifice could not be visualized in several areas were probed with both a sensor wire and Glidewire.  One area was subsequently found that would correspond to the right ureteral orifice however a sensor wire Glidewire would not advance beyond 1 cm.  The cystoscope was removed and a 4.5 French semirigid ureteroscope was passed per urethra.  This area was difficult to identify however the scope was placed on the right hemitrigone and several areas were again probed and the Glidewire which subsequently advanced without resistance. Right retrograde pyelogram was then performed with  findings as described above.  Aspiration through the ureteral catheter did return thick darkish blood consistent old blood.  A sensor wire was placed through the  ureteral catheter which was removed.  The guidewire was backloaded on the cystoscope and a Bard Optima 6 French/24 cm stent was placed with good position noted proximally and distally.  No significant bleeding was noted.    The bladder was then emptied and and all instruments were removed.  After anesthetic reversal the patient was transported to the PACU in stable condition.

## 2018-05-18 NOTE — Anesthesia Procedure Notes (Signed)
Procedure Name: Intubation Date/Time: 05/18/2018 10:10 AM Performed by: Dava NajjarFrazier, Tonnya Garbett, CRNA Pre-anesthesia Checklist: Patient identified, Emergency Drugs available, Suction available, Patient being monitored and Timeout performed Patient Re-evaluated:Patient Re-evaluated prior to induction Oxygen Delivery Method: Circle system utilized Preoxygenation: Pre-oxygenation with 100% oxygen Induction Type: IV induction Ventilation: Mask ventilation without difficulty Laryngoscope Size: McGraph and 3 Grade View: Grade I Tube type: Oral Tube size: 7.0 mm Number of attempts: 1 Airway Equipment and Method: Stylet and Video-laryngoscopy Placement Confirmation: positive ETCO2 and breath sounds checked- equal and bilateral Secured at: 20 cm Tube secured with: Tape Dental Injury: Teeth and Oropharynx as per pre-operative assessment

## 2018-05-18 NOTE — Progress Notes (Addendum)
PT Cancellation Note  Patient Details Name: Jonelle SidleGinger B Riddle MRN: [id number] DOB: 08/08/1976   Cancelled Treatment:    Reason Eval/Treat Not Completed: Patient at procedure or test/unavailable(Per chart review, patient currently off unit for procedure (ureteral stenting).  Per policy, will require new orders to resume care after procedure due to general anesthesia.   Will continue to follow; please update as medically appropriate.)  Treyana Sturgell H. Manson PasseyBrown, PT, DPT, NCS 05/18/18, 10:28 AM [phone]

## 2018-05-18 NOTE — Progress Notes (Signed)
Spoke to Dr Dalbert GarnetBeasley re possible pt transport to [facility]. Transfer center notified, spoke with Theodoro Gristave. Face sheet faxed to [phone] per his request. Provided transfer center with Dr Francena HanlyBeasley's cell number. Awaiting further information at this time.

## 2018-05-18 NOTE — Anesthesia Preprocedure Evaluation (Signed)
Anesthesia Evaluation  Patient identified by MRN, date of birth, ID band Patient awake    Reviewed: Allergy & Precautions, NPO status , Patient's Chart, lab work & pertinent test results  History of Anesthesia Complications Negative for: history of anesthetic complications  Airway Mallampati: III       Dental  (+) Chipped   Pulmonary neg sleep apnea, neg COPD,           Cardiovascular (-) hypertension(-) Past MI and (-) CHF + dysrhythmias (Tachycardia) (-) Valvular Problems/Murmurs     Neuro/Psych neg Seizures    GI/Hepatic Neg liver ROS, GERD  Medicated and Controlled,  Endo/Other  neg diabetes  Renal/GU negative Renal ROS     Musculoskeletal   Abdominal   Peds  Hematology  (+) anemia ,   Anesthesia Other Findings Pt with extremity and facial edema  Reproductive/Obstetrics                             Anesthesia Physical  Anesthesia Plan  ASA: III and emergent  Anesthesia Plan: General   Post-op Pain Management:    Induction: Intravenous  PONV Risk Score and Plan: 3 and Dexamethasone, Ondansetron and Midazolam  Airway Management Planned: Oral ETT  Additional Equipment:   Intra-op Plan:   Post-operative Plan: Possible Post-op intubation/ventilation and Extubation in OR  Informed Consent: I have reviewed the patients History and Physical, chart, labs and discussed the procedure including the risks, benefits and alternatives for the proposed anesthesia with the patient or authorized representative who has indicated his/her understanding and acceptance.     Plan Discussed with:   Anesthesia Plan Comments:         Anesthesia Quick Evaluation

## 2018-05-18 NOTE — Discharge Summary (Signed)
Discharge Summary  Patient Name: [name] DOB: 07/25/1976 MRN: [id number]  Date of Admission: 04/27/2018 Date of Discharge: 05/19/2018  Hospital course:   This is a 42 year old female admitted for planned laparoscopic TLH BSO and cystoscopy on 05/07/2018 for abnormal uterine bleeding.  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  We gave her low molecular weight heparin prior to taking her back to the OR.  Laparoscopic review of her pelvis revealed bilateral aberrant vessels, enlarged lateral to the uterus bilaterally.  The case was converted to laparotomy prior to bleeding in anticipation of bleeding.  She had a normal cystoscopy prior to the procedure, and the cystoscopy following the procedure noted bilateral ureteral jets of clear urine, but significantly irritated appearing bladder mucosa with bleeding.  She lost 3600 mL EBL during surgery from the aberrant vessels, received 4 units of blood, 2 pools of cryo, 1 pack of platelets, 1 unit of fresh frozen plasma, and albumin. Her vitals were stable throughout.  Postoperatively she experienced acute tubular necrosis, and was in the ICU during that recovery.  She began to have lower extremity edema, which was treated with compression stockings.  She was on prophylactic heparin during this time.  She experienced mild ileus, that recovered with an NG tube after several days.  On postoperative day 1, she was taken back to the operating room by urology and a left ureteral stent placed.  She eventually had a right percutaneous nephrostomy tube placed, which was removed on 6/25.  No injury, direct or thermal, to bladder or ureters from the original surgery was noted, but it was presumed that her pelvic edema was compressing the bilateral ureters. She also had a stable 5cm hematoma in the pelvis without active bleeding. She was on continuous bladder irrigation  for several days, and did keep her Foley in place for several weeks.  She is able to void spontaneously at this time.  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  Her lower extremities were wrapped and she wore compression hose to try and combat the diffuse caval distribution edema that is her limiting factor.  She has a left central line in.  Blood cultures have been negative for infection, and she has received several courses of antibiotics during her stay with us, in response to elevated white blood cell counts.  However, after CT angiogram with IV contrast, she again experienced ATN and her creatinine is rising.  Urology took her on 6/28 back to the operating room for cystoscopy, left stent exchange, and right ureteral stent placement. She has continued to have hematuria throughout her stay, but is voiding spontaneously. Creatinine today 3.59 (3.22, 2.96, 2.3)   She received a second transfusion on 6/20 with an appropriate rise in H/H. However, her hgb does trend down without evidence of intraperitoneal bleeding, but with continued hematuria.  Her albumin has hovered just above 2, and nutrition has been working with her to increase her nutritional status, recommending 100-120g of protein daily.  She is also seeing psychiatry for management of acute stress reaction during her hospital stay, pastoral care for support, physical therapy and occupational Therapy, as well as nephrology to manage her kidney function.  She and her family initially declined working towards inpatient rehab, and she was planning to go home with home health and medical equipment. Her ability to ambulate  is limited by her edema, and after discussion with them, they request transfer for a second opinion. She initially had her DVT surgery at [facility] 25 years ago.  She has no acute infections, her skin is beginning to appear stressed  over dependent portions of her back, and she is tolerating the lower extremities wraps as well.   Her family is engaged and present at the bedside.  Discharge Physical Exam: BP 110/66 (BP Location: Right Arm)   Pulse 99   Temp (!) 96.6 F (35.9 C) (Axillary)   Resp 17   Ht 5\' 5"  (1.651 m)   Wt 117.5 kg (259 lb 0.7 oz)   LMP 04/22/2018   SpO2 99%   BMI 43.11 kg/m   General: NAD CV: RRR, tachycardic earlier today Pulm: CTABL, nl effort, bilateral bases decreased breath sounds ABD: s/nd/nt, anasarca to above the umbilicus. Incision: c/d/i, surrounded by swelling but no evidence of wound dehiscence, breakdown or cellulitis. Ext: Lower wrapped with compression stockings and ace bandages, gently less firm that previously. Upper unremarkable  Hemoglobin  Date Value Ref Range Status  05/19/2018 8.9 (L) 12.0 - 16.0 g/dL Final   HCT  Date Value Ref Range Status  05/19/2018 25.5 (L) 35.0 - 47.0 % Final    Plan:  [name] was transferred to [facility] at her request. She will need skin care support as well as vascular support, and monitoring for kidney function. The hope is that vascular surgery there will be able to offer a long-term plan for return to function.   Follow-up Information    Christeen Douglas, MD In 2 weeks.   Specialty:  Obstetrics and Gynecology Why:  For postop check Contact information: [address] [place] [address] [phone]        Christeen Douglas, MD In 2 weeks.   Specialty:  Obstetrics and Gynecology Why:  For postop check Contact information: [address] [place] [address] [phone]           Signed: Christeen Douglas, MD 7:56 AM

## 2018-05-19 ENCOUNTER — Encounter: Payer: Self-pay | Admitting: Urology

## 2018-05-19 DIAGNOSIS — N179 Acute kidney failure, unspecified: Secondary | ICD-10-CM

## 2018-05-19 DIAGNOSIS — R310 Gross hematuria: Secondary | ICD-10-CM

## 2018-05-19 DIAGNOSIS — N1330 Unspecified hydronephrosis: Secondary | ICD-10-CM

## 2018-05-19 LAB — CBC
HCT: 25.5 % — ABNORMAL LOW (ref 35.0–47.0)
Hemoglobin: 8.9 g/dL — ABNORMAL LOW (ref 12.0–16.0)
MCH: 31.9 pg (ref 26.0–34.0)
MCHC: 34.7 g/dL (ref 32.0–36.0)
MCV: 92.2 fL (ref 80.0–100.0)
PLATELETS: 286 10*3/uL (ref 150–440)
RBC: 2.77 MIL/uL — AB (ref 3.80–5.20)
RDW: 16.8 % — AB (ref 11.5–14.5)
WBC: 8.9 10*3/uL (ref 3.6–11.0)

## 2018-05-19 LAB — COMPREHENSIVE METABOLIC PANEL
ANION GAP: 11 (ref 5–15)
AST: 26 U/L (ref 15–41)
Albumin: 2.1 g/dL — ABNORMAL LOW (ref 3.5–5.0)
Alkaline Phosphatase: 196 U/L — ABNORMAL HIGH (ref 38–126)
BUN: 49 mg/dL — ABNORMAL HIGH (ref 6–20)
CHLORIDE: 94 mmol/L — AB (ref 98–111)
CO2: 24 mmol/L (ref 22–32)
CREATININE: 3.59 mg/dL — AB (ref 0.44–1.00)
Calcium: 8 mg/dL — ABNORMAL LOW (ref 8.9–10.3)
GFR, EST AFRICAN AMERICAN: 17 mL/min — AB (ref >60)
GFR, EST NON AFRICAN AMERICAN: 15 mL/min — AB (ref >60)
Glucose, Bld: 140 mg/dL — ABNORMAL HIGH (ref 70–99)
POTASSIUM: 4.3 mmol/L (ref 3.5–5.1)
SODIUM: 129 mmol/L — AB (ref 135–145)
Total Bilirubin: 0.4 mg/dL (ref 0.3–1.2)
Total Protein: 6.7 g/dL (ref 6.5–8.1)

## 2018-05-19 NOTE — Progress Notes (Signed)
[facility] transport called earlier to inform me that they would not be able to transport the patient until night shift came in which would be after 8pm.  Other company's called including [facility].  [facility] will be doing the transport and are due to come around 1915pm

## 2018-05-19 NOTE — Progress Notes (Signed)
[facility]  ROUNDING NOTE   Subjective:   Family at bedside.   Creatinine 3.59 (3.22)  hgb 8.9  Objective:  Vital signs in last 24 hours:  Temp:  [96.6 F (35.9 C)-97.8 F (36.6 C)] 96.6 F (35.9 C) (06/29 0446) Pulse Rate:  [99-114] 99 (06/29 0446) Resp:  [11-19] 17 (06/29 0446) BP: (96-142)/(65-92) 110/66 (06/29 0446) SpO2:  [94 %-100 %] 99 % (06/29 0446) Weight:  [117.5 kg (259 lb 0.7 oz)] 117.5 kg (259 lb 0.7 oz) (06/29 0605)  Weight change: -0.103 kg (-3.6 oz) Filed Weights   05/18/18 0509 05/18/18 0950 05/19/18 0605  Weight: 132.1 kg (291 lb 3.6 oz) 132 kg (291 lb) 117.5 kg (259 lb 0.7 oz)    Intake/Output: I/O last 3 completed shifts: In: 400 [I.V.:400] Out: -    Intake/Output this shift:  No intake/output data recorded.  Physical Exam: General: NAD,   Head: Normocephalic, atraumatic. Moist oral mucosal membranes  Eyes: Anicteric, PERRL  Neck: Supple, trachea midline  Lungs:  Clear to auscultation  Heart: Regular rate and rhythm  Abdomen:  Soft, nontender  Extremities: 1+ peripheral edema, compression hose, SEDs  Neurologic: Nonfocal, moving all four extremities  Skin: No lesions        Basic Metabolic Panel: Recent Labs  Lab 05/15/18 0500 05/15/18 0611 05/16/18 0741 05/17/18 0512 05/18/18 0522 05/19/18 0427  NA 128*  --  128* 126*  128* 128* 129*  K 3.4*  --  4.0 3.9  4.0 3.8 4.3  CL 93*  --  93* 91*  93* 93* 94*  CO2 25  --  24 23  23 23 24   GLUCOSE 113*  --  123* 120*  123* 128* 140*  BUN 24*  --  33* 36*  37* 40* 49*  CREATININE 1.37*  --  2.30* 2.97*  2.96* 3.22* 3.59*  CALCIUM 7.8*  --  7.8* 7.7*  7.8* 8.0* 8.0*  MG  --  1.6*  --   --   --   --   PHOS  --  3.0 3.7 3.9 4.6  --     Liver Function Tests: Recent Labs  Lab 05/14/18 0554 05/15/18 0500 05/16/18 0741 05/17/18 0512 05/18/18 0522 05/19/18 0427  AST 52* 53*  --  45* 36 26  ALT 20 14  --  5 <5 <5  ALKPHOS 215* 254*  --  253* 228* 196*  BILITOT  0.7 0.7  --  0.8 0.6 0.4  PROT 6.2* 6.5  --  6.3* 6.5 6.7  ALBUMIN 2.3* 2.6* 2.3* 2.2*  2.3* 2.3* 2.1*   No results for input(s): LIPASE, AMYLASE in the last 168 hours. No results for input(s): AMMONIA in the last 168 hours.  CBC: Recent Labs  Lab 05/14/18 0554 05/15/18 0500 05/16/18 0739 05/17/18 0512 05/18/18 0522 05/19/18 0427  WBC 12.6* 11.1*  --  11.5* 8.5 8.9  HGB 7.7* 7.6* 9.5* 9.4* 9.2* 8.9*  HCT 22.5* 21.9* 27.1* 27.2* 26.8* 25.5*  MCV 91.0 91.0  --  91.4 91.8 92.2  PLT 305 303  --  274 290 286    Cardiac Enzymes: No results for input(s): CKTOTAL, CKMB, CKMBINDEX, TROPONINI in the last 168 hours.  BNP: Invalid input(s): POCBNP  CBG: Recent Labs  Lab 05/13/18 1110  GLUCAP 145*    Microbiology: Results for orders placed or performed during the hospital encounter of 04/27/18  MRSA PCR Screening     Status: None   Collection Time: 04/27/18 10:27 PM  Result  Value Ref Range Status   MRSA by PCR NEGATIVE NEGATIVE Final    Comment:        The GeneXpert MRSA Assay (FDA approved for NASAL specimens only), is one component of a comprehensive MRSA colonization surveillance program. It is not intended to diagnose MRSA infection nor to guide or monitor treatment for MRSA infections. Performed at [facility], [address] [place], [place] [address]   CULTURE, BLOOD (ROUTINE X 2) w Reflex to ID Panel     Status: None   Collection Time: 04/28/18  9:24 PM  Result Value Ref Range Status   Specimen Description BLOOD RIGHT HAND  Final   Special Requests   Final    BOTTLES DRAWN AEROBIC AND ANAEROBIC Blood Culture adequate volume   Culture   Final    NO GROWTH 5 DAYS Performed at [facility], [address] [place], [place] [address]    Report Status 05/03/2018 FINAL  Final  CULTURE, BLOOD (ROUTINE X 2) w Reflex to ID Panel     Status: None   Collection Time: 04/28/18  9:44 PM  Result Value Ref Range Status   Specimen Description BLOOD  RIGHT ANTECUBITAL  Final   Special Requests   Final    BOTTLES DRAWN AEROBIC AND ANAEROBIC Blood Culture adequate volume   Culture   Final    NO GROWTH 5 DAYS Performed at [facility], [address] [place], [place] [address]    Report Status 05/03/2018 FINAL  Final  Urine Culture     Status: None   Collection Time: 05/09/18  4:30 PM  Result Value Ref Range Status   Specimen Description   Final    URINE, CATHETERIZED Performed at [facility], [address] [place], [place] [address]    Special Requests   Final    Normal Performed at [facility], [address] [place], [place] [address]    Culture   Final    NO GROWTH Performed at [facility], [address] [place], [place] [address]    Report Status 05/10/2018 FINAL  Final  CULTURE, BLOOD (ROUTINE X 2) w Reflex to ID Panel     Status: Abnormal   Collection Time: 05/09/18  6:02 PM  Result Value Ref Range Status   Specimen Description   Final    BLOOD LEFT HAND Performed at [facility], [address] [place], [place] [address]    Special Requests   Final    BOTTLES DRAWN AEROBIC AND ANAEROBIC Blood Culture adequate volume Performed at [facility], [address] [place], [place] [address]    Culture  Setup Time   Final    AEROBIC BOTTLE ONLY GRAM POSITIVE COCCI CRITICAL RESULT CALLED TO, READ BACK BY AND VERIFIED WITH: LISA KLUTTZ 05/10/18 1433 KLW    Culture (A)  Final    STAPHYLOCOCCUS SPECIES (COAGULASE NEGATIVE) THE SIGNIFICANCE OF ISOLATING THIS ORGANISM FROM A SINGLE SET OF BLOOD CULTURES WHEN MULTIPLE SETS ARE DRAWN IS UNCERTAIN. PLEASE NOTIFY THE MICROBIOLOGY DEPARTMENT WITHIN ONE WEEK IF SPECIATION AND SENSITIVITIES ARE REQUIRED. Performed at [facility], [address] [place], [place] [address]    Report Status 05/12/2018 FINAL  Final  Blood Culture ID Panel (Reflexed)     Status: Abnormal   Collection Time: 05/09/18  6:02 PM   Result Value Ref Range Status   Enterococcus species NOT DETECTED NOT DETECTED Final   Listeria monocytogenes NOT DETECTED NOT  DETECTED Final   Staphylococcus species DETECTED (A) NOT DETECTED Final    Comment: Methicillin (oxacillin) susceptible coagulase negative staphylococcus. Possible blood culture contaminant (unless isolated from more than one blood culture draw or clinical case suggests pathogenicity). No antibiotic treatment is indicated for blood  culture contaminants. CRITICAL RESULT CALLED TO, READ BACK BY AND VERIFIED WITH: LISA KLUTTZ 05/10/18 1433 KLW    Staphylococcus aureus NOT DETECTED NOT DETECTED Final   Methicillin resistance NOT DETECTED NOT DETECTED Final   Streptococcus species NOT DETECTED NOT DETECTED Final   Streptococcus agalactiae NOT DETECTED NOT DETECTED Final   Streptococcus pneumoniae NOT DETECTED NOT DETECTED Final   Streptococcus pyogenes NOT DETECTED NOT DETECTED Final   Acinetobacter baumannii NOT DETECTED NOT DETECTED Final   Enterobacteriaceae species NOT DETECTED NOT DETECTED Final   Enterobacter cloacae complex NOT DETECTED NOT DETECTED Final   Escherichia coli NOT DETECTED NOT DETECTED Final   Klebsiella oxytoca NOT DETECTED NOT DETECTED Final   Klebsiella pneumoniae NOT DETECTED NOT DETECTED Final   Proteus species NOT DETECTED NOT DETECTED Final   Serratia marcescens NOT DETECTED NOT DETECTED Final   Haemophilus influenzae NOT DETECTED NOT DETECTED Final   Neisseria meningitidis NOT DETECTED NOT DETECTED Final   Pseudomonas aeruginosa NOT DETECTED NOT DETECTED Final   Candida albicans NOT DETECTED NOT DETECTED Final   Candida glabrata NOT DETECTED NOT DETECTED Final   Candida krusei NOT DETECTED NOT DETECTED Final   Candida parapsilosis NOT DETECTED NOT DETECTED Final   Candida tropicalis NOT DETECTED NOT DETECTED Final    Comment: Performed at [facility], [address] [place], [place] [address]  CULTURE, BLOOD (ROUTINE X 2)  w Reflex to ID Panel     Status: None   Collection Time: 05/09/18  7:00 PM  Result Value Ref Range Status   Specimen Description BLOOD RIGHT HAND  Final   Special Requests   Final    BOTTLES DRAWN AEROBIC AND ANAEROBIC Blood Culture adequate volume   Culture   Final    NO GROWTH 5 DAYS Performed at [facility], [address] [place], [place] [address]    Report Status 05/14/2018 FINAL  Final  CULTURE, BLOOD (ROUTINE X 2) w Reflex to ID Panel     Status: None   Collection Time: 05/10/18  7:10 PM  Result Value Ref Range Status   Specimen Description BLOOD LINE DRAW PER MD  Final   Special Requests   Final    BOTTLES DRAWN AEROBIC AND ANAEROBIC Blood Culture results may not be optimal due to an excessive volume of blood received in culture bottles   Culture   Final    NO GROWTH 5 DAYS Performed at [facility], [address] [place], [place] [address]    Report Status 05/15/2018 FINAL  Final    Coagulation Studies: No results for input(s): LABPROT, INR in the last 72 hours.  Urinalysis: No results for input(s): COLORURINE, LABSPEC, PHURINE, GLUCOSEU, HGBUR, BILIRUBINUR, KETONESUR, PROTEINUR, UROBILINOGEN, NITRITE, LEUKOCYTESUR in the last 72 hours.  Invalid input(s): APPERANCEUR    Imaging: No results found.   Medications:    . sodium chloride   Intravenous Once  . docusate sodium  100 mg Oral BID  . feeding supplement (ENSURE ENLIVE)  237 mL Oral TID BM  . furosemide  20 mg Oral Daily  . mirabegron ER  25 mg Oral Daily  . mirtazapine  15 mg Oral QHS  . multivitamin with minerals  1 tablet Oral  Daily  . nystatin  5 mL Oral QID  . pantoprazole  40 mg Oral Daily  . polyethylene glycol  17 g Oral Daily  . sodium chloride flush  10-40 mL Intracatheter Q12H   ALPRAZolam, calcium carbonate, fluticasone, HYDROmorphone (DILAUDID) injection, lidocaine, Melatonin, menthol-cetylpyridinium, [DISCONTINUED] ondansetron **OR** ondansetron (ZOFRAN) IV,  opium-belladonna, oxyCODONE-acetaminophen, phenazopyridine, phenol, promethazine, sodium chloride flush  Assessment/ Plan:  Ms. HARLIE BUENING is a 42 y.o. whtie female  post op abdominal hysterectomy, left salpingo-oophorectomy, laparoscopic lysis of adhesions and retroperitoneal dissection on 04/28/2018. Followed by cystoscopy with retrograde pyelogram and left ureteral stent placement on 04/29/2018 Baseline creatinine is 0.79 from 04/19/2018  1. Acute renal failure: secondary to ATN and obstructive uropathy. Creatinine now rising. Most likely due to IV contrast exposure on 6/21.  - Appreciate urology input, cystoscopy with right stent placement and left stent replacement on 6/28 by Dr. Lonna Cobb - Continue furosemide 20mg  PO daily for edema   2. Generalized edema/Anasarca: with albumin of 2.1 - Encourage PO intake.  - Discussed IV albumin with patient  - furosemide 20mg  PO daily  3.  Hyponatremia.  Na 129 - most likely secondary to anasarca/volume status. Improving.  - encourage PO intake.  - PO furosemide  4.  Anemia from blood loss: status post PRBC transfusion 6/25 and 6/26. Hemoglobin trended to 8.9 today.    LOS: 22 [name] 6/29/201911:06 AM

## 2018-05-19 NOTE — Progress Notes (Signed)
Order received for discharge from Dr Dalbert GarnetBeasley

## 2018-05-19 NOTE — Progress Notes (Signed)
OT Cancellation Note  Patient Details Name: [name] MRN: [id number] DOB: 07/03/1976   Cancelled Treatment:    Reason Eval/Treat Not Completed: Other (comment)([facility]  Olegario MessierElaine Tanijah Morais, MS, OTR/L 05/19/2018, 1:53 PM

## 2018-05-19 NOTE — Progress Notes (Signed)
Pess with the Transfer Center called with a bed for patient, # [phone] at [facility] -address [address] Accepting is Dr Wess Bottsana Clifton. Call report to [phone]. Call ground [employer] at [phone] when d/c summary and EMTALA form completed. 0630 notified Dr Dalbert GarnetBeasley of bed and that the patient voiced concern as to "not going right now was going to wait to see if I got better since surgery", mom at bedside. Dr Dalbert GarnetBeasley to come see pt, pt aware.

## 2018-05-19 NOTE — Progress Notes (Signed)
Urology Inpatient Progress Report  Abnormal uterine bleeding - menometrorrhagia  Procedure(s): CYSTOSCOPY WITH STENT REPLACEMENT CYSTOSCOPY WITH RETROGRADE PYELOGRAM CYSTOSCOPY WITH STENT PLACEMENT  1 Day Post-Op   Intv/Subj: No acute events overnight. Patient is without complaint. Unfortunately, creatinine continues to worsen.  Now 3.59.  The patient herself states that she actually feels improved.  She is voiding well.  She has no urological complaints except for some frequency and urgency consistent with having bilateral ureteral stents.  Afebrile vital signs stable.  She has been accepted to [facility] and it looks like the plan is to transfer her there today.  Principal Problem:   Adjustment disorder with mixed anxiety and depressed mood Active Problems:   S/P abdominal hysterectomy  Current Facility-Administered Medications  Medication Dose Route Frequency Provider Last Rate Last Dose  . 0.9 %  sodium chloride infusion (Manually program via Guardrails IV Fluids)   Intravenous Once Stoioff, Scott C, MD      . ALPRAZolam Prudy Feeler) tablet 1 mg  1 mg Oral TID PRN Riki Altes, MD   1 mg at 05/18/18 2229  . calcium carbonate (TUMS - dosed in mg elemental calcium) chewable tablet 200 mg of elemental calcium  1 tablet Oral 5 X Daily PRN Stoioff, Scott C, MD   200 mg of elemental calcium at 05/12/18 1235  . docusate sodium (COLACE) capsule 100 mg  100 mg Oral BID Irineo Axon C, MD   100 mg at 05/18/18 2135  . feeding supplement (ENSURE ENLIVE) (ENSURE ENLIVE) liquid 237 mL  237 mL Oral TID BM Stoioff, Scott C, MD   237 mL at 05/18/18 2135  . fluticasone (FLONASE) 50 MCG/ACT nasal spray 1 spray  1 spray Each Nare PRN Stoioff, Scott C, MD      . furosemide (LASIX) tablet 20 mg  20 mg Oral Daily Stoioff, Scott C, MD   20 mg at 05/17/18 1149  . HYDROmorphone (DILAUDID) injection 0.5 mg  0.5 mg Intravenous Q8H PRN Stoioff, Scott C, MD   0.5 mg at 05/18/18 2141  . lidocaine  (XYLOCAINE) 5 % ointment   Topical BID PRN Stoioff, Scott C, MD      . Melatonin TABS 5 mg  5 mg Oral QHS PRN Stoioff, Verna Czech, MD   5 mg at 05/08/18 0019  . menthol-cetylpyridinium (CEPACOL) lozenge 3 mg  1 lozenge Oral Q2H PRN Stoioff, Scott C, MD      . mirabegron ER (MYRBETRIQ) tablet 25 mg  25 mg Oral Daily Stoioff, Scott C, MD   25 mg at 05/17/18 0926  . mirtazapine (REMERON) tablet 15 mg  15 mg Oral QHS Stoioff, Scott C, MD   15 mg at 05/18/18 2135  . multivitamin with minerals tablet 1 tablet  1 tablet Oral Daily Stoioff, Scott C, MD   1 tablet at 05/17/18 0927  . nystatin (MYCOSTATIN) 100000 UNIT/ML suspension 500,000 Units  5 mL Oral QID Riki Altes, MD   500,000 Units at 05/18/18 2136  . ondansetron (ZOFRAN) injection 4 mg  4 mg Intravenous Q6H PRN Stoioff, Scott C, MD   4 mg at 05/16/18 0141  . opium-belladonna (B&O SUPPRETTES) 16.2-60 MG suppository 1 suppository  1 suppository Rectal Q8H PRN Riki Altes, MD   1 suppository at 05/15/18 1135  . oxyCODONE-acetaminophen (PERCOCET/ROXICET) 5-325 MG per tablet 1-2 tablet  1-2 tablet Oral Q6H PRN Riki Altes, MD   2 tablet at 05/19/18 0442  . pantoprazole (PROTONIX) EC tablet 40 mg  40 mg Oral Daily Stoioff, Scott C, MD   40 mg at 05/17/18 0926  . phenazopyridine (PYRIDIUM) tablet 200 mg  200 mg Oral Q8H PRN Stoioff, Scott C, MD   200 mg at 05/11/18 0004  . phenol (CHLORASEPTIC) mouth spray 1 spray  1 spray Mouth/Throat PRN Stoioff, Scott C, MD      . polyethylene glycol (MIRALAX / GLYCOLAX) packet 17 g  17 g Oral Daily Stoioff, Scott C, MD   17 g at 05/17/18 0926  . promethazine (PHENERGAN) injection 12.5-25 mg  12.5-25 mg Intravenous Q6H PRN Stoioff, Scott C, MD   25 mg at 05/03/18 0000  . sodium chloride flush (NS) 0.9 % injection 10-40 mL  10-40 mL Intracatheter Q12H Stoioff, Scott C, MD   10 mL at 05/18/18 2143  . sodium chloride flush (NS) 0.9 % injection 10-40 mL  10-40 mL Intracatheter PRN Irineo Axon C, MD   10 mL at  05/18/18 0223     Objective: Vital: Vitals:   05/18/18 1445 05/18/18 1949 05/19/18 0446 05/19/18 0605  BP: 134/76 114/65 110/66   Pulse: (!) 107 (!) 101 99   Resp:  18 17   Temp: 97.7 F (36.5 C) 97.8 F (36.6 C) (!) 96.6 F (35.9 C)   TempSrc: Oral Oral Axillary   SpO2: 99% 99% 99%   Weight:    117.5 kg (259 lb 0.7 oz)  Height:       I/Os: I/O last 3 completed shifts: In: 400 [I.V.:400] Out: -   Physical Exam:  General: Patient is in no apparent distress Lungs: Normal respiratory effort, chest expands symmetrically. GI: The abdomen is soft and nontender without mass. Ext: lower extremities symmetric  Lab Results: Recent Labs    05/17/18 0512 05/18/18 0522 05/19/18 0427  WBC 11.5* 8.5 8.9  HGB 9.4* 9.2* 8.9*  HCT 27.2* 26.8* 25.5*   Recent Labs    05/17/18 0512 05/18/18 0522 05/19/18 0427  NA 126*  128* 128* 129*  K 3.9  4.0 3.8 4.3  CL 91*  93* 93* 94*  CO2 23  23 23 24   GLUCOSE 120*  123* 128* 140*  BUN 36*  37* 40* 49*  CREATININE 2.97*  2.96* 3.22* 3.59*  CALCIUM 7.7*  7.8* 8.0* 8.0*   No results for input(s): LABPT, INR in the last 72 hours. No results for input(s): LABURIN in the last 72 hours. Results for orders placed or performed during the hospital encounter of 04/27/18  MRSA PCR Screening     Status: None   Collection Time: 04/27/18 10:27 PM  Result Value Ref Range Status   MRSA by PCR NEGATIVE NEGATIVE Final    Comment:        The GeneXpert MRSA Assay (FDA approved for NASAL specimens only), is one component of a comprehensive MRSA colonization surveillance program. It is not intended to diagnose MRSA infection nor to guide or monitor treatment for MRSA infections. Performed at [facility], [address] [place], [place] [address]   CULTURE, BLOOD (ROUTINE X 2) w Reflex to ID Panel     Status: None   Collection Time: 04/28/18  9:24 PM  Result Value Ref Range Status   Specimen Description BLOOD RIGHT HAND   Final   Special Requests   Final    BOTTLES DRAWN AEROBIC AND ANAEROBIC Blood Culture adequate volume   Culture   Final    NO GROWTH 5 DAYS Performed at [facility] [address] [place],  [place] [address]    Report Status 05/03/2018 FINAL  Final  CULTURE, BLOOD (ROUTINE X 2) w Reflex to ID Panel     Status: None   Collection Time: 04/28/18  9:44 PM  Result Value Ref Range Status   Specimen Description BLOOD RIGHT ANTECUBITAL  Final   Special Requests   Final    BOTTLES DRAWN AEROBIC AND ANAEROBIC Blood Culture adequate volume   Culture   Final    NO GROWTH 5 DAYS Performed at [facility], [address], [place], [place] [address]    Report Status 05/03/2018 FINAL  Final  Urine Culture     Status: None   Collection Time: 05/09/18  4:30 PM  Result Value Ref Range Status   Specimen Description   Final    URINE, CATHETERIZED Performed at [facility], [address], [place], [place] [address]    Special Requests   Final    Normal Performed at [facility], [address], [place], [place] [address]    Culture   Final    NO GROWTH Performed at [facility], [address], [place], [place] [address]    Report Status 05/10/2018 FINAL  Final  CULTURE, BLOOD (ROUTINE X 2) w Reflex to ID Panel     Status: Abnormal   Collection Time: 05/09/18  6:02 PM  Result Value Ref Range Status   Specimen Description   Final    BLOOD LEFT HAND Performed at [facility], [address] [place], [place] [address]    Special Requests   Final    BOTTLES DRAWN AEROBIC AND ANAEROBIC Blood Culture adequate volume Performed at [facility], [address] [place], [place] [address]    Culture  Setup Time   Final    AEROBIC BOTTLE ONLY GRAM POSITIVE COCCI CRITICAL RESULT CALLED TO, READ BACK BY AND VERIFIED WITH: LISA KLUTTZ 05/10/18 1433 KLW    Culture (A)  Final    STAPHYLOCOCCUS SPECIES (COAGULASE NEGATIVE) THE  SIGNIFICANCE OF ISOLATING THIS ORGANISM FROM A SINGLE SET OF BLOOD CULTURES WHEN MULTIPLE SETS ARE DRAWN IS UNCERTAIN. PLEASE NOTIFY THE MICROBIOLOGY DEPARTMENT WITHIN ONE WEEK IF SPECIATION AND SENSITIVITIES ARE REQUIRED. Performed at [facility], [address] [place], [place] [address]    Report Status 05/12/2018 FINAL  Final  Blood Culture ID Panel (Reflexed)     Status: Abnormal   Collection Time: 05/09/18  6:02 PM  Result Value Ref Range Status   Enterococcus species NOT DETECTED NOT DETECTED Final   Listeria monocytogenes NOT DETECTED NOT DETECTED Final   Staphylococcus species DETECTED (A) NOT DETECTED Final    Comment: Methicillin (oxacillin) susceptible coagulase negative staphylococcus. Possible blood culture contaminant (unless isolated from more than one blood culture draw or clinical case suggests pathogenicity). No antibiotic treatment is indicated for blood  culture contaminants. CRITICAL RESULT CALLED TO, READ BACK BY AND VERIFIED WITH: LISA KLUTTZ 05/10/18 1433 KLW    Staphylococcus aureus NOT DETECTED NOT DETECTED Final   Methicillin resistance NOT DETECTED NOT DETECTED Final   Streptococcus species NOT DETECTED NOT DETECTED Final   Streptococcus agalactiae NOT DETECTED NOT DETECTED Final   Streptococcus pneumoniae NOT DETECTED NOT DETECTED Final   Streptococcus pyogenes NOT DETECTED NOT DETECTED Final   Acinetobacter baumannii NOT DETECTED NOT DETECTED Final   Enterobacteriaceae species NOT DETECTED NOT DETECTED Final   Enterobacter cloacae complex NOT DETECTED NOT DETECTED Final   Escherichia coli NOT DETECTED NOT DETECTED Final  Klebsiella oxytoca NOT DETECTED NOT DETECTED Final   Klebsiella pneumoniae NOT DETECTED NOT DETECTED Final   Proteus species NOT DETECTED NOT DETECTED Final   Serratia marcescens NOT DETECTED NOT DETECTED Final   Haemophilus influenzae NOT DETECTED NOT DETECTED Final   Neisseria meningitidis NOT DETECTED NOT DETECTED Final    Pseudomonas aeruginosa NOT DETECTED NOT DETECTED Final   Candida albicans NOT DETECTED NOT DETECTED Final   Candida glabrata NOT DETECTED NOT DETECTED Final   Candida krusei NOT DETECTED NOT DETECTED Final   Candida parapsilosis NOT DETECTED NOT DETECTED Final   Candida tropicalis NOT DETECTED NOT DETECTED Final    Comment: Performed at [facility], [address] [place], [place] [address]  CULTURE, BLOOD (ROUTINE X 2) w Reflex to ID Panel     Status: None   Collection Time: 05/09/18  7:00 PM  Result Value Ref Range Status   Specimen Description BLOOD RIGHT HAND  Final   Special Requests   Final    BOTTLES DRAWN AEROBIC AND ANAEROBIC Blood Culture adequate volume   Culture   Final    NO GROWTH 5 DAYS Performed at [facility], [address] [place], [place] [address]    Report Status 05/14/2018 FINAL  Final  CULTURE, BLOOD (ROUTINE X 2) w Reflex to ID Panel     Status: None   Collection Time: 05/10/18  7:10 PM  Result Value Ref Range Status   Specimen Description BLOOD LINE DRAW PER MD  Final   Special Requests   Final    BOTTLES DRAWN AEROBIC AND ANAEROBIC Blood Culture results may not be optimal due to an excessive volume of blood received in culture bottles   Culture   Final    NO GROWTH 5 DAYS Performed at [facility], [address] [place], [place] [address]    Report Status 05/15/2018 FINAL  Final    Studies/Results: No results found.  Assessment: 1.  Acute renal insufficiency: Worsening.  Unlikely to be purely obstructive in nature given no improvement after bilateral ureteral stent placement.  May be related to ATN following IV contrast administration.  Agree with continuing nephrology evaluation/recommendations. 2.  Gross hematuria: Improving. 3.  Bilateral hydronephrosis: Status post left ureteral stent replacement and right ureteral stent placement.  Would continue these for several weeks until she has completely improved and has  been discharged from the hospital.  [facility].  I assume [facility] will [facility].  Procedure(s): CYSTOSCOPY WITH STENT REPLACEMENT CYSTOSCOPY WITH RETROGRADE PYELOGRAM CYSTOSCOPY WITH STENT PLACEMENT, 1 Day Post-Op     Modena SlaterEugene Bell, MD Urology 05/19/2018, 10:01 AM

## 2018-05-19 NOTE — Progress Notes (Signed)
Patient was having a lot of pain when we transferred to the transport stretcher.  Dr Dalbert GarnetBeasley approved me giving dilaudid early.  EMS escorted to the ER entrance

## 2018-05-19 NOTE — Progress Notes (Signed)
Order was given to discontinue nystatin and okay given for patient to go off the unit in a wheelchair with friends/family.  Report called to RN Candise BowensJen [facility].  [facility] transport notified for transportation

## 2018-05-23 MED ORDER — BELLADONNA ALKALOIDS-OPIUM 16.2-30 MG RE SUPP
1.00 | RECTAL | Status: DC
Start: ? — End: 2018-05-23

## 2018-05-23 MED ORDER — HEPARIN SODIUM (PORCINE) 5000 UNIT/ML IJ SOLN
5000.00 | INTRAMUSCULAR | Status: DC
Start: 2018-05-23 — End: 2018-05-23

## 2018-05-23 MED ORDER — SIMETHICONE 80 MG PO CHEW
80.00 | CHEWABLE_TABLET | ORAL | Status: DC
Start: ? — End: 2018-05-23

## 2018-05-23 MED ORDER — LIDOCAINE HCL 1 % IJ SOLN
0.50 | INTRAMUSCULAR | Status: DC
Start: ? — End: 2018-05-23

## 2018-05-23 MED ORDER — ACETAMINOPHEN 325 MG PO TABS
975.00 | ORAL_TABLET | ORAL | Status: DC
Start: ? — End: 2018-05-23

## 2018-05-23 MED ORDER — OXYBUTYNIN CHLORIDE 5 MG PO TABS
5.00 | ORAL_TABLET | ORAL | Status: DC
Start: ? — End: 2018-05-23

## 2018-05-23 MED ORDER — HYDROXYZINE PAMOATE 25 MG PO CAPS
25.00 | ORAL_CAPSULE | ORAL | Status: DC
Start: ? — End: 2018-05-23

## 2018-05-23 MED ORDER — SENNOSIDES-DOCUSATE SODIUM 8.6-50 MG PO TABS
2.00 | ORAL_TABLET | ORAL | Status: DC
Start: ? — End: 2018-05-23

## 2018-05-23 MED ORDER — MELATONIN 3 MG PO TABS
3.00 | ORAL_TABLET | ORAL | Status: DC
Start: ? — End: 2018-05-23

## 2018-05-23 MED ORDER — ONDANSETRON HCL 4 MG/2ML IJ SOLN
4.00 | INTRAMUSCULAR | Status: DC
Start: ? — End: 2018-05-23

## 2018-05-23 MED ORDER — POLYETHYLENE GLYCOL 3350 17 G PO PACK
17.00 | PACK | ORAL | Status: DC
Start: 2018-05-24 — End: 2018-05-23

## 2018-05-23 MED ORDER — GENERIC EXTERNAL MEDICATION
Status: DC
Start: ? — End: 2018-05-23

## 2018-05-23 MED ORDER — OXYCODONE HCL 5 MG PO TABS
2.50 | ORAL_TABLET | ORAL | Status: DC
Start: ? — End: 2018-05-23

## 2018-06-06 MED ORDER — OXYBUTYNIN CHLORIDE 5 MG PO TABS
5.00 | ORAL_TABLET | ORAL | Status: DC
Start: ? — End: 2018-06-06

## 2018-06-06 MED ORDER — ONDANSETRON 4 MG PO TBDP
4.00 | ORAL_TABLET | ORAL | Status: DC
Start: ? — End: 2018-06-06

## 2018-06-06 MED ORDER — ENOXAPARIN SODIUM 40 MG/0.4ML ~~LOC~~ SOLN
40.00 | SUBCUTANEOUS | Status: DC
Start: 2018-06-07 — End: 2018-06-06

## 2018-06-06 MED ORDER — ONDANSETRON HCL 4 MG/2ML IJ SOLN
4.00 | INTRAMUSCULAR | Status: DC
Start: ? — End: 2018-06-06

## 2018-06-06 MED ORDER — PANTOPRAZOLE SODIUM 40 MG PO TBEC
40.00 | DELAYED_RELEASE_TABLET | ORAL | Status: DC
Start: 2018-06-07 — End: 2018-06-06

## 2018-06-06 MED ORDER — LIDOCAINE 5 % EX PTCH
1.00 | MEDICATED_PATCH | CUTANEOUS | Status: DC
Start: 2018-06-07 — End: 2018-06-06

## 2018-06-06 MED ORDER — TRAZODONE HCL 50 MG PO TABS
100.00 | ORAL_TABLET | ORAL | Status: DC
Start: 2018-06-06 — End: 2018-06-06

## 2018-06-06 MED ORDER — POLYETHYLENE GLYCOL 3350 17 G PO PACK
17.00 | PACK | ORAL | Status: DC
Start: 2018-06-06 — End: 2018-06-06

## 2018-06-06 MED ORDER — BELLADONNA ALKALOIDS-OPIUM 16.2-30 MG RE SUPP
1.00 | RECTAL | Status: DC
Start: ? — End: 2018-06-06

## 2018-06-06 MED ORDER — LIDOCAINE HCL 1 % IJ SOLN
0.50 | INTRAMUSCULAR | Status: DC
Start: ? — End: 2018-06-06

## 2018-06-06 MED ORDER — TORSEMIDE 20 MG PO TABS
40.00 | ORAL_TABLET | ORAL | Status: DC
Start: 2018-06-06 — End: 2018-06-06

## 2019-01-23 IMAGING — DX DG ABDOMEN 1V
2 series · 2 of 2 positions shown · non-contrast
Comparison: 05/04/2018

CLINICAL DATA: Right nephrostomy.

EXAM:
ABDOMEN - 1 VIEW

[abdomen supine (1 of 2)]
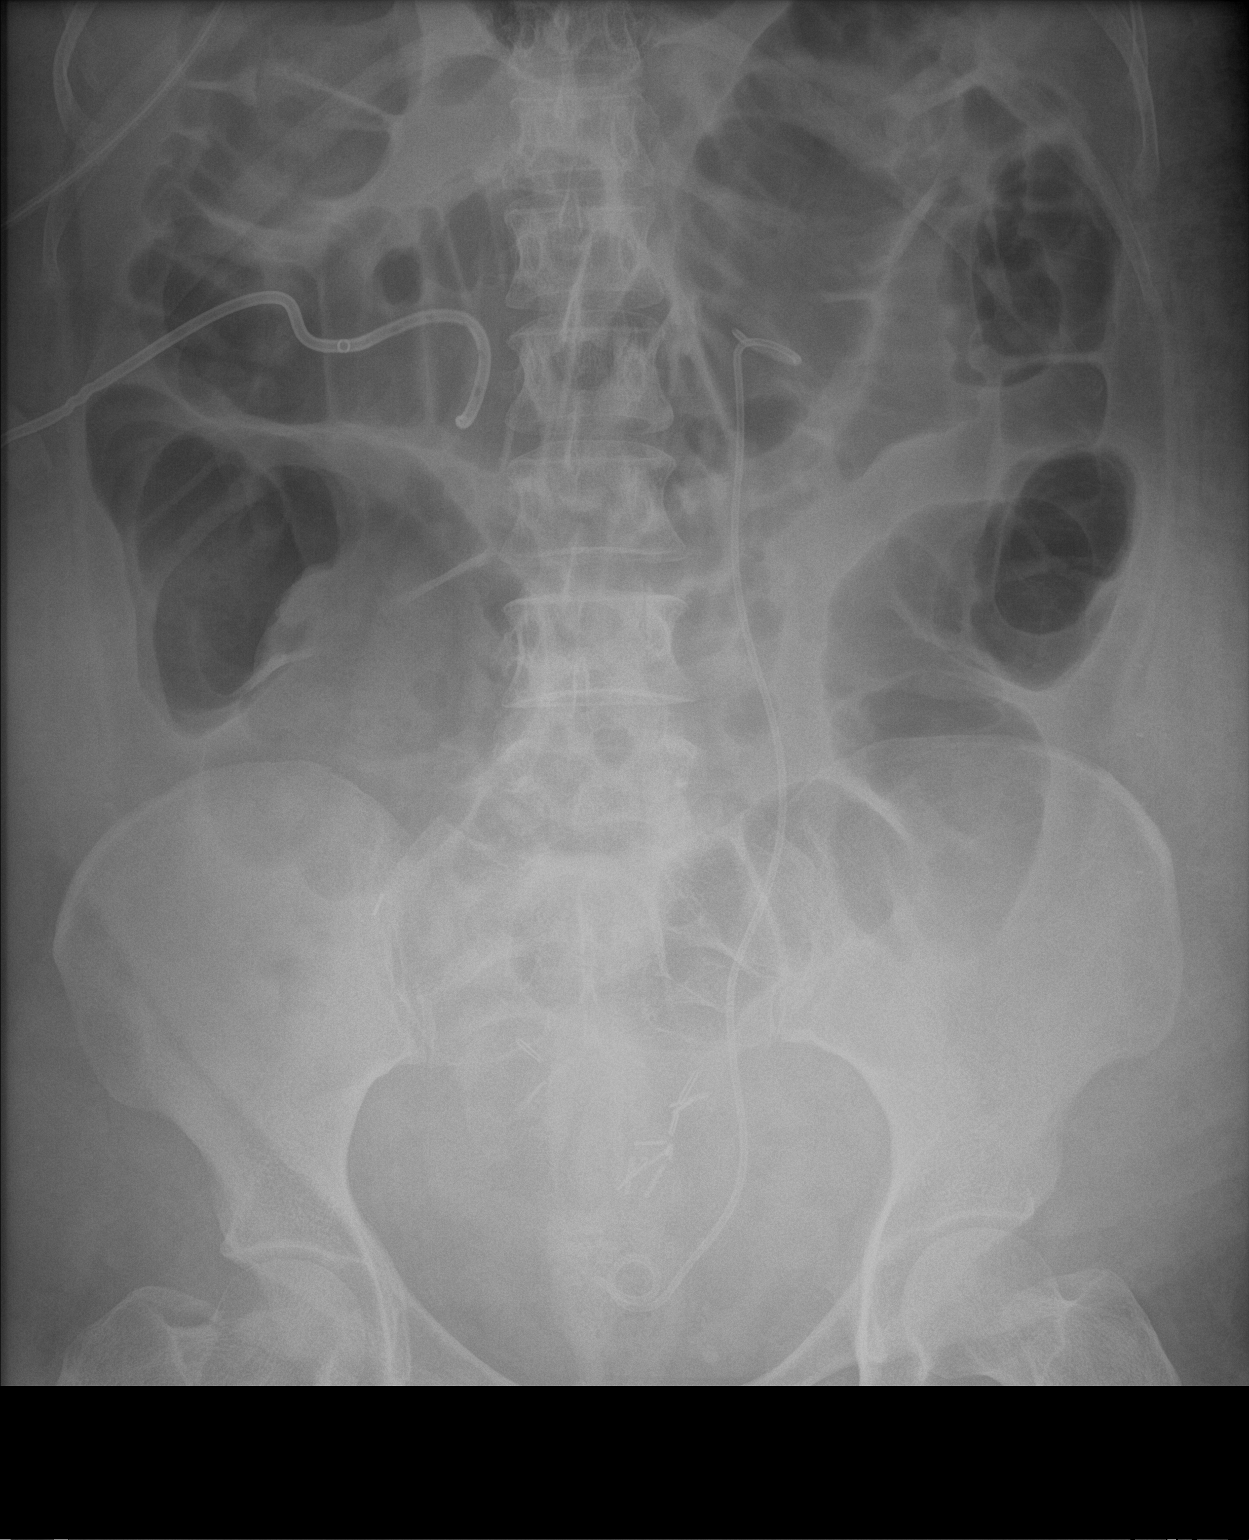

[abdomen supine (2 of 2)]
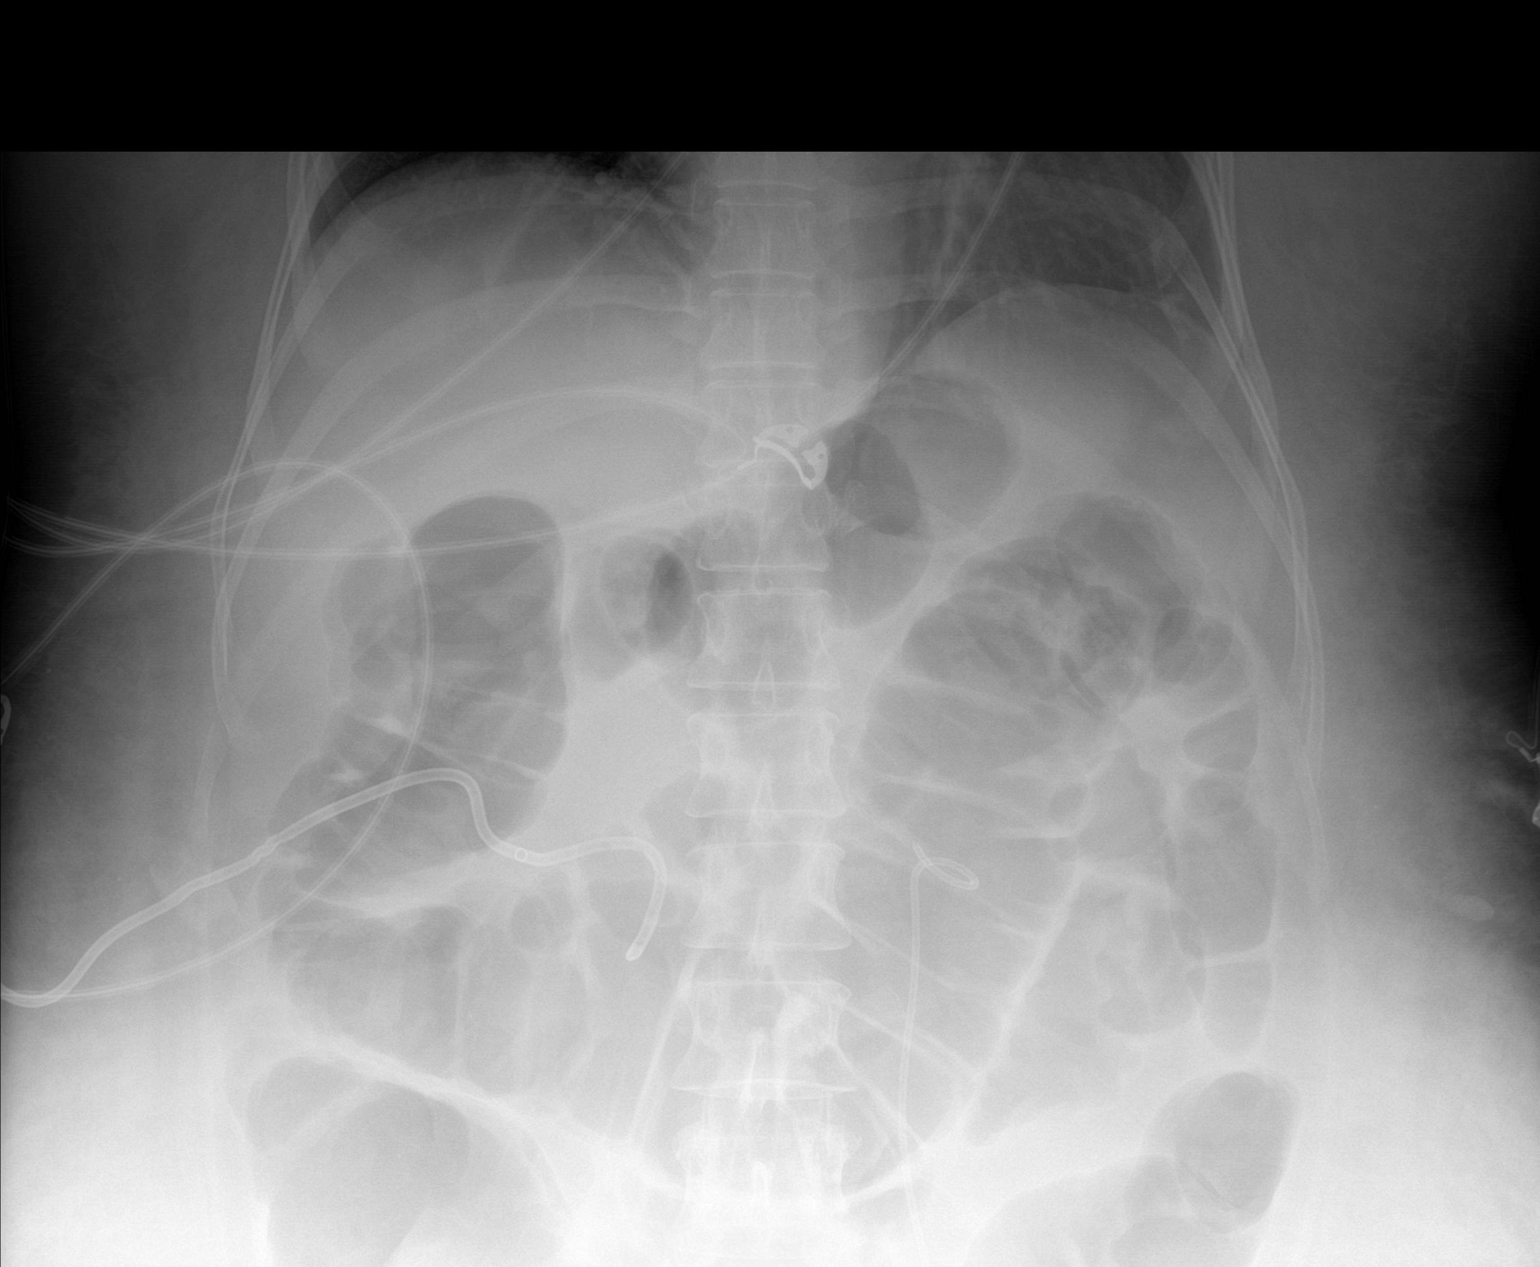

[2 of 2 positions shown; findings below may reference images not displayed]

FINDINGS: Right nephrostomy catheter and left ureteral stent remain in place,
unchanged. Diffuse gaseous distention of bowel, likely ileus,
unchanged. Interval removal of NG tube. No free air or organomegaly.
IMPRESSION: Diffuse gaseous distention of bowel most compatible with ileus,
unchanged.

## 2019-01-26 IMAGING — CR DG CHEST 2V
2 series · 2 of 2 positions shown · non-contrast
Comparison: 05/03/2018

CLINICAL DATA: Weakness, retaining fluids, leukocytosis, had
ureteroscopy and central line placement today

EXAM:
CHEST - 2 VIEW

[chest lat]
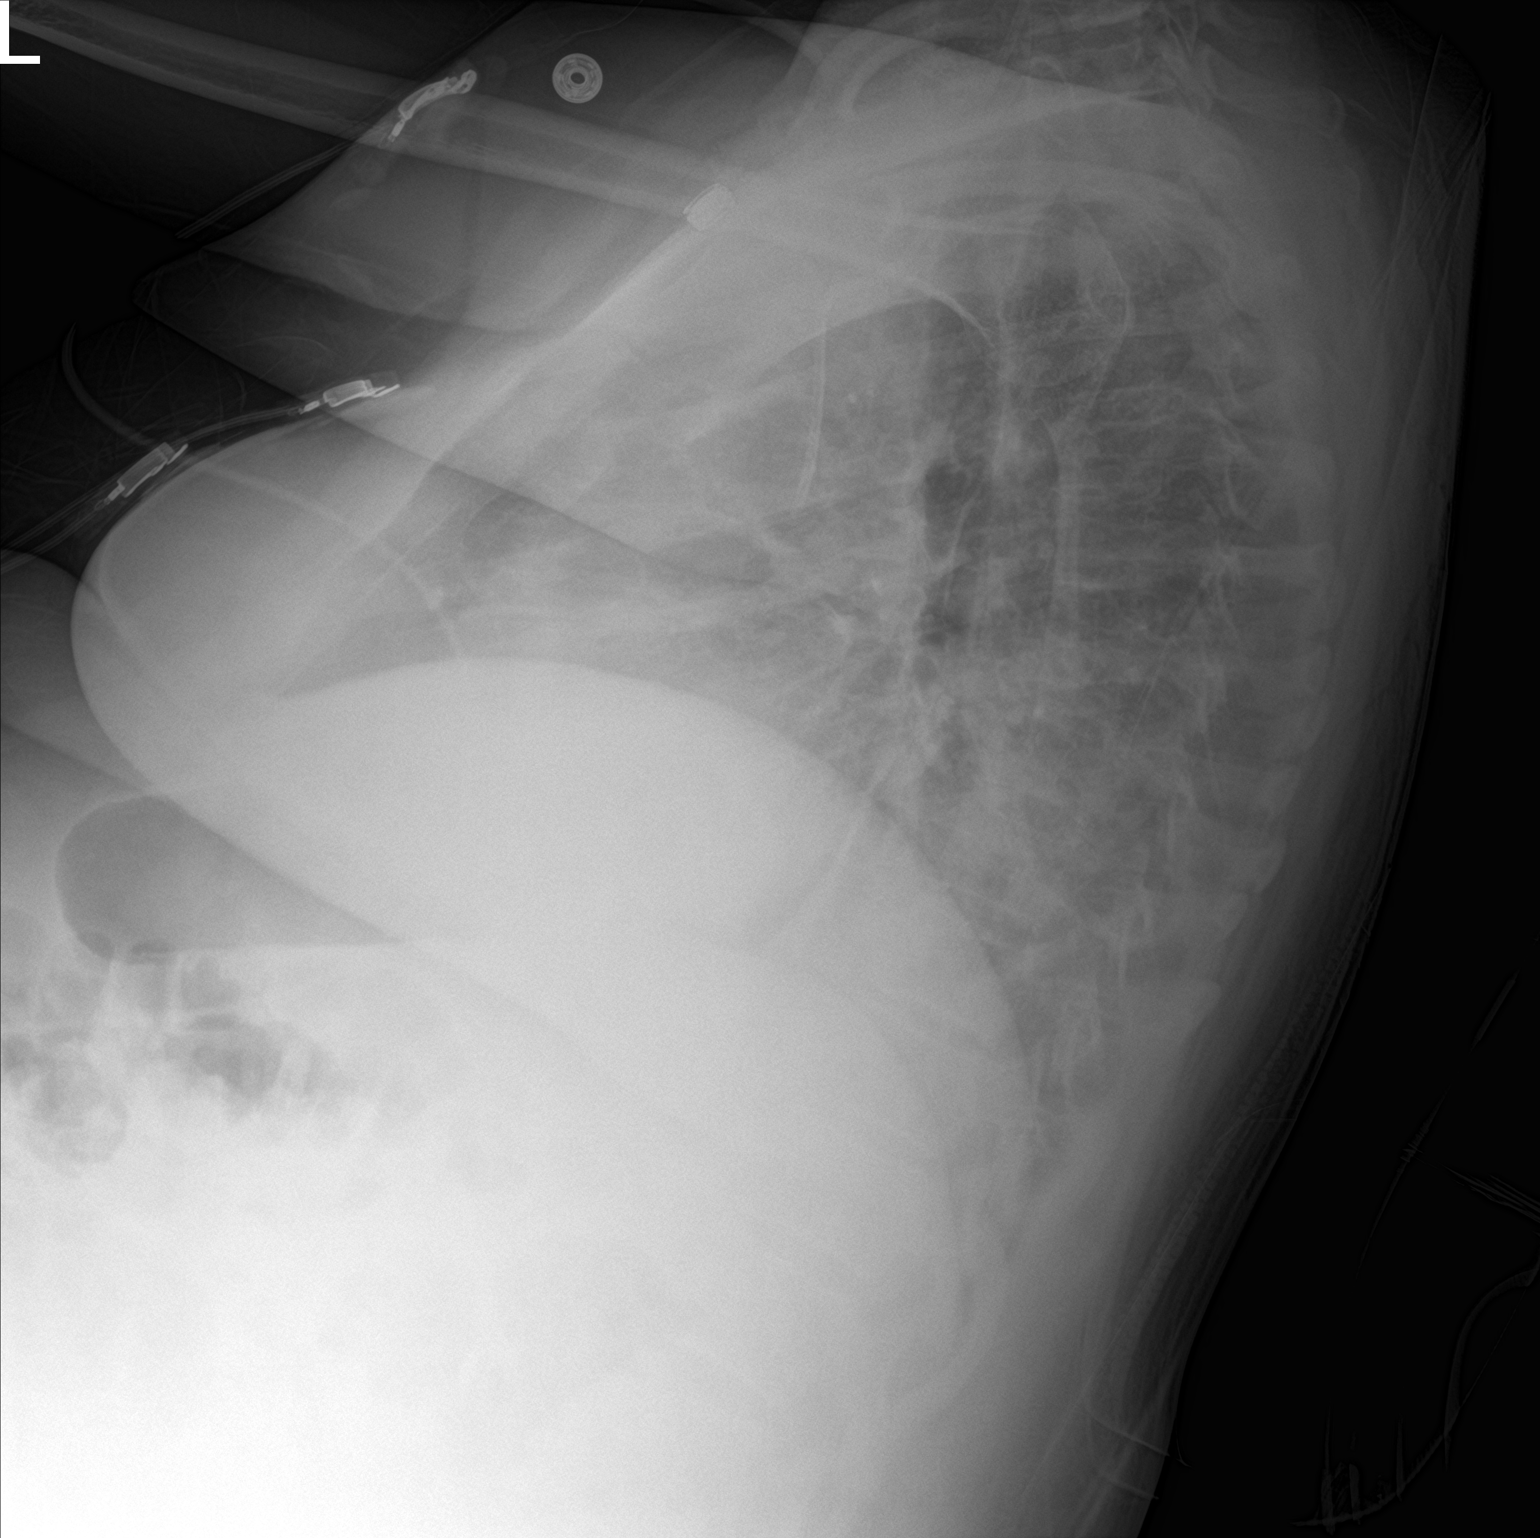

[chest ap]
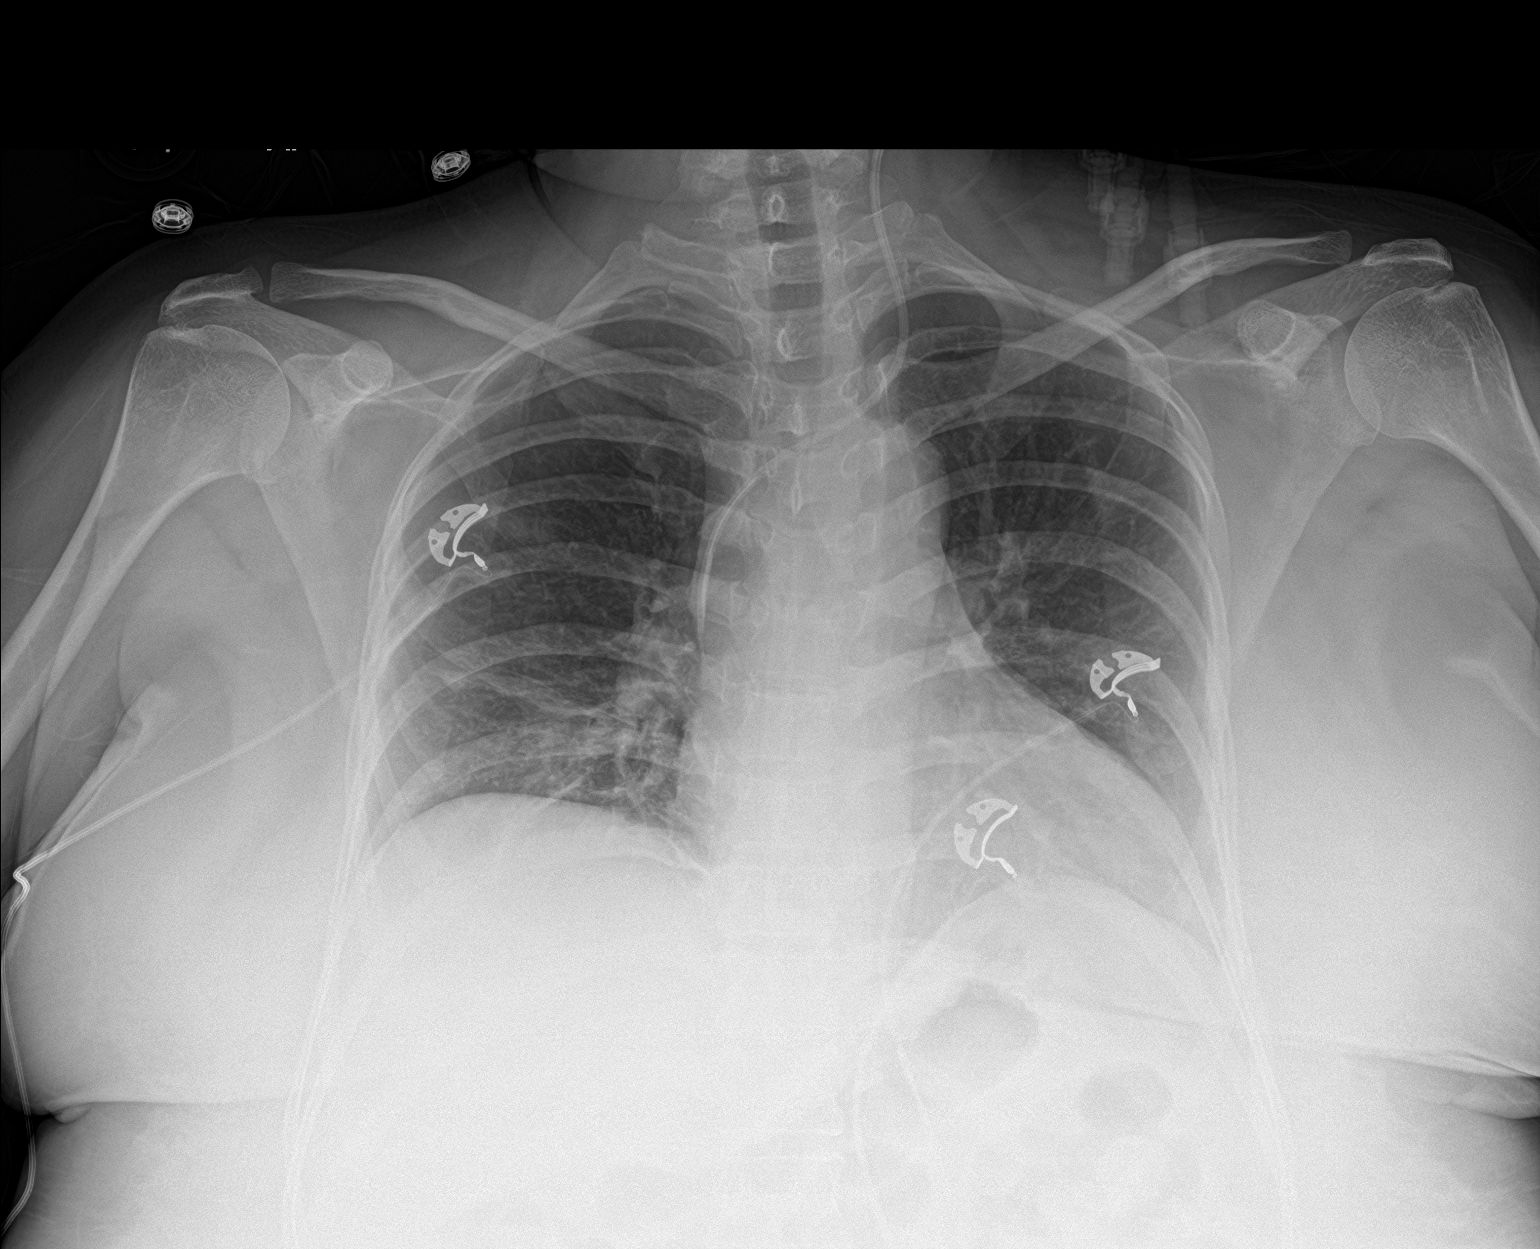

[2 of 2 positions shown; findings below may reference images not displayed]

FINDINGS: LEFT jugular central venous catheter with tip projecting over SVC.

Borderline enlargement of cardiac silhouette.

Mediastinal contours and pulmonary vascularity normal.

Persistent RIGHT basilar atelectasis.

Lungs otherwise clear.

No pulmonary infiltrate, pleural effusion or pneumothorax.

Bones unremarkable.
IMPRESSION: Persistent RIGHT basilar atelectasis.

## 2019-02-02 IMAGING — US US RENAL
1 series · 14 of 25 positions shown · non-contrast
Comparison: CTA of the abdomen and pelvis 05/11/2018. Renal
ultrasound 05/06/2018.

CLINICAL DATA: Acute kidney failure, unspecified.

EXAM:
RENAL / URINARY TRACT ULTRASOUND COMPLETE

[Series 1: us renal · 14 of 44 slices shown]
[im 1/44]
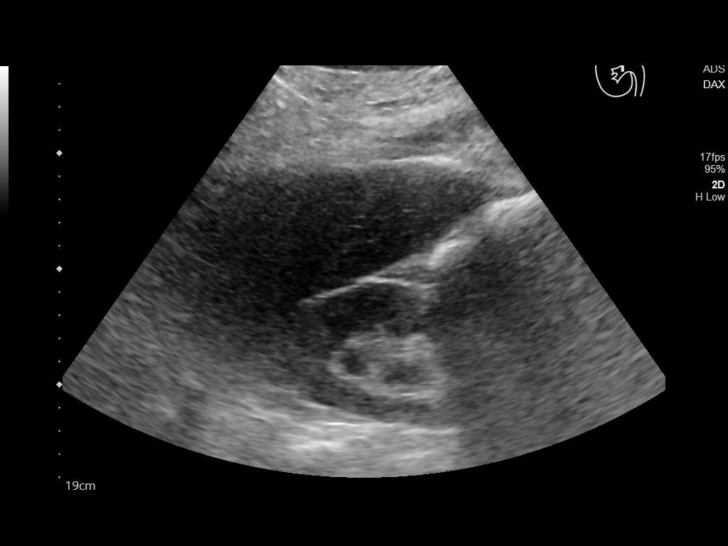
[im 4/44]
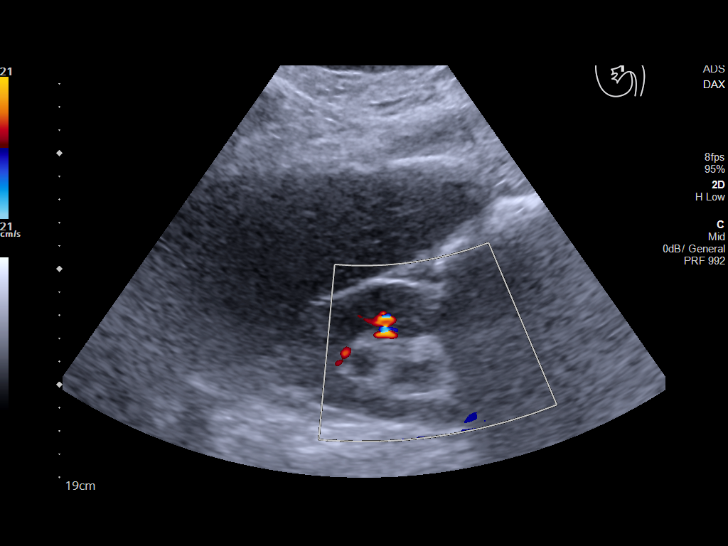
[im 8/44]
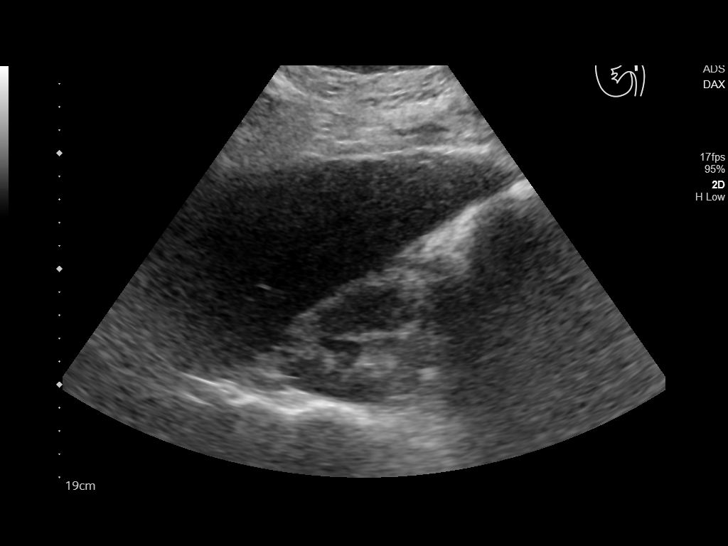
[im 11/44]
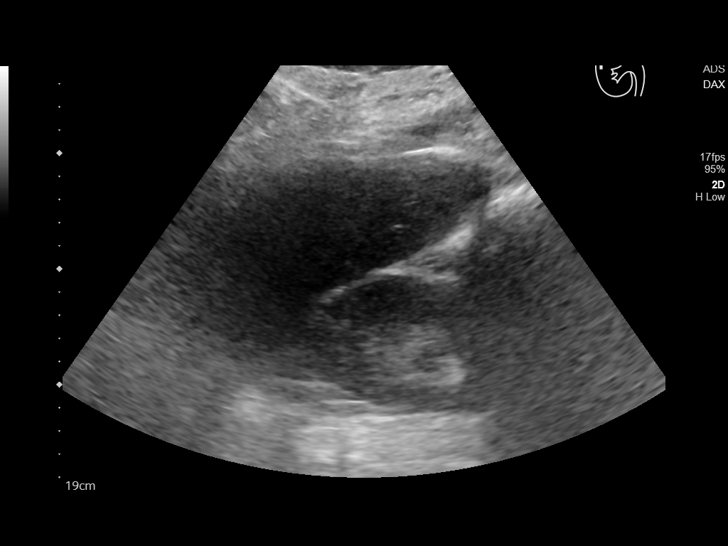
[im 15/44]
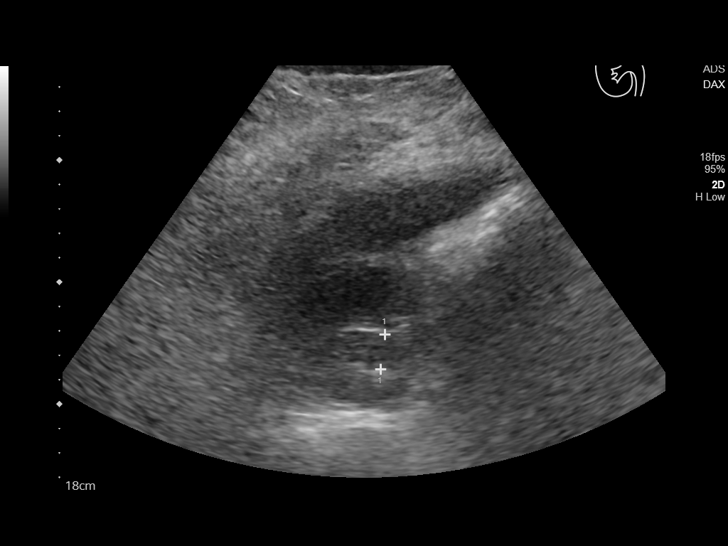
[im 17/44]
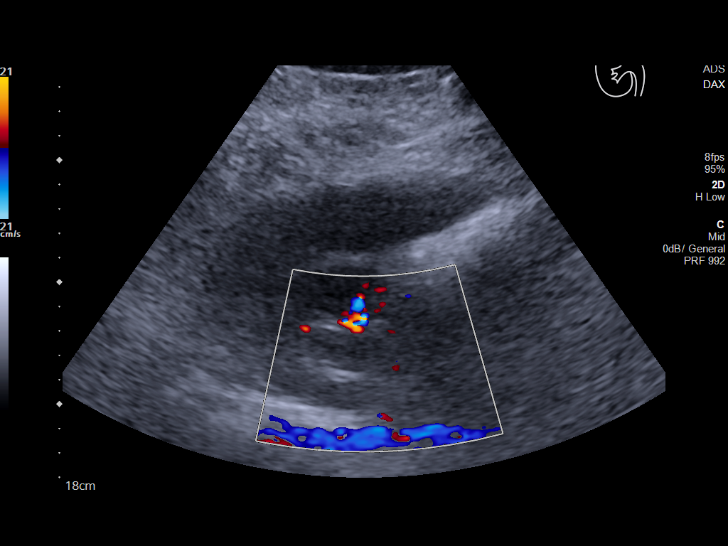
[im 20/44]
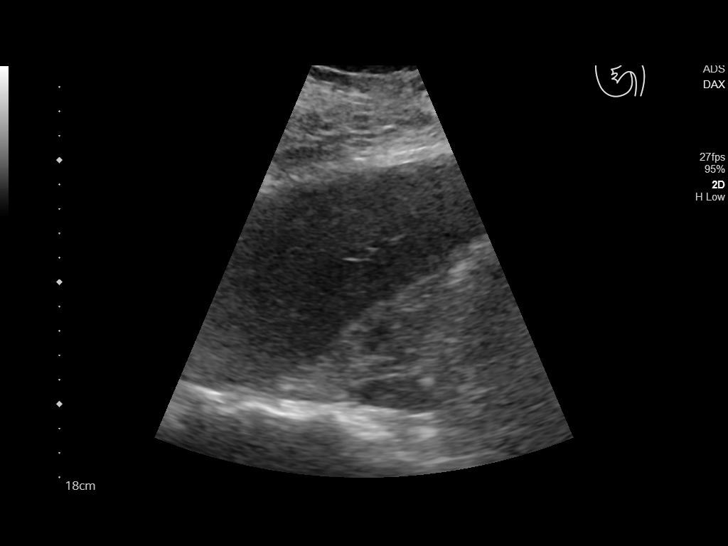
[im 24/44]
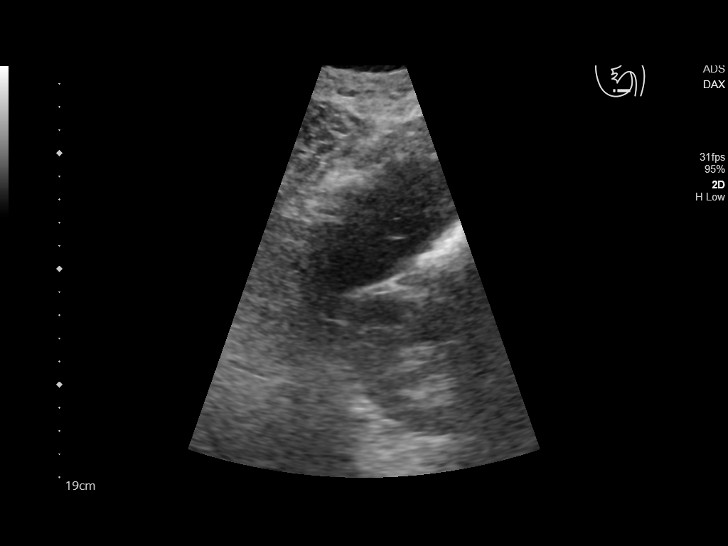
[im 27/44]
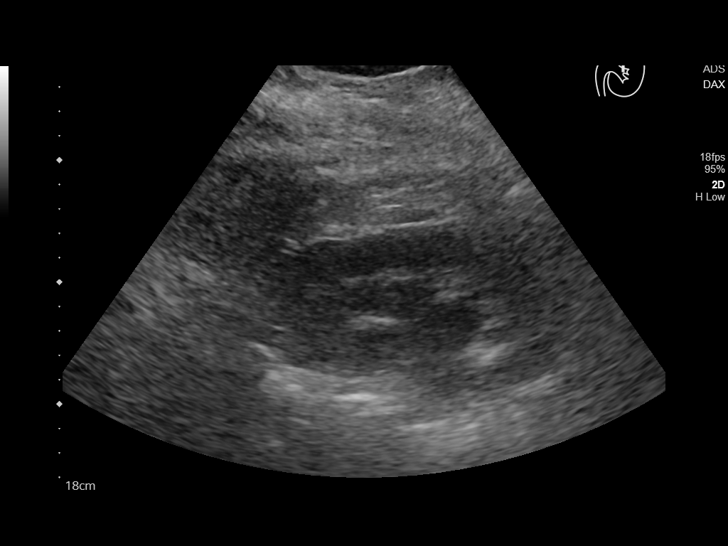
[im 29/44]
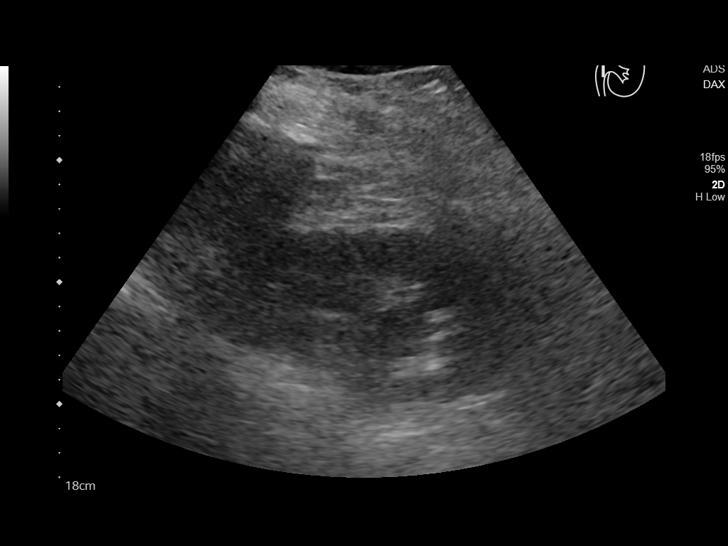
[im 33/44]
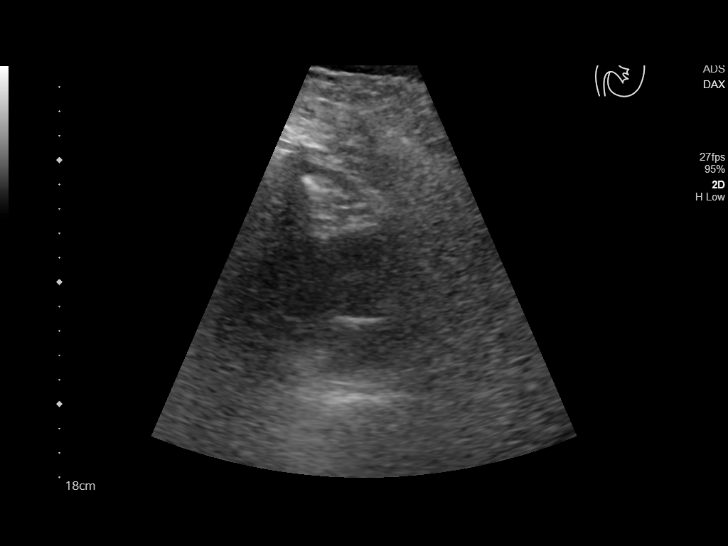
[im 36/44]
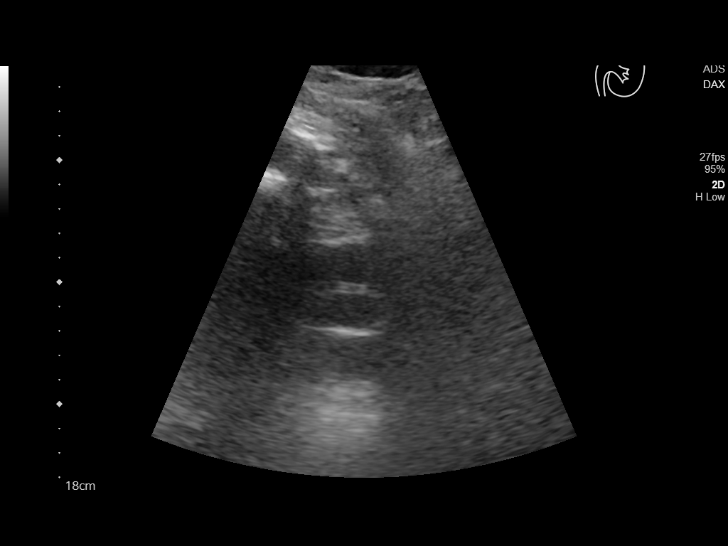
[im 40/44]
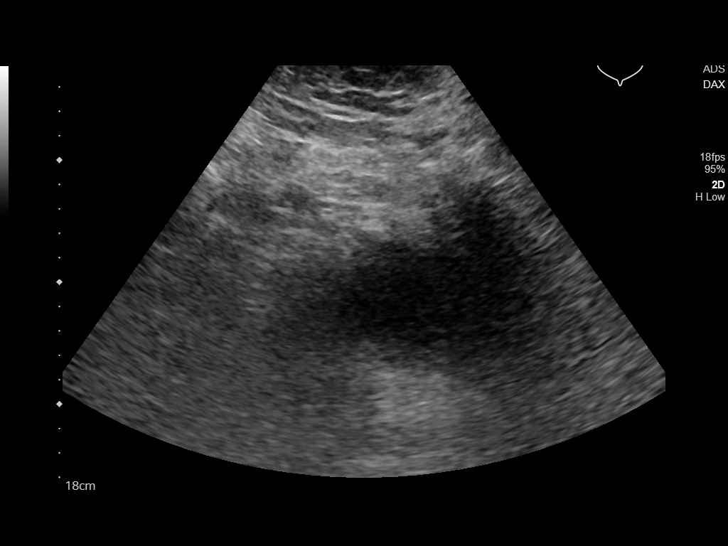
[im 44/44]
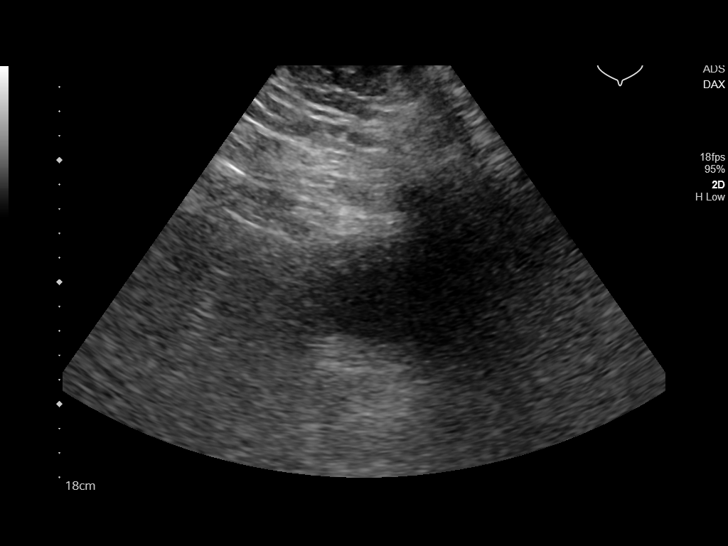

[14 of 25 positions shown; findings below may reference images not displayed]

FINDINGS: Right Kidney:

Length: 10.0 cm within limits. Renal parenchyma is within normal
limits. No discrete lesions are seen. Moderate hydronephrosis is
present. No obstructing mass lesion is present.

Left Kidney:

Length: 11.7 cm, within normal limits. Renal parenchyma is within
normal limits. No discrete lesions are seen. Moderate hydronephrosis
is present. No obstructing mass lesion is present.

Bladder:

Not well visualized.
IMPRESSION: 1. Moderate bilateral hydronephrosis remains.
2. No parenchymal lesions.

## 2019-08-14 ENCOUNTER — Encounter: Payer: BC Managed Care – PPO | Attending: Internal Medicine | Admitting: Internal Medicine

## 2019-08-14 ENCOUNTER — Other Ambulatory Visit: Payer: Self-pay

## 2019-08-14 DIAGNOSIS — L97822 Non-pressure chronic ulcer of other part of left lower leg with fat layer exposed: Secondary | ICD-10-CM | POA: Insufficient documentation

## 2019-08-14 DIAGNOSIS — Z86718 Personal history of other venous thrombosis and embolism: Secondary | ICD-10-CM | POA: Diagnosis not present

## 2019-08-14 DIAGNOSIS — D6869 Other thrombophilia: Secondary | ICD-10-CM | POA: Diagnosis not present

## 2019-08-14 DIAGNOSIS — Z7901 Long term (current) use of anticoagulants: Secondary | ICD-10-CM | POA: Insufficient documentation

## 2019-08-14 DIAGNOSIS — I87312 Chronic venous hypertension (idiopathic) with ulcer of left lower extremity: Secondary | ICD-10-CM | POA: Diagnosis not present

## 2019-08-14 NOTE — Progress Notes (Signed)
LESLEY, ATKIN ([id number]) Visit Report for 08/14/2019 Abuse/Suicide Risk Screen Details Patient Name: Barcellos, Keia B. Date of Service: 08/14/2019 2:15 PM Medical Record Number: [id number] Patient Account Number: [id number] Date of Birth/Sex: Nov 18, 1976 (43 y.o. F) Treating RN: Rodell Perna Primary Care Channon Brougher: Marcelino Duster Other Clinician: Referring Toshio Slusher: Marcelino Duster Treating Aziza Stuckert/Extender: Altamese [place] in Treatment: 0 Abuse/Suicide Risk Screen Items Answer ABUSE RISK SCREEN: Has anyone close to you tried to hurt or harm you recentlyo No Do you feel uncomfortable with anyone in your familyo No Has anyone forced you do things that you didnot want to doo No Electronic Signature(s) Signed: 08/14/2019 4:11:15 PM By: Rodell Perna [name]: Rodell Perna on 08/14/2019 14:31:20 Hammerschmidt, [name] ([id number]) -------------------------------------------------------------------------------- Activities of Daily Living Details Patient Name: Macera, Alexandra B. Date of Service: 08/14/2019 2:15 PM Medical Record Number: [id number] Patient Account Number: [id number] Date of Birth/Sex: 1976/07/18 (43 y.o. F) Treating RN: Rodell Perna Primary Care Tyneisha Hegeman: Marcelino Duster Other Clinician: Referring Aliana Kreischer: Marcelino Duster Treating Sherrice Creekmore/Extender: Altamese [place] in Treatment: 0 Activities of Daily Living Items Answer Activities of Daily Living (Please select one for each item) Drive Automobile Completely Able Take Medications Completely Able Use Telephone Completely Able Care for Appearance Completely Able Use Toilet Completely Able Bath / Shower Completely Able Dress Self Completely Able Feed Self Completely Able Walk Completely Able Get In / Out Bed Completely Able Housework Completely Able Prepare Meals Completely Able Handle Money Completely Able Shop for Self Completely Able Electronic Signature(s) Signed: 08/14/2019 4:11:15 PM By: Rodell Perna [name]: Rodell Perna on 08/14/2019 14:31:30 Schou, [name] ([id number]) -------------------------------------------------------------------------------- Education Screening Details Patient Name: Haglund, Russell B. Date of Service: 08/14/2019 2:15 PM Medical Record Number: [id number] Patient Account Number: [id number] Date of Birth/Sex: [birth year] (43 y.o. F) Treating RN: Rodell Perna Primary Care Veto Macqueen: Marcelino Duster Other Clinician: Referring Brady Plant: Marcelino Duster Treating Mana Haberl/Extender: Altamese [place] in Treatment: 0 Learning Preferences/Education Level/Primary Language Learning Preference: Explanation, Demonstration Highest Education Level: High School Preferred Language: English Cognitive Barrier Language Barrier: No Translator Needed: No Memory Deficit: No Emotional Barrier: No Cultural/Religious Beliefs Affecting Medical Care: No Physical Barrier Impaired Vision: No Impaired Hearing: No Decreased Hand dexterity: No Knowledge/Comprehension Knowledge Level: High Comprehension Level: High Ability to understand written High instructions: Ability to understand verbal High instructions: Motivation Anxiety Level: Calm Cooperation: Cooperative Education Importance: Acknowledges Need Interest in Health Problems: Asks Questions Perception: Coherent Willingness to Engage in Self- High Management Activities: Readiness to Engage in Self- High Management Activities: Electronic Signature(s) Signed: 08/14/2019 4:11:15 PM By: Rodell Perna [name]: Rodell Perna on 08/14/2019 14:31:51 Janek, [name] ([id number]) -------------------------------------------------------------------------------- Fall Risk Assessment Details Patient Name: Divelbiss, Dashanna B. Date of Service: 08/14/2019 2:15 PM Medical Record Number: [id number] Patient Account Number: [id number] Date of Birth/Sex: [birth year] (43 y.o. F) Treating RN: Rodell Perna Primary Care Hansen Carino:  Marcelino Duster Other Clinician: Referring Mariene Dickerman: Marcelino Duster Treating Eluterio Seymour/Extender: Altamese [place] in Treatment: 0 Fall Risk Assessment Items Have you had 2 or more falls in the last 12 monthso 0 No Have you had any fall that resulted in injury in the last 12 monthso 0 No FALLS RISK SCREEN History of falling - immediate or within 3 months 0 No Secondary diagnosis (Do you have 2 or more medical diagnoseso) 0 No Ambulatory aid None/bed rest/wheelchair/nurse 0 No Crutches/cane/walker 0 No Furniture 0 No Intravenous therapy Access/Saline/Heparin Lock 0 No Gait/Transferring Normal/ bed rest/ wheelchair 0 No Weak (short steps with or  without shuffle, stooped but able to lift head while 0 No walking, may seek support from furniture) Impaired (short steps with shuffle, may have difficulty arising from chair, head 0 No down, impaired balance) Mental Status Oriented to own ability 0 No Electronic Signature(s) Signed: 08/14/2019 4:11:15 PM By: Army Melia [name]: Army Melia on 08/14/2019 14:31:56 Levesque, [name] ([id number]) -------------------------------------------------------------------------------- Foot Assessment Details Patient Name: Kartes, Adalaide B. Date of Service: 08/14/2019 2:15 PM Medical Record Number: [id number] Patient Account Number: [id number] Date of Birth/Sex: 04-19-1976 (43 y.o. F) Treating RN: Army Melia Primary Care Ladrea Holladay: Harrel Lemon Other Clinician: Referring Leighanne Adolph: Harrel Lemon Treating Shanora Christensen/Extender: Tito Dine in Treatment: 0 Foot Assessment Items Site Locations + = Sensation present, - = Sensation absent, C = Callus, U = Ulcer R = Redness, W = Warmth, M = Maceration, PU = Pre-ulcerative lesion F = Fissure, S = Swelling, D = Dryness Assessment Right: Left: Other Deformity: No No Prior Foot Ulcer: No No Prior Amputation: No No Charcot Joint: No No Ambulatory Status: Ambulatory Without Help Gait:  Steady Electronic Signature(s) Signed: 08/14/2019 4:11:15 PM By: Army Melia [name]: Army Melia on 08/14/2019 14:32:21 Girgenti, [name] ([id number]) -------------------------------------------------------------------------------- Nutrition Risk Screening Details Patient Name: Meas, Ebonique B. Date of Service: 08/14/2019 2:15 PM Medical Record Number: [id number] Patient Account Number: [id number] Date of Birth/Sex: August 23, 1976 (43 y.o. F) Treating RN: Army Melia Primary Care Adeola Dennen: Harrel Lemon Other Clinician: Referring Arelie Kuzel: Harrel Lemon Treating Marta Bouie/Extender: Tito Dine in Treatment: 0 Height (in): 60 Weight (lbs): 204 Body Mass Index (BMI): 39.8 Nutrition Risk Screening Items Score Screening NUTRITION RISK SCREEN: I have an illness or condition that made me change the kind and/or amount of 0 No food I eat I eat fewer than two meals per day 0 No I eat few fruits and vegetables, or milk products 0 No I have three or more drinks of beer, liquor or wine almost every day 0 No I have tooth or mouth problems that make it hard for me to eat 0 No I don't always have enough money to buy the food I need 0 No I eat alone most of the time 0 No I take three or more different prescribed or over-the-counter drugs a day 0 No Without wanting to, I have lost or gained 10 pounds in the last six months 0 No I am not always physically able to shop, cook and/or feed myself 0 No Nutrition Protocols Good Risk Protocol 0 No interventions needed Moderate Risk Protocol High Risk Proctocol Risk Level: Good Risk Score: 0 Electronic Signature(s) Signed: 08/14/2019 4:11:15 PM By: Army Melia [name]: Army Melia on 08/14/2019 14:32:01

## 2019-08-15 NOTE — Progress Notes (Addendum)
[name], Jerzee B. ([id number]) Visit Report for 08/14/2019 Allergy List Details Patient Name: Hellums, Dejai B. Date of Service: 08/14/2019 2:15 PM Medical Record Number: [id number] Patient Account Number: [id number] Date of Birth/Sex: 03/07/1976 (43 y.o. F) Treating RN: Rodell PernaScott, Dajea Primary Care Panayiotis [place]: Marcelino DusterJOHNSTON, JOHN Other Clinician: Referring Maxson Oddo: Marcelino DusterJOHNSTON, JOHN Treating Clemons Salvucci/Extender: Maxwell CaulOBSON, MICHAEL G Weeks in Treatment: 0 Allergies Active Allergies No Known Allergies Allergy Notes Electronic Signature(s) Signed: 08/14/2019 4:11:15 PM By: Rodell PernaScott, Dajea [name]: Rodell PernaScott, Dajea on 08/14/2019 14:26:13 Asa, [name] ([id number]) -------------------------------------------------------------------------------- Arrival Information Details Patient Name: Reha, Cash B. Date of Service: 08/14/2019 2:15 PM Medical Record Number: [id number] Patient Account Number: [id number] Date of Birth/Sex: 05/22/1976 (43 y.o. F) Treating RN: Rodell PernaScott, Dajea Primary Care Lief Palmatier: Marcelino DusterJOHNSTON, JOHN Other Clinician: Referring Vickki Igou: Marcelino DusterJOHNSTON, JOHN Treating Pasqual Farias/Extender: Altamese CarolinaOBSON, MICHAEL G Weeks in Treatment: 0 Visit Information Patient Arrived: Ambulatory Arrival Time: 14:20 Accompanied By: husband Transfer Assistance: None Patient Identification Verified: Yes Patient Has Alerts: Yes Patient Alerts: Patient on Blood Thinner Xarelto Notes xerelto and aspirin 81 mg Electronic Signature(s) Signed: 08/14/2019 3:00:23 PM By: Curtis Sitesorthy, Joanna [name]: Curtis Sitesorthy, Joanna on 08/14/2019 15:00:23 Demedeiros, [name] ([id number]) -------------------------------------------------------------------------------- Clinic Level of Care Assessment Details Patient Name: Renken, Odeal B. Date of Service: 08/14/2019 2:15 PM Medical Record Number: [id number] Patient Account Number: [id number] Date of Birth/Sex: 09/22/1976 (43 y.o. F) Treating RN: Huel CoventryWoody, Kim Primary Care Doxie Augenstein: Marcelino DusterJOHNSTON, JOHN Other  Clinician: Referring Cully Luckow: Marcelino DusterJOHNSTON, JOHN Treating Blaike Vickers/Extender: Altamese CarolinaOBSON, MICHAEL G Weeks in Treatment: 0 Clinic Level of Care Assessment Items TOOL 1 Quantity Score []  - Use when EandM and Procedure is performed on INITIAL visit 0 ASSESSMENTS - Nursing Assessment / Reassessment X - General Physical Exam (combine w/ comprehensive assessment (listed just below) when 1 20 performed on new pt. evals) X- 1 25 Comprehensive Assessment (HX, ROS, Risk Assessments, Wounds Hx, etc.) ASSESSMENTS - Wound and Skin Assessment / Reassessment []  - Dermatologic / Skin Assessment (not related to wound area) 0 ASSESSMENTS - Ostomy and/or Continence Assessment and Care []  - Incontinence Assessment and Management 0 []  - 0 Ostomy Care Assessment and Management (repouching, etc.) PROCESS - Coordination of Care X - Simple Patient / Family Education for ongoing care 1 15 []  - 0 Complex (extensive) Patient / Family Education for ongoing care X- 1 10 Staff obtains ChiropractorConsents, Records, Test Results / Process Orders []  - 0 Staff telephones HHA, Nursing Homes / Clarify orders / etc []  - 0 Routine Transfer to another Facility (non-emergent condition) []  - 0 Routine Hospital Admission (non-emergent condition) X- 1 15 New Admissions / Manufacturing engineernsurance Authorizations / Ordering NPWT, Apligraf, etc. []  - 0 Emergency Hospital Admission (emergent condition) PROCESS - Special Needs []  - Pediatric / Minor Patient Management 0 []  - 0 Isolation Patient Management []  - 0 Hearing / Language / Visual special needs []  - 0 Assessment of Community assistance (transportation, D/C planning, etc.) []  - 0 Additional assistance / Altered mentation []  - 0 Support Surface(s) Assessment (bed, cushion, seat, etc.) Gabrys, Cataleyah B. ([id number]) INTERVENTIONS - Miscellaneous []  - External ear exam 0 []  - 0 Patient Transfer (multiple staff / Nurse, adultHoyer Lift / Similar devices) []  - 0 Simple Staple / Suture removal (25 or  less) []  - 0 Complex Staple / Suture removal (26 or more) []  - 0 Hypo/Hyperglycemic Management (do not check if billed separately) X- 1 15 Ankle / Brachial Index (ABI) - do not check if billed separately Has the patient been seen at the hospital within the last three years:  Yes Total Score: 100 Level Of Care: New/Established - Level 3 Electronic Signature(s) Signed: 08/14/2019 5:53:54 PM By: Elliot Gurney, BSN, RN, CWS, Kim RN, BSN [name]: Elliot Gurney, BSN, RN, CWS, Kim on 08/14/2019 15:04:42 Kurka, [name] ([id number]) -------------------------------------------------------------------------------- Encounter Discharge Information Details Patient Name: Dejager, Virjean B. Date of Service: 08/14/2019 2:15 PM Medical Record Number: [id number] Patient Account Number: [id number] Date of Birth/Sex: 1976-08-20 (43 y.o. F) Treating RN: Huel Coventry Primary Care Emersen Carroll: Marcelino Duster Other Clinician: Referring Hutchinson Isenberg: Marcelino Duster Treating Jamel Holzmann/Extender: Altamese [place] in Treatment: 0 Encounter Discharge Information Items Post Procedure Vitals Discharge Condition: Stable Temperature (F): 98.2 Ambulatory Status: Ambulatory Pulse (bpm): 59 Discharge Destination: Home Respiratory Rate (breaths/min): 16 Transportation: Private Auto Blood Pressure (mmHg): 125/76 Accompanied By: husband Schedule Follow-up Appointment: Yes Clinical Summary of Care: Electronic Signature(s) Signed: 08/14/2019 5:53:54 PM By: Elliot Gurney, BSN, RN, CWS, Kim RN, BSN [name]: Elliot Gurney, BSN, RN, CWS, Kim on 08/14/2019 15:09:36 Shimada, [name] ([id number]) -------------------------------------------------------------------------------- Lower Extremity Assessment Details Patient Name: Pollard, Johan B. Date of Service: 08/14/2019 2:15 PM Medical Record Number: [id number] Patient Account Number: [id number] Date of Birth/Sex: 1976-08-23 (43 y.o. F) Treating RN: Rodell Perna Primary Care Ashleyann Shoun: Marcelino Duster Other  Clinician: Referring Azrael Huss: Marcelino Duster Treating Tallen Schnorr/Extender: Altamese [place] in Treatment: 0 Edema Assessment Assessed: [Left: No] [Right: No] Edema: [Left: Ye] [Right: s] Calf Left: Right: Point of Measurement: 32 cm From Medial Instep 42.5 cm cm Ankle Left: Right: Point of Measurement: 8 cm From Medial Instep 23 cm cm Vascular Assessment Pulses: Dorsalis Pedis Palpable: [Left:Yes] Doppler Audible: [Left:Yes] Posterior Tibial Palpable: [Left:Yes Yes] Notes non-compressible Electronic Signature(s) Signed: 08/14/2019 4:11:15 PM By: Rodell Perna [name]: Rodell Perna on 08/14/2019 14:33:51 Lecomte, Tarisha B. ([id number]) -------------------------------------------------------------------------------- Multi Wound Chart Details Patient Name: Bence, Dulcey B. Date of Service: 08/14/2019 2:15 PM Medical Record Number: [id number] Patient Account Number: [id number] Date of Birth/Sex: 05/05/1976 (43 y.o. F) Treating RN: Huel Coventry Primary Care Jodine Muchmore: Marcelino Duster Other Clinician: Referring Tanyah Debruyne: Marcelino Duster Treating Kambrie Eddleman/Extender: Altamese [place] in Treatment: 0 Vital Signs Height(in): 60 Pulse(bpm): 59 Weight(lbs): 204 Blood Pressure(mmHg): 125/76 Body Mass Index(BMI): 40 Temperature(F): 98.2 Respiratory Rate 16 (breaths/min): Photos: [N/A:N/A] Wound Location: Left Lower Leg - Anterior N/A N/A Wounding Event: Blister N/A N/A Primary Etiology: To be determined N/A N/A Date Acquired: 05/21/2018 N/A N/A Weeks of Treatment: 0 N/A N/A Wound Status: Open N/A N/A Measurements L x W x D 1.3x1x0.1 N/A N/A (cm) Area (cm) : 1.021 N/A N/A Volume (cm) : 0.102 N/A N/A Classification: Partial Thickness N/A N/A Exudate Amount: Medium N/A N/A Exudate Type: Serosanguineous N/A N/A Exudate Color: red, brown N/A N/A Granulation Amount: None Present (0%) N/A N/A Necrotic Amount: Large (67-100%) N/A N/A Necrotic Tissue: Eschar N/A  N/A Exposed Structures: Fat Layer (Subcutaneous N/A N/A Tissue) Exposed: Yes Fascia: No Tendon: No Muscle: No Joint: No Bone: No Epithelialization: None N/A N/A Treatment Notes Iacobucci, LYNDI HOLBEIN ([id number]) Electronic Signature(s) Signed: 08/14/2019 5:53:54 PM By: Elliot Gurney, BSN, RN, CWS, Kim RN, BSN [name]: Elliot Gurney, BSN, RN, CWS, Kim on 08/14/2019 14:54:33 Catlin, [name] ([id number]) -------------------------------------------------------------------------------- Multi-Disciplinary Care Plan Details Patient Name: Leinweber, Byanka B. Date of Service: 08/14/2019 2:15 PM Medical Record Number: [id number] Patient Account Number: [id number] Date of Birth/Sex: 06/26/1976 (43 y.o. F) Treating RN: Huel Coventry Primary Care Cort Dragoo: Marcelino Duster Other Clinician: Referring Eloyse Causey: Marcelino Duster Treating Armani Brar/Extender: Altamese [place] in Treatment: 0 Active Inactive Necrotic Tissue Nursing Diagnoses: Impaired tissue integrity related to  necrotic/devitalized tissue Goals: Necrotic/devitalized tissue will be minimized in the wound bed Date Initiated: 08/14/2019 Target Resolution Date: 08/21/2019 Goal Status: Active Interventions: Assess patient pain level pre-, during and post procedure and prior to discharge Treatment Activities: Apply topical anesthetic as ordered : 08/14/2019 Notes: Orientation to the Wound Care Program Nursing Diagnoses: Knowledge deficit related to the wound healing center program Goals: Patient/caregiver will verbalize understanding of the Wound Healing Center Program Date Initiated: 08/14/2019 Target Resolution Date: 08/21/2019 Goal Status: Active Interventions: Provide education on orientation to the wound center Notes: Wound/Skin Impairment Nursing Diagnoses: Impaired tissue integrity Goals: Ulcer/skin breakdown will have a volume reduction of 30% by week 4 Date Initiated: 08/14/2019 Target Resolution Date: 09/11/2019 Goal Status:  Active Cantrell, GHADA ABBETT ([id number]) Interventions: Assess ulceration(s) every visit Treatment Activities: Referred to DME Katrianna Friesenhahn for dressing supplies : 08/14/2019 Notes: Electronic Signature(s) Signed: 08/14/2019 5:53:54 PM By: Elliot Gurney, BSN, RN, CWS, Kim RN, BSN [name]: Elliot Gurney, BSN, RN, CWS, Kim on 08/14/2019 14:53:08 Tomlin, [name] ([id number]) -------------------------------------------------------------------------------- Pain Assessment Details Patient Name: Chew, Shamina B. Date of Service: 08/14/2019 2:15 PM Medical Record Number: [id number] Patient Account Number: [id number] Date of Birth/Sex: 1976-04-03 (43 y.o. F) Treating RN: Rodell Perna Primary Care Dua Mehler: Marcelino Duster Other Clinician: Referring Rehanna Oloughlin: Marcelino Duster Treating Taryn Shellhammer/Extender: Altamese [place] in Treatment: 0 Active Problems Location of Pain Severity and Description of Pain Patient Has Paino Yes Site Locations Rate the pain. Current Pain Level: 6 Pain Management and Medication Current Pain Management: Electronic Signature(s) Signed: 08/14/2019 4:11:15 PM By: Rodell Perna [name]: Rodell Perna on 08/14/2019 14:21:21 Atlas, [name] ([id number]) -------------------------------------------------------------------------------- Patient/Caregiver Education Details Patient Name: Placzek, Babygirl B. Date of Service: 08/14/2019 2:15 PM Medical Record Number: [id number] Patient Account Number: [id number] Date of Birth/Gender: 08/08/1976 (43 y.o. F) Treating RN: Huel Coventry Primary Care Physician: Marcelino Duster Other Clinician: Referring Physician: Marcelino Duster Treating Physician/Extender: Altamese [place] in Treatment: 0 Education Assessment Education Provided To: Patient Education Topics Provided Venous: Handouts: Controlling Swelling with Multilayered Compression Wraps Methods: Demonstration, Explain/Verbal Responses: State content correctly Welcome To The Wound Care  Center: Handouts: Welcome To The Wound Care Center Methods: Demonstration, Explain/Verbal Responses: State content correctly Wound/Skin Impairment: Handouts: Caring for Your Ulcer Methods: Demonstration, Explain/Verbal Responses: State content correctly Electronic Signature(s) Signed: 08/14/2019 5:53:54 PM By: Elliot Gurney, BSN, RN, CWS, Kim RN, BSN [name]: Elliot Gurney, BSN, RN, CWS, Kim on 08/14/2019 15:05:20 Routzahn, [name] ([id number]) -------------------------------------------------------------------------------- Wound Assessment Details Patient Name: Sebek, Myrle B. Date of Service: 08/14/2019 2:15 PM Medical Record Number: [id number] Patient Account Number: [id number] Date of Birth/Sex: [birth year] (43 y.o. F) Treating RN: Rodell Perna Primary Care Ivanna Kocak: Marcelino Duster Other Clinician: Referring Malvika Tung: Marcelino Duster Treating Darian Ace/Extender: Altamese  in Treatment: 0 Wound Status Wound Number: 1 Primary Etiology: Venous Leg Ulcer Wound Location: Left Lower Leg - Anterior Wound Status: Open Wounding Event: Blister Date Acquired: 05/21/2018 Weeks Of Treatment: 0 Clustered Wound: No Photos Wound Measurements Length: (cm) 1.3 % Reduction Width: (cm) 1 % Reduction Depth: (cm) 0.1 Epitheliali Area: (cm) 1.021 Tunneling: Volume: (cm) 0.102 Underminin in Area: 0% in Volume: 0% zation: None No g: No Wound Description Classification: Partial Thickness Foul Odor A Exudate Amount: Medium Slough/Fibr Exudate Type: Serosanguineous Exudate Color: red, brown fter Cleansing: No ino Yes Wound Bed Granulation Amount: None Present (0%) Exposed Structure Necrotic Amount: Large (67-100%) Fascia Exposed: No Necrotic Quality: Eschar Fat Layer (Subcutaneous Tissue) Exposed: Yes Tendon Exposed: No Muscle Exposed: No Joint Exposed: No  Bone Exposed: No Treatment Notes Wound #1 (Left, Anterior Lower Leg) Renk, Talea B. ([id number]) Notes Iodoflex, ABD, 3L(L),  unna to anchor Electronic Signature(s) Signed: 08/20/2019 2:28:23 PM By: Gretta Cool, BSN, RN, CWS, Kim RN, BSN Signed: 08/20/2019 3:51:55 PM By: Army Melia Previous Signature: 08/14/2019 4:11:15 PM Version By: Army Melia [name]: Gretta Cool BSN, RN, CWS, Kim on 08/20/2019 14:28:23 Doeden, [name] ([id number]) -------------------------------------------------------------------------------- Vitals Details Patient Name: Adolph, Deepa B. Date of Service: 08/14/2019 2:15 PM Medical Record Number: [id number] Patient Account Number: [id number] Date of Birth/Sex: [birth year] (43 y.o. F) Treating RN: Army Melia Primary Care Bron Snellings: Harrel Lemon Other Clinician: Referring Andalyn Heckstall: Harrel Lemon Treating Alanson Hausmann/Extender: Tito Dine in Treatment: 0 Vital Signs Time Taken: 14:21 Temperature (F): 98.2 Height (in): 60 Pulse (bpm): 59 Source: Stated Respiratory Rate (breaths/min): 16 Weight (lbs): 204 Blood Pressure (mmHg): 125/76 Source: Stated Reference Range: 80 - 120 mg / dl Body Mass Index (BMI): 39.8 Electronic Signature(s) Signed: 08/14/2019 4:11:15 PM By: Army Melia [name]: Army Melia on 08/14/2019 14:22:14

## 2019-08-15 NOTE — Progress Notes (Addendum)
TODD, ARGABRIGHT ([id number]) Visit Report for 08/14/2019 Chief Complaint Document Details Patient Name: [name], [name] Date of Service: 08/14/2019 2:15 PM Medical Record Number: [id number] Patient Account Number: [id number] Date of Birth/Sex: 1976/03/02 (43 y.o. F) Treating RN: Cornell Barman Primary Care Provider: Harrel Lemon Other Clinician: Referring Provider: Harrel Lemon Treating Provider/Extender: Tito Dine in Treatment: 0 Information Obtained from: Patient Chief Complaint 08/14/2019; the patient is here for review of a wound on her left anterior mid tibia that is been present for 1 year Electronic Signature(s) Signed: 08/14/2019 5:27:47 PM By: Linton Ham MD [name]: Linton Ham on 08/14/2019 15:44:46 [name], [name] ([id number]) -------------------------------------------------------------------------------- Debridement Details Patient Name: [name], [name] Date of Service: 08/14/2019 2:15 PM Medical Record Number: [id number] Patient Account Number: [id number] Date of Birth/Sex: [birth year] (43 y.o. F) Treating RN: Cornell Barman Primary Care Provider: Harrel Lemon Other Clinician: Referring Provider: Harrel Lemon Treating Provider/Extender: Tito Dine in Treatment: 0 Debridement Performed for Wound #1 Left,Anterior Lower Leg Assessment: Performed By: Physician Ricard Dillon, MD Debridement Type: Debridement Severity of Tissue Pre Fat layer exposed Debridement: Level of Consciousness (Pre- Awake and Alert procedure): Pre-procedure Verification/Time Yes - 14:58 Out Taken: Start Time: 14:58 Pain Control: Lidocaine Total Area Debrided (L x W): 1.3 (cm) x 1 (cm) = 1.3 (cm) Tissue and other material Viable, Non-Viable, Eschar, Subcutaneous debrided: Level: Skin/Subcutaneous Tissue Debridement Description: Excisional Instrument: Curette Bleeding: Minimum Hemostasis Achieved: Pressure End Time: 14:59 Response to  Treatment: Procedure was tolerated well Level of Consciousness Awake and Alert (Post-procedure): Post Debridement Measurements of Total Wound Length: (cm) 1.3 Width: (cm) 1 Depth: (cm) 0.4 Volume: (cm) 0.408 Character of Wound/Ulcer Post Debridement: Requires Further Debridement Severity of Tissue Post Debridement: Fat layer exposed Post Procedure Diagnosis Same as Pre-procedure Electronic Signature(s) Signed: 08/14/2019 5:27:47 PM By: Linton Ham MD Signed: 08/14/2019 5:53:54 PM By: Gretta Cool, BSN, RN, CWS, Kim RN, BSN [name]: Gretta Cool, BSN, RN, CWS, Kim on 08/14/2019 14:59:29 [name], [name] ([id number]) -------------------------------------------------------------------------------- HPI Details Patient Name: [name], [name] Date of Service: 08/14/2019 2:15 PM Medical Record Number: [id number] Patient Account Number: [id number] Date of Birth/Sex: 08-Feb-1976 (43 y.o. F) Treating RN: Cornell Barman Primary Care Provider: Harrel Lemon Other Clinician: Referring Provider: Harrel Lemon Treating Provider/Extender: Tito Dine in Treatment: 0 History of Present Illness HPI Description: ADMISSION 08/14/2019 This is a 43 year old woman who is here for review of a longstanding wound on the left anterior tibial area for perhaps as long as 1 year. 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 She recently had an IVC stent placed. She relates this wound VAC to a complicated admission in June 2019. She was admitted to hospital for a planned hysterectomy she developed acute tubular necrosis postop. . A CT angiogram showed an IVC filter occlusion. She was transferred to [facility]. I did not look out open over these records. Apparently her kidney function recovered. 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 She has not been doing anything to  this wound except for episodic Neosporin. She has not been wearing compression. 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 As I understand things she is had a recent IVC stent Past medical history multiple DVTs as noted above on chronic anticoagulation with Xarelto, postoperative acute tubular necrosis I think has completely resolved per  her description although I have not verified this. ABIs in our clinic were noncompressible on the left Electronic Signature(s) Signed: 08/14/2019 5:27:47 PM By: Baltazar Najjar MD [name]: Baltazar Najjar on 08/14/2019 15:49:35 [name], [name] ([id number]) -------------------------------------------------------------------------------- Physical Exam Details Patient Name: [name], [name] Date of Service: 08/14/2019 2:15 PM Medical Record Number: [id number] Patient Account Number: [id number] Date of Birth/Sex: 1976-04-24 (43 y.o. F) Treating RN: Huel Coventry Primary Care Provider: Marcelino Duster Other Clinician: Referring Provider: Marcelino Duster Treating Provider/Extender: Altamese [place] in Treatment: 0 Constitutional Sitting or standing Blood Pressure is within target range for patient.. Pulse regular and within target range for patient.[name] Respirations regular, non-labored and within target range.. Temperature is normal and within the target range for the patient.[name] appears in no distress. Respiratory Respiratory effort is easy and symmetric bilaterally. Rate is normal at rest and on room air.. Bilateral breath sounds are clear and equal in all lobes with no wheezes, rales or rhonchi.. Cardiovascular Heart rhythm and rate regular, without murmur or gallop.. Patient has mild nonpitting edema in both legs with clear signs of venous hypertension.. Integumentary (Hair, Skin) Surrounding discoloration of the wound but no tenderness.[name] Psychiatric No evidence of depression, anxiety, or agitation. Calm, cooperative, and communicative.  Appropriate interactions and affect.. Notes Wound exam; circular wound on the left mid tibia about the size of a dime. Necrotic black eschar over the entire wound circumference. Using a #5 curette this was removed debris on the wound surface removed and we are left with a small wound with roughly 2 to 3 mm in depth no evidence of surrounding infection. Electronic Signature(s) Signed: 08/14/2019 5:27:47 PM By: Baltazar Najjar MD [name]: Baltazar Najjar on 08/14/2019 15:51:07 Handlin, [name] ([id number]) -------------------------------------------------------------------------------- Physician Orders Details Patient Name: [name], [name] Date of Service: 08/14/2019 2:15 PM Medical Record Number: [id number] Patient Account Number: [id number] Date of Birth/Sex: 1976-07-25 (43 y.o. F) Treating RN: Huel Coventry Primary Care Provider: Marcelino Duster Other Clinician: Referring Provider: Marcelino Duster Treating Provider/Extender: Altamese [place] in Treatment: 0 Verbal / Phone Orders: No Diagnosis Coding Wound Cleansing Wound #1 Left,Anterior Lower Leg o Clean wound with Normal Saline. o May shower with protection. - Cast protector to keep wrap dry Anesthetic (add to Medication List) Wound #1 Left,Anterior Lower Leg o Topical Lidocaine 4% cream applied to wound bed prior to debridement (In Clinic Only). Primary Wound Dressing Wound #1 Left,Anterior Lower Leg o Iodoflex Secondary Dressing Wound #1 Left,Anterior Lower Leg o ABD pad Dressing Change Frequency Wound #1 Left,Anterior Lower Leg o Change dressing every week o Other: - Nurse Visit Friday Follow-up Appointments Wound #1 Left,Anterior Lower Leg o Return Appointment in 1 week. o Nurse Visit as needed Edema Control Wound #1 Left,Anterior Lower Leg o 3 Layer Compression System - Left Lower Extremity Electronic Signature(s) Signed: 08/14/2019 5:27:47 PM By: Baltazar Najjar MD Signed: 08/14/2019  5:53:54 PM By: Elliot Gurney, BSN, RN, CWS, Kim RN, BSN [name]: Elliot Gurney, BSN, RN, CWS, Kim on 08/14/2019 15:12:30 Simko, [name] ([id number]) -------------------------------------------------------------------------------- Problem List Details Patient Name: Sanderlin, Gladiola B. Date of Service: 08/14/2019 2:15 PM Medical Record Number: [id number] Patient Account Number: [id number] Date of Birth/Sex: 26-Aug-1976 (43 y.o. 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 Date of Service: 08/14/2019 2:15 PM Medical Record Number: [id number] Patient Account Number: [id number] Date of Birth/Sex: Jul 11, 1976 (43 y.o. F) Treating RN: Huel Coventry Primary Care Provider: Marcelino Duster Other Clinician: Referring Provider: Marcelino Duster Treating Provider/Extender: Altamese [place] in Treatment: 0 Subjective Chief Complaint Information obtained from Patient 08/14/2019; the patient is here for review of a wound on her left anterior mid tibia that is been present for 1 year History of Present Illness (HPI) ADMISSION 08/14/2019 This is a 43 year old woman who is here for review of a longstanding wound  on the left anterior tibial area for perhaps as long as 1 year. 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 She recently had an IVC stent placed. She relates this wound VAC to a complicated admission in June 2019. She was admitted to hospital for a planned hysterectomy she developed acute tubular necrosis postop. . A CT angiogram showed an IVC filter occlusion. She was transferred to [facility]. I did not look out open over these records. Apparently her kidney function recovered. 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 She has not been doing anything to this wound except for episodic Neosporin. She has not been wearing compression. 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 As I understand things she is had a recent IVC stent Past medical history multiple DVTs as noted above on chronic anticoagulation with Xarelto, postoperative acute tubular necrosis I think has completely resolved per her description although I have not verified this. ABIs in our clinic were noncompressible on the left Patient History Information obtained from Patient. Allergies No Known Allergies Family History Cancer - Maternal Grandparents, Diabetes - Mother, Heart Disease - Maternal Grandparents, Seizures - Maternal Grandparents, No family history of Hereditary Spherocytosis, Hypertension, Kidney Disease, Lung Disease, Stroke, Thyroid Problems, Tuberculosis. Social History Never smoker, Marital Status - Married, Alcohol Use - Moderate, Drug Use - No History, Caffeine Use - Daily. Medical History Ear/Nose/Mouth/Throat Denies history of Chronic sinus problems/congestion, Middle ear problems Hematologic/Lymphatic Denies history of Anemia, Hemophilia, Human Immunodeficiency Virus, Lymphedema, Sickle Cell Disease Respiratory Denies history of Aspiration, Asthma,  Chronic Obstructive Pulmonary Disease (COPD), Pneumothorax, Sleep Apnea, [name], [name] ([id number]) Tuberculosis Cardiovascular Denies history of Angina, Arrhythmia, Congestive Heart Failure, Coronary Artery Disease, Deep Vein Thrombosis, Hypertension, Hypotension, Myocardial Infarction, Peripheral Arterial Disease, Peripheral Venous Disease, Phlebitis, Vasculitis Gastrointestinal Denies history of Cirrhosis , Colitis, Crohn s, Hepatitis A, Hepatitis B, Hepatitis C Endocrine Denies history of Type I Diabetes, Type II Diabetes Genitourinary Denies history of End Stage Renal Disease Immunological Denies history of Lupus Erythematosus, Raynaud s, Scleroderma Integumentary (Skin) Denies history of History of Burn, History of pressure wounds Musculoskeletal Denies history of Gout, Rheumatoid Arthritis, Osteoarthritis, Osteomyelitis Neurologic Denies history of Dementia, Neuropathy, Quadriplegia, Paraplegia, Seizure Disorder Oncologic Denies history of Received Chemotherapy, Received Radiation Psychiatric Denies history of Anorexia/bulimia, Confinement Anxiety Review of Systems (ROS) Constitutional Symptoms (General Health) Denies complaints or symptoms of Fatigue, Fever, Chills, Marked Weight Change. Eyes Denies complaints or symptoms  of Dry Eyes, Vision Changes, Glasses / Contacts. Ear/Nose/Mouth/Throat Denies complaints or symptoms of Difficult clearing ears, Sinusitis. Hematologic/Lymphatic Denies complaints or symptoms of Bleeding / Clotting Disorders, Human Immunodeficiency Virus. Respiratory Denies complaints or symptoms of Chronic or frequent coughs, Shortness of Breath. Cardiovascular Denies complaints or symptoms of Chest pain, LE edema. Gastrointestinal Denies complaints or symptoms of Frequent diarrhea, Nausea, Vomiting. Endocrine Denies complaints or symptoms of Hepatitis, Thyroid disease, Polydypsia (Excessive Thirst). Genitourinary Denies complaints or symptoms  of Kidney failure/ Dialysis, Incontinence/dribbling. Immunological Denies complaints or symptoms of Hives, Itching. Integumentary (Skin) Complains or has symptoms of Wounds. Denies complaints or symptoms of Bleeding or bruising tendency, Breakdown, Swelling. Musculoskeletal Denies complaints or symptoms of Muscle Pain, Muscle Weakness. Neurologic Denies complaints or symptoms of Numbness/parasthesias, Focal/Weakness. Psychiatric Denies complaints or symptoms of Anxiety, Claustrophobia. Isbell, [name] Kitchen ([id number]) Objective Constitutional Sitting or standing Blood Pressure is within target range for patient.. Pulse regular and within target range for patient.[name] Respirations regular, non-labored and within target range.. Temperature is normal and within the target range for the patient.[name] appears in no distress. Vitals Time Taken: 2:21 PM, Height: 60 in, Source: Stated, Weight: 204 lbs, Source: Stated, BMI: 39.8, Temperature: 98.2 F, Pulse: 59 bpm, Respiratory Rate: 16 breaths/min, Blood Pressure: 125/76 mmHg. Respiratory Respiratory effort is easy and symmetric bilaterally. Rate is normal at rest and on room air.. Bilateral breath sounds are clear and equal in all lobes with no wheezes, rales or rhonchi.. Cardiovascular Heart rhythm and rate regular, without murmur or gallop.. Patient has mild nonpitting edema in both legs with clear signs of venous hypertension.[name] Psychiatric No evidence of depression, anxiety, or agitation. Calm, cooperative, and communicative. Appropriate interactions and affect.. General Notes: Wound exam; circular wound on the left mid tibia about the size of a dime. Necrotic black eschar over the entire wound circumference. Using a #5 curette this was removed debris on the wound surface removed and we are left with a small wound with roughly 2 to 3 mm in depth no evidence of surrounding infection. Integumentary (Hair, Skin) Surrounding discoloration of the wound  but no tenderness.. Wound #1 status is Open. Original cause of wound was Blister. The wound is located on the Left,Anterior Lower Leg. The wound measures 1.3cm length x 1cm width x 0.1cm depth; 1.021cm^2 area and 0.102cm^3 volume. There is Fat Layer (Subcutaneous Tissue) Exposed exposed. There is no tunneling or undermining noted. There is a medium amount of serosanguineous drainage noted. There is no granulation within the wound bed. There is a large (67-100%) amount of necrotic tissue within the wound bed including Eschar. 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 ([id number]) Wound #1 Pre-procedure diagnosis of Wound #1 is a Venous Leg Ulcer located on the Left,Anterior Lower Leg .Severity of Tissue Pre Debridement is: Fat layer exposed. There was a Excisional Skin/Subcutaneous Tissue Debridement with a total area of 1.3 sq cm performed by Maxwell Caul, MD. With the following instrument(s): Curette to remove Viable and Non-Viable tissue/material. Material removed includes Eschar and Subcutaneous Tissue and after achieving pain control using Lidocaine. No specimens were taken. A time out was conducted at 14:58, prior to the start of the procedure. A Minimum amount of bleeding was controlled with Pressure. The procedure was tolerated well. Post Debridement Measurements: 1.3cm length x 1cm width x 0.4cm depth; 0.408cm^3 volume. Character  of Wound/Ulcer Post Debridement requires further debridement. Severity of Tissue Post Debridement is: Fat layer exposed. Post procedure Diagnosis Wound #1: Same as Pre-Procedure Plan Wound Cleansing: Wound #1 Left,Anterior Lower Leg: Clean wound with Normal Saline. May shower with protection. - Cast protector to keep wrap dry Anesthetic (add to  Medication List): Wound #1 Left,Anterior Lower Leg: Topical Lidocaine 4% cream applied to wound bed prior to debridement (In Clinic Only). Primary Wound Dressing: Wound #1 Left,Anterior Lower Leg: Iodoflex Secondary Dressing: Wound #1 Left,Anterior Lower Leg: ABD pad Dressing Change Frequency: Wound #1 Left,Anterior Lower Leg: Change dressing every week Other: - Nurse Visit Friday Follow-up Appointments: Wound #1 Left,Anterior Lower Leg: Return Appointment in 1 week. Nurse Visit as needed Edema Control: Wound #1 Left,Anterior Lower Leg: 3 Layer Compression System - Left Lower Extremity 1. Patient with a chronic wound on her left anterior tibia probably related mostly to chronic venous hypertension complicated by acute kidney failure and marked edema about a year ago. It would appear from looking at the wound that there is actually been contraction here although the surface was in no way conducive to healing. 2. The wound was debrided the surface looks healthy. We will use Iodoflex as the primary dressing to see if we can maintain a healthy wound surface before likely moving to endoform. 3. The patient had a stent placed in her IVC although I think she still has clear venous hypertension. We put her in 3 layer compression Electronic Signature(s) Rinke, SHERLY BRODBECK ([id number]) Signed: 08/20/2019 2:29:05 PM By: Elliot Gurney, BSN, RN, CWS, Kim RN, BSN Signed: 08/21/2019 9:15:25 AM By: Baltazar Najjar MD Previous Signature: 08/14/2019 5:27:47 PM Version By: Baltazar Najjar MD [name]: Elliot Gurney, BSN, RN, CWS, Kim on 08/20/2019 14:29:05 Pavlik, [name] ([id number]) -------------------------------------------------------------------------------- ROS/PFSH Details Patient Name: [name], [name] Date of Service: 08/14/2019 2:15 PM Medical Record Number: [id number] Patient Account Number: [id number] Date of Birth/Sex: 1976/05/08 (43 y.o. F) Treating RN: Rodell Perna Primary Care Provider: Marcelino Duster Other Clinician: Referring Provider: Marcelino Duster Treating Provider/Extender: Altamese [place] in Treatment: 0 Information Obtained From Patient Constitutional Symptoms (General Health) Complaints and Symptoms: Negative for: Fatigue; Fever; Chills; Marked Weight Change Eyes Complaints and Symptoms: Negative for: Dry Eyes; Vision Changes; Glasses / Contacts Ear/Nose/Mouth/Throat Complaints and Symptoms: Negative for: Difficult clearing ears; Sinusitis Medical History: Negative for: Chronic sinus problems/congestion; Middle ear problems Hematologic/Lymphatic Complaints and Symptoms: Negative for: Bleeding / Clotting Disorders; Human Immunodeficiency Virus Medical History: Negative for: Anemia; Hemophilia; Human Immunodeficiency Virus; Lymphedema; Sickle Cell Disease Respiratory Complaints and Symptoms: Negative for: Chronic or frequent coughs; Shortness of Breath Medical History: Negative for: Aspiration; Asthma; Chronic Obstructive Pulmonary Disease (COPD); Pneumothorax; Sleep Apnea; Tuberculosis Cardiovascular Complaints and Symptoms: Negative for: Chest pain; LE edema Medical History: Negative for: Angina; Arrhythmia; Congestive Heart Failure; Coronary Artery Disease; Deep Vein Thrombosis; Hypertension; Hypotension; Myocardial Infarction; Peripheral Arterial Disease; Peripheral Venous Disease; Phlebitis; Vasculitis Gastrointestinal [name], [name] ([id number]) Complaints and Symptoms: Negative for: Frequent diarrhea; Nausea; Vomiting Medical History: Negative for: Cirrhosis ; Colitis; Crohnos; Hepatitis A; Hepatitis B; Hepatitis C Endocrine Complaints and Symptoms: Negative for: Hepatitis; Thyroid disease; Polydypsia (Excessive Thirst) Medical History: Negative for: Type I Diabetes; Type II Diabetes Genitourinary Complaints and Symptoms: Negative for: Kidney failure/ Dialysis; Incontinence/dribbling Medical History: Negative for: End Stage Renal  Disease Immunological Complaints and Symptoms: Negative for: Hives; Itching Medical History: Negative for: Lupus Erythematosus; Raynaudos; Scleroderma Integumentary (Skin) Complaints and Symptoms: Positive for: Wounds Negative for: Bleeding or bruising tendency; Breakdown; Swelling  Medical History: Negative for: History of Burn; History of pressure wounds Musculoskeletal Complaints and Symptoms: Negative for: Muscle Pain; Muscle Weakness Medical History: Negative for: Gout; Rheumatoid Arthritis; Osteoarthritis; Osteomyelitis Neurologic Complaints and Symptoms: Negative for: Numbness/parasthesias; Focal/Weakness Medical History: Negative for: Dementia; Neuropathy; Quadriplegia; Paraplegia; Seizure Disorder Psychiatric Complaints and Symptoms: Negative for: Anxiety; Claustrophobia [name], [name] ([id number]) Medical History: Negative for: Anorexia/bulimia; Confinement Anxiety Oncologic Medical History: Negative for: Received Chemotherapy; Received Radiation Immunizations Pneumococcal Vaccine: Received Pneumococcal Vaccination: No Implantable Devices None Family and Social History Cancer: Yes - Maternal Grandparents; Diabetes: Yes - Mother; Heart Disease: Yes - Maternal Grandparents; Hereditary Spherocytosis: No; Hypertension: No; Kidney Disease: No; Lung Disease: No; Seizures: Yes - Maternal Grandparents; Stroke: No; Thyroid Problems: No; Tuberculosis: No; Never smoker; Marital Status - Married; Alcohol Use: Moderate; Drug Use: No History; Caffeine Use: Daily; Financial Concerns: No; Food, Clothing or Shelter Needs: No; Support System Lacking: No; Transportation Concerns: No Electronic Signature(s) Signed: 08/14/2019 4:11:15 PM By: Rodell PernaScott, Dajea Signed: 08/14/2019 5:27:47 PM By: Baltazar Najjarobson, Michael MD [name]: Rodell PernaScott, Dajea on 08/14/2019 14:31:13 Nairn, [name] ([id number]) -------------------------------------------------------------------------------- SuperBill  Details Patient Name: Kissick, Skyrah B. Date of Service: 08/14/2019 Medical Record Number: [id number] Patient Account Number: [id number] Date of Birth/Sex: 06/18/1976 (43 y.o. 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 Facility Procedures CPT4 Code Description: [id number] - WOUND CARE VISIT-LEV 3 EST PT Modifier: Quantity: 1 CPT4 Code Description: [id number] 11042 - DEB SUBQ TISSUE 20 SQ CM/< ICD-10 Diagnosis Description 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 I87.312 Chronic venous hypertension (idiopathic) with ulcer of left lowe Modifier: ed to breakdown r extremity Quantity: 1 of skin Physician Procedures CPT4 Code Description: [id number] WC PHYS LEVEL 3 o NEW PT ICD-10 Diagnosis Description 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 I87.312 Chronic venous hypertension (idiopathic) with ulcer of left lowe 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

## 2019-08-21 ENCOUNTER — Other Ambulatory Visit: Payer: Self-pay

## 2019-08-21 ENCOUNTER — Encounter: Payer: BC Managed Care – PPO | Admitting: Internal Medicine

## 2019-08-21 DIAGNOSIS — I87312 Chronic venous hypertension (idiopathic) with ulcer of left lower extremity: Secondary | ICD-10-CM | POA: Diagnosis not present

## 2019-08-21 NOTE — Progress Notes (Signed)
[name], [name] ([id number]) Visit Report for 08/21/2019 HPI Details Patient Name: [name], [name] Date of Service: 08/21/2019 8:45 AM Medical Record Number: [id number] Patient Account Number: [id number] Date of Birth/Sex: 02/24/1976 (43 y.o. F) Treating RN: Huel Coventry Primary Care Provider: Marcelino Duster Other Clinician: Referring Provider: Marcelino Duster Treating Provider/Extender: Altamese [place] in Treatment: 1 History of Present Illness HPI Description: ADMISSION 08/14/2019 This is a 43 year old woman who is here for review of a longstanding wound on the left anterior tibial area for perhaps as long as 1 year. 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 She recently had an IVC stent placed. She relates this wound to a complicated admission in June 2019. She was admitted to hospital for a planned hysterectomy she developed acute tubular necrosis postop. . A CT angiogram showed an IVC filter occlusion. She was transferred to [facility]. I did not look out open over these records. Apparently her kidney function recovered. 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 She has not been doing anything to this wound except for episodic Neosporin. She has not been wearing compression. 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 As I understand things she is had a recent IVC stent Past medical history multiple DVTs as noted above on chronic anticoagulation with Xarelto, postoperative acute tubular necrosis I think has completely resolved per her description although I have not verified this. ABIs in our clinic were noncompressible on the left 9/30; wound surface looks better. No mechanical debridement. We have been using Iodoflex under 3 layer compression. Electronic Signature(s) Signed: 08/21/2019 4:51:07 PM By: Baltazar Najjar MD Entered  By: Baltazar Najjar on 08/21/2019 09:10:34 [name], [name] ([id number]) -------------------------------------------------------------------------------- Physical Exam Details Patient Name: [name], [name] Date of Service: 08/21/2019 8:45 AM Medical Record Number: [id number] Patient Account Number: [id number] Date of Birth/Sex: Sep 28, 1976 (43 y.o. F) Treating RN: Huel Coventry Primary Care Provider: Marcelino Duster Other Clinician: Referring Provider: Marcelino Duster Treating Provider/Extender: Altamese [place] in Treatment: 1 Constitutional Sitting or standing Blood Pressure is within target range for patient.. Pulse regular and within target range for patient.[name] Respirations regular, non-labored and within target range.. Temperature is normal and within the target range for the patient.[name] appears in no distress. Eyes Conjunctivae clear. No discharge. Respiratory Respiratory effort is easy and symmetric bilaterally. Rate is normal at rest and on room air.. Cardiovascular Pedal pulses are reduced but palpable. Signs of chronic venous hypertension however edema is well controlled. Integumentary (Hair, Skin) No erythema around the wound and no other skin issues are seen. Psychiatric No evidence of depression, anxiety, or agitation. Calm, cooperative, and communicative. Appropriate interactions and affect.. Notes Wound exam; circular wound on the left mid tibia. I remove the necrotic black eschar she had over the wound surface last time. This looks a lot better debrided with saline and gauze. Wound surface looks reasonable. There seems to be a lot less depth than last week. No evidence of surrounding infection Electronic Signature(s) Signed: 08/21/2019 4:51:07 PM By: Baltazar Najjar MD [name]: Baltazar Najjar on 08/21/2019 09:12:47 [name], [name] ([id number]) -------------------------------------------------------------------------------- Physician Orders Details Patient Name:  [name], [name] Date of Service: 08/21/2019 8:45 AM Medical Record Number: [id number] Patient Account Number: [id number] Date of Birth/Sex: March 05, 1976 (43 y.o. F) Treating RN: Huel Coventry Primary Care Provider: Marcelino Duster Other  Clinician: Referring Provider: Harrel Lemon Treating Provider/Extender: Tito Dine in Treatment: 1 Verbal / Phone Orders: No Diagnosis Coding Wound Cleansing Wound #1 Left,Anterior Lower Leg o Clean wound with Normal Saline. o May shower with protection. - Cast protector to keep wrap dry Anesthetic (add to Medication List) Wound #1 Left,Anterior Lower Leg o Topical Lidocaine 4% cream applied to wound bed prior to debridement (In Clinic Only). Primary Wound Dressing Wound #1 Left,Anterior Lower Leg o Iodoflex Secondary Dressing Wound #1 Left,Anterior Lower Leg o ABD pad Dressing Change Frequency Wound #1 Left,Anterior Lower Leg o Change dressing every week o Other: - Nurse Visit Friday Follow-up Appointments Wound #1 Left,Anterior Lower Leg o Return Appointment in 1 week. o Nurse Visit as needed Edema Control Wound #1 Left,Anterior Lower Leg o 3 Layer Compression System - Left Lower Extremity - Unna to anchor Electronic Signature(s) Signed: 08/21/2019 4:51:07 PM By: Linton Ham MD Signed: 08/21/2019 5:01:16 PM By: Gretta Cool, BSN, RN, CWS, Kim RN, BSN [name]: Gretta Cool, BSN, RN, CWS, Kim on 08/21/2019 08:58:41 [name], [name] ([id number]) -------------------------------------------------------------------------------- Problem List Details Patient Name: [name], [name] Date of Service: 08/21/2019 8:45 AM Medical Record Number: [id number] Patient Account Number: [id number] Date of Birth/Sex: Dec 28, 1975 (43 y.o. 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 Date of Service: 08/21/2019 8:45 AM Medical Record Number: [id number] Patient Account Number: [id number] Date of Birth/Sex: 1976-05-27 (43 y.o. F) Treating RN: Cornell Barman Primary Care Provider: Harrel Lemon Other Clinician: Referring Provider: Harrel Lemon Treating Provider/Extender: Tito Dine in Treatment: 1 Subjective History of Present Illness (HPI) ADMISSION 08/14/2019 This is a 43 year old woman who is here for review of a longstanding wound on the left anterior tibial area for perhaps as long as 1 year. 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 She recently had an IVC stent placed. She relates this wound to a complicated admission in June 2019. She was admitted to hospital for a planned hysterectomy she developed acute tubular necrosis postop. . A CT angiogram showed an IVC filter occlusion. She was transferred to [facility]. I did not look out open over these records. Apparently her kidney function recovered. 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 She has not been doing anything to this wound except for episodic Neosporin. She has not been wearing  compression. 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 As I understand things she is had a recent IVC stent Past medical history multiple DVTs as noted above on chronic anticoagulation with Xarelto, postoperative acute tubular necrosis I think has completely resolved per her description although I have not verified this. ABIs in our clinic were noncompressible on the left 9/30; wound surface looks better. No mechanical debridement. We have been using  Iodoflex under 3 layer compression. Objective Constitutional Sitting or standing Blood Pressure is within target range for patient.. Pulse regular and within target range for patient.[name] Respirations regular, non-labored and within target range.. Temperature is normal and within the target range for the patient.[name] appears in no distress. Vitals Time Taken: 8:42 AM, Height: 60 in, Weight: 204 lbs, BMI: 39.8, Temperature: 98.2 F, Pulse: 63 bpm, Respiratory Rate: 16 breaths/min, Blood Pressure: 119/79 mmHg. Eyes Conjunctivae clear. No discharge. Respiratory Respiratory effort is easy and symmetric bilaterally. Rate is normal at rest and on room air.. Cardiovascular Pedal pulses are reduced but palpable. Signs of chronic venous hypertension however edema is well controlled. [name], [name] ([id number]) Psychiatric No evidence of depression, anxiety, or agitation. Calm, cooperative, and communicative. Appropriate interactions and affect.. General Notes: Wound exam; circular wound on the left mid tibia. I remove the necrotic black eschar she had over the wound surface last time. This looks a lot better debrided with saline and gauze. Wound surface looks reasonable. There seems to be a lot less depth than last week. No evidence of surrounding infection Integumentary (Hair, Skin) No erythema around the wound and no other skin issues are seen. Wound #1 status is Open. Original cause of wound was Blister. The wound is located on the  Left,Anterior Lower Leg. The wound measures 1.3cm length x 1.2cm width x 0.1cm depth; 1.225cm^2 area and 0.123cm^3 volume. There is Fat Layer (Subcutaneous Tissue) Exposed exposed. There is no tunneling or undermining noted. There is a medium amount of serosanguineous drainage noted. There is medium (34-66%) granulation within the wound bed. There is a medium (34-66%) amount of necrotic tissue within the wound bed including Eschar. 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 There was a Three Layer Compression Therapy Procedure by Huel Coventry, RN. Post procedure Diagnosis Wound #1: Same as Pre-Procedure Plan Wound Cleansing: Wound #1 Left,Anterior Lower Leg: Clean wound with Normal Saline. May shower with protection. - Cast protector to keep wrap dry Anesthetic (add to Medication List): Wound #1 Left,Anterior Lower Leg: Topical Lidocaine 4% cream applied to wound bed prior to debridement (In Clinic Only). Primary Wound Dressing: Wound #1 Left,Anterior Lower Leg: Marschner, Bindi B. ([id number]) Iodoflex Secondary Dressing: Wound #1 Left,Anterior Lower Leg: ABD pad Dressing Change Frequency: Wound #1 Left,Anterior Lower Leg: Change dressing every week Other: - Nurse Visit Friday Follow-up Appointments: Wound #1 Left,Anterior Lower Leg: Return Appointment in 1 week. Nurse Visit as needed Edema Control: Wound #1 Left,Anterior Lower Leg: 3 Layer Compression System - Left Lower Extremity - Unna to anchor 1. The wound looks a lot better than last week. 2. Surface looks better and appears that there is improvement in wound volume especially depth 3. I am continuing with Iodoflex this week,  consider change to [name] next week. 4. 3 layer compression which she appears to be tolerating. 5. I would like to look over her records from her complicated hospitalization at [facility] specifically whether there were arterial studies done on the left. As far as I know she had an IVC stent placed but there may be more than 1 stent in place. I think this is all venous but I would like to be sure. She is on chronic Xarelto for history of  multiple DVTs Electronic Signature(s) Signed: 08/21/2019 4:51:07 PM By: Baltazar Najjarobson, Jashaun Penrose MD [name]: Baltazar Najjarobson, Leonora Gores on 08/21/2019 09:14:19 [name], [name] ([id number]) -------------------------------------------------------------------------------- SuperBill Details Patient Name: [name], [name] Date of Service: 08/21/2019 Medical Record Number: [id number] Patient Account Number: [id number] Date of Birth/Sex: 05/30/1976 (43 y.o. 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 Facility Procedures CPT4 Code: [id number] Description: (Facility Use Only) [phone] - APPLY MULTLAY COMPRS LWR LT LEG Modifier: Quantity: 1 Physician Procedures CPT4 Code Description: [id number] 99213 - WC PHYS LEVEL 3 - EST PT ICD-10 Diagnosis Description 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

## 2019-08-21 NOTE — Progress Notes (Signed)
[name], Arly B. ([id number]) Visit Report for 08/21/2019 Arrival Information Details Patient Name: Helsley, Dinah BeersGINGER B. Date of Service: 08/21/2019 8:45 AM Medical Record Number: [id number] Patient Account Number: [id number] Date of Birth/Sex: 06/21/1976 (43 y.o. F) Treating RN: Rodell PernaScott, Dajea Primary Care Mirielle Byrum: Marcelino DusterJOHNSTON, JOHN Other Clinician: Referring Chloey Ricard: Marcelino DusterJOHNSTON, JOHN Treating Raymond Azure/Extender: Altamese CarolinaOBSON, MICHAEL G Weeks in Treatment: 1 Visit Information History Since Last Visit Added or deleted any medications: No Patient Arrived: Ambulatory Any new allergies or adverse reactions: No Arrival Time: 08:41 Had a fall or experienced change in No Accompanied By: son activities of daily living that may affect Transfer Assistance: None risk of falls: Patient Identification Verified: Yes Signs or symptoms of abuse/neglect since last visito No Patient Has Alerts: Yes Hospitalized since last visit: No Patient Alerts: Patient on Blood Thinner Has Dressing in Place as Prescribed: Yes Xarelto Pain Present Now: Yes Electronic Signature(s) Signed: 08/21/2019 10:54:02 AM By: Rodell PernaScott, Dajea [name]: Rodell PernaScott, Dajea on 08/21/2019 08:41:59 Mealey, Basha B. ([id number]) -------------------------------------------------------------------------------- Compression Therapy Details Patient Name: Boxwell, Ineta B. Date of Service: 08/21/2019 8:45 AM Medical Record Number: [id number] Patient Account Number: [id number] Date of Birth/Sex: 04/03/1976 (43 y.o. F) Treating RN: Huel CoventryWoody, Kim Primary Care Caedence Snowden: Marcelino DusterJOHNSTON, JOHN Other Clinician: Referring Sarit Sparano: Marcelino DusterJOHNSTON, JOHN Treating Hanya Guerin/Extender: Altamese CarolinaOBSON, MICHAEL G Weeks in Treatment: 1 Compression Therapy Performed for Wound Assessment: Wound #1 Left,Anterior Lower Leg Performed By: Clinician Huel CoventryWoody, Kim, RN Compression Type: Three Layer Post Procedure Diagnosis Same as Pre-procedure Electronic Signature(s) Signed: 08/21/2019 5:01:16 PM By:  Elliot GurneyWoody, BSN, RN, CWS, Kim RN, BSN [name]: Elliot GurneyWoody, BSN, RN, CWS, Kim on 08/21/2019 08:57:11 Trevor, [name] ([id number]) -------------------------------------------------------------------------------- Encounter Discharge Information Details Patient Name: Saintvil, Laterica B. Date of Service: 08/21/2019 8:45 AM Medical Record Number: [id number] Patient Account Number: [id number] Date of Birth/Sex: 07/19/1976 (43 y.o. F) Treating RN: Huel CoventryWoody, Kim Primary Care Kleigh Hoelzer: Marcelino DusterJOHNSTON, JOHN Other Clinician: Referring Syanne Looney: Marcelino DusterJOHNSTON, JOHN Treating Talajah Slimp/Extender: Altamese CarolinaOBSON, MICHAEL G Weeks in Treatment: 1 Encounter Discharge Information Items Discharge Condition: Stable Ambulatory Status: Ambulatory Discharge Destination: Home Transportation: Private Auto Accompanied By: son Schedule Follow-up Appointment: Yes Clinical Summary of Care: Electronic Signature(s) Signed: 08/21/2019 5:01:16 PM By: Elliot GurneyWoody, BSN, RN, CWS, Kim RN, BSN [name]: Elliot GurneyWoody, BSN, RN, CWS, Kim on 08/21/2019 08:58:04 Holstine, [name] ([id number]) -------------------------------------------------------------------------------- Lower Extremity Assessment Details Patient Name: Shelvin, Brinnley B. Date of Service: 08/21/2019 8:45 AM Medical Record Number: [id number] Patient Account Number: [id number] Date of Birth/Sex: 08/23/1976 (43 y.o. F) Treating RN: Rodell PernaScott, Dajea Primary Care Diontae Route: Marcelino DusterJOHNSTON, JOHN Other Clinician: Referring Noretta Frier: Marcelino DusterJOHNSTON, JOHN Treating Lakeshia Dohner/Extender: Altamese CarolinaOBSON, MICHAEL G Weeks in Treatment: 1 Edema Assessment Assessed: [Left: No] [Right: No] Edema: [Left: N] [Right: o] Calf Left: Right: Point of Measurement: 32 cm From Medial Instep 40 cm cm Ankle Left: Right: Point of Measurement: 8 cm From Medial Instep 22 cm cm Vascular Assessment Pulses: Dorsalis Pedis Palpable: [Left:Yes] Electronic Signature(s) Signed: 08/21/2019 10:54:02 AM By: Rodell PernaScott, Dajea [name]: Rodell PernaScott, Dajea on 08/21/2019  08:50:14 Steuber, Lorre B. ([id number]) -------------------------------------------------------------------------------- Multi Wound Chart Details Patient Name: Eynon, Maelys B. Date of Service: 08/21/2019 8:45 AM Medical Record Number: [id number] Patient Account Number: [id number] Date of Birth/Sex: 03/12/1976 (43 y.o. F) Treating RN: Huel CoventryWoody, Kim Primary Care Kamaiyah Uselton: Marcelino DusterJOHNSTON, JOHN Other Clinician: Referring [place] Kinyon: Marcelino DusterJOHNSTON, JOHN Treating Ladd Cen/Extender: Altamese CarolinaOBSON, MICHAEL G Weeks in Treatment: 1 Vital Signs Height(in): 60 Pulse(bpm): 63 Weight(lbs): 204 Blood Pressure(mmHg): 119/79 Body Mass Index(BMI): 40 Temperature(F): 98.2 Respiratory Rate 16 (breaths/min): Photos: [N/A:N/A] Wound Location: Left Lower Leg - Anterior N/A N/A  Wounding Event: Blister N/A N/A Primary Etiology: Venous Leg Ulcer N/A N/A Date Acquired: 05/21/2018 N/A N/A Weeks of Treatment: 1 N/A N/A Wound Status: Open N/A N/A Measurements L x W x D 1.3x1.2x0.1 N/A N/A (cm) Area (cm) : 1.225 N/A N/A Volume (cm) : 0.123 N/A N/A % Reduction in Area: -20.00% N/A N/A % Reduction in Volume: -20.60% N/A N/A Classification: Partial Thickness N/A N/A Exudate Amount: Medium N/A N/A Exudate Type: Serosanguineous N/A N/A Exudate Color: red, brown N/A N/A Granulation Amount: Medium (34-66%) N/A N/A Necrotic Amount: Medium (34-66%) N/A N/A Necrotic Tissue: Eschar N/A N/A Exposed Structures: Fat Layer (Subcutaneous N/A N/A Tissue) Exposed: Yes Fascia: No Tendon: No Muscle: No Joint: No Bone: No Epithelialization: None N/A N/A Procedures Performed: Compression Therapy N/A N/A Pevehouse, Miracle B. ([id number]) Treatment Notes Wound #1 (Left, Anterior Lower Leg) Notes Iodoflex, ABD, 3L(L), unna to anchor Electronic Signature(s) Signed: 08/21/2019 4:51:07 PM By: Baltazar Najjar MD [name]: Baltazar Najjar on 08/21/2019 09:08:20 Wingate, [name]  ([id number]) -------------------------------------------------------------------------------- Multi-Disciplinary Care Plan Details Patient Name: Leabo, Zelene B. Date of Service: 08/21/2019 8:45 AM Medical Record Number: [id number] Patient Account Number: [id number] Date of Birth/Sex: [birth year] (43 y.o. F) Treating RN: Huel Coventry Primary Care Albin Duckett: Marcelino Duster Other Clinician: Referring Kevante Lunt: Marcelino Duster Treating Derel Mcglasson/Extender: Altamese [place] in Treatment: 1 Active Inactive Necrotic Tissue Nursing Diagnoses: Impaired tissue integrity related to necrotic/devitalized tissue Goals: Necrotic/devitalized tissue will be minimized in the wound bed Date Initiated: 08/14/2019 Target Resolution Date: 08/21/2019 Goal Status: Active Interventions: Assess patient pain level pre-, during and post procedure and prior to discharge Treatment Activities: Apply topical anesthetic as ordered : 08/14/2019 Notes: Orientation to the Wound Care Program Nursing Diagnoses: Knowledge deficit related to the wound healing center program Goals: Patient/caregiver will verbalize understanding of the Wound Healing Center Program Date Initiated: 08/14/2019 Target Resolution Date: 08/21/2019 Goal Status: Active Interventions: Provide education on orientation to the wound center Notes: Wound/Skin Impairment Nursing Diagnoses: Impaired tissue integrity Goals: Ulcer/skin breakdown will have a volume reduction of 30% by week 4 Date Initiated: 08/14/2019 Target Resolution Date: 09/11/2019 Goal Status: Active Loppnow, CHERILYN SAUTTER ([id number]) Interventions: Assess ulceration(s) every visit Treatment Activities: Referred to DME Nicky Kras for dressing supplies : 08/14/2019 Notes: Electronic Signature(s) Signed: 08/21/2019 5:01:16 PM By: Elliot Gurney, BSN, RN, CWS, Kim RN, BSN [name]: Elliot Gurney, BSN, RN, CWS, Kim on 08/21/2019 08:54:49 Scherman, [name]  ([id number]) -------------------------------------------------------------------------------- Pain Assessment Details Patient Name: Washinton, Malala B. Date of Service: 08/21/2019 8:45 AM Medical Record Number: [id number] Patient Account Number: [id number] Date of Birth/Sex: Oct 01, 1976 (43 y.o. F) Treating RN: Rodell Perna Primary Care Graham Doukas: Marcelino Duster Other Clinician: Referring Traeton Bordas: Marcelino Duster Treating Illana Nolting/Extender: Altamese [place] in Treatment: 1 Active Problems Location of Pain Severity and Description of Pain Patient Has Paino Yes Site Locations Pain Location: Pain in Ulcers Rate the pain. Current Pain Level: 2 Pain Management and Medication Current Pain Management: Electronic Signature(s) Signed: 08/21/2019 10:54:02 AM By: Rodell Perna [name]: Rodell Perna on 08/21/2019 08:42:13 Hengst, [name] ([id number]) -------------------------------------------------------------------------------- Patient/Caregiver Education Details Patient Name: Duplantis, Janasia B. Date of Service: 08/21/2019 8:45 AM Medical Record Number: [id number] Patient Account Number: [id number] Date of Birth/Gender: 1976-06-22 (43 y.o. F) Treating RN: Huel Coventry Primary Care Physician: Marcelino Duster Other Clinician: Referring Physician: Marcelino Duster Treating Physician/Extender: Altamese [place] in Treatment: 1 Education Assessment Education Provided To: Patient Education Topics Provided Venous: Handouts: Controlling Swelling with Multilayered Compression Wraps Methods: Demonstration, Explain/Verbal Responses: State content correctly  Wound/Skin Impairment: Handouts: Caring for Your Ulcer Methods: Demonstration, Explain/Verbal Responses: State content correctly Electronic Signature(s) Signed: 08/21/2019 5:01:16 PM By: Gretta Cool, BSN, RN, CWS, Kim RN, BSN [name]: Gretta Cool, BSN, RN, CWS, Kim on 08/21/2019 08:57:33 Hunt, [name]  ([id number]) -------------------------------------------------------------------------------- Wound Assessment Details Patient Name: Pecore, Gabbie B. Date of Service: 08/21/2019 8:45 AM Medical Record Number: [id number] Patient Account Number: [id number] Date of Birth/Sex: 1976-01-22 (43 y.o. F) Treating RN: Army Melia Primary Care Shaka Zech: Harrel Lemon Other Clinician: Referring Teigen Bellin: Harrel Lemon Treating Jakie Debow/Extender: Tito Dine in Treatment: 1 Wound Status Wound Number: 1 Primary Etiology: Venous Leg Ulcer Wound Location: Left Lower Leg - Anterior Wound Status: Open Wounding Event: Blister Date Acquired: 05/21/2018 Weeks Of Treatment: 1 Clustered Wound: No Photos Wound Measurements Length: (cm) 1.3 Width: (cm) 1.2 Depth: (cm) 0.1 Area: (cm) 1.225 Volume: (cm) 0.123 % Reduction in Area: -20% % Reduction in Volume: -20.6% Epithelialization: None Tunneling: No Undermining: No Wound Description Classification: Partial Thickness Foul Odor Af Exudate Amount: Medium Slough/Fibri Exudate Type: Serosanguineous Exudate Color: red, brown ter Cleansing: No no Yes Wound Bed Granulation Amount: Medium (34-66%) Exposed Structure Necrotic Amount: Medium (34-66%) Fascia Exposed: No Necrotic Quality: Eschar Fat Layer (Subcutaneous Tissue) Exposed: Yes Tendon Exposed: No Muscle Exposed: No Joint Exposed: No Bone Exposed: No Treatment Notes Wound #1 (Left, Anterior Lower Leg) Flye, Asharia B. ([id number]) Notes Iodoflex, ABD, 3L(L), unna to anchor Electronic Signature(s) Signed: 08/21/2019 10:54:02 AM By: Army Melia [name]: Army Melia on 08/21/2019 08:49:42 Paver, Nohelia B. ([id number]) -------------------------------------------------------------------------------- Vitals Details Patient Name: Strollo, Aliz B. Date of Service: 08/21/2019 8:45 AM Medical Record Number: [id number] Patient Account Number: [id number] Date of Birth/Sex:  1975/12/28 (43 y.o. F) Treating RN: Army Melia Primary Care Zakhia Seres: Harrel Lemon Other Clinician: Referring Keshanna Riso: Harrel Lemon Treating Lanyla Costello/Extender: Tito Dine in Treatment: 1 Vital Signs Time Taken: 08:42 Temperature (F): 98.2 Height (in): 60 Pulse (bpm): 63 Weight (lbs): 204 Respiratory Rate (breaths/min): 16 Body Mass Index (BMI): 39.8 Blood Pressure (mmHg): 119/79 Reference Range: 80 - 120 mg / dl Electronic Signature(s) Signed: 08/21/2019 10:54:02 AM By: Army Melia [name]: Army Melia on 08/21/2019 08:42:43

## 2019-08-28 ENCOUNTER — Encounter: Payer: BC Managed Care – PPO | Attending: Internal Medicine | Admitting: Internal Medicine

## 2019-08-28 ENCOUNTER — Other Ambulatory Visit: Payer: Self-pay

## 2019-08-28 DIAGNOSIS — I87312 Chronic venous hypertension (idiopathic) with ulcer of left lower extremity: Secondary | ICD-10-CM | POA: Insufficient documentation

## 2019-08-28 DIAGNOSIS — Z90710 Acquired absence of both cervix and uterus: Secondary | ICD-10-CM | POA: Diagnosis not present

## 2019-08-28 DIAGNOSIS — I890 Lymphedema, not elsewhere classified: Secondary | ICD-10-CM | POA: Insufficient documentation

## 2019-08-28 DIAGNOSIS — L97821 Non-pressure chronic ulcer of other part of left lower leg limited to breakdown of skin: Secondary | ICD-10-CM | POA: Diagnosis present

## 2019-08-28 DIAGNOSIS — Z86718 Personal history of other venous thrombosis and embolism: Secondary | ICD-10-CM | POA: Insufficient documentation

## 2019-08-28 DIAGNOSIS — I872 Venous insufficiency (chronic) (peripheral): Secondary | ICD-10-CM | POA: Diagnosis not present

## 2019-08-28 DIAGNOSIS — Z7901 Long term (current) use of anticoagulants: Secondary | ICD-10-CM | POA: Diagnosis not present

## 2019-08-28 NOTE — Progress Notes (Signed)
ITALY, WARRINER ([id number]) Visit Report for 08/28/2019 Arrival Information Details Patient Name: [name], [name] Date of Service: 08/28/2019 2:00 PM Medical Record Number: [id number] Patient Account Number: [id number] Date of Birth/Sex: 1976-06-06 (43 y.o. F) Treating RN: Army Melia Primary Care Ysela Hettinger: Harrel Lemon Other Clinician: Referring Laurice Iglesia: Harrel Lemon Treating Kaidan Spengler/Extender: Tito Dine in Treatment: 2 Visit Information History Since Last Visit Added or deleted any medications: No Patient Arrived: Ambulatory Any new allergies or adverse reactions: No Arrival Time: 14:00 Had a fall or experienced change in No Accompanied By: husband activities of daily living that may affect Transfer Assistance: None risk of falls: Patient Identification Verified: Yes Signs or symptoms of abuse/neglect since last visito No Patient Has Alerts: Yes Hospitalized since last visit: No Patient Alerts: Patient on Blood Thinner Has Dressing in Place as Prescribed: Yes Xarelto Pain Present Now: No Electronic Signature(s) Signed: 08/28/2019 4:09:57 PM By: Army Melia [name]: Army Melia on 08/28/2019 14:00:18 [place], [name] ([id number]) -------------------------------------------------------------------------------- Encounter Discharge Information Details Patient Name: Krasinski, [name] Date of Service: 08/28/2019 2:00 PM Medical Record Number: [id number] Patient Account Number: [id number] Date of Birth/Sex: Apr 13, 1976 (43 y.o. F) Treating RN: Cornell Barman Primary Care Taliyah Watrous: Harrel Lemon Other Clinician: Referring Shiane Wenberg: Harrel Lemon Treating Selwyn Reason/Extender: Tito Dine in Treatment: 2 Encounter Discharge Information Items Post Procedure Vitals Discharge Condition: Stable Temperature (F): 98.3 Ambulatory Status: Ambulatory Pulse (bpm): 67 Discharge Destination: Home Respiratory Rate (breaths/min): 16 Transportation: Private  Auto Blood Pressure (mmHg): 128/74 Accompanied By: husband Schedule Follow-up Appointment: Yes Clinical Summary of Care: Electronic Signature(s) Signed: 08/28/2019 5:19:46 PM By: Gretta Cool, BSN, RN, CWS, Kim RN, BSN [name]: Gretta Cool, BSN, RN, CWS, Kim on 08/28/2019 14:33:19 Gulino, [name] ([id number]) -------------------------------------------------------------------------------- Lower Extremity Assessment Details Patient Name: Perfect, [name] Date of Service: 08/28/2019 2:00 PM Medical Record Number: [id number] Patient Account Number: [id number] Date of Birth/Sex: 1976/08/12 (43 y.o. F) Treating RN: Army Melia Primary Care Jahred Tatar: Harrel Lemon Other Clinician: Referring Torsten Weniger: Harrel Lemon Treating Kasi Lasky/Extender: Tito Dine in Treatment: 2 Edema Assessment Assessed: [Left: No] [Right: No] Edema: [Left: N] [Right: o] Calf Left: Right: Point of Measurement: 32 cm From Medial Instep 41 cm cm Ankle Left: Right: Point of Measurement: 8 cm From Medial Instep 22.5 cm cm Vascular Assessment Pulses: Dorsalis Pedis Palpable: [Left:Yes] Electronic Signature(s) Signed: 08/28/2019 4:09:57 PM By: Army Melia [name]: Army Melia on 08/28/2019 14:03:52 Viola, Porter B. ([id number]) -------------------------------------------------------------------------------- Multi Wound Chart Details Patient Name: Gaughran, [name] Date of Service: 08/28/2019 2:00 PM Medical Record Number: [id number] Patient Account Number: [id number] Date of Birth/Sex: Jul 15, 1976 (43 y.o. F) Treating RN: Cornell Barman Primary Care Markcus Lazenby: Harrel Lemon Other Clinician: Referring Patience Nuzzo: Harrel Lemon Treating Nakiea Metzner/Extender: Tito Dine in Treatment: 2 Vital Signs Height(in): 60 Pulse(bpm): 67 Weight(lbs): 204 Blood Pressure(mmHg): 128/74 Body Mass Index(BMI): 40 Temperature(F): 98.3 Respiratory Rate 16 (breaths/min): Photos: [N/A:N/A] Wound Location: Left  Lower Leg - Anterior N/A N/A Wounding Event: Blister N/A N/A Primary Etiology: Venous Leg Ulcer N/A N/A Date Acquired: 05/21/2018 N/A N/A Weeks of Treatment: 2 N/A N/A Wound Status: Open N/A N/A Measurements L x W x D 1x1.1x0.1 N/A N/A (cm) Area (cm) : 0.864 N/A N/A Volume (cm) : 0.086 N/A N/A % Reduction in Area: 15.40% N/A N/A % Reduction in Volume: 15.70% N/A N/A Classification: Partial Thickness N/A N/A Exudate Amount: Medium N/A N/A Exudate Type: Serosanguineous N/A N/A Exudate Color: red, brown N/A N/A Granulation Amount: Medium (34-66%) N/A N/A Necrotic  Amount: Medium (34-66%) N/A N/A Necrotic Tissue: Eschar N/A N/A Exposed Structures: Fat Layer (Subcutaneous N/A N/A Tissue) Exposed: Yes Fascia: No Tendon: No Muscle: No Joint: No Bone: No Epithelialization: None N/A N/A Debridement: Debridement - Excisional N/A N/A 14:47 N/A N/A TIARIA, [name] ([id number]) Pre-procedure Verification/Time Out Taken: Tissue Debrided: Necrotic/Eschar, N/A N/A Subcutaneous, Slough Level: Skin/Subcutaneous Tissue N/A N/A Debridement Area (sq cm): 1.1 N/A N/A Instrument: Curette N/A N/A Bleeding: Minimum N/A N/A Hemostasis Achieved: Pressure N/A N/A Debridement Treatment Procedure was tolerated well N/A N/A Response: Post Debridement 1x1.1x0.2 N/A N/A Measurements L x W x D (cm) Post Debridement Volume: 0.173 N/A N/A (cm) Procedures Performed: Debridement N/A N/A Treatment Notes Wound #1 (Left, Anterior Lower Leg) Notes Hydrofera Blue, ABD, 3L(L), unna to anchor Electronic Signature(s) Signed: 08/28/2019 5:14:46 PM By: Baltazar Najjar MD [name]: Baltazar Najjar on 08/28/2019 14:45:38 Belcher, [name] ([id number]) -------------------------------------------------------------------------------- Multi-Disciplinary Care Plan Details Patient Name: Troop, [name] Date of Service: 08/28/2019 2:00 PM Medical Record Number: [id number] Patient Account Number: [id number] Date  of Birth/Sex: [birth year] (43 y.o. F) Treating RN: Huel Coventry Primary Care Tykira Wachs: Marcelino Duster Other Clinician: Referring Bethenny Losee: Marcelino Duster Treating Jaylee Freeze/Extender: Altamese [place] in Treatment: 2 Active Inactive Necrotic Tissue Nursing Diagnoses: Impaired tissue integrity related to necrotic/devitalized tissue Goals: Necrotic/devitalized tissue will be minimized in the wound bed Date Initiated: 08/14/2019 Target Resolution Date: 08/21/2019 Goal Status: Active Interventions: Assess patient pain level pre-, during and post procedure and prior to discharge Treatment Activities: Apply topical anesthetic as ordered : 08/14/2019 Notes: Orientation to the Wound Care Program Nursing Diagnoses: Knowledge deficit related to the wound healing center program Goals: Patient/caregiver will verbalize understanding of the Wound Healing Center Program Date Initiated: 08/14/2019 Target Resolution Date: 08/21/2019 Goal Status: Active Interventions: Provide education on orientation to the wound center Notes: Wound/Skin Impairment Nursing Diagnoses: Impaired tissue integrity Goals: Ulcer/skin breakdown will have a volume reduction of 30% by week 4 Date Initiated: 08/14/2019 Target Resolution Date: 09/11/2019 Goal Status: Active Weissberg, CHIMERE KLINGENSMITH ([id number]) Interventions: Assess ulceration(s) every visit Treatment Activities: Referred to DME Vearl Aitken for dressing supplies : 08/14/2019 Notes: Electronic Signature(s) Signed: 08/28/2019 5:19:46 PM By: Elliot Gurney, BSN, RN, CWS, Kim RN, BSN [name]: Elliot Gurney, BSN, RN, CWS, Kim on 08/28/2019 14:27:55 Fok, [name] ([id number]) -------------------------------------------------------------------------------- Pain Assessment Details Patient Name: Canter, Daneille B. Date of Service: 08/28/2019 2:00 PM Medical Record Number: [id number] Patient Account Number: [id number] Date of Birth/Sex: 09-06-1976 (43 y.o. F) Treating RN: Rodell Perna Primary Care Javia Dillow: Marcelino Duster Other Clinician: Referring Ansley Stanwood: Marcelino Duster Treating Devone Tousley/Extender: Altamese Cochran in Treatment: 2 Active Problems Location of Pain Severity and Description of Pain Patient Has Paino No Site Locations Pain Management and Medication Current Pain Management: Electronic Signature(s) Signed: 08/28/2019 4:09:57 PM By: Rodell Perna [name]: Rodell Perna on 08/28/2019 14:00:23 Wingrove, [name] ([id number]) -------------------------------------------------------------------------------- Patient/Caregiver Education Details Patient Name: Dowell, Milcah B. Date of Service: 08/28/2019 2:00 PM Medical Record Number: [id number] Patient Account Number: [id number] Date of Birth/Gender: 11-12-1976 (43 y.o. F) Treating RN: Huel Coventry Primary Care Physician: Marcelino Duster Other Clinician: Referring Physician: Marcelino Duster Treating Physician/Extender: Altamese  in Treatment: 2 Education Assessment Education Provided To: Patient Education Topics Provided Welcome To The Wound Care Center: Handouts: Welcome To The Wound Care Center Methods: Demonstration, Explain/Verbal Wound/Skin Impairment: Handouts: Caring for Your Ulcer, Other: wound care as prescribed Methods: Demonstration, Explain/Verbal Responses: State content correctly Electronic Signature(s) Signed: 08/28/2019 5:19:46 PM By: Elliot Gurney, BSN, RN, CWS,  Selena BattenKim RN, BSN [name]: Elliot GurneyWoody, BSN, RN, CWS, Kim on 08/28/2019 14:32:14 Jantz, [name] ([id number]) -------------------------------------------------------------------------------- Wound Assessment Details Patient Name: Commisso, Victora B. Date of Service: 08/28/2019 2:00 PM Medical Record Number: [id number] Patient Account Number: [id number] Date of Birth/Sex: 03/06/1976 (43 y.o. F) Treating RN: Rodell PernaScott, Dajea Primary Care Costella Schwarz: Marcelino DusterJOHNSTON, JOHN Other Clinician: Referring Rakwon Letourneau: Marcelino DusterJOHNSTON, JOHN Treating  Shriyans Kuenzi/Extender: Altamese CarolinaOBSON, MICHAEL G Weeks in Treatment: 2 Wound Status Wound Number: 1 Primary Etiology: Venous Leg Ulcer Wound Location: Left Lower Leg - Anterior Wound Status: Open Wounding Event: Blister Date Acquired: 05/21/2018 Weeks Of Treatment: 2 Clustered Wound: No Photos Wound Measurements Length: (cm) 1 % Reduction Width: (cm) 1.1 % Reduction Depth: (cm) 0.1 Epithelializ Area: (cm) 0.864 Tunneling: Volume: (cm) 0.086 Undermining in Area: 15.4% in Volume: 15.7% ation: None No : No Wound Description Classification: Partial Thickness Foul Odor Af Exudate Amount: Medium Slough/Fibri Exudate Type: Serosanguineous Exudate Color: red, brown ter Cleansing: No no Yes Wound Bed Granulation Amount: Medium (34-66%) Exposed Structure Necrotic Amount: Medium (34-66%) Fascia Exposed: No Necrotic Quality: Eschar Fat Layer (Subcutaneous Tissue) Exposed: Yes Tendon Exposed: No Muscle Exposed: No Joint Exposed: No Bone Exposed: No Treatment Notes Wound #1 (Left, Anterior Lower Leg) Cichy, Shirin B. ([id number]) Notes Hydrofera Blue, ABD, 3L(L), unna to anchor Electronic Signature(s) Signed: 08/28/2019 4:09:57 PM By: Rodell PernaScott, Dajea [name]: Rodell PernaScott, Dajea on 08/28/2019 14:04:22 Weingartner, Rubee B. ([id number]) -------------------------------------------------------------------------------- Vitals Details Patient Name: Marinaro, Vearl B. Date of Service: 08/28/2019 2:00 PM Medical Record Number: [id number] Patient Account Number: [id number] Date of Birth/Sex: 11/12/1976 (43 y.o. F) Treating RN: Rodell PernaScott, Dajea Primary Care Lovey Crupi: Marcelino DusterJOHNSTON, JOHN Other Clinician: Referring Zyshawn Bohnenkamp: Marcelino DusterJOHNSTON, JOHN Treating Danaja Lasota/Extender: Altamese CarolinaOBSON, MICHAEL G Weeks in Treatment: 2 Vital Signs Time Taken: 14:00 Temperature (F): 98.3 Height (in): 60 Pulse (bpm): 67 Weight (lbs): 204 Respiratory Rate (breaths/min): 16 Body Mass Index (BMI): 39.8 Blood Pressure (mmHg): 128/74 Reference  Range: 80 - 120 mg / dl Electronic Signature(s) Signed: 08/28/2019 4:09:57 PM By: Rodell PernaScott, Dajea [name]: Rodell PernaScott, Dajea on 08/28/2019 14:01:06

## 2019-08-28 NOTE — Progress Notes (Signed)
[name], [name] ([id number]) Visit Report for 08/28/2019 Debridement Details Patient Name: [name], [name] Date of Service: 08/28/2019 2:00 PM Medical Record Number: [id number] Patient Account Number: [id number] Date of Birth/Sex: 11/29/1975 (43 y.o. F) Treating RN: Huel CoventryWoody, Kim Primary Care Provider: Marcelino DusterJOHNSTON, JOHN Other Clinician: Referring Provider: Marcelino DusterJOHNSTON, JOHN Treating Provider/Extender: Altamese CarolinaOBSON, MICHAEL G Weeks in Treatment: 2 Debridement Performed for Wound #1 Left,Anterior Lower Leg Assessment: Performed By: Physician Maxwell CaulOBSON, MICHAEL G, MD Debridement Type: Debridement Severity of Tissue Pre Fat layer exposed Debridement: Level of Consciousness (Pre- Awake and Alert procedure): Pre-procedure Verification/Time Yes - 14:47 Out Taken: Start Time: 14:27 Total Area Debrided (L x W): 1 (cm) x 1.1 (cm) = 1.1 (cm) Tissue and other material Viable, Non-Viable, Eschar, Slough, Subcutaneous, Slough debrided: Level: Skin/Subcutaneous Tissue Debridement Description: Excisional Instrument: Curette Bleeding: Minimum Hemostasis Achieved: Pressure End Time: 14:30 Response to Treatment: Procedure was tolerated well Level of Consciousness Awake and Alert (Post-procedure): Post Debridement Measurements of Total Wound Length: (cm) 1 Width: (cm) 1.1 Depth: (cm) 0.2 Volume: (cm) 0.173 Character of Wound/Ulcer Post Debridement: Requires Further Debridement Severity of Tissue Post Debridement: Fat layer exposed Post Procedure Diagnosis Same as Pre-procedure Electronic Signature(s) Signed: 08/28/2019 5:14:46 PM By: Baltazar Najjarobson, Michael MD Signed: 08/28/2019 5:19:46 PM By: Elliot GurneyWoody, BSN, RN, CWS, Kim RN, BSN [name]: Baltazar Najjarobson, Michael on 08/28/2019 14:46:24 [name], [name] ([id number]) -------------------------------------------------------------------------------- HPI Details Patient Name: [name], [name] Date of Service: 08/28/2019 2:00 PM Medical Record Number: [id number] Patient  Account Number: [id number] Date of Birth/Sex: 09/14/1976 (43 y.o. F) Treating RN: Huel CoventryWoody, Kim Primary Care Provider: Marcelino DusterJOHNSTON, JOHN Other Clinician: Referring Provider: Marcelino DusterJOHNSTON, JOHN Treating Provider/Extender: Altamese CarolinaOBSON, MICHAEL G Weeks in Treatment: 2 History of Present Illness HPI Description: ADMISSION 08/14/2019 This is a 43 year old woman who is here for review of a longstanding wound on the left anterior tibial area for perhaps as long as 1 year. 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 She recently had an IVC stent placed. She relates this wound to a complicated admission in June 2019. She was admitted to hospital for a planned hysterectomy she developed acute tubular necrosis postop. . A CT angiogram showed an IVC filter occlusion. [facility] I did not look out open over these records. Apparently her kidney function recovered. 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 She has not been doing anything to this wound except for episodic Neosporin. She has not been wearing compression. 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 She takes chronic Xarelto. As I understand things she is had a recent IVC stent Past medical history multiple DVTs as noted above on chronic anticoagulation with Xarelto, postoperative acute tubular necrosis I think has completely resolved per her description although I have not verified this. ABIs in our clinic were noncompressible on the left 9/30; wound surface looks better. No mechanical debridement. We have been using Iodoflex under 3 layer compression. 10/7; wound surface had some eschar on the lower part of the circumference and some debris on the surface. I have been using Iodoflex. [facility] was able to find arterial studies although they did not look  at anything below the knee. She does have a femoral artery stent which was felt at that time to be widely patent. The also looked for a DVT at  that time the common femoral femoral veins were fully compressible Electronic Signature(s) Signed: 08/28/2019 5:14:46 PM By: Baltazar Najjar MD [name]: Baltazar Najjar on 08/28/2019 14:48:05 [name] ([id number]) -------------------------------------------------------------------------------- Physical Exam Details Patient Name: [name], [name] Date of Service: 08/28/2019 2:00 PM Medical Record Number: [id number] Patient Account Number: [id number] Date of Birth/Sex: 1975-12-02 (43 y.o. F) Treating RN: Huel Coventry Primary Care Provider: Marcelino Duster Other Clinician: Referring Provider: Marcelino Duster Treating Provider/Extender: Altamese [place] in Treatment: 2 Constitutional Sitting or standing Blood Pressure is within target range for patient.. Pulse regular and within target range for patient.[name] Respirations regular, non-labored and within target range.. Temperature is normal and within the target range for the patient.[name] appears in no distress. Cardiovascular Pedal pulses are palpable on the left.. Integumentary (Hair, Skin) Changes relating to hemosiderin chronic venous hypertension. Psychiatric No evidence of depression, anxiety, or agitation. Calm, cooperative, and communicative. Appropriate interactions and affect.. Notes Wound exam; small circular wound in the left mid tibia I removed some black eschar on the lower part of the wound circumference and some debris from the wound surface hemostasis with direct pressure. No evidence of surrounding infection Electronic Signature(s) Signed: 08/28/2019 5:14:46 PM By: Baltazar Najjar MD [name]: Baltazar Najjar on 08/28/2019 14:49:12 [place]terpool, Marcellus [name] ([id number]) -------------------------------------------------------------------------------- Physician Orders  Details Patient Name: [name], [name] Date of Service: 08/28/2019 2:00 PM Medical Record Number: [id number] Patient Account Number: [id number] Date of Birth/Sex: January 09, 1976 (43 y.o. F) Treating RN: Huel Coventry Primary Care Provider: Marcelino Duster Other Clinician: Referring Provider: Marcelino Duster Treating Provider/Extender: Altamese [place] in Treatment: 2 Verbal / Phone Orders: No Diagnosis Coding Wound Cleansing Wound #1 Left,Anterior Lower Leg o Clean wound with Normal Saline. o May shower with protection. - Cast protector to keep wrap dry Anesthetic (add to Medication List) Wound #1 Left,Anterior Lower Leg o Topical Lidocaine 4% cream applied to wound bed prior to debridement (In Clinic Only). Primary Wound Dressing Wound #1 Left,Anterior Lower Leg o Hydrafera Blue Ready Transfer Secondary Dressing Wound #1 Left,Anterior Lower Leg o ABD pad Dressing Change Frequency Wound #1 Left,Anterior Lower Leg o Change dressing every week o Other: - Nurse Visit Friday Follow-up Appointments Wound #1 Left,Anterior Lower Leg o Return Appointment in 1 week. o Nurse Visit as needed Edema Control Wound #1 Left,Anterior Lower Leg o 3 Layer Compression System - Left Lower Extremity - Unna to anchor Electronic Signature(s) Signed: 08/28/2019 5:14:46 PM By: Baltazar Najjar MD Signed: 08/28/2019 5:19:46 PM By: Elliot Gurney, BSN, RN, CWS, Kim RN, BSN [name]: Elliot Gurney, BSN, RN, CWS, Kim on 08/28/2019 14:31:15 Mcgillicuddy, Marcellus [name] ([id number]) -------------------------------------------------------------------------------- Problem List Details Patient Name: [name], [name] Date of Service: 08/28/2019 2:00 PM Medical Record Number: [id number] Patient Account Number: [id number] Date of Birth/Sex: 06-Sep-1976 (43 y.o. 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 Date of Service: 08/28/2019 2:00 PM Medical Record Number: [id number] Patient  Account Number: [id number] Date of Birth/Sex: 10/26/1976 (43 y.o. F) Treating RN: Cornell Barman Primary Care Provider: Harrel Lemon Other Clinician: Referring Provider: Harrel Lemon Treating Provider/Extender: Tito Dine in Treatment: 2 Subjective History of Present Illness (HPI) ADMISSION 08/14/2019 This is a 43 year old woman who is here for review of a longstanding wound on the left anterior tibial area for perhaps as long as 1 year. 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 She recently had an IVC stent placed. She relates this wound to a complicated admission in June 2019. She was admitted to hospital for a planned hysterectomy she developed acute tubular necrosis postop. . A CT angiogram showed an IVC filter occlusion. She was transferred to [facility]. I did not look out open over these records. Apparently her kidney function recovered. 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 She has not been doing anything to this wound except for episodic Neosporin. She  has not been wearing compression. 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 As I understand things she is had a recent IVC stent Past medical history multiple DVTs as noted above on chronic anticoagulation with Xarelto, postoperative acute tubular necrosis I think has completely resolved per her description although I have not verified this. ABIs in our clinic were noncompressible on the left 9/30; wound surface looks better. No mechanical debridement. We have been using Iodoflex under 3 layer compression. 10/7; wound surface had some eschar on the lower part of the circumference and some debris on the surface. I have been using Iodoflex. Changed to [facility] today Looking through her records at [facility] I was able to find arterial studies although they did not look at anything below the knee. She does have a femoral artery stent which was felt at that time to be widely patent. The also looked for a DVT at that time the common femoral femoral veins were fully compressible Objective Constitutional Sitting or standing Blood Pressure is within target range for patient.. Pulse regular and within target range for patient.[name] Respirations regular, non-labored and within target range.. Temperature is normal and within the target range for the patient.[name] appears in no distress. Vitals Time Taken: 2:00 PM, Height: 60 in, Weight: 204 lbs, BMI: 39.8, Temperature: 98.3 F, Pulse: 67 bpm, Respiratory Rate: 16 breaths/min, Blood Pressure: 128/74 mmHg. Cardiovascular Pedal pulses are palpable on the left.[name] [name], [name] ([id number]) Psychiatric No evidence of depression, anxiety, or agitation. Calm, cooperative, and communicative. Appropriate interactions and affect.. General Notes: Wound exam; small circular wound in the left mid tibia I removed some black eschar on the lower part of the wound circumference and some debris from the wound surface hemostasis with direct pressure. No evidence of  surrounding infection Integumentary (Hair, Skin) Changes relating to hemosiderin chronic venous hypertension. Wound #1 status is Open. Original cause of wound was Blister. The wound is located on the Left,Anterior Lower Leg. The wound measures 1cm length x 1.1cm width x 0.1cm depth; 0.864cm^2 area and 0.086cm^3 volume. There is Fat Layer (Subcutaneous Tissue) Exposed exposed. There is no tunneling or undermining noted. There is a medium amount of serosanguineous drainage noted. There is medium (34-66%) granulation within the wound bed. There is a medium (34-66%) amount of necrotic tissue within the wound bed including Eschar. 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 There was a Excisional Skin/Subcutaneous Tissue Debridement with a total area of 1.1 sq cm performed by Maxwell Caul, MD. With the following instrument(s): Curette to remove Viable and Non-Viable tissue/material. Material removed includes Eschar, Subcutaneous Tissue, and Slough. No specimens were taken. A time out was conducted at 14:47, prior to the start of the procedure. A Minimum amount of bleeding was controlled with Pressure. The procedure was tolerated well. Post Debridement Measurements: 1cm length x 1.1cm width x 0.2cm depth; 0.173cm^3 volume. Character of Wound/Ulcer Post Debridement requires further debridement. Severity of Tissue Post Debridement is: Fat layer exposed. Post procedure Diagnosis Wound #1: Same as Pre-Procedure Plan Wound Cleansing: Wound #1 Left,Anterior Lower Leg: [name], [name] ([id number]) Clean wound with Normal Saline. May shower with  protection. - Cast protector to keep wrap dry Anesthetic (add to Medication List): Wound #1 Left,Anterior Lower Leg: Topical Lidocaine 4% cream applied to wound bed prior to debridement (In Clinic Only). Primary Wound Dressing: Wound #1 Left,Anterior Lower Leg: Hydrafera Blue Ready Transfer Secondary Dressing: Wound #1 Left,Anterior Lower Leg: ABD pad Dressing Change Frequency: Wound #1 Left,Anterior Lower Leg: Change dressing every week Other: - Nurse Visit Friday Follow-up Appointments: Wound #1 Left,Anterior Lower Leg: Return Appointment in 1 week. Nurse Visit as needed Edema Control: Wound #1 Left,Anterior Lower Leg: 3 Layer Compression System - Left Lower Extremity - Unna to anchor 1. Left anterior lower leg. Change the primary dressing to [facility] still under the same compression 2. No evidence of infection 3. I really do not believe the arterial insufficiency is playing a role here. Electronic Signature(s) Signed: 08/28/2019 5:14:46 PM By: Baltazar Najjar MD [name]: Baltazar Najjar on 08/28/2019 14:50:11 Scatena, Marcellus [name] ([id number]) -------------------------------------------------------------------------------- SuperBill Details Patient Name: [name], [name] Date of Service: 08/28/2019 Medical Record Number: [id number] Patient Account Number: [id number] Date of Birth/Sex: 1976-05-23 (43 y.o. 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 Facility Procedures CPT4 Code Description: [id number] - DEB SUBQ TISSUE 20 SQ CM/< ICD-10 Diagnosis Description L97.821  Non-pressure chronic ulcer of other part of left lower leg limit I87.312 Chronic venous hypertension (idiopathic) with ulcer of left lowe Modifier: ed to breakdown r extremity Quantity: 1 of skin Physician Procedures CPT4 Code Description: [id number] 11042 - WC PHYS SUBQ TISS 20 SQ CM ICD-10 Diagnosis Description 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 I87.312 Chronic venous hypertension (idiopathic) with ulcer of left lowe Modifier: ed to breakdown r extremity Quantity: 1 of skin Electronic Signature(s) Signed: 08/28/2019 5:14:46 PM By: Baltazar Najjar MD [name]: Baltazar Najjar on 08/28/2019 14:50:29

## 2019-09-04 ENCOUNTER — Encounter: Payer: BC Managed Care – PPO | Admitting: Internal Medicine

## 2019-09-04 ENCOUNTER — Other Ambulatory Visit: Payer: Self-pay

## 2019-09-04 DIAGNOSIS — I87312 Chronic venous hypertension (idiopathic) with ulcer of left lower extremity: Secondary | ICD-10-CM | POA: Diagnosis not present

## 2019-09-05 NOTE — Progress Notes (Signed)
JOPLIN, CANTY ([id number]) Visit Report for 09/04/2019 HPI Details Patient Name: [name], [name]. Date of Service: 09/04/2019 2:00 PM Medical Record Number: [id number] Patient Account Number: [id number] Date of Birth/Sex: 03-05-1976 (43 y.o. F) Treating RN: Huel Coventry Primary Care Provider: Marcelino Duster Other Clinician: Referring Provider: Marcelino Duster Treating Provider/Extender: Altamese [place] in Treatment: 3 History of Present Illness HPI Description: ADMISSION 08/14/2019 This is a 43 year old woman who is here for review of a longstanding wound on the left anterior tibial area for perhaps as long as 1 year. 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 She recently had an IVC stent placed. She relates this wound to a complicated admission in June 2019. She was admitted to hospital for a planned hysterectomy she developed acute tubular necrosis postop. . A CT angiogram showed an IVC filter occlusion. She was transferred to [facility]. I did not look out open over these records. Apparently her kidney function recovered. 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 She has not been doing anything to this wound except for episodic Neosporin. She has not been wearing compression. 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 As I understand things she is had a recent IVC stent Past medical history multiple DVTs as noted above on chronic anticoagulation with Xarelto, postoperative acute tubular necrosis I think has completely resolved per her description although I have not verified this. ABIs in our clinic were noncompressible on the left 9/30; wound surface looks better. No mechanical debridement. We have been using Iodoflex under 3 layer compression. 10/7; wound surface had some eschar on the lower part of the circumference and some  debris on the surface. I have been using Iodoflex. Changed to [facility] today Looking through her records at [facility] I was able to find arterial studies although they did not look at anything below the knee. She does have a femoral artery stent which was felt at that time to be widely patent. The also looked for a DVT at that time the common femoral femoral veins were fully compressible 10/14; better looking wound surface that is measuring slightly smaller. Using Hydrofera Blue. They corrected me today on the femoral artery stent that I stated on 10/7 and said it was a vein stent tell need to have a better look at her [facility] records. The wound is smaller and looks healthy Electronic Signature(s) Signed: 09/04/2019 5:10:21 PM By: Baltazar Najjar MD [name]: Baltazar Najjar on 09/04/2019 15:38:54 Base, Amy Scott ([id number]) -------------------------------------------------------------------------------- Physical Exam Details Patient Name: [name], [name]. Date of Service: 09/04/2019 2:00 PM Medical Record Number: [id number] Patient Account Number: [id number] Date of Birth/Sex: [birth year] (43 y.o. F) Treating RN: Huel Coventry Primary Care Provider: Marcelino Duster Other Clinician: Referring Provider: Marcelino Duster Treating Provider/Extender: Altamese  in Treatment: 3 Constitutional Sitting or standing Blood Pressure is within target range for patient.. Pulse regular and within target range for patient.[name] Respirations regular, non-labored and within target range.. Temperature is normal and within the target range for the patient.[name] appears in no distress. Eyes Conjunctivae clear. No discharge. Respiratory Respiratory effort is easy and symmetric bilaterally. Rate is normal at rest and on room air.. Cardiovascular Pulses palpable. Integumentary (Hair, Skin) Changes of chronic venous insufficiency probably some degree of lymphedema in the right leg. Notes Wound exam; small  circular  wound in the left mid tibia. The area is better. No debridement is required. Surrounded by hemosiderin staining Electronic Signature(s) Signed: 09/04/2019 5:10:21 PM By: Baltazar Najjar MD [name]: Baltazar Najjar on 09/04/2019 15:53:18 [place], Amy Scott ([id number]) -------------------------------------------------------------------------------- Physician Orders Details Patient Name: [name], [name]. Date of Service: 09/04/2019 2:00 PM Medical Record Number: [id number] Patient Account Number: [id number] Date of Birth/Sex: 10-30-1976 (43 y.o. F) Treating RN: Huel Coventry Primary Care Provider: Marcelino Duster Other Clinician: Referring Provider: Marcelino Duster Treating Provider/Extender: Altamese [place] in Treatment: 3 Verbal / Phone Orders: No Diagnosis Coding Wound Cleansing Wound #1 Left,Anterior Lower Leg o Clean wound with Normal Saline. o May shower with protection. - Cast protector to keep wrap dry Anesthetic (add to Medication List) Wound #1 Left,Anterior Lower Leg o Topical Lidocaine 4% cream applied to wound bed prior to debridement (In Clinic Only). Primary Wound Dressing Wound #1 Left,Anterior Lower Leg o Hydrafera Blue Ready Transfer Secondary Dressing Wound #1 Left,Anterior Lower Leg o ABD pad Dressing Change Frequency Wound #1 Left,Anterior Lower Leg o Change dressing every week Follow-up Appointments Wound #1 Left,Anterior Lower Leg o Return Appointment in 1 week. o Nurse Visit as needed Edema Control Wound #1 Left,Anterior Lower Leg o 3 Layer Compression System - Left Lower Extremity - Unna to anchor Notes Order compression stockings soon Electronic Signature(s) Signed: 09/04/2019 5:10:21 PM By: Baltazar Najjar MD Signed: 09/04/2019 5:44:53 PM By: Elliot Gurney, BSN, RN, CWS, Kim RN, BSN [name]: Elliot Gurney, BSN, RN, CWS, Kim on 09/04/2019 14:36:02 Delage, Amy Scott  ([id number]) -------------------------------------------------------------------------------- Problem List Details Patient Name: [name], [name]. Date of Service: 09/04/2019 2:00 PM Medical Record Number: [id number] Patient Account Number: [id number] Date of Birth/Sex: [birth year] (43 y.o. 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 Date of Service: 09/04/2019 2:00 PM Medical Record Number: [id number] Patient Account Number: [id number] Date of Birth/Sex: July 27, 1976 (43 y.o. F) Treating RN: Huel Coventry Primary Care Provider: Marcelino Duster Other Clinician: Referring Provider: Marcelino Duster Treating Provider/Extender: Altamese [place] in Treatment: 3 Subjective History of Present Illness (HPI) ADMISSION 08/14/2019 This is a 43 year old woman who is here for review of a longstanding wound on the left anterior tibial area for perhaps as long as 1 year. 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 She recently had an IVC stent placed. She relates this wound to a  complicated admission in June 2019. She was admitted to hospital for a planned hysterectomy she developed acute tubular necrosis postop. . A CT angiogram showed an IVC filter occlusion. She was transferred to [facility]. I did not look out open over these records. Apparently her kidney function recovered. 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 She has not been doing anything to this wound except for  episodic Neosporin. She has not been wearing compression. 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 As I understand things she is had a recent IVC stent Past medical history multiple DVTs as noted above on chronic anticoagulation with Xarelto, postoperative acute tubular necrosis I think has completely resolved per her description although I have not verified this. ABIs in our clinic were noncompressible on the left 9/30; wound surface looks better. No mechanical debridement. We have been using Iodoflex under 3 layer compression. 10/7; wound surface had some eschar on the lower part of the circumference and some debris on the surface. I have been using Iodoflex. Changed to [facility] today Looking through her records at [facility] I was able to find arterial studies although they did not look at anything below the knee. She does have a femoral artery stent which was felt at that time to be widely patent. The also looked for a DVT at that time the common femoral femoral veins were fully compressible 10/14; better looking wound surface that is measuring slightly smaller. Using Hydrofera Blue. They corrected me today on the femoral artery stent that I stated on 10/7 and said it was a vein stent tell need to have a better look at her [place] records. The wound is smaller and looks healthy Objective Constitutional Sitting or standing Blood Pressure is within target range for patient.. Pulse regular and within target range for patient.[name] Respirations regular,  non-labored and within target range.. Temperature is normal and within the target range for the patient.[name] appears in no distress. Vitals Time Taken: 2:00 PM, Height: 60 in, Weight: 204 lbs, BMI: 39.8, Temperature: 98.7 F, Pulse: 66 bpm, Respiratory Rate: 16 breaths/min, Blood Pressure: 126/70 mmHg. [name], [name] Kitchen ([id number]) Eyes Conjunctivae clear. No discharge. Respiratory Respiratory effort is easy and symmetric bilaterally. Rate is normal at rest and on room air.. Cardiovascular Pulses palpable. General Notes: Wound exam; small circular wound in the left mid tibia. The area is better. No debridement is required. Surrounded by hemosiderin staining Integumentary (Hair, Skin) Changes of chronic venous insufficiency probably some degree of lymphedema in the right leg. Wound #1 status is Open. Original cause of wound was Blister. The wound is located on the Left,Anterior Lower Leg. The wound measures 0.9cm length x 0.7cm width x 0.1cm depth; 0.495cm^2 area and 0.049cm^3 volume. There is Fat Layer (Subcutaneous Tissue) Exposed exposed. There is no tunneling or undermining noted. There is a medium amount of serosanguineous drainage noted. The wound margin is flat and intact. There is large (67-100%) red granulation within the wound bed. There is a small (1-33%) amount of necrotic tissue within the wound bed including Adherent Slough. 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 May shower with protection. - Cast protector to keep wrap dry Anesthetic (add to Medication List): Wound #1 Left,Anterior Lower Leg: Topical Lidocaine 4% cream applied to wound bed prior to debridement (In Clinic Only). Primary Wound Dressing: Wound  #1 Left,Anterior Lower Leg: Hydrafera Blue Ready Transfer Secondary Dressing: Wound #1 Left,Anterior Lower Leg: ABD pad Dressing Change Frequency: Wound #1 Left,Anterior Lower Leg: Change dressing every week [name], [name]. ([id number]) Follow-up Appointments: Wound #1 Left,Anterior Lower Leg: Return Appointment in 1 week. Nurse Visit as needed Edema Control: Wound #1 Left,Anterior Lower Leg: 3 Layer  Compression System - Left Lower Extremity - Unna to anchor General Notes: Order compression stockings soon 1. I am continuing with Hydrofera Blue/ABDs under 3 layer compression were making gradual progress with this 2. Need to review of the vascular status in the left leg [facility] vis--vis arterial issues Electronic Signature(s) Signed: 09/04/2019 5:10:21 PM By: Baltazar Najjarobson, Graiden Henes MD [name]: Baltazar Najjarobson, Delainie Chavana on 09/04/2019 15:54:33 [name], [name]. ([id number]) -------------------------------------------------------------------------------- SuperBill Details Patient Name: [name], [name]. Date of Service: 09/04/2019 Medical Record Number: [id number] Patient Account Number: [id number] Date of Birth/Sex: 04/20/1976 (43 y.o. 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 Facility Procedures CPT4 Code: [id number] Description: (Facility Use Only) [id number] - APPLY MULTLAY COMPRS LWR LT LEG Modifier: Quantity: 1 Physician Procedures CPT4 Code Description: [id number] - WC PHYS LEVEL 3 - EST PT ICD-10 Diagnosis Description L97.821 Non-pressure chronic ulcer of other part of left lower leg limi I87.312  Chronic venous hypertension (idiopathic) with ulcer of left low D68.69 Other  thrombophilia Modifier: ted to breakdown er extremity Quantity: 1 of skin Electronic Signature(s) Signed: 09/04/2019 5:10:21 PM By: Baltazar Najjarobson, Gwyn Mehring MD [name]: Baltazar Najjarobson, Mauriana Dann on 09/04/2019 15:54:59

## 2019-09-05 NOTE — Progress Notes (Signed)
ALEESHA, RINGSTAD ([id number]) Visit Report for 09/04/2019 Arrival Information Details Patient Name: [name], [name] Date of Service: 09/04/2019 2:00 PM Medical Record Number: [id number] Patient Account Number: [id number] Date of Birth/Sex: 12-14-1975 (43 y.o. F) Treating RN: Huel Coventry Primary Care Apryll Hinkle: Marcelino Duster Other Clinician: Referring Antanasia Kaczynski: Marcelino Duster Treating Zianna Dercole/Extender: Altamese [place] in Treatment: 3 Visit Information History Since Last Visit Added or deleted any medications: No Patient Arrived: Ambulatory Any new allergies or adverse reactions: No Arrival Time: 14:01 Had a fall or experienced change in No Accompanied By: husband activities of daily living that may affect Transfer Assistance: None risk of falls: Patient Identification Verified: Yes Signs or symptoms of abuse/neglect since last visito No Secondary Verification Process Yes Hospitalized since last visit: No Completed: Implantable device outside of the clinic excluding No Patient Has Alerts: Yes cellular tissue based products placed in the center Patient Alerts: Patient on Blood since last visit: Thinner Has Dressing in Place as Prescribed: Yes Xarelto Pain Present Now: No Electronic Signature(s) Signed: 09/04/2019 4:52:13 PM By: Dayton Martes RCP, RRT, CHT [name]: Dayton Martes on 09/04/2019 14:03:04 Crymes, [name] ([id number]) -------------------------------------------------------------------------------- Encounter Discharge Information Details Patient Name: Roberg, Emelly B. Date of Service: 09/04/2019 2:00 PM Medical Record Number: [id number] Patient Account Number: [id number] Date of Birth/Sex: 05-03-1976 (43 y.o. F) Treating RN: Huel Coventry Primary Care Berlyn Malina: Marcelino Duster Other Clinician: Referring Clovia Reine: Marcelino Duster Treating Rhys Lichty/Extender: Altamese [place] in Treatment: 3 Encounter Discharge  Information Items Discharge Condition: Stable Ambulatory Status: Ambulatory Discharge Destination: Home Transportation: Private Auto Schedule Follow-up Appointment: Yes Clinical Summary of Care: Electronic Signature(s) Signed: 09/04/2019 5:44:53 PM By: Elliot Gurney, BSN, RN, CWS, Kim RN, BSN [name]: Elliot Gurney, BSN, RN, CWS, Kim on 09/04/2019 14:37:47 Weinmann, [name] ([id number]) -------------------------------------------------------------------------------- Lower Extremity Assessment Details Patient Name: Kistner, Cheryllynn B. Date of Service: 09/04/2019 2:00 PM Medical Record Number: [id number] Patient Account Number: [id number] Date of Birth/Sex: Aug 06, 1976 (42 y.o. F) Treating RN: Arnette Norris Primary Care Destry Bezdek: Marcelino Duster Other Clinician: Referring Jesicca Dipierro: Marcelino Duster Treating Mariya Mottley/Extender: Altamese [place] in Treatment: 3 Edema Assessment Assessed: [Left: No] [Right: No] [Left: Edema] [Right: :] Calf Left: Right: Point of Measurement: 32 cm From Medial Instep 42 cm cm Ankle Left: Right: Point of Measurement: 8 cm From Medial Instep 22.5 cm cm Vascular Assessment Pulses: Dorsalis Pedis Palpable: [Left:Yes] Posterior Tibial Palpable: [Left:Yes] Electronic Signature(s) Signed: 09/04/2019 4:37:37 PM By: Arnette Norris [name]: Arnette Norris on 09/04/2019 14:14:14 Gover, Mark B. ([id number]) -------------------------------------------------------------------------------- Multi Wound Chart Details Patient Name: Rekowski, Maryah B. Date of Service: 09/04/2019 2:00 PM Medical Record Number: [id number] Patient Account Number: [id number] Date of Birth/Sex: 1976-08-15 (43 y.o. F) Treating RN: Huel Coventry Primary Care Virna Livengood: Marcelino Duster Other Clinician: Referring Krystan Northrop: Marcelino Duster Treating Haile Toppins/Extender: Altamese [place] in Treatment: 3 Vital Signs Height(in): 60 Pulse(bpm): 66 Weight(lbs): 204 Blood Pressure(mmHg):  126/70 Body Mass Index(BMI): 40 Temperature(F): 98.7 Respiratory Rate 16 (breaths/min): Photos: [N/A:N/A] Wound Location: Left Lower Leg - Anterior N/A N/A Wounding Event: Blister N/A N/A Primary Etiology: Venous Leg Ulcer N/A N/A Date Acquired: 05/21/2018 N/A N/A Weeks of Treatment: 3 N/A N/A Wound Status: Open N/A N/A Measurements L x W x D 0.9x0.7x0.1 N/A N/A (cm) Area (cm) : 0.495 N/A N/A Volume (cm) : 0.049 N/A N/A % Reduction in Area: 51.50% N/A N/A % Reduction in Volume: 52.00% N/A N/A Classification: Partial Thickness N/A N/A Exudate Amount: Medium N/A N/A Exudate Type: Serosanguineous N/A N/A Exudate  Color: red, brown N/A N/A Wound Margin: Flat and Intact N/A N/A Granulation Amount: Large (67-100%) N/A N/A Granulation Quality: Red N/A N/A Necrotic Amount: Small (1-33%) N/A N/A Exposed Structures: Fat Layer (Subcutaneous N/A N/A Tissue) Exposed: Yes Fascia: No Tendon: No Muscle: No Joint: No Bone: No Epithelialization: Small (1-33%) N/A N/A Maple, Palma B. ([id number]) Treatment Notes Wound #1 (Left, Anterior Lower Leg) Notes Hydrofera Blue, ABD, 3L(L), unna to anchor Electronic Signature(s) Signed: 09/04/2019 5:10:21 PM By: Baltazar Najjarobson, Michael MD [name]: Baltazar Najjarobson, Michael on 09/04/2019 15:35:30 Meaney, [name] ([id number]) -------------------------------------------------------------------------------- Multi-Disciplinary Care Plan Details Patient Name: Mccarey, Shanele B. Date of Service: 09/04/2019 2:00 PM Medical Record Number: [id number] Patient Account Number: [id number] Date of Birth/Sex: 01/25/1976 (43 y.o. F) Treating RN: Huel CoventryWoody, Kim Primary Care Lissa Rowles: Marcelino DusterJOHNSTON, JOHN Other Clinician: Referring Fany Cavanaugh: Marcelino DusterJOHNSTON, JOHN Treating Teasia Zapf/Extender: Altamese CarolinaOBSON, MICHAEL G Weeks in Treatment: 3 Active Inactive Necrotic Tissue Nursing Diagnoses: Impaired tissue integrity related to necrotic/devitalized tissue Goals: Necrotic/devitalized tissue will be  minimized in the wound bed Date Initiated: 08/14/2019 Target Resolution Date: 08/21/2019 Goal Status: Active Interventions: Assess patient pain level pre-, during and post procedure and prior to discharge Treatment Activities: Apply topical anesthetic as ordered : 08/14/2019 Notes: Orientation to the Wound Care Program Nursing Diagnoses: Knowledge deficit related to the wound healing center program Goals: Patient/caregiver will verbalize understanding of the Wound Healing Center Program Date Initiated: 08/14/2019 Target Resolution Date: 08/21/2019 Goal Status: Active Interventions: Provide education on orientation to the wound center Notes: Wound/Skin Impairment Nursing Diagnoses: Impaired tissue integrity Goals: Ulcer/skin breakdown will have a volume reduction of 30% by week 4 Date Initiated: 08/14/2019 Target Resolution Date: 09/11/2019 Goal Status: Active Fils, [name] ([id number]) Interventions: Assess ulceration(s) every visit Treatment Activities: Referred to DME Neaveh Belanger for dressing supplies : 08/14/2019 Notes: Electronic Signature(s) Signed: 09/04/2019 5:44:53 PM By: Elliot GurneyWoody, BSN, RN, CWS, Kim RN, BSN [name]: Elliot GurneyWoody, BSN, RN, CWS, Kim on 09/04/2019 14:34:31 Thelin, [name] ([id number]) -------------------------------------------------------------------------------- Pain Assessment Details Patient Name: Wulff, Tyannah B. Date of Service: 09/04/2019 2:00 PM Medical Record Number: [id number] Patient Account Number: [id number] Date of Birth/Sex: 12/13/1975 (43 y.o. F) Treating RN: Huel CoventryWoody, Kim Primary Care Lashawna Poche: Marcelino DusterJOHNSTON, JOHN Other Clinician: Referring Cheryln Balcom: Marcelino DusterJOHNSTON, JOHN Treating Tannah Dreyfuss/Extender: Altamese CarolinaOBSON, MICHAEL G Weeks in Treatment: 3 Active Problems Location of Pain Severity and Description of Pain Patient Has Paino No Site Locations Pain Management and Medication Current Pain Management: Electronic Signature(s) Signed: 09/04/2019 4:52:13 PM By:  Sallee ProvencalWallace, RCP,RRT,CHT, Sallie RCP, RRT, CHT Signed: 09/04/2019 5:44:53 PM By: Elliot GurneyWoody, BSN, RN, CWS, Kim RN, BSN [name]: Dayton MartesWallace, RCP,RRT,CHT, Sallie on 09/04/2019 14:03:13 Chizek, [name] ([id number]) -------------------------------------------------------------------------------- Patient/Caregiver Education Details Patient Name: Laughner, Dinah BeersGINGER B. Date of Service: 09/04/2019 2:00 PM Medical Record Number: [id number] Patient Account Number: [id number] Date of Birth/Gender: 10/24/1976 (43 y.o. F) Treating RN: Huel CoventryWoody, Kim Primary Care Physician: Marcelino DusterJOHNSTON, JOHN Other Clinician: Referring Physician: Marcelino DusterJOHNSTON, JOHN Treating Physician/Extender: Altamese CarolinaOBSON, MICHAEL G Weeks in Treatment: 3 Education Assessment Education Provided To: Patient Education Topics Provided Venous: Handouts: Controlling Swelling with Compression Stockings Methods: Demonstration, Explain/Verbal Responses: Return demonstration correctly, State content correctly Wound/Skin Impairment: Handouts: Caring for Your Ulcer Methods: Demonstration, Explain/Verbal Responses: State content correctly Electronic Signature(s) Signed: 09/04/2019 5:44:53 PM By: Elliot GurneyWoody, BSN, RN, CWS, Kim RN, BSN [name]: Elliot GurneyWoody, BSN, RN, CWS, Kim on 09/04/2019 14:37:13 Sandy, [name] ([id number]) -------------------------------------------------------------------------------- Wound Assessment Details Patient Name: Savini, Mathilde B. Date of Service: 09/04/2019 2:00 PM Medical Record Number: [id number] Patient Account Number: [id number] Date of Birth/Sex: 04/05/1976 (43  y.o. F) Treating RN: Harold Barban Primary Care Kolbey Teichert: Harrel Lemon Other Clinician: Referring Lyndi Holbein: Harrel Lemon Treating Annaleigha Woo/Extender: Tito Dine in Treatment: 3 Wound Status Wound Number: 1 Primary Etiology: Venous Leg Ulcer Wound Location: Left Lower Leg - Anterior Wound Status: Open Wounding Event: Blister Date Acquired: 05/21/2018 Weeks Of  Treatment: 3 Clustered Wound: No Photos Wound Measurements Length: (cm) 0.9 Width: (cm) 0.7 Depth: (cm) 0.1 Area: (cm) 0.495 Volume: (cm) 0.049 % Reduction in Area: 51.5% % Reduction in Volume: 52% Epithelialization: Small (1-33%) Tunneling: No Undermining: No Wound Description Classification: Partial Thickness Foul Odor A Wound Margin: Flat and Intact Slough/Fibr Exudate Amount: Medium Exudate Type: Serosanguineous Exudate Color: red, brown fter Cleansing: No ino Yes Wound Bed Granulation Amount: Large (67-100%) Exposed Structure Granulation Quality: Red Fascia Exposed: No Necrotic Amount: Small (1-33%) Fat Layer (Subcutaneous Tissue) Exposed: Yes Necrotic Quality: Adherent Slough Tendon Exposed: No Muscle Exposed: No Joint Exposed: No Bone Exposed: No Treatment Notes Gowin, Anah B. ([id number]) Wound #1 (Left, Anterior Lower Leg) Notes Hydrofera Blue, ABD, 3L(L), unna to anchor Electronic Signature(s) Signed: 09/04/2019 4:37:37 PM By: Harold Barban [name]: Harold Barban on 09/04/2019 14:17:57 Zurcher, [name] ([id number]) -------------------------------------------------------------------------------- Vitals Details Patient Name: Suares, Airiana B. Date of Service: 09/04/2019 2:00 PM Medical Record Number: [id number] Patient Account Number: [id number] Date of Birth/Sex: 1976/07/12 (43 y.o. F) Treating RN: Cornell Barman Primary Care Serenah Mill: Harrel Lemon Other Clinician: Referring Prophet Renwick: Harrel Lemon Treating Salih Williamson/Extender: Tito Dine in Treatment: 3 Vital Signs Time Taken: 14:00 Temperature (F): 98.7 Height (in): 60 Pulse (bpm): 66 Weight (lbs): 204 Respiratory Rate (breaths/min): 16 Body Mass Index (BMI): 39.8 Blood Pressure (mmHg): 126/70 Reference Range: 80 - 120 mg / dl Electronic Signature(s) Signed: 09/04/2019 4:52:13 PM By: Lorine Bears RCP, RRT, CHT [name]: Lorine Bears on  09/04/2019 14:03:52

## 2019-09-11 ENCOUNTER — Other Ambulatory Visit: Payer: Self-pay

## 2019-09-11 ENCOUNTER — Encounter: Payer: BC Managed Care – PPO | Admitting: Internal Medicine

## 2019-09-11 DIAGNOSIS — I87312 Chronic venous hypertension (idiopathic) with ulcer of left lower extremity: Secondary | ICD-10-CM | POA: Diagnosis not present

## 2019-09-12 NOTE — Progress Notes (Signed)
[name], [name] ([id number]) Visit Report for 09/11/2019 Arrival Information Details Patient Name: [name] [name], [name] B. Date of Service: 09/11/2019 2:00 PM Medical Record Number: [id number] Patient Account Number: [id number] Date of Birth/Sex: [birth year] (43 y.o. F) Treating RN: Rodell Perna Primary Care Jannine Abreu: Marcelino Duster Other Clinician: Referring Meshach Perry: Marcelino Duster Treating Janeann Paisley/Extender: Altamese [place] in Treatment: 4 Visit Information History Since Last Visit Added or deleted any medications: No Patient Arrived: Ambulatory Any new allergies or adverse reactions: No Arrival Time: 14:04 Had a fall or experienced change in No Accompanied By: son activities of daily living that may affect Transfer Assistance: None risk of falls: Patient Identification Verified: Yes Signs or symptoms of abuse/neglect since last visito No Patient Has Alerts: Yes Hospitalized since last visit: No Patient Alerts: Patient on Blood Thinner Has Dressing in Place as Prescribed: Yes Xarelto Pain Present Now: No Electronic Signature(s) Signed: 09/11/2019 3:15:35 PM By: Rodell Perna [name]: Rodell Perna on 09/11/2019 14:04:45 [name] [name] [name] [name] ([id number]) -------------------------------------------------------------------------------- Encounter Discharge Information Details Patient Name: [name] [name], [name] B. Date of Service: 09/11/2019 2:00 PM Medical Record Number: [id number] Patient Account Number: [id number] Date of Birth/Sex: 07-09-1976 (43 y.o. F) Treating RN: Huel Coventry Primary Care Naraly Fritcher: Marcelino Duster Other Clinician: Referring Yer Castello: Marcelino Duster Treating Jayleene Glaeser/Extender: Altamese [place] in Treatment: 4 Encounter Discharge Information Items Discharge Condition: Stable Ambulatory Status: Ambulatory Discharge Destination: Home Transportation: Private Auto Accompanied By: son Schedule Follow-up Appointment: Yes Clinical Summary of Care: Electronic  Signature(s) Signed: 09/11/2019 4:31:46 PM By: Elliot Gurney, BSN, RN, CWS, Kim RN, BSN [name]: Elliot Gurney, BSN, RN, CWS, Kim on 09/11/2019 14:34:12 [name] [name] [name] [name] ([id number]) -------------------------------------------------------------------------------- Lower Extremity Assessment Details Patient Name: [name], [name] B. Date of Service: 09/11/2019 2:00 PM Medical Record Number: [id number] Patient Account Number: [id number] Date of Birth/Sex: Jan 06, 1976 (43 y.o. F) Treating RN: Rodell Perna Primary Care Sunil Hue: Marcelino Duster Other Clinician: Referring Jibreel Fedewa: Marcelino Duster Treating Geroldine Esquivias/Extender: Altamese [place] in Treatment: 4 Edema Assessment Assessed: [Left: No] [Right: No] Edema: [Left: N] [Right: o] Calf Left: Right: Point of Measurement: 32 cm From Medial Instep 42 cm cm Ankle Left: Right: Point of Measurement: 8 cm From Medial Instep 22.5 cm cm Vascular Assessment Pulses: Dorsalis Pedis Palpable: [Left:Yes] Electronic Signature(s) Signed: 09/11/2019 3:15:35 PM By: Rodell Perna [name]: Rodell Perna on 09/11/2019 14:12:23 [name], [name] B. ([id number]) -------------------------------------------------------------------------------- Multi Wound Chart Details Patient Name: [name] [name], [name] B. Date of Service: 09/11/2019 2:00 PM Medical Record Number: [id number] Patient Account Number: [id number] Date of Birth/Sex: Oct 21, 1976 (43 y.o. F) Treating RN: Huel Coventry Primary Care Earley Grobe: Marcelino Duster Other Clinician: Referring Jaquelyne Firkus: Marcelino Duster Treating Devyn Sheerin/Extender: Altamese [place] in Treatment: 4 Vital Signs Height(in): 60 Pulse(bpm): 67 Weight(lbs): 204 Blood Pressure(mmHg): 123/72 Body Mass Index(BMI): 40 Temperature(F): 98.0 Respiratory Rate 16 (breaths/min): Photos: [N/A:N/A] Wound Location: Left Lower Leg - Anterior N/A N/A Wounding Event: Blister N/A N/A Primary Etiology: Venous Leg Ulcer N/A N/A Date Acquired: 05/21/2018  N/A N/A Weeks of Treatment: 4 N/A N/A Wound Status: Open N/A N/A Measurements L x W x D 2x1.5x0.1 N/A N/A (cm) Area (cm) : 2.356 N/A N/A Volume (cm) : 0.236 N/A N/A % Reduction in Area: -130.80% N/A N/A % Reduction in Volume: -131.40% N/A N/A Classification: Partial Thickness N/A N/A Exudate Amount: Medium N/A N/A Exudate Type: Serosanguineous N/A N/A Exudate Color: red, brown N/A N/A Wound Margin: Flat and Intact N/A N/A Granulation Amount: Medium (34-66%) N/A N/A Granulation Quality: Red N/A N/A Necrotic Amount: Medium (34-66%) N/A N/A  Exposed Structures: Fat Layer (Subcutaneous N/A N/A Tissue) Exposed: Yes Fascia: No Tendon: No Muscle: No Joint: No Bone: No Epithelialization: Small (1-33%) N/A N/A [name], [name] B. ([id number]) Treatment Notes Wound #1 (Left, Anterior Lower Leg) Notes Hydrofera Blue, ABD, 3L(L), unna to anchor Electronic Signature(s) Signed: 09/11/2019 5:06:38 PM By: Linton Ham MD [name]: Linton Ham on 09/11/2019 15:01:23 [name], [name] [name] ([id number]) -------------------------------------------------------------------------------- [place] Details Patient Name: [name] [name], [name] B. Date of Service: 09/11/2019 2:00 PM Medical Record Number: [id number] Patient Account Number: [id number] Date of Birth/Sex: 23-Oct-1976 (43 y.o. F) Treating RN: Cornell Barman Primary Care Anny Sayler: Harrel Lemon Other Clinician: Referring Marqui Formby: Harrel Lemon Treating Ariyan Brisendine/Extender: Tito Dine in Treatment: 4 Active Inactive Necrotic Tissue Nursing Diagnoses: Impaired tissue integrity related to necrotic/devitalized tissue Goals: Necrotic/devitalized tissue will be minimized in the wound bed Date Initiated: 08/14/2019 Target Resolution Date: 08/21/2019 Goal Status: Active Interventions: Assess patient pain level pre-, during and post procedure and prior to discharge Treatment Activities: Apply topical anesthetic as  ordered : 08/14/2019 Notes: Orientation to the Wound Care Program Nursing Diagnoses: Knowledge deficit related to the wound healing center program Goals: Patient/caregiver will verbalize understanding of the [place] Date Initiated: 08/14/2019 Target Resolution Date: 08/21/2019 Goal Status: Active Interventions: Provide education on orientation to the wound center Notes: Wound/Skin Impairment Nursing Diagnoses: Impaired tissue integrity Goals: Ulcer/skin breakdown will have a volume reduction of 30% by week 4 Date Initiated: 08/14/2019 Target Resolution Date: 09/11/2019 Goal Status: Active Corkum, DANASHIA LANDERS ([id number]) Interventions: Assess ulceration(s) every visit Treatment Activities: Referred to DME Nekesha Font for dressing supplies : 08/14/2019 Notes: Electronic Signature(s) Signed: 09/11/2019 4:31:46 PM By: Gretta Cool, BSN, RN, CWS, Kim RN, BSN [name]: Gretta Cool, BSN, RN, CWS, Kim on 09/11/2019 14:31:16 Reinig, [name] [name] ([id number]) -------------------------------------------------------------------------------- Pain Assessment Details Patient Name: [name] [name], [name] B. Date of Service: 09/11/2019 2:00 PM Medical Record Number: [id number] Patient Account Number: [id number] Date of Birth/Sex: 12/14/1975 (43 y.o. F) Treating RN: Army Melia Primary Care Erionna Strum: Harrel Lemon Other Clinician: Referring Kelan Pritt: Harrel Lemon Treating Deanta Mincey/Extender: Tito Dine in Treatment: 4 Active Problems Location of Pain Severity and Description of Pain Patient Has Paino No Site Locations Pain Management and Medication Current Pain Management: Electronic Signature(s) Signed: 09/11/2019 3:15:35 PM By: Army Melia [name]: Army Melia on 09/11/2019 14:04:57 [name] [name], [name] [name] ([id number]) -------------------------------------------------------------------------------- Patient/Caregiver Education Details Patient Name: [name] [name], [name] B. Date of  Service: 09/11/2019 2:00 PM Medical Record Number: [id number] Patient Account Number: [id number] Date of Birth/Gender: 07/28/1976 (43 y.o. F) Treating RN: Cornell Barman Primary Care Physician: Harrel Lemon Other Clinician: Referring Physician: Harrel Lemon Treating Physician/Extender: Tito Dine in Treatment: 4 Education Assessment Education Provided To: Patient Education Topics Provided Venous: Handouts: Controlling Swelling with Multilayered Compression Wraps Methods: Demonstration, Explain/Verbal Responses: State content correctly Wound/Skin Impairment: Handouts: Caring for Your Ulcer Methods: Demonstration, Explain/Verbal Responses: State content correctly Electronic Signature(s) Signed: 09/11/2019 4:31:46 PM By: Gretta Cool, BSN, RN, CWS, Kim RN, BSN [name]: Gretta Cool, BSN, RN, CWS, Kim on 09/11/2019 14:34:55 [name] [name], [name] [name] ([id number]) -------------------------------------------------------------------------------- Wound Assessment Details Patient Name: [name] [name], [name] B. Date of Service: 09/11/2019 2:00 PM Medical Record Number: [id number] Patient Account Number: [id number] Date of Birth/Sex: 1976-03-11 (43 y.o. F) Treating RN: Army Melia Primary Care Samiah Ricklefs: Harrel Lemon Other Clinician: Referring Xyon Lukasik: Harrel Lemon Treating Tilmon Wisehart/Extender: Tito Dine in Treatment: 4 Wound Status Wound Number: 1 Primary Etiology: Venous Leg Ulcer Wound Location: Left Lower Leg - Anterior Wound Status: Open Wounding Event:  Blister Date Acquired: 05/21/2018 Weeks Of Treatment: 4 Clustered Wound: No Photos Wound Measurements Length: (cm) 2 Width: (cm) 1.5 Depth: (cm) 0.1 Area: (cm) 2.356 Volume: (cm) 0.236 % Reduction in Area: -130.8% % Reduction in Volume: -131.4% Epithelialization: Small (1-33%) Tunneling: No Undermining: No Wound Description Classification: Partial Thickness Foul Odor Af Wound Margin: Flat and Intact  Slough/Fibri Exudate Amount: Medium Exudate Type: Serosanguineous Exudate Color: red, brown ter Cleansing: No no Yes Wound Bed Granulation Amount: Medium (34-66%) Exposed Structure Granulation Quality: Red Fascia Exposed: No Necrotic Amount: Medium (34-66%) Fat Layer (Subcutaneous Tissue) Exposed: Yes Necrotic Quality: Adherent Slough Tendon Exposed: No Muscle Exposed: No Joint Exposed: No Bone Exposed: No Treatment Notes [name] [name], Kamariyah B. ([id number]) Wound #1 (Left, Anterior Lower Leg) Notes Hydrofera Blue, ABD, 3L(L), unna to anchor Electronic Signature(s) Signed: 09/11/2019 3:15:35 PM By: Rodell PernaScott, [name] [name] [name]: Rodell PernaScott, [name] [name] on 09/11/2019 14:11:58 [name] [name], [name] ScottGINGER B. ([id number]) -------------------------------------------------------------------------------- Vitals Details Patient Name: Grisanti, Blonnie B. Date of Service: 09/11/2019 2:00 PM Medical Record Number: [id number] Patient Account Number: [id number] Date of Birth/Sex: 12/25/1975 (43 y.o. F) Treating RN: Rodell PernaScott, [name] [name] Primary Care Guy Seese: Marcelino DusterJOHNSTON, JOHN Other Clinician: Referring Demetrias Goodbar: Marcelino DusterJOHNSTON, JOHN Treating Tyrel Lex/Extender: Altamese CarolinaOBSON, MICHAEL G Weeks in Treatment: 4 Vital Signs Time Taken: 14:04 Temperature (F): 98.0 Height (in): 60 Pulse (bpm): 67 Weight (lbs): 204 Respiratory Rate (breaths/min): 16 Body Mass Index (BMI): 39.8 Blood Pressure (mmHg): 123/72 Reference Range: 80 - 120 mg / dl Electronic Signature(s) Signed: 09/11/2019 3:15:35 PM By: Rodell PernaScott, [name] [name] [name]: Rodell PernaScott, [name] [name] on 09/11/2019 14:05:51

## 2019-09-12 NOTE — Progress Notes (Signed)
[name], Florentine B. ([id number]) Visit Report for 09/11/2019 HPI Details Patient Name: [name], [name] Date of Service: 09/11/2019 2:00 PM Medical Record Number: [id number] Patient Account Number: [id number] Date of Birth/Sex: 06/24/1976 (43 y.o. F) Treating RN: Huel CoventryWoody, Kim Primary Care Provider: Marcelino DusterJOHNSTON, JOHN Other Clinician: Referring Provider: Marcelino DusterJOHNSTON, JOHN Treating Provider/Extender: Altamese CarolinaOBSON, MICHAEL G Weeks in Treatment: 4 History of Present Illness HPI Description: ADMISSION 08/14/2019 This is a 43 year old woman who is here for review of a longstanding wound on the left anterior tibial area for perhaps as long as 1 year. 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 She recently had an IVC stent placed. She relates this wound to a complicated admission in June 2019. She was admitted to hospital for a planned hysterectomy she developed acute tubular necrosis postop. . A CT angiogram showed an IVC filter occlusion. [facility] I did not look out open over these records. Apparently her kidney function recovered. 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 She has not been doing anything to this wound except for episodic Neosporin. She has not been wearing compression. 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 She takes chronic Xarelto. As I understand things she is had a recent IVC stent Past medical history multiple DVTs as noted above on chronic anticoagulation with Xarelto, postoperative acute tubular necrosis I think has completely resolved per her description although I have not verified this. ABIs in our clinic were noncompressible on the left 9/30; wound surface looks better. No mechanical debridement. We have been using Iodoflex under 3 layer compression. 10/7; wound surface had some eschar on the lower part of the circumference and some  debris on the surface. I have been using Iodoflex. [facility] I was able to find arterial studies although they did not look at anything below the knee. She does have a femoral artery stent which was felt at that time to be widely patent. The also looked for a DVT at that time the common femoral femoral veins were fully compressible 10/14; better looking wound surface that is measuring slightly smaller. Using Hydrofera Blue. They corrected me today on the femoral artery stent that I stated on 10/7 and said it was a vein stent tell need to have a better look at her [facility] records. The wound is smaller and looks healthy 10/21; scant amount of denuded skin removed with pickups and scissors. Using Hydrofera Blue. Very healthy looking wound surface Electronic Signature(s) Signed: 09/11/2019 5:06:38 PM By: Baltazar Najjarobson, Michael MD [name]: Baltazar Najjarobson, Michael on 09/11/2019 15:02:04 Bleecker, [name] ([id number]) -------------------------------------------------------------------------------- Physical Exam Details Patient Name: Duarte, [name] Date of Service: 09/11/2019 2:00 PM Medical Record Number: [id number] Patient Account Number: [id number] Date of Birth/Sex: 05/21/1976 (43 y.o. F) Treating RN: Huel CoventryWoody, Kim Primary Care Provider: Marcelino DusterJOHNSTON, JOHN Other Clinician: Referring Provider: Marcelino DusterJOHNSTON, JOHN Treating Provider/Extender: Altamese CarolinaOBSON, MICHAEL G Weeks in Treatment: 4 Constitutional Sitting or standing Blood Pressure is within target range for patient.. Pulse regular and within target range for patient.[name]. Respirations regular, non-labored and within target range.. Temperature is normal and within the target range for the patient.[name]. appears in no distress. Eyes Conjunctivae clear. No discharge. Respiratory Respiratory effort is easy and symmetric bilaterally. Rate is normal at rest and on room air.. Cardiovascular Pedal pulses are  palpable. Integumentary (  Hair, Skin) Surrounding hemosiderin deposition around the wound but no evidence of infection or inflammation. Psychiatric No evidence of depression, anxiety, or agitation. Calm, cooperative, and communicative. Appropriate interactions and affect.. Notes Wound exam; small wound in the left mid tibia. Area may be about the same. No debridement is required. There are islands of epithelialization internally. No major edema no evidence of infection Electronic Signature(s) Signed: 09/11/2019 5:06:38 PM By: Baltazar Najjar MD [name]: Baltazar Najjar on 09/11/2019 15:03:19 Castro, [name] ([id number]) -------------------------------------------------------------------------------- Physician Orders Details Patient Name: Coyt, [name] Date of Service: 09/11/2019 2:00 PM Medical Record Number: [id number] Patient Account Number: [id number] Date of Birth/Sex: 1976/08/16 (43 y.o. F) Treating RN: Huel Coventry Primary Care Provider: Marcelino Duster Other Clinician: Referring Provider: Marcelino Duster Treating Provider/Extender: Altamese [place] in Treatment: 4 Verbal / Phone Orders: No Diagnosis Coding Wound Cleansing Wound #1 Left,Anterior Lower Leg o Clean wound with Normal Saline. o May shower with protection. - Cast protector to keep wrap dry Anesthetic (add to Medication List) Wound #1 Left,Anterior Lower Leg o Topical Lidocaine 4% cream applied to wound bed prior to debridement (In Clinic Only). Primary Wound Dressing Wound #1 Left,Anterior Lower Leg o Hydrafera Blue Ready Transfer Secondary Dressing Wound #1 Left,Anterior Lower Leg o ABD pad Dressing Change Frequency Wound #1 Left,Anterior Lower Leg o Change dressing every week Follow-up Appointments Wound #1 Left,Anterior Lower Leg o Return Appointment in 1 week. o Nurse Visit as needed Edema Control Wound #1 Left,Anterior Lower Leg o 3 Layer Compression System - Left Lower  Extremity - Unna to anchor Electronic Signature(s) Signed: 09/11/2019 4:31:46 PM By: Elliot Gurney, BSN, RN, CWS, Kim RN, BSN Signed: 09/11/2019 5:06:38 PM By: Baltazar Najjar MD [name]: Elliot Gurney, BSN, RN, CWS, Kim on 09/11/2019 14:32:44 Henslee, [name] ([id number]) -------------------------------------------------------------------------------- Problem List Details Patient Name: Mood, Vania B. Date of Service: 09/11/2019 2:00 PM Medical Record Number: [id number] Patient Account Number: [id number] Date of Birth/Sex: 1976-08-19 (43 y.o. 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 Date of Service: 09/11/2019 2:00 PM Medical Record Number: [id number] Patient Account Number: [id number] Date of Birth/Sex: Jan 02, 1976 (43 y.o. F) Treating RN: Huel Coventry Primary Care Provider: Marcelino Duster Other Clinician: Referring Provider: Marcelino Duster Treating Provider/Extender: Altamese [place] in Treatment: 4 Subjective History of Present Illness (HPI) ADMISSION 08/14/2019 This is a 43 year old woman who is here for review of a longstanding wound on the left anterior tibial area for  perhaps as long as 1 year. 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 She recently had an IVC stent placed. She relates this wound to a complicated admission in June 2019. She was admitted to hospital for a planned hysterectomy she developed acute tubular necrosis postop. . A CT angiogram showed an IVC filter occlusion. She was transferred to  [facility]. I did not look out open over these records. Apparently her kidney function recovered. 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 She has not been doing anything to this wound except for episodic Neosporin. She has not been wearing compression. 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 As I understand things she is had a recent IVC stent Past medical history multiple DVTs as noted above on chronic anticoagulation with Xarelto, postoperative acute tubular necrosis I think has completely resolved per her description although I have not verified this. ABIs in our clinic were noncompressible on the left 9/30; wound surface looks better. No mechanical debridement. We have been using Iodoflex under 3 layer compression. 10/7; wound surface had some eschar on the lower part of the circumference and some debris on the surface. I have been using Iodoflex. Changed to [facility] today Looking through her records at [facility] I was able to find arterial studies although they did not look at anything below the knee. She does have a femoral artery stent which was felt at that time to be widely patent. The also looked for a DVT at that time the common femoral femoral veins were fully compressible 10/14; better looking wound surface that is measuring slightly smaller. Using Hydrofera Blue. They corrected me today on the femoral artery stent that I stated on 10/7 and said it was a vein stent tell need to have a  better look at her [facility] records. The wound is smaller and looks healthy 10/21; scant amount of denuded skin removed with pickups and scissors. Using Hydrofera Blue. Very healthy looking wound surface Objective Constitutional Sitting or standing Blood Pressure is within target range for patient.. Pulse regular and within target range for patient.[name] Respirations regular, non-labored and within target range.. Temperature is normal and within the target range for the patient.[name] appears in no distress. Vitals Time Taken: 2:04 PM, Height: 60 in, Weight: 204 lbs, BMI: 39.8, Temperature: 98.0 F, Pulse: 67 bpm, Respiratory Fittro, Armoni B. ([id number]) Rate: 16 breaths/min, Blood Pressure: 123/72 mmHg. Eyes Conjunctivae clear. No discharge. Respiratory Respiratory effort is easy and symmetric bilaterally. Rate is normal at rest and on room air.. Cardiovascular Pedal pulses are palpable. Psychiatric No evidence of depression, anxiety, or agitation. Calm, cooperative, and communicative. Appropriate interactions and affect.. General Notes: Wound exam; small wound in the left mid tibia. Area may be about the same. No debridement is required. There are islands of epithelialization internally. No major edema no evidence of infection Integumentary (Hair, Skin) Surrounding hemosiderin deposition around the wound but no evidence of infection or inflammation. Wound #1 status is Open. Original cause of wound was Blister. The wound is located on the Left,Anterior Lower Leg. The wound measures 2cm length x 1.5cm width x 0.1cm depth; 2.356cm^2 area and 0.236cm^3 volume. There is Fat Layer (Subcutaneous Tissue) Exposed exposed. There is no tunneling or undermining noted. There is a medium amount of serosanguineous drainage noted. The wound margin is flat and intact. There is medium (34-66%) red granulation within the wound bed. There is a medium (34-66%) amount of necrotic tissue within the wound bed including  Adherent Slough. 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 May shower with protection. - Cast protector to keep wrap  dry Anesthetic (add to Medication List): Wound #1 Left,Anterior Lower Leg: Topical Lidocaine 4% cream applied to wound bed prior to debridement (In Clinic Only). Primary Wound Dressing: Wound #1 Left,Anterior Lower Leg: Hydrafera Blue Ready Transfer Secondary Dressing: ALYCEA, SEGOVIANO. ([id number]) Wound #1 Left,Anterior Lower Leg: ABD pad Dressing Change Frequency: Wound #1 Left,Anterior Lower Leg: Change dressing every week Follow-up Appointments: Wound #1 Left,Anterior Lower Leg: Return Appointment in 1 week. Nurse Visit as needed Edema Control: Wound #1 Left,Anterior Lower Leg: 3 Layer Compression System - Left Lower Extremity - Unna to anchor 1. Continue with Hydrofera Blue under 3 layer compression 2. Careful attention to measurements next week. Should be smaller Electronic Signature(s) Signed: 09/11/2019 5:06:38 PM By: Linton Ham MD [name]: Linton Ham on 09/11/2019 15:04:37 Hendrix, [name] ([id number]) -------------------------------------------------------------------------------- SuperBill Details Patient Name: Brunelli, Persais B. Date of Service: 09/11/2019 Medical Record Number: [id number] Patient Account Number: [id number] Date of Birth/Sex: 23-Oct-1976 (43 y.o. 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

## 2019-09-18 ENCOUNTER — Other Ambulatory Visit: Payer: Self-pay

## 2019-09-18 ENCOUNTER — Encounter: Payer: BC Managed Care – PPO | Admitting: Internal Medicine

## 2019-09-18 DIAGNOSIS — I87312 Chronic venous hypertension (idiopathic) with ulcer of left lower extremity: Secondary | ICD-10-CM | POA: Diagnosis not present

## 2019-09-19 NOTE — Progress Notes (Signed)
ONETTA, SPAINHOWER ([id number]) Visit Report for 09/18/2019 HPI Details Patient Name: [name], [name] Date of Service: 09/18/2019 2:00 PM Medical Record Number: [id number] Patient Account Number: [id number] Date of Birth/Sex: [birth year] (43 y.o. F) Treating RN: Huel Coventry Primary Care Provider: Marcelino Duster Other Clinician: Referring Provider: Marcelino Duster Treating Provider/Extender: Altamese Kent in Treatment: 5 History of Present Illness HPI Description: ADMISSION 08/14/2019 This is a 43 year old woman who is here for review of a longstanding wound on the left anterior tibial area for perhaps as long as 1 year. 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 She recently had an IVC stent placed. She relates this wound to a complicated admission in June 2019. She was admitted to hospital for a planned hysterectomy she developed acute tubular necrosis postop. . A CT angiogram showed an IVC filter occlusion. She was transferred to [facility]. I did not look out open over these records. Apparently her kidney function recovered. 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 She has not been doing anything to this wound except for episodic Neosporin. She has not been wearing compression. 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 As I understand things she is had a recent IVC stent Past medical history multiple DVTs as noted above on chronic anticoagulation with Xarelto, postoperative acute tubular necrosis I think has completely resolved per her description although I have not verified this. ABIs in our clinic were noncompressible on the left 9/30; wound surface looks better. No mechanical debridement. We have been using Iodoflex under 3 layer compression. 10/7; wound surface had some eschar on the lower part of the circumference and some  debris on the surface. I have been using Iodoflex. Changed to [facility] today Looking through her records at [facility] I was able to find arterial studies although they did not look at anything below the knee. She does have a femoral artery stent which was felt at that time to be widely patent. The also looked for a DVT at that time the common femoral femoral veins were fully compressible 10/14; better looking wound surface that is measuring slightly smaller. Using Hydrofera Blue. They corrected me today on the femoral artery stent that I stated on 10/7 and said it was a vein stent tell need to have a better look at her [facility] records. The wound is smaller and looks healthy 10/21; scant amount of denuded skin removed with pickups and scissors. Using Hydrofera Blue. Very healthy looking wound surface 10/28; left lower extremity anterior tibial wound. 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 Electronic Signature(s) Signed: 09/18/2019 5:54:02 PM By: Baltazar Najjar MD [name]: Baltazar Najjar on 09/18/2019 16:04:23 Alridge, [name] ([id number]) -------------------------------------------------------------------------------- Physical Exam Details Patient Name: [name], [name] Date of Service: 09/18/2019 2:00 PM Medical Record Number: [id number] Patient Account Number: [id number] Date of Birth/Sex: [birth year] (43 y.o. F) Treating RN: Huel Coventry Primary Care Provider: Marcelino Duster Other Clinician: Referring Provider: Marcelino Duster Treating Provider/Extender: Altamese  in Treatment: 5 Constitutional Sitting or standing Blood Pressure is within target range for patient.. Pulse regular and within target range for patient.[name] Respirations regular, non-labored and within target range.. Temperature is normal and within the target range for the patient.[name] appears in no  distress. Eyes Conjunctivae clear. No discharge. Respiratory Respiratory effort is easy and symmetric bilaterally. Rate is normal at rest and on room air.. Cardiovascular Pedal pulses are palpable.. She has chronic venous insufficiency with secondary lymphedema. The lymphedema is moderate stage II. Integumentary (Hair, Skin) No surrounding infection however changes of chronic venous insufficiency with hemosiderin deposition. Psychiatric No evidence of depression, anxiety, or agitation. Calm, cooperative, and communicative. Appropriate interactions and affect.. Notes Wound exam; small wound looking very healthy in the left mid tibia. No surrounding erythema. Surface area measurements are better Electronic Signature(s) Signed: 09/18/2019 5:54:02 PM By: Linton Ham MD [name]: Linton Ham on 09/18/2019 16:05:46 Castrillo, [name] ([id number]) -------------------------------------------------------------------------------- Physician Orders Details Patient Name: [name], [name] Date of Service: 09/18/2019 2:00 PM Medical Record Number: [id number] Patient Account Number: [id number] Date of Birth/Sex: [birth year] (43 y.o. F) Treating RN: Cornell Barman Primary Care Provider: Harrel Lemon Other Clinician: Referring Provider: Harrel Lemon Treating Provider/Extender: Tito Dine in Treatment: 5 Verbal / Phone Orders: No Diagnosis Coding Wound Cleansing Wound #1 Left,Anterior Lower Leg o Clean wound with Normal Saline. o May shower with protection. - Cast protector to keep wrap dry Anesthetic (add to Medication List) Wound #1 Left,Anterior Lower Leg o Topical Lidocaine 4% cream applied to wound bed prior to debridement (In Clinic Only). Primary Wound Dressing Wound #1 Left,Anterior Lower Leg o Hydrafera Blue Ready Transfer Secondary Dressing Wound #1 Left,Anterior Lower Leg o ABD pad Dressing Change Frequency Wound #1 Left,Anterior Lower Leg o Change  dressing every week Follow-up Appointments Wound #1 Left,Anterior Lower Leg o Return Appointment in 1 week. o Nurse Visit as needed Edema Control Wound #1 Left,Anterior Lower Leg o 3 Layer Compression System - Left Lower Extremity - Unna to anchor Electronic Signature(s) Signed: 09/18/2019 5:54:02 PM By: Linton Ham MD Signed: 09/19/2019 10:04:30 AM By: Gretta Cool, BSN, RN, CWS, Kim RN, BSN [name]: Gretta Cool, BSN, RN, CWS, Kim on 09/18/2019 15:24:55 Molino, [name] ([id number]) -------------------------------------------------------------------------------- Problem List Details Patient Name: Bells, Alva B. Date of Service: 09/18/2019 2:00 PM Medical Record Number: [id number] Patient Account Number: [id number] Date of Birth/Sex: [birth year] (43 y.o. 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 Date of Service: 09/18/2019 2:00 PM Medical Record Number: [id number] Patient Account Number: [id number] Date of Birth/Sex: 06-Aug-1976 (43 y.o. F) Treating RN: Cornell Barman Primary Care Provider: Harrel Lemon Other Clinician: Referring Provider: Harrel Lemon Treating  Provider/Extender: Tito Dine in Treatment: 5 Subjective History of Present Illness (HPI) ADMISSION 08/14/2019 This is a 43 year old woman who is here for review of a longstanding wound on the left anterior tibial area for perhaps as long as 1 year. 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 She recently had an IVC stent placed. She relates this wound to a complicated admission in  June 2019. She was admitted to hospital for a planned hysterectomy she developed acute tubular necrosis postop. . A CT angiogram showed an IVC filter occlusion. [facility] I did not look out open over these records. Apparently her kidney function recovered. 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 She has not been doing anything to this wound except for episodic Neosporin. She has not been wearing compression. 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 She takes chronic Xarelto. As I understand things she is had a recent IVC stent Past medical history multiple DVTs as noted above on chronic anticoagulation with Xarelto, postoperative acute tubular necrosis I think has completely resolved per her description although I have not verified this. ABIs in our clinic were noncompressible on the left 9/30; wound surface looks better. No mechanical debridement. We have been using Iodoflex under 3 layer compression. 10/7; wound surface had some eschar on the lower part of the circumference and some debris on the surface. I have been using Iodoflex. [facility] I was able to find arterial studies although they did not look at anything below the knee. She does have a femoral artery stent which was felt at that time to be widely patent. The also looked for a DVT at that time the common femoral femoral veins were  fully compressible 10/14; better looking wound surface that is measuring slightly smaller. Using Hydrofera Blue. They corrected me today on the femoral artery stent that I stated on 10/7 and said it was a vein stent tell need to have a better look at her [facility] records. The wound is smaller and looks healthy 10/21; scant amount of denuded skin removed with pickups and scissors. Using Hydrofera Blue. Very healthy looking wound surface 10/28; left lower extremity anterior tibial wound. 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 Objective Constitutional Sitting or standing Blood Pressure is within target range for patient.. Pulse regular and within target range for patient.[name]. Respirations regular, non-labored and within target range.. Temperature is normal and within the target range for the patient.[name]. appears in no distress. Schaben, [name] ([id number]) Vitals Time Taken: 2:05 PM, Height: 60 in, Weight: 204 lbs, BMI: 39.8, Temperature: 98.3 F, Pulse: 71 bpm, Respiratory Rate: 16 breaths/min, Blood Pressure: 115/69 mmHg. Eyes Conjunctivae clear. No discharge. Respiratory Respiratory effort is easy and symmetric bilaterally. Rate is normal at rest and on room air.. Cardiovascular Pedal pulses are palpable.. She has chronic venous insufficiency with secondary lymphedema. The lymphedema is moderate stage II. Psychiatric No evidence of depression, anxiety, or agitation. Calm, cooperative, and communicative. Appropriate interactions and affect.. General Notes: Wound exam; small wound looking very healthy in the left mid tibia. No surrounding erythema. Surface area measurements are better Integumentary (Hair, Skin) No surrounding infection however changes of chronic venous insufficiency with hemosiderin deposition. Wound #1 status is Open. Original cause of wound was Blister. The  wound is located on the Left,Anterior Lower Leg. The wound measures 1cm length x 0.7cm width x 0.1cm depth; 0.55cm^2 area and 0.055cm^3 volume. There is Fat Layer (Subcutaneous Tissue) Exposed exposed. There is a medium amount of serosanguineous drainage noted. The wound margin is flat and intact. There is large (67-100%) red granulation within the wound bed. There is a small (1-33%) amount of necrotic tissue within the wound bed including Adherent Slough. 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 May shower with protection. - Cast protector to keep wrap dry Anesthetic (add to Medication List): Wound #1 Left,Anterior Lower Leg: Topical Lidocaine 4% cream applied to wound bed prior to debridement (In Clinic Only). Primary Wound Dressing: Terhaar, Lorell B. ([id number]) Wound #1 Left,Anterior Lower Leg: Hydrafera Blue Ready Transfer Secondary Dressing: Wound #1 Left,Anterior Lower Leg: ABD pad Dressing Change Frequency: Wound #1 Left,Anterior Lower Leg: Change dressing every week Follow-up Appointments: Wound #1 Left,Anterior Lower Leg: Return Appointment in 1 week. Nurse Visit as needed Edema Control: Wound #1 Left,Anterior Lower Leg: 3 Layer Compression System - Left Lower Extremity - Unna to anchor 1. Continue with the same dressing which is Hydrofera Blue. Patient question whether she needed to be wrapped she definitely does we explained this to her. 2. We gave her measurements for her stockings in the eventuality that this may be closed in the next 2 to 3 weeks Electronic Signature(s) Signed: 09/18/2019 5:54:02 PM By: Baltazar Najjar MD [name]: Baltazar Najjar on 09/18/2019 16:07:01 Gable, [name]  ([id number]) -------------------------------------------------------------------------------- SuperBill Details Patient Name: Falcon, Girlie B. Date of Service: 09/18/2019 Medical Record Number: [id number] Patient Account Number: [id number] Date of Birth/Sex: 1976/08/14 (43 y.o. 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 Facility Procedures CPT4 Code: [id number] Description: (Facility Use Only) [phone] - APPLY MULTLAY COMPRS LWR LT LEG Modifier: Quantity: 1 Physician Procedures CPT4 Code Description: [id number] - WC PHYS LEVEL 3 - EST PT ICD-10 Diagnosis Description L97.821 Non-pressure chronic ulcer of other part of left lower leg limi I87.312 Chronic venous hypertension (idiopathic) with ulcer of left low D68.69 Other  thrombophilia Modifier: ted to breakdown er extremity Quantity: 1 of skin Electronic Signature(s) Signed: 09/18/2019 5:54:02 PM By: Baltazar Najjar MD [name]: Baltazar Najjar on 09/18/2019 16:07:24

## 2019-09-19 NOTE — Progress Notes (Signed)
[name], Shailene B. ([id number]) Visit Report for 09/18/2019 Arrival Information Details Patient Name: Henckel, Dinah BeersGINGER B. Date of Service: 09/18/2019 2:00 PM Medical Record Number: [id number] Patient Account Number: [id number] Date of Birth/Sex: 02/02/1976 (43 y.o. F) Treating RN: Huel CoventryWoody, Kim Primary Care Lucill Mauck: Marcelino DusterJOHNSTON, JOHN Other Clinician: Referring Daiquan Resnik: Marcelino DusterJOHNSTON, JOHN Treating Norrin Shreffler/Extender: Altamese CarolinaOBSON, MICHAEL G Weeks in Treatment: 5 Visit Information History Since Last Visit Added or deleted any medications: No Patient Arrived: Ambulatory Any new allergies or adverse reactions: No Arrival Time: 14:06 Had a fall or experienced change in No Accompanied By: son activities of daily living that may affect Transfer Assistance: None risk of falls: Patient Identification Verified: Yes Signs or symptoms of abuse/neglect since last visito No Secondary Verification Process Yes Hospitalized since last visit: No Completed: Implantable device outside of the clinic excluding No Patient Has Alerts: Yes cellular tissue based products placed in the center Patient Alerts: Patient on Blood since last visit: Thinner Has Dressing in Place as Prescribed: Yes Xarelto Has Compression in Place as Prescribed: Yes Pain Present Now: No Electronic Signature(s) Signed: 09/18/2019 4:16:31 PM By: Dayton MartesWallace, RCP,RRT,CHT, Sallie RCP, RRT, CHT [name]: Dayton MartesWallace, RCP,RRT,CHT, Sallie on 09/18/2019 14:07:35 Yordy, [name] ([id number]) -------------------------------------------------------------------------------- Encounter Discharge Information Details Patient Name: Gosselin, Lasonja B. Date of Service: 09/18/2019 2:00 PM Medical Record Number: [id number] Patient Account Number: [id number] Date of Birth/Sex: 02/20/1976 (43 y.o. F) Treating RN: Huel CoventryWoody, Kim Primary Care Clessie Karras: Marcelino DusterJOHNSTON, JOHN Other Clinician: Referring Aily Tzeng: Marcelino DusterJOHNSTON, JOHN Treating Teniyah Seivert/Extender: Altamese CarolinaOBSON, MICHAEL G Weeks in  Treatment: 5 Encounter Discharge Information Items Discharge Condition: Stable Ambulatory Status: Ambulatory Discharge Destination: Home Transportation: Private Auto Accompanied By: self Schedule Follow-up Appointment: Yes Clinical Summary of Care: Electronic Signature(s) Signed: 09/19/2019 10:04:30 AM By: Elliot GurneyWoody, BSN, RN, CWS, Kim RN, BSN [name]: Elliot GurneyWoody, BSN, RN, CWS, Kim on 09/18/2019 15:28:07 Kana, [name] ([id number]) -------------------------------------------------------------------------------- Lower Extremity Assessment Details Patient Name: Fehr, Dyann B. Date of Service: 09/18/2019 2:00 PM Medical Record Number: [id number] Patient Account Number: [id number] Date of Birth/Sex: 10/18/1976 (43 y.o. F) Treating RN: Arnette NorrisBiell, Kristina Primary Care Donalee Gaumond: Marcelino DusterJOHNSTON, JOHN Other Clinician: Referring Teonia Yager: Marcelino DusterJOHNSTON, JOHN Treating Datrell Dunton/Extender: Altamese CarolinaOBSON, MICHAEL G Weeks in Treatment: 5 Edema Assessment Assessed: [Left: No] [Right: No] Edema: [Left: N] [Right: o] Calf Left: Right: Point of Measurement: 32 cm From Medial Instep 41 cm cm Ankle Left: Right: Point of Measurement: 8 cm From Medial Instep 21 cm cm Vascular Assessment Pulses: Dorsalis Pedis Palpable: [Left:Yes] Electronic Signature(s) Signed: 09/18/2019 4:41:01 PM By: Arnette NorrisBiell, Kristina [name]: Arnette NorrisBiell, Kristina on 09/18/2019 14:20:15 Sandt, Delesa B. ([id number]) -------------------------------------------------------------------------------- Multi Wound Chart Details Patient Name: Eick, Aubriee B. Date of Service: 09/18/2019 2:00 PM Medical Record Number: [id number] Patient Account Number: [id number] Date of Birth/Sex: 11/06/1976 (43 y.o. F) Treating RN: Huel CoventryWoody, Kim Primary Care Guy Seese: Marcelino DusterJOHNSTON, JOHN Other Clinician: Referring Tobiah Celestine: Marcelino DusterJOHNSTON, JOHN Treating Lealand Elting/Extender: Altamese CarolinaOBSON, MICHAEL G Weeks in Treatment: 5 Vital Signs Height(in): 60 Pulse(bpm): 71 Weight(lbs): 204 Blood  Pressure(mmHg): 115/69 Body Mass Index(BMI): 40 Temperature(F): 98.3 Respiratory Rate 16 (breaths/min): Photos: [N/A:N/A] Wound Location: Left Lower Leg - Anterior N/A N/A Wounding Event: Blister N/A N/A Primary Etiology: Venous Leg Ulcer N/A N/A Date Acquired: 05/21/2018 N/A N/A Weeks of Treatment: 5 N/A N/A Wound Status: Open N/A N/A Measurements L x W x D 1x0.7x0.1 N/A N/A (cm) Area (cm) : 0.55 N/A N/A Volume (cm) : 0.055 N/A N/A % Reduction in Area: 46.10% N/A N/A % Reduction in Volume: 46.10% N/A N/A Classification: Partial Thickness N/A N/A Exudate Amount: Medium N/A  N/A Exudate Type: Serosanguineous N/A N/A Exudate Color: red, brown N/A N/A Wound Margin: Flat and Intact N/A N/A Granulation Amount: Large (67-100%) N/A N/A Granulation Quality: Red N/A N/A Necrotic Amount: Small (1-33%) N/A N/A Exposed Structures: Fat Layer (Subcutaneous N/A N/A Tissue) Exposed: Yes Fascia: No Tendon: No Muscle: No Joint: No Bone: No Epithelialization: Small (1-33%) N/A N/A Stumpe, Jana B. ([id number]) Treatment Notes Wound #1 (Left, Anterior Lower Leg) Notes Hydrofera Blue, ABD, 3L(L), unna to anchor Electronic Signature(s) Signed: 09/18/2019 5:54:02 PM By: Linton Ham MD [name]: Linton Ham on 09/18/2019 16:03:06 Penrose, [name] ([id number]) -------------------------------------------------------------------------------- [place] Details Patient Name: Zimmerle, Aryonna B. Date of Service: 09/18/2019 2:00 PM Medical Record Number: [id number] Patient Account Number: [id number] Date of Birth/Sex: [birth year] (43 y.o. F) Treating RN: Cornell Barman Primary Care Ashante Yellin: Harrel Lemon Other Clinician: Referring Waqas Bruhl: Harrel Lemon Treating Brent Taillon/Extender: Tito Dine in Treatment: 5 Active Inactive Necrotic Tissue Nursing Diagnoses: Impaired tissue integrity related to necrotic/devitalized tissue Goals: Necrotic/devitalized  tissue will be minimized in the wound bed Date Initiated: 08/14/2019 Target Resolution Date: 08/21/2019 Goal Status: Active Interventions: Assess patient pain level pre-, during and post procedure and prior to discharge Treatment Activities: Apply topical anesthetic as ordered : 08/14/2019 Notes: Orientation to the Wound Care Program Nursing Diagnoses: Knowledge deficit related to the wound healing center program Goals: Patient/caregiver will verbalize understanding of the [place] Date Initiated: 08/14/2019 Target Resolution Date: 08/21/2019 Goal Status: Active Interventions: Provide education on orientation to the wound center Notes: Wound/Skin Impairment Nursing Diagnoses: Impaired tissue integrity Goals: Ulcer/skin breakdown will have a volume reduction of 30% by week 4 Date Initiated: 08/14/2019 Target Resolution Date: 09/11/2019 Goal Status: Active Slayton, LINZEE DEPAUL ([id number]) Interventions: Assess ulceration(s) every visit Treatment Activities: Referred to DME Michaeljoseph Revolorio for dressing supplies : 08/14/2019 Notes: Electronic Signature(s) Signed: 09/19/2019 10:04:30 AM By: Gretta Cool, BSN, RN, CWS, Kim RN, BSN [name]: Gretta Cool, BSN, RN, CWS, Kim on 09/18/2019 15:24:22 Lozano, [name] ([id number]) -------------------------------------------------------------------------------- Pain Assessment Details Patient Name: Ayotte, Chinmayi B. Date of Service: 09/18/2019 2:00 PM Medical Record Number: [id number] Patient Account Number: [id number] Date of Birth/Sex: [birth year] (43 y.o. F) Treating RN: Cornell Barman Primary Care Sylina Henion: Harrel Lemon Other Clinician: Referring Jeri Rawlins: Harrel Lemon Treating Malcolm Hetz/Extender: Tito Dine in Treatment: 5 Active Problems Location of Pain Severity and Description of Pain Patient Has Paino No Site Locations Pain Management and Medication Current Pain Management: Electronic Signature(s) Signed: 09/18/2019  4:16:31 PM By: Paulla Fore, RRT, CHT Signed: 09/19/2019 10:04:30 AM By: Gretta Cool, BSN, RN, CWS, Kim RN, BSN [name]: Lorine Bears on 09/18/2019 14:08:15 Gittings, [name] ([id number]) -------------------------------------------------------------------------------- Patient/Caregiver Education Details Patient Name: Rochette, Galen Daft B. Date of Service: 09/18/2019 2:00 PM Medical Record Number: [id number] Patient Account Number: [id number] Date of Birth/Gender: 1976-06-09 (43 y.o. F) Treating RN: Cornell Barman Primary Care Physician: Harrel Lemon Other Clinician: Referring Physician: Harrel Lemon Treating Physician/Extender: Tito Dine in Treatment: 5 Education Assessment Education Provided To: Patient Education Topics Provided Venous: Handouts: Controlling Swelling with Multilayered Compression Wraps Methods: Demonstration, Explain/Verbal Responses: State content correctly Electronic Signature(s) Signed: 09/19/2019 10:04:30 AM By: Gretta Cool, BSN, RN, CWS, Kim RN, BSN [name]: Gretta Cool, BSN, RN, CWS, Kim on 09/18/2019 15:27:22 Bow, [name] ([id number]) -------------------------------------------------------------------------------- Wound Assessment Details Patient Name: Foxworth, Helene B. Date of Service: 09/18/2019 2:00 PM Medical Record Number: [id number] Patient Account Number: [id number] Date of Birth/Sex: 01-25-1976 (43 y.o. F) Treating RN: Harold Barban Primary Care Annamae Shivley:  Marcelino Duster Other Clinician: Referring Daesean Lazarz: Marcelino Duster Treating Keandrea Tapley/Extender: Altamese [place] in Treatment: 5 Wound Status Wound Number: 1 Primary Etiology: Venous Leg Ulcer Wound Location: Left Lower Leg - Anterior Wound Status: Open Wounding Event: Blister Date Acquired: 05/21/2018 Weeks Of Treatment: 5 Clustered Wound: No Photos Wound Measurements Length: (cm) 1 Width: (cm) 0.7 Depth: (cm) 0.1 Area: (cm) 0.55 Volume:  (cm) 0.055 % Reduction in Area: 46.1% % Reduction in Volume: 46.1% Epithelialization: Small (1-33%) Wound Description Classification: Partial Thickness Foul Odor A Wound Margin: Flat and Intact Slough/Fibr Exudate Amount: Medium Exudate Type: Serosanguineous Exudate Color: red, brown fter Cleansing: No ino Yes Wound Bed Granulation Amount: Large (67-100%) Exposed Structure Granulation Quality: Red Fascia Exposed: No Necrotic Amount: Small (1-33%) Fat Layer (Subcutaneous Tissue) Exposed: Yes Necrotic Quality: Adherent Slough Tendon Exposed: No Muscle Exposed: No Joint Exposed: No Bone Exposed: No Treatment Notes Nelms, Zarriah B. ([id number]) Wound #1 (Left, Anterior Lower Leg) Notes Hydrofera Blue, ABD, 3L(L), unna to anchor Electronic Signature(s) Signed: 09/18/2019 4:41:01 PM By: Arnette Norris [name]: Arnette Norris on 09/18/2019 14:21:37 Difranco, [name] ([id number]) -------------------------------------------------------------------------------- Vitals Details Patient Name: Lang, Elenor B. Date of Service: 09/18/2019 2:00 PM Medical Record Number: [id number] Patient Account Number: [id number] Date of Birth/Sex: July 11, 1976 (43 y.o. F) Treating RN: Huel Coventry Primary Care Kaiah Hosea: Marcelino Duster Other Clinician: Referring Margurete Guaman: Marcelino Duster Treating Addy Mcmannis/Extender: Altamese [place] in Treatment: 5 Vital Signs Time Taken: 14:05 Temperature (F): 98.3 Height (in): 60 Pulse (bpm): 71 Weight (lbs): 204 Respiratory Rate (breaths/min): 16 Body Mass Index (BMI): 39.8 Blood Pressure (mmHg): 115/69 Reference Range: 80 - 120 mg / dl Electronic Signature(s) Signed: 09/18/2019 4:16:31 PM By: Dayton Martes RCP, RRT, CHT [name]: Dayton Martes on 09/18/2019 14:08:41

## 2019-09-25 ENCOUNTER — Encounter: Payer: BC Managed Care – PPO | Attending: Internal Medicine | Admitting: Internal Medicine

## 2019-09-25 ENCOUNTER — Other Ambulatory Visit: Payer: Self-pay

## 2019-09-25 DIAGNOSIS — D6869 Other thrombophilia: Secondary | ICD-10-CM | POA: Diagnosis not present

## 2019-09-25 DIAGNOSIS — L97822 Non-pressure chronic ulcer of other part of left lower leg with fat layer exposed: Secondary | ICD-10-CM | POA: Insufficient documentation

## 2019-09-25 DIAGNOSIS — I87332 Chronic venous hypertension (idiopathic) with ulcer and inflammation of left lower extremity: Secondary | ICD-10-CM | POA: Diagnosis present

## 2019-09-27 NOTE — Progress Notes (Signed)
NEMESIS, RAINWATER ([id number]) Visit Report for 09/25/2019 HPI Details Patient Name: [name], [name] Date of Service: 09/25/2019 9:15 AM Medical Record Number: [id number] Patient Account Number: [id number] Date of Birth/Sex: 1976/08/28 (43 y.o. F) Treating RN: Huel Coventry Primary Care Provider: Marcelino Duster Other Clinician: Referring Provider: Marcelino Duster Treating Provider/Extender: Altamese [place] in Treatment: 6 History of Present Illness HPI Description: ADMISSION 08/14/2019 This is a 43 year old woman who is here for review of a longstanding wound on the left anterior tibial area for perhaps as long as 1 year. 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 She recently had an IVC stent placed. She relates this wound to a complicated admission in June 2019. She was admitted to hospital for a planned hysterectomy she developed acute tubular necrosis postop. . A CT angiogram showed an IVC filter occlusion. She was transferred to [facility]. I did not look out open over these records. Apparently her kidney function recovered. 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 She has not been doing anything to this wound except for episodic Neosporin. She has not been wearing compression. 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 As I understand things she is had a recent IVC stent Past medical history multiple DVTs as noted above on chronic anticoagulation with Xarelto, postoperative acute tubular necrosis I think has completely resolved per her description although I have not verified this. ABIs in our clinic were noncompressible on the left 9/30; wound surface looks better. No mechanical debridement. We have been using Iodoflex under 3 layer compression. 10/7; wound surface had some eschar on the lower part of the circumference and some  debris on the surface. I have been using Iodoflex. Changed to [facility] today Looking through her records at [facility] I was able to find arterial studies although they did not look at anything below the knee. She does have a femoral artery stent which was felt at that time to be widely patent. The also looked for a DVT at that time the common femoral femoral veins were fully compressible 10/14; better looking wound surface that is measuring slightly smaller. Using Hydrofera Blue. They corrected me today on the femoral artery stent that I stated on 10/7 and said it was a vein stent tell need to have a better look at her [facility] records. The wound is smaller and looks healthy 10/21; scant amount of denuded skin removed with pickups and scissors. Using Hydrofera Blue. Very healthy looking wound surface 10/28; left lower extremity anterior tibial wound. 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 She has been using Hydrofera Blue under 3 layer compression. Arrives with only a small hyper granulated open area remaining. She has compression stockings that she ordered Electronic Signature(s) Signed: 09/27/2019 7:47:21 AM By: Baltazar Najjar MD [name]: Baltazar Najjar on 09/25/2019 10:31:22 Coopman, Shakemia B. ([id number]) -------------------------------------------------------------------------------- Gaynelle Adu TISS Details Patient Name: Grams, [name] Date of Service: 09/25/2019 9:15 AM Medical Record Number: [id number] Patient Account Number: [id number] Date of Birth/Sex: 1976-03-06 (43 y.o. F) Treating RN: Huel Coventry Primary Care Provider: Marcelino Duster Other Clinician: Referring Provider: Marcelino Duster Treating Provider/Extender: Maxwell Caul  Weeks in Treatment: 6 Procedure Performed for: Wound #1 Left,Anterior Lower Leg Performed By: Physician Ricard Dillon, MD Post Procedure Diagnosis Same as Pre-procedure Notes 1 silver nitrate stick used Electronic Signature(s) Signed: 09/27/2019 7:47:21 AM By: Linton Ham MD [name]: Linton Ham on 09/25/2019 10:30:43 Addison, [name] ([id number]) -------------------------------------------------------------------------------- Physical Exam Details Patient Name: Wollard, [name] Date of Service: 09/25/2019 9:15 AM Medical Record Number: [id number] Patient Account Number: [id number] Date of Birth/Sex: 02-Oct-1976 (43 y.o. F) Treating RN: Cornell Barman Primary Care Provider: Harrel Lemon Other Clinician: Referring Provider: Harrel Lemon Treating Provider/Extender: Tito Dine in Treatment: 6 Constitutional Sitting or standing Blood Pressure is within target range for patient.. Pulse regular and within target range for patient.[name] Respirations regular, non-labored and within target range.. Temperature is normal and within the target range for the patient.[name] appears in no distress. Notes Wound exam; small looking very healthy wound in the left mid tibia. Slight hyper granulation cauterized with a single silver nitrate. Should be closed by next week Electronic Signature(s) Signed: 09/27/2019 7:47:21 AM By: Linton Ham MD [name]: Linton Ham on 09/25/2019 10:33:37 Mazurowski, [name] ([id number]) -------------------------------------------------------------------------------- Physician Orders Details Patient Name: Copen, [name] Date of Service: 09/25/2019 9:15 AM Medical Record Number: [id number] Patient Account Number: [id number] Date of Birth/Sex: 17-Sep-1976 (43 y.o. F) Treating RN: Cornell Barman Primary Care Provider: Harrel Lemon Other Clinician: Referring Provider: Harrel Lemon Treating Provider/Extender: Tito Dine in Treatment:  6 Verbal / Phone Orders: No Diagnosis Coding Wound Cleansing Wound #1 Left,Anterior Lower Leg o Clean wound with Normal Saline. o May shower with protection. - Cast protector to keep wrap dry Anesthetic (add to Medication List) Wound #1 Left,Anterior Lower Leg o Topical Lidocaine 4% cream applied to wound bed prior to debridement (In Clinic Only). Primary Wound Dressing Wound #1 Left,Anterior Lower Leg o Hydrafera Blue Ready Transfer Secondary Dressing Wound #1 Left,Anterior Lower Leg o ABD pad Dressing Change Frequency Wound #1 Left,Anterior Lower Leg o Change dressing every week Follow-up Appointments Wound #1 Left,Anterior Lower Leg o Return Appointment in 1 week. o Nurse Visit as needed Edema Control Wound #1 Left,Anterior Lower Leg o 3 Layer Compression System - Left Lower Extremity - Unna to anchor Electronic Signature(s) Signed: 09/26/2019 5:19:38 PM By: Gretta Cool, BSN, RN, CWS, Kim RN, BSN Signed: 09/27/2019 7:47:21 AM By: Linton Ham MD [name]: Gretta Cool, BSN, RN, CWS, Kim on 09/25/2019 09:53:11 Walsh, [name] ([id number]) -------------------------------------------------------------------------------- Problem List Details Patient Name: Ebrahim, Maeleigh B. Date of Service: 09/25/2019 9:15 AM Medical Record Number: [id number] Patient Account Number: [id number] Date of Birth/Sex: May 08, 1976 (43 y.o. 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 Date of Service: 09/25/2019 9:15 AM Medical Record Number: [id number] Patient Account Number: [id number] Date of Birth/Sex: 05-Aug-1976 (43 y.o. F) Treating RN: Cornell Barman Primary Care Provider: Harrel Lemon Other Clinician: Referring Provider: Harrel Lemon Treating Provider/Extender: Tito Dine  in Treatment: 6 Subjective History of Present Illness (HPI) ADMISSION 08/14/2019 This is a 43 year old woman who is here for review of a longstanding wound on the left anterior tibial area for perhaps as long as 1 year. 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 She recently had an IVC stent placed. She relates this wound to a complicated admission in June 2019. She was admitted to hospital for a planned hysterectomy she developed acute tubular necrosis postop. . A CT angiogram showed an IVC filter occlusion. She was transferred to [facility]. I did not look out open over these records. Apparently her kidney function recovered. 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 She has not been doing anything to this wound except for episodic Neosporin. She has not been wearing compression. 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 As I understand things she is had a recent IVC stent Past medical history multiple DVTs as noted above on chronic anticoagulation with Xarelto, postoperative acute tubular necrosis I think has completely resolved per her description although I have not verified this. ABIs in our clinic were  noncompressible on the left 9/30; wound surface looks better. No mechanical debridement. We have been using Iodoflex under 3 layer compression. 10/7; wound surface had some eschar on the lower part of the circumference and some debris on the surface. I have been using Iodoflex. Changed to [facility] today Looking through her records at [facility] I was able to find arterial studies although they did not look at anything below the knee. She does have a femoral artery stent which was felt at that time to be widely patent. The also looked for a DVT at that time the common femoral femoral veins were fully compressible 10/14; better looking wound surface that is measuring slightly smaller. Using Hydrofera Blue. They corrected me today on the femoral artery stent that I stated on 10/7 and said it was a vein stent tell need to have a better look at her [facility] records. The wound is smaller and looks healthy 10/21; scant amount of denuded skin removed with pickups and scissors. Using Hydrofera Blue. Very healthy looking wound surface 10/28; left lower extremity anterior tibial wound. 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 She has been using Hydrofera Blue under 3 layer compression. Arrives with only a small hyper granulated open area remaining. She has compression stockings that she ordered Objective Constitutional Talsma, Erikah B. ([id number]) Sitting or standing Blood Pressure is within target range for patient.. Pulse regular and within target range for patient.[name] Respirations regular, non-labored and within target range.. Temperature is normal and within the target range for the patient.[name] appears in no distress. Vitals Time Taken: 9:15 AM, Height: 60 in, Weight: 204 lbs,  BMI: 39.8, Temperature: 98.2 F, Pulse: 65 bpm, Respiratory Rate: 16 breaths/min, Blood Pressure: 122/63 mmHg. General Notes: Wound exam; small looking very healthy wound in the left mid tibia. Slight hyper granulation cauterized with a single silver nitrate. Should be closed by next week Integumentary (Hair, Skin) Wound #1 status is Open. Original cause of wound was Blister. The wound is located on the Left,Anterior Lower Leg. The wound measures 0.4cm  length x 0.3cm width x 0.1cm depth; 0.094cm^2 area and 0.009cm^3 volume. There is Fat Layer (Subcutaneous Tissue) Exposed exposed. There is no tunneling or undermining noted. There is a small amount of serosanguineous drainage noted. The wound margin is flat and intact. There is large (67-100%) red granulation within the wound bed. There is a small (1-33%) amount of necrotic tissue within the wound bed including Adherent Slough. 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 An CHEM CAUT GRANULATION TISS procedure was performed by Maxwell CaulOBSON, Hendry Speas G, MD. Post procedure Diagnosis Wound #1: Same as Pre-Procedure Notes: 1 silver nitrate stick used Plan Wound Cleansing: Wound #1 Left,Anterior Lower Leg: Clean wound with Normal Saline. May shower with protection. - Cast protector to keep wrap dry Anesthetic (add to Medication List): Wound #1 Left,Anterior Lower Leg: Topical Lidocaine 4% cream applied to wound bed prior to debridement (In Clinic Only). Primary Wound Dressing: Newcombe, Akeylah B. ([id number]) Wound #1 Left,Anterior Lower Leg: Hydrafera Blue Ready Transfer Secondary Dressing: Wound #1 Left,Anterior Lower Leg: ABD pad Dressing Change  Frequency: Wound #1 Left,Anterior Lower Leg: Change dressing every week Follow-up Appointments: Wound #1 Left,Anterior Lower Leg: Return Appointment in 1 week. Nurse Visit as needed Edema Control: Wound #1 Left,Anterior Lower Leg: 3 Layer Compression System - Left Lower Extremity - Unna to anchor #1 Hydrofera Blue/ABD under 3 layer compression 2. Should be healed by next week she already has her compression stockings Electronic Signature(s) Signed: 09/27/2019 7:47:21 AM By: Baltazar Najjarobson, Dequann Vandervelden MD [name]: Baltazar Najjarobson, Dezerae Freiberger on 09/25/2019 10:34:23 Cadmus, [name] ([id number]) -------------------------------------------------------------------------------- SuperBill Details Patient Name: Egelhoff, Athalene B. Date of Service: 09/25/2019 Medical Record Number: [id number] Patient Account Number: [id number] Date of Birth/Sex: 04/07/1976 (43 y.o. 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 Facility Procedures CPT4 Code Description: [id number] - CHEM CAUT GRANULATION TISS ICD-10 Diagnosis Description 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 Modifier: ed to breakdown Quantity: 1 of skin Physician Procedures CPT4 Code Description: [id number] - WC PHYS CHEM CAUT GRAN TISSUE ICD-10 Diagnosis Description 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e Modifier: d to breakdown Quantity: 1 of skin Electronic Signature(s) Signed: 09/27/2019 7:47:21 AM By: Baltazar Najjarobson, Khiana Camino MD [name]: Baltazar Najjarobson, Mccayla Shimada on 09/25/2019 10:36:53

## 2019-09-27 NOTE — Progress Notes (Signed)
[name], Helyn B. ([id number]) Visit Report for 09/25/2019 Arrival Information Details Patient Name: Zane, Dinah BeersGINGER B. Date of Service: 09/25/2019 9:15 AM Medical Record Number: [id number] Patient Account Number: [id number] Date of Birth/Sex: 03/19/1976 (43 y.o. F) Treating RN: Arnette NorrisBiell, Kristina Primary Care Armie Moren: Marcelino DusterJOHNSTON, JOHN Other Clinician: Referring Adalynn Corne: Marcelino DusterJOHNSTON, JOHN Treating Chace Bisch/Extender: Altamese CarolinaOBSON, MICHAEL G Weeks in Treatment: 6 Visit Information History Since Last Visit Added or deleted any medications: No Patient Arrived: Ambulatory Any new allergies or adverse reactions: No Arrival Time: 09:17 Had a fall or experienced change in No Accompanied By: self activities of daily living that may affect Transfer Assistance: None risk of falls: Patient Identification Verified: Yes Signs or symptoms of abuse/neglect since last visito No Secondary Verification Process Yes Hospitalized since last visit: No Completed: Has Dressing in Place as Prescribed: Yes Patient Has Alerts: Yes Has Compression in Place as Prescribed: Yes Patient Alerts: Patient on Blood Pain Present Now: No Thinner Xarelto Electronic Signature(s) Signed: 09/26/2019 4:15:06 PM By: Arnette NorrisBiell, Kristina [name]: Arnette NorrisBiell, Kristina on 09/25/2019 09:17:45 Stoy, [name] ([id number]) -------------------------------------------------------------------------------- Encounter Discharge Information Details Patient Name: Stiver, Licet B. Date of Service: 09/25/2019 9:15 AM Medical Record Number: [id number] Patient Account Number: [id number] Date of Birth/Sex: 02/17/1976 (43 y.o. F) Treating RN: Huel CoventryWoody, Kim Primary Care Jalayna Josten: Marcelino DusterJOHNSTON, JOHN Other Clinician: Referring Vearl Aitken: Marcelino DusterJOHNSTON, JOHN Treating Sneijder Bernards/Extender: Altamese CarolinaOBSON, MICHAEL G Weeks in Treatment: 6 Encounter Discharge Information Items Discharge Condition: Stable Ambulatory Status: Ambulatory Discharge Destination: Home Transportation: Private  Auto Accompanied By: self Schedule Follow-up Appointment: Yes Clinical Summary of Care: Electronic Signature(s) Signed: 09/26/2019 5:19:38 PM By: Elliot GurneyWoody, BSN, RN, CWS, Kim RN, BSN [name]: Elliot GurneyWoody, BSN, RN, CWS, Kim on 09/25/2019 09:54:11 Hast, [name] ([id number]) -------------------------------------------------------------------------------- Lower Extremity Assessment Details Patient Name: Morrow, Gaylin B. Date of Service: 09/25/2019 9:15 AM Medical Record Number: [id number] Patient Account Number: [id number] Date of Birth/Sex: 03/04/1976 (43 y.o. F) Treating RN: Arnette NorrisBiell, Kristina Primary Care Batul Diego: Marcelino DusterJOHNSTON, JOHN Other Clinician: Referring Duchess Armendarez: Marcelino DusterJOHNSTON, JOHN Treating Dreden Rivere/Extender: Altamese CarolinaOBSON, MICHAEL G Weeks in Treatment: 6 Edema Assessment Assessed: [Left: No] [Right: No] [Left: Edema] [Right: :] Calf Left: Right: Point of Measurement: 32 cm From Medial Instep 42.7 cm cm Ankle Left: Right: Point of Measurement: 8 cm From Medial Instep 22.5 cm cm Vascular Assessment Pulses: Dorsalis Pedis Palpable: [Left:Yes] Posterior Tibial Palpable: [Left:Yes] Electronic Signature(s) Signed: 09/26/2019 4:15:06 PM By: Arnette NorrisBiell, Kristina [name]: Arnette NorrisBiell, Kristina on 09/25/2019 09:21:20 Penaflor, Ashleigh B. ([id number]) -------------------------------------------------------------------------------- Multi Wound Chart Details Patient Name: Hopkinson, Dellia B. Date of Service: 09/25/2019 9:15 AM Medical Record Number: [id number] Patient Account Number: [id number] Date of Birth/Sex: 12/31/1975 (43 y.o. F) Treating RN: Huel CoventryWoody, Kim Primary Care Refugio Vandevoorde: Marcelino DusterJOHNSTON, JOHN Other Clinician: Referring Torrin Frein: Marcelino DusterJOHNSTON, JOHN Treating Semaje Kinker/Extender: Altamese CarolinaOBSON, MICHAEL G Weeks in Treatment: 6 Vital Signs Height(in): 60 Pulse(bpm): 65 Weight(lbs): 204 Blood Pressure(mmHg): 122/63 Body Mass Index(BMI): 40 Temperature(F): 98.2 Respiratory Rate 16 (breaths/min): Photos: [N/A:N/A] Wound  Location: Left Lower Leg - Anterior N/A N/A Wounding Event: Blister N/A N/A Primary Etiology: Venous Leg Ulcer N/A N/A Date Acquired: 05/21/2018 N/A N/A Weeks of Treatment: 6 N/A N/A Wound Status: Open N/A N/A Measurements L x W x D 0.4x0.3x0.1 N/A N/A (cm) Area (cm) : 0.094 N/A N/A Volume (cm) : 0.009 N/A N/A % Reduction in Area: 90.80% N/A N/A % Reduction in Volume: 91.20% N/A N/A Classification: Partial Thickness N/A N/A Exudate Amount: Small N/A N/A Exudate Type: Serosanguineous N/A N/A Exudate Color: red, brown N/A N/A Wound Margin: Flat and Intact N/A N/A Granulation Amount:  Large (67-100%) N/A N/A Granulation Quality: Red N/A N/A Necrotic Amount: Small (1-33%) N/A N/A Exposed Structures: Fat Layer (Subcutaneous N/A N/A Tissue) Exposed: Yes Fascia: No Tendon: No Muscle: No Joint: No Bone: No Epithelialization: Large (67-100%) N/A N/A Procedures Performed: N/A N/A CALEDONIA, ZOU ([id number]) CHEM CAUT GRANULATION TISS Treatment Notes Wound #1 (Left, Anterior Lower Leg) Notes Hydrofera Blue, ABD, 3L(L), unna to anchor Electronic Signature(s) Signed: 09/27/2019 7:47:21 AM By: Linton Ham MD [name]: Linton Ham on 09/25/2019 10:30:35 Galli, [name] ([id number]) -------------------------------------------------------------------------------- Startex Details Patient Name: Stetler, Serene B. Date of Service: 09/25/2019 9:15 AM Medical Record Number: [id number] Patient Account Number: [id number] Date of Birth/Sex: [birth year] (43 y.o. F) Treating RN: Cornell Barman Primary Care Latanza Pfefferkorn: Harrel Lemon Other Clinician: Referring Zully Frane: Harrel Lemon Treating Arnaldo Heffron/Extender: Tito Dine in Treatment: 6 Active Inactive Necrotic Tissue Nursing Diagnoses: Impaired tissue integrity related to necrotic/devitalized tissue Goals: Necrotic/devitalized tissue will be minimized in the wound bed Date Initiated: 08/14/2019 Target  Resolution Date: 08/21/2019 Goal Status: Active Interventions: Assess patient pain level pre-, during and post procedure and prior to discharge Treatment Activities: Apply topical anesthetic as ordered : 08/14/2019 Notes: Orientation to the Wound Care Program Nursing Diagnoses: Knowledge deficit related to the wound healing center program Goals: Patient/caregiver will verbalize understanding of the [facility] Date Initiated: 08/14/2019 Target Resolution Date: 08/21/2019 Goal Status: Active Interventions: Provide education on orientation to the wound center Notes: Wound/Skin Impairment Nursing Diagnoses: Impaired tissue integrity Goals: Ulcer/skin breakdown will have a volume reduction of 30% by week 4 Date Initiated: 08/14/2019 Target Resolution Date: 09/11/2019 Goal Status: Active Mollenkopf, KAAVYA PUSKARICH ([id number]) Interventions: Assess ulceration(s) every visit Treatment Activities: Referred to DME Maisyn Nouri for dressing supplies : 08/14/2019 Notes: Electronic Signature(s) Signed: 09/26/2019 5:19:38 PM By: Gretta Cool, BSN, RN, CWS, Kim RN, BSN [name]: Gretta Cool, BSN, RN, CWS, Kim on 09/25/2019 09:51:42 Gobert, [name] ([id number]) -------------------------------------------------------------------------------- Pain Assessment Details Patient Name: Kirkeby, Bergen B. Date of Service: 09/25/2019 9:15 AM Medical Record Number: [id number] Patient Account Number: [id number] Date of Birth/Sex: October 19, 1976 (43 y.o. F) Treating RN: Harold Barban Primary Care Cammy Sanjurjo: Harrel Lemon Other Clinician: Referring Xareni Kelch: Harrel Lemon Treating Jisella Ashenfelter/Extender: Tito Dine in Treatment: 6 Active Problems Location of Pain Severity and Description of Pain Patient Has Paino No Site Locations Pain Management and Medication Current Pain Management: Electronic Signature(s) Signed: 09/26/2019 4:15:06 PM By: Harold Barban [name]: Harold Barban on 09/25/2019  09:18:18 Fredericksen, [name] ([id number]) -------------------------------------------------------------------------------- Patient/Caregiver Education Details Patient Name: Mccolm, Jenissa B. Date of Service: 09/25/2019 9:15 AM Medical Record Number: [id number] Patient Account Number: [id number] Date of Birth/Gender: 1975-11-28 (43 y.o. F) Treating RN: Cornell Barman Primary Care Physician: Harrel Lemon Other Clinician: Referring Physician: Harrel Lemon Treating Physician/Extender: Tito Dine in Treatment: 6 Education Assessment Education Provided To: Patient Education Topics Provided Venous: Handouts: Controlling Swelling with Multilayered Compression Wraps Methods: Demonstration, Explain/Verbal Responses: State content correctly Electronic Signature(s) Signed: 09/26/2019 5:19:38 PM By: Gretta Cool, BSN, RN, CWS, Kim RN, BSN [name]: Gretta Cool, BSN, RN, CWS, Kim on 09/25/2019 09:53:39 Saintvil, [name] ([id number]) -------------------------------------------------------------------------------- Wound Assessment Details Patient Name: Reaney, Shalini B. Date of Service: 09/25/2019 9:15 AM Medical Record Number: [id number] Patient Account Number: [id number] Date of Birth/Sex: 04-24-1976 (43 y.o. F) Treating RN: Harold Barban Primary Care Muhannad Bignell: Harrel Lemon Other Clinician: Referring Joscelyn Hardrick: Harrel Lemon Treating Surena Welge/Extender: Tito Dine in Treatment: 6 Wound Status Wound Number: 1 Primary Etiology: Venous Leg Ulcer Wound Location: Left  Lower Leg - Anterior Wound Status: Open Wounding Event: Blister Date Acquired: 05/21/2018 Weeks Of Treatment: 6 Clustered Wound: No Photos Wound Measurements Length: (cm) 0.4 Width: (cm) 0.3 Depth: (cm) 0.1 Area: (cm) 0.094 Volume: (cm) 0.009 % Reduction in Area: 90.8% % Reduction in Volume: 91.2% Epithelialization: Large (67-100%) Tunneling: No Undermining: No Wound Description Classification: Partial  Thickness Foul Odor A Wound Margin: Flat and Intact Slough/Fibr Exudate Amount: Small Exudate Type: Serosanguineous Exudate Color: red, brown fter Cleansing: No ino Yes Wound Bed Granulation Amount: Large (67-100%) Exposed Structure Granulation Quality: Red Fascia Exposed: No Necrotic Amount: Small (1-33%) Fat Layer (Subcutaneous Tissue) Exposed: Yes Necrotic Quality: Adherent Slough Tendon Exposed: No Muscle Exposed: No Joint Exposed: No Bone Exposed: No Treatment Notes Scribner, Delcenia B. ([id number]) Wound #1 (Left, Anterior Lower Leg) Notes Hydrofera Blue, ABD, 3L(L), unna to anchor Electronic Signature(s) Signed: 09/26/2019 4:15:06 PM By: Arnette Norris [name]: Arnette Norris on 09/25/2019 09:24:14 Meares, Daisee B. ([id number]) -------------------------------------------------------------------------------- Vitals Details Patient Name: Plaisted, Taylin B. Date of Service: 09/25/2019 9:15 AM Medical Record Number: [id number] Patient Account Number: [id number] Date of Birth/Sex: [birth year] (43 y.o. F) Treating RN: Arnette Norris Primary Care Dolce Sylvia: Marcelino Duster Other Clinician: Referring Abel Hageman: Marcelino Duster Treating Tiffiney Sparrow/Extender: Altamese Numa in Treatment: 6 Vital Signs Time Taken: 09:15 Temperature (F): 98.2 Height (in): 60 Pulse (bpm): 65 Weight (lbs): 204 Respiratory Rate (breaths/min): 16 Body Mass Index (BMI): 39.8 Blood Pressure (mmHg): 122/63 Reference Range: 80 - 120 mg / dl Electronic Signature(s) Signed: 09/26/2019 4:15:06 PM By: Arnette Norris [name]: Arnette Norris on 09/25/2019 99:24:26

## 2019-10-02 ENCOUNTER — Other Ambulatory Visit: Payer: Self-pay

## 2019-10-02 ENCOUNTER — Encounter: Payer: BC Managed Care – PPO | Admitting: Internal Medicine

## 2019-10-02 DIAGNOSIS — I87332 Chronic venous hypertension (idiopathic) with ulcer and inflammation of left lower extremity: Secondary | ICD-10-CM | POA: Diagnosis not present

## 2019-10-03 NOTE — Progress Notes (Signed)
CAMREN, HENTHORN ([id number]) Visit Report for 10/02/2019 Debridement Details Patient Name: [name], [name] Date of Service: 10/02/2019 9:30 AM Medical Record Number: [id number] Patient Account Number: [id number] Date of Birth/Sex: 25-Oct-1976 (43 y.o. F) Treating RN: Huel Coventry Primary Care Provider: Marcelino Duster Other Clinician: Referring Provider: Marcelino Duster Treating Provider/Extender: Altamese [place] in Treatment: 7 Debridement Performed for Wound #1 Left,Anterior Lower Leg Assessment: Performed By: Physician Maxwell Caul, MD Debridement Type: Debridement Severity of Tissue Pre Fat layer exposed Debridement: Level of Consciousness (Pre- Awake and Alert procedure): Pre-procedure Verification/Time Yes - 09:53 Out Taken: Start Time: 09:53 Pain Control: Lidocaine Total Area Debrided (L x W): 0.8 (cm) x 0.7 (cm) = 0.56 (cm) Tissue and other material Viable, Non-Viable, Eschar, Subcutaneous debrided: Level: Skin/Subcutaneous Tissue Debridement Description: Excisional Instrument: Curette Bleeding: Minimum Hemostasis Achieved: Pressure End Time: 09:55 Response to Treatment: Procedure was tolerated well Level of Consciousness Awake and Alert (Post-procedure): Post Debridement Measurements of Total Wound Length: (cm) 0.8 Width: (cm) 0.7 Depth: (cm) 0.1 Volume: (cm) 0.044 Character of Wound/Ulcer Post Debridement: Stable Severity of Tissue Post Debridement: Fat layer exposed Post Procedure Diagnosis Same as Pre-procedure Electronic Signature(s) Signed: 10/02/2019 5:21:25 PM By: Elliot Gurney, BSN, RN, CWS, Kim RN, BSN Signed: 10/02/2019 5:41:49 PM By: Baltazar Najjar MD [name]: Baltazar Najjar on 10/02/2019 10:06:27 [name], [name] ([id number]) -------------------------------------------------------------------------------- HPI Details Patient Name: [name], [name] Date of Service: 10/02/2019 9:30 AM Medical Record Number: [id number] Patient  Account Number: [id number] Date of Birth/Sex: [birth year] (43 y.o. F) Treating RN: Huel Coventry Primary Care Provider: Marcelino Duster Other Clinician: Referring Provider: Marcelino Duster Treating Provider/Extender: Altamese [place] in Treatment: 7 History of Present Illness HPI Description: ADMISSION 08/14/2019 This is a 43 year old woman who is here for review of a longstanding wound on the left anterior tibial area for perhaps as long as 1 year. 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 She recently had an IVC stent placed. She relates this wound to a complicated admission in June 2019. She was admitted to hospital for a planned hysterectomy she developed acute tubular necrosis postop. . A CT angiogram showed an IVC filter occlusion. She was transferred to [facility]. I did not look out open over these records. Apparently her kidney function recovered. 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 She has not been doing anything to this wound except for episodic Neosporin. She has not been wearing compression. 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 As I understand things she is had a recent IVC stent Past medical history multiple DVTs as noted above on chronic anticoagulation with Xarelto, postoperative acute tubular necrosis I think has completely resolved per her description although I have not verified this. ABIs in our clinic were noncompressible on the left 9/30; wound surface looks better. No mechanical debridement. We have been using Iodoflex under 3 layer compression. 10/7; wound surface had some eschar on the lower part of the circumference and some debris on the surface. I have been using Iodoflex. Changed to [facility] today Looking through her records at [facility] I was able to find arterial studies although they did not look  at anything below the knee. She does have a femoral artery stent which was felt at that time to be widely patent. The also looked for a DVT at that  time the common femoral femoral veins were fully compressible 10/14; better looking wound surface that is measuring slightly smaller. Using Hydrofera Blue. They corrected me today on the femoral artery stent that I stated on 10/7 and said it was a vein stent tell need to have a better look at her [facility] records. The wound is smaller and looks healthy 10/21; scant amount of denuded skin removed with pickups and scissors. Using Hydrofera Blue. Very healthy looking wound surface 10/28; left lower extremity anterior tibial wound. 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 She has been using Hydrofera Blue under 3 layer compression. Arrives with only a small hyper granulated open area remaining. She has compression stockings that she ordered 11/11; left lower extremity anterior tibial wound. She has been using Hydrofera Blue under 3 layer compression. Arrives today with eschar over the wound. Electronic Signature(s) Signed: 10/02/2019 5:41:49 PM By: Baltazar Najjar MD [name]: Baltazar Najjar on 10/02/2019 10:10:39 [name], [name] ([id number]) -------------------------------------------------------------------------------- Physical Exam Details Patient Name: [name], [name] Date of Service: 10/02/2019 9:30 AM Medical Record Number: [id number] Patient Account Number: [id number] Date of Birth/Sex: January 01, 1976 (43 y.o. F) Treating RN: Huel Coventry Primary Care Provider: Marcelino Duster Other Clinician: Referring Provider: Marcelino Duster Treating Provider/Extender: Altamese [place] in Treatment:  7 Constitutional Sitting or standing Blood Pressure is within target range for patient.. Pulse regular and within target range for patient.[name] Respirations regular, non-labored and within target range.. Temperature is normal and within the target range for the patient.[name] appears in no distress. Notes Wound exam; debridement #3 curette left mid tibia. Only a very small open area remains most of this is epithelialized but still there is a draining area no evidence of surrounding infection Electronic Signature(s) Signed: 10/02/2019 5:41:49 PM By: Baltazar Najjar MD [name]: Baltazar Najjar on 10/02/2019 10:11:28 [name], [name] ([id number]) -------------------------------------------------------------------------------- Physician Orders Details Patient Name: [name], [name] Date of Service: 10/02/2019 9:30 AM Medical Record Number: [id number] Patient Account Number: [id number] Date of Birth/Sex: 06-05-1976 (43 y.o. F) Treating RN: Huel Coventry Primary Care Provider: Marcelino Duster Other Clinician: Referring Provider: Marcelino Duster Treating Provider/Extender: Altamese  in Treatment: 7 Verbal / Phone Orders: No Diagnosis Coding Wound Cleansing Wound #1 Left,Anterior Lower Leg o Clean wound with Normal Saline. o May shower with protection. - Cast protector to keep wrap dry Anesthetic (add to Medication List) Wound #1 Left,Anterior Lower Leg o Topical Lidocaine 4% cream applied to wound bed prior to debridement (In Clinic Only). Primary Wound Dressing Wound #1 Left,Anterior Lower Leg o Silver Collagen Secondary Dressing Wound #1 Left,Anterior Lower Leg o ABD pad Dressing Change Frequency Wound #1 Left,Anterior Lower Leg o Change dressing every week Follow-up Appointments Wound #1 Left,Anterior Lower Leg o Return Appointment in 1 week. o Nurse Visit as needed Edema Control Wound #1 Left,Anterior Lower Leg o 3 Layer Compression System - Left Lower  Extremity - Unna to anchor Electronic Signature(s) Signed: 10/02/2019 5:21:25 PM By: Elliot Gurney, BSN, RN, CWS, Kim RN, BSN Signed: 10/02/2019 5:41:49 PM By: Baltazar Najjar MD [name]: Elliot Gurney, BSN, RN, CWS, Kim on 10/02/2019 09:56:25 [name], [name] ([id number]) -------------------------------------------------------------------------------- Problem List Details Patient Name: [name], [name] Date of Service: 10/02/2019 9:30 AM Medical Record Number: [id number] Patient Account Number: [id number] Date of Birth/Sex: 1976/01/08 (43 y.o. 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 Date of Service: 10/02/2019 9:30 AM Medical Record Number: [id number] Patient Account Number: [id number] Date of Birth/Sex: [birth year] (43 y.o. F) Treating RN: Cornell Barman Primary Care Provider: Harrel Lemon Other Clinician: Referring Provider: Harrel Lemon Treating Provider/Extender: Tito Dine in Treatment: 7 Subjective History of Present Illness (HPI) ADMISSION 08/14/2019 This is a 43 year old woman who is here for review of a longstanding wound on the left anterior tibial area for  perhaps as long as 1 year. 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 She recently had an IVC stent placed. She relates this wound to a complicated admission in June 2019. She was admitted to hospital for a planned hysterectomy she developed acute tubular necrosis postop. . A CT angiogram showed an IVC filter occlusion. She was transferred to [facility]. I did not look out open over these records. Apparently her kidney function recovered. 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 She has not been doing anything to this wound except for episodic Neosporin. She has not been wearing compression. 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 As I understand things she is had a recent IVC stent Past medical history multiple DVTs as noted above on chronic anticoagulation with Xarelto, postoperative acute tubular necrosis I think has completely resolved per her description although I have not verified this. ABIs in our clinic were noncompressible on the left 9/30; wound surface looks better. No mechanical debridement. We have been using Iodoflex under 3 layer compression. 10/7; wound surface had some eschar on the lower part of the circumference and some debris on the surface. I have been using Iodoflex. Changed to [facility] today Looking through her records at [facility] I was able to find arterial studies although they did not look at anything below the knee. She does have a femoral artery stent which was felt at that time to be widely patent. The also looked for a DVT at that time the common femoral femoral veins were fully compressible 10/14; better looking wound surface that is measuring slightly smaller. Using Hydrofera Blue. They corrected me today on the femoral artery stent that I stated on 10/7 and said it was a vein stent tell need to have a  better look at her [facility] records. The wound is smaller and looks healthy 10/21; scant amount of denuded skin removed with pickups and scissors. Using Hydrofera Blue. Very healthy looking wound surface 10/28; left lower extremity anterior tibial wound. 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 She has been using Hydrofera Blue under 3 layer  compression. Arrives with only a small hyper granulated open area remaining. She has compression stockings that she ordered 11/11; left lower extremity anterior tibial wound. She has been using Hydrofera Blue under 3 layer compression. Arrives today with eschar over the wound. Objective [name], [name] ([id number]) Constitutional Sitting or standing Blood Pressure is within target range for patient.. Pulse regular and within target range for patient.[name] Respirations regular, non-labored and within target range.. Temperature is normal and within the target range for the patient.[name] appears in no distress. Vitals Time Taken: 9:37 AM, Height: 60 in, Weight: 204 lbs, BMI: 39.8, Temperature: 98.9 F, Pulse: 70 bpm, Respiratory Rate: 16 breaths/min, Blood Pressure: 130/83 mmHg. General Notes: Wound exam; debridement #3 curette left mid tibia. Only a very small open area remains most of this is epithelialized but still there is a draining area no evidence of surrounding infection Integumentary (Hair, Skin) Wound #1 status is Open. Original cause of wound was Blister. The wound is located on the Left,Anterior Lower Leg. The wound measures 0.8cm length x 0.7cm width x 0.1cm depth; 0.44cm^2 area and 0.044cm^3 volume. There is Fat Layer (Subcutaneous Tissue) Exposed exposed. There is no tunneling or undermining noted. There is a small amount  of serosanguineous drainage noted. The wound margin is flat and intact. There is large (67-100%) red granulation within the wound bed. There is a small (1-33%) amount of necrotic tissue within the wound bed including Adherent Slough. 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 There was a Excisional Skin/Subcutaneous Tissue Debridement with a total area of 0.56 sq cm performed by Maxwell Caul, MD. With the following instrument(s): Curette to remove Viable and Non-Viable tissue/material. Material removed includes Eschar and Subcutaneous Tissue and after achieving pain control using Lidocaine. No specimens were taken. A time out was conducted at 09:53, prior to the start of the procedure. A Minimum amount of bleeding was controlled with Pressure. The procedure was tolerated well. Post Debridement Measurements: 0.8cm length x 0.7cm width x 0.1cm depth; 0.044cm^3 volume. Character of Wound/Ulcer Post Debridement is stable. Severity of Tissue Post Debridement is: Fat layer exposed. Post procedure Diagnosis Wound #1: Same as Pre-Procedure Plan [name], [name] ([id number]) Wound Cleansing: Wound #1 Left,Anterior Lower Leg: Clean wound with Normal Saline. May shower with protection. - Cast protector to keep wrap dry Anesthetic (add to Medication List): Wound #1 Left,Anterior Lower Leg: Topical Lidocaine 4% cream applied to wound bed prior to debridement (In Clinic Only). Primary Wound Dressing: Wound #1 Left,Anterior Lower Leg: Silver Collagen Secondary Dressing: Wound #1 Left,Anterior Lower Leg: ABD pad Dressing Change Frequency: Wound #1  Left,Anterior Lower Leg: Change dressing every week Follow-up Appointments: Wound #1 Left,Anterior Lower Leg: Return Appointment in 1 week. Nurse Visit as needed Edema Control: Wound #1 Left,Anterior Lower Leg: 3 Layer Compression System - Left Lower Extremity - Unna to anchor 1. I have moved to silver collagen today ABD still under 3 layer compression. Patient very disappointed it was not healed today. It is close Psychologist, prison and probation services) Signed: 10/02/2019 5:41:49 PM By: Baltazar Najjar MD [name]: Baltazar Najjar on 10/02/2019 10:13:34 [name], [name] ([id number]) -------------------------------------------------------------------------------- SuperBill Details Patient Name: [name], [name] Date of Service: 10/02/2019 Medical Record  Number: [id number] Patient Account Number: [id number] Date of Birth/Sex: 05/04/1976 (43 y.o. 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 Facility Procedures CPT4 Code Description: [id number] 11042 - DEB SUBQ TISSUE 20 SQ CM/< ICD-10 Diagnosis Description 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 Modifier: ed to breakdown Quantity: 1 of skin Physician Procedures CPT4 Code Description: [id number] 11042 - WC PHYS SUBQ TISS 20 SQ CM ICD-10 Diagnosis Description 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 Modifier: ed to breakdown Quantity: 1 of skin Electronic Signature(s) Signed: 10/02/2019 5:41:49 PM By: Baltazar Najjarobson, Micayla Brathwaite MD [name]: Baltazar Najjarobson, Malaiya Paczkowski on 10/02/2019 10:13:51

## 2019-10-03 NOTE — Progress Notes (Signed)
[name] ([id number]) Visit Report for 10/02/2019 Arrival Information Details Patient Name: [name], [name] Date of Service: 10/02/2019 9:30 AM Medical Record Number: [id number] Patient Account Number: [id number] Date of Birth/Sex: 23-Sep-1976 (43 y.o. F) Treating RN: Rodell Perna Primary Care Deforest Maiden: Marcelino Duster Other Clinician: Referring Kai Calico: Marcelino Duster Treating Davione Lenker/Extender: Altamese [place] in Treatment: 7 Visit Information History Since Last Visit Added or deleted any medications: No Patient Arrived: Ambulatory Any new allergies or adverse reactions: No Arrival Time: 09:37 Had a fall or experienced change in No Accompanied By: son activities of daily living that may affect Transfer Assistance: None risk of falls: Patient Identification Verified: Yes Signs or symptoms of abuse/neglect since last visito No Patient Has Alerts: Yes Hospitalized since last visit: No Patient Alerts: Patient on Blood Thinner Has Dressing in Place as Prescribed: Yes Xarelto Pain Present Now: No Electronic Signature(s) Signed: 10/03/2019 3:26:34 PM By: Rodell Perna [name]: Rodell Perna on 10/02/2019 09:37:17 [name] ([id number]) -------------------------------------------------------------------------------- Encounter Discharge Information Details Patient Name: [name], [name] Date of Service: 10/02/2019 9:30 AM Medical Record Number: [id number] Patient Account Number: [id number] Date of Birth/Sex: [birth year] (43 y.o. F) Treating RN: Huel Coventry Primary Care Terril Amaro: Marcelino Duster Other Clinician: Referring Katalina Magri: Marcelino Duster Treating Daejon Lich/Extender: Altamese [place] in Treatment: 7 Encounter Discharge Information Items Post Procedure Vitals Discharge Condition: Stable Temperature (F): 98.9 Ambulatory Status: Ambulatory Pulse (bpm): 70 Discharge Destination: Home Respiratory Rate (breaths/min): 16 Transportation: Private  Auto Blood Pressure (mmHg): 130/83 Accompanied By: son Schedule Follow-up Appointment: Yes Clinical Summary of Care: Electronic Signature(s) Signed: 10/02/2019 5:21:25 PM By: Elliot Gurney, BSN, RN, CWS, Kim RN, BSN [name]: Elliot Gurney, BSN, RN, CWS, Kim on 10/02/2019 09:57:56 Minahan, [name] ([id number]) -------------------------------------------------------------------------------- Lower Extremity Assessment Details Patient Name: [name], [name] Date of Service: 10/02/2019 9:30 AM Medical Record Number: [id number] Patient Account Number: [id number] Date of Birth/Sex: [birth year] (43 y.o. F) Treating RN: Rodell Perna Primary Care Terris Germano: Marcelino Duster Other Clinician: Referring Delaynee Alred: Marcelino Duster Treating Jaelin Devincentis/Extender: Altamese [place] in Treatment: 7 Edema Assessment Assessed: [Left: No] [Right: No] Edema: [Left: N] [Right: o] Calf Left: Right: Point of Measurement: 32 cm From Medial Instep 42 cm cm Ankle Left: Right: Point of Measurement: 8 cm From Medial Instep 22 cm cm Vascular Assessment Pulses: Dorsalis Pedis Palpable: [Left:Yes] Electronic Signature(s) Signed: 10/03/2019 3:26:34 PM By: Rodell Perna [name]: Rodell Perna on 10/02/2019 09:44:55 Bang, Keeara B. ([id number]) -------------------------------------------------------------------------------- Multi Wound Chart Details Patient Name: [name], [name] Date of Service: 10/02/2019 9:30 AM Medical Record Number: [id number] Patient Account Number: [id number] Date of Birth/Sex: March 18, 1976 (43 y.o. F) Treating RN: Huel Coventry Primary Care Johnnie Goynes: Marcelino Duster Other Clinician: Referring Aine Strycharz: Marcelino Duster Treating Fanchon Papania/Extender: Altamese [place] in Treatment: 7 Vital Signs Height(in): 60 Pulse(bpm): 70 Weight(lbs): 204 Blood Pressure(mmHg): 130/83 Body Mass Index(BMI): 40 Temperature(F): 98.9 Respiratory Rate 16 (breaths/min): Photos: [N/A:N/A] Wound Location: Left  Lower Leg - Anterior N/A N/A Wounding Event: Blister N/A N/A Primary Etiology: Venous Leg Ulcer N/A N/A Date Acquired: 05/21/2018 N/A N/A Weeks of Treatment: 7 N/A N/A Wound Status: Open N/A N/A Measurements L x W x D 0.8x0.7x0.1 N/A N/A (cm) Area (cm) : 0.44 N/A N/A Volume (cm) : 0.044 N/A N/A % Reduction in Area: 56.90% N/A N/A % Reduction in Volume: 56.90% N/A N/A Classification: Partial Thickness N/A N/A Exudate Amount: Small N/A N/A Exudate Type: Serosanguineous N/A N/A Exudate Color: red, brown N/A N/A Wound Margin: Flat and Intact N/A N/A  Granulation Amount: Large (67-100%) N/A N/A Granulation Quality: Red N/A N/A Necrotic Amount: Small (1-33%) N/A N/A Exposed Structures: Fat Layer (Subcutaneous N/A N/A Tissue) Exposed: Yes Fascia: No Tendon: No Muscle: No Joint: No Bone: No Epithelialization: Large (67-100%) N/A N/A Debridement: Debridement - Excisional N/A N/A [name], [name] ([id number]) Pre-procedure 09:53 N/A N/A Verification/Time Out Taken: Pain Control: Lidocaine N/A N/A Tissue Debrided: Necrotic/Eschar, N/A N/A Subcutaneous Level: Skin/Subcutaneous Tissue N/A N/A Debridement Area (sq cm): 0.56 N/A N/A Instrument: Curette N/A N/A Bleeding: Minimum N/A N/A Hemostasis Achieved: Pressure N/A N/A Debridement Treatment Procedure was tolerated well N/A N/A Response: Post Debridement 0.8x0.7x0.1 N/A N/A Measurements L x W x D (cm) Post Debridement Volume: 0.044 N/A N/A (cm) Procedures Performed: Debridement N/A N/A Treatment Notes Wound #1 (Left, Anterior Lower Leg) Notes Prisma Ag, ABD, 3L(L), unna to anchor Electronic Signature(s) Signed: 10/02/2019 5:41:49 PM By: Baltazar Najjarobson, Michael MD [name]: Baltazar Najjarobson, Michael on 10/02/2019 10:06:13 Roudebush, [name] ([id number]) -------------------------------------------------------------------------------- Multi-Disciplinary Care Plan Details Patient Name: [name], [name] Date of Service: 10/02/2019 9:30  AM Medical Record Number: [id number] Patient Account Number: [id number] Date of Birth/Sex: 01/27/1976 (43 y.o. F) Treating RN: Huel CoventryWoody, Kim Primary Care Ltanya Bayley: Marcelino DusterJOHNSTON, JOHN Other Clinician: Referring Alonnie Bieker: Marcelino DusterJOHNSTON, JOHN Treating Mertha Clyatt/Extender: Altamese CarolinaOBSON, MICHAEL G Weeks in Treatment: 7 Active Inactive Necrotic Tissue Nursing Diagnoses: Impaired tissue integrity related to necrotic/devitalized tissue Goals: Necrotic/devitalized tissue will be minimized in the wound bed Date Initiated: 08/14/2019 Target Resolution Date: 08/21/2019 Goal Status: Active Interventions: Assess patient pain level pre-, during and post procedure and prior to discharge Treatment Activities: Apply topical anesthetic as ordered : 08/14/2019 Notes: Orientation to the Wound Care Program Nursing Diagnoses: Knowledge deficit related to the wound healing center program Goals: Patient/caregiver will verbalize understanding of the Wound Healing Center Program Date Initiated: 08/14/2019 Target Resolution Date: 08/21/2019 Goal Status: Active Interventions: Provide education on orientation to the wound center Notes: Wound/Skin Impairment Nursing Diagnoses: Impaired tissue integrity Goals: Ulcer/skin breakdown will have a volume reduction of 30% by week 4 Date Initiated: 08/14/2019 Target Resolution Date: 09/11/2019 Goal Status: Active Gardiner, [name] ([id number]) Interventions: Assess ulceration(s) every visit Treatment Activities: Referred to DME Brandee Markin for dressing supplies : 08/14/2019 Notes: Electronic Signature(s) Signed: 10/02/2019 5:21:25 PM By: Elliot GurneyWoody, BSN, RN, CWS, Kim RN, BSN [name]: Elliot GurneyWoody, BSN, RN, CWS, Kim on 10/02/2019 09:54:07 Senteno, [name] ([id number]) -------------------------------------------------------------------------------- Pain Assessment Details Patient Name: [name], [name] Date of Service: 10/02/2019 9:30 AM Medical Record Number: [id number] Patient Account  Number: [id number] Date of Birth/Sex: 06/19/1976 (43 y.o. F) Treating RN: Rodell PernaScott, Dajea Primary Care Lamond Glantz: Marcelino DusterJOHNSTON, JOHN Other Clinician: Referring Amy Dorado: Marcelino DusterJOHNSTON, JOHN Treating Jamir Rone/Extender: Altamese CarolinaOBSON, MICHAEL G Weeks in Treatment: 7 Active Problems Location of Pain Severity and Description of Pain Patient Has Paino No Site Locations Pain Management and Medication Current Pain Management: Electronic Signature(s) Signed: 10/03/2019 3:26:34 PM By: Rodell PernaScott, Dajea [name]: Rodell PernaScott, Dajea on 10/02/2019 09:37:22 Sendejo, [name] ([id number]) -------------------------------------------------------------------------------- Patient/Caregiver Education Details Patient Name: [name], [name] Date of Service: 10/02/2019 9:30 AM Medical Record Number: [id number] Patient Account Number: [id number] Date of Birth/Gender: 12/13/1975 (43 y.o. F) Treating RN: Huel CoventryWoody, Kim Primary Care Physician: Marcelino DusterJOHNSTON, JOHN Other Clinician: Referring Physician: Marcelino DusterJOHNSTON, JOHN Treating Physician/Extender: Altamese CarolinaOBSON, MICHAEL G Weeks in Treatment: 7 Education Assessment Education Provided To: Patient Education Topics Provided Venous: Handouts: Controlling Swelling with Multilayered Compression Wraps Methods: Demonstration, Explain/Verbal Responses: State content correctly Electronic Signature(s) Signed: 10/02/2019 5:21:25 PM By: Elliot GurneyWoody, BSN, RN, CWS, Kim RN, BSN [name]: Elliot GurneyWoody, BSN, RN,  CWS, Kim on 10/02/2019 09:56:54 Fifield, [name] ([id number]) -------------------------------------------------------------------------------- Wound Assessment Details Patient Name: [name], [name] Date of Service: 10/02/2019 9:30 AM Medical Record Number: [id number] Patient Account Number: [id number] Date of Birth/Sex: Apr 13, 1976 (43 y.o. F) Treating RN: Army Melia Primary Care Donyea Gafford: Harrel Lemon Other Clinician: Referring Cerrone Debold: Harrel Lemon Treating Marl Seago/Extender: Tito Dine in  Treatment: 7 Wound Status Wound Number: 1 Primary Etiology: Venous Leg Ulcer Wound Location: Left Lower Leg - Anterior Wound Status: Open Wounding Event: Blister Date Acquired: 05/21/2018 Weeks Of Treatment: 7 Clustered Wound: No Photos Wound Measurements Length: (cm) 0.8 Width: (cm) 0.7 Depth: (cm) 0.1 Area: (cm) 0.44 Volume: (cm) 0.044 % Reduction in Area: 56.9% % Reduction in Volume: 56.9% Epithelialization: Large (67-100%) Tunneling: No Undermining: No Wound Description Classification: Partial Thickness Foul Odor A Wound Margin: Flat and Intact Slough/Fibr Exudate Amount: Small Exudate Type: Serosanguineous Exudate Color: red, brown fter Cleansing: No ino Yes Wound Bed Granulation Amount: Large (67-100%) Exposed Structure Granulation Quality: Red Fascia Exposed: No Necrotic Amount: Small (1-33%) Fat Layer (Subcutaneous Tissue) Exposed: Yes Necrotic Quality: Adherent Slough Tendon Exposed: No Muscle Exposed: No Joint Exposed: No Bone Exposed: No Treatment Notes [name], [name] ([id number]) Wound #1 (Left, Anterior Lower Leg) Notes Prisma Ag, ABD, 3L(L), unna to anchor Electronic Signature(s) Signed: 10/03/2019 3:26:34 PM By: Army Melia [name]: Army Melia on 10/02/2019 09:43:30 [name], [name] ([id number]) -------------------------------------------------------------------------------- Vitals Details Patient Name: [name], [name] Date of Service: 10/02/2019 9:30 AM Medical Record Number: [id number] Patient Account Number: [id number] Date of Birth/Sex: 1976-10-24 (43 y.o. F) Treating RN: Army Melia Primary Care Marlaine Arey: Harrel Lemon Other Clinician: Referring Frankee Gritz: Harrel Lemon Treating Rainn Zupko/Extender: Tito Dine in Treatment: 7 Vital Signs Time Taken: 09:37 Temperature (F): 98.9 Height (in): 60 Pulse (bpm): 70 Weight (lbs): 204 Respiratory Rate (breaths/min): 16 Body Mass Index (BMI): 39.8 Blood Pressure  (mmHg): 130/83 Reference Range: 80 - 120 mg / dl Electronic Signature(s) Signed: 10/03/2019 3:26:34 PM By: Army Melia [name]: Army Melia on 10/02/2019 09:37:59

## 2019-10-09 ENCOUNTER — Encounter: Payer: BC Managed Care – PPO | Admitting: Internal Medicine

## 2019-10-09 ENCOUNTER — Other Ambulatory Visit: Payer: Self-pay

## 2019-10-09 DIAGNOSIS — I87332 Chronic venous hypertension (idiopathic) with ulcer and inflammation of left lower extremity: Secondary | ICD-10-CM | POA: Diagnosis not present

## 2019-10-10 NOTE — Progress Notes (Signed)
[name] ([id number]) Visit Report for 10/09/2019 Arrival Information Details Patient Name: [name], [name]. Date of Service: 10/09/2019 9:30 AM Medical Record Number: [id number] Patient Account Number: [id number] Date of Birth/Sex: 07-12-1976 (43 y.o. F) Treating RN: [name] Primary Care Lidya Mccalister: [name] Other Clinician: Referring [name]: [name] Treating [name]/Extender: [name] in Treatment: 8 Visit Information History Since Last Visit Added or deleted any medications: No Patient Arrived: Ambulatory Any new allergies or adverse reactions: No Arrival Time: 09:44 Had a fall or experienced change in No Accompanied By: sef activities of daily living that may affect Transfer Assistance: None risk of falls: Patient Identification Verified: Yes Signs or symptoms of abuse/neglect since last visito No Patient Has Alerts: Yes Hospitalized since last visit: No Patient Alerts: Patient on Blood Thinner Has Dressing in Place as Prescribed: Yes Xarelto Pain Present Now: No Electronic Signature(s) Signed: 10/09/2019 4:09:40 PM By: [name] [name]: [name] on 10/09/2019 09:44:24 Jerger, [name] ([id number]) -------------------------------------------------------------------------------- Encounter Discharge Information Details Patient Name: [name], [name]. Date of Service: 10/09/2019 9:30 AM Medical Record Number: [id number] Patient Account Number: [id number] Date of Birth/Sex: [birth year] (43 y.o. F) Treating RN: [name] Primary Care Reise Gladney: [name] Other Clinician: Referring Yousef Huge: [name] Treating Felesia Stahlecker/Extender: Amy [place] in Treatment: 8 Encounter Discharge Information Items Discharge Condition: Stable Ambulatory Status: Ambulatory Discharge Destination: Home Transportation: Private Auto Accompanied By: self Schedule Follow-up Appointment: Yes Clinical Summary of Care: Electronic  Signature(s) Signed: 10/10/2019 1:15:06 PM By: [name], BSN, RN, CWS, Kim RN, BSN [name]: [name], BSN, RN, CWS, [name] on 10/09/2019 10:10:21 Tomaselli, [name] ([id number]) -------------------------------------------------------------------------------- Lower Extremity Assessment Details Patient Name: [name], [name]. Date of Service: 10/09/2019 9:30 AM Medical Record Number: [id number] Patient Account Number: [id number] Date of Birth/Sex: 1976/08/11 (43 y.o. F) Treating RN: [name] Primary Care [name]: [name] Other Clinician: Referring [name]: [name] Treating Lidya Mccalister/Extender: Amy [place] in Treatment: 8 Edema Assessment Assessed: [Left: No] [Right: No] Edema: [Left: N] [Right: o] Calf Left: Right: Point of Measurement: 32 cm From Medial Instep 42 cm cm Ankle Left: Right: Point of Measurement: 8 cm From Medial Instep 22 cm cm Vascular Assessment Pulses: Dorsalis Pedis Palpable: [Left:Yes] Electronic Signature(s) Signed: 10/09/2019 4:09:40 PM By: [name] [name]: [name] on 10/09/2019 09:53:36 Decola, [name] ([id number]) -------------------------------------------------------------------------------- Multi Wound Chart Details Patient Name: [name], [name]. Date of Service: 10/09/2019 9:30 AM Medical Record Number: [id number] Patient Account Number: [id number] Date of Birth/Sex: [birth year] (43 y.o. F) Treating RN: [name] Primary Care Keeon Zurn: [name] Other Clinician: Referring [name]: [name] Treating Prapti Grussing/Extender: Amy  in Treatment: 8 Vital Signs Height(in): 60 Pulse(bpm): 54 Weight(lbs): 204 Blood Pressure(mmHg): 136/76 Body Mass Index(BMI): 40 Temperature(F): 98.6 Respiratory Rate 16 (breaths/min): Photos: [N/A:N/A] Wound Location: Left Lower Leg - Anterior N/A N/A Wounding Event: Blister N/A N/A Primary Etiology: Venous Leg Ulcer N/A N/A Date Acquired: 05/21/2018 N/A  N/A Weeks of Treatment: 8 N/A N/A Wound Status: Open N/A N/A Measurements L x W x D 0.8x1x0.1 N/A N/A (cm) Area (cm) : 0.628 N/A N/A Volume (cm) : 0.063 N/A N/A % Reduction in Area: 38.50% N/A N/A % Reduction in Volume: 38.20% N/A N/A Classification: Partial Thickness N/A N/A Exudate Amount: Small N/A N/A Exudate Type: Serosanguineous N/A N/A Exudate Color: red, brown N/A N/A Wound Margin: Flat and Intact N/A N/A Granulation Amount: Large (67-100%) N/A N/A Granulation Quality: Red N/A N/A Necrotic Amount: Small (1-33%) N/A N/A  Exposed Structures: Fat Layer (Subcutaneous N/A N/A Tissue) Exposed: Yes Fascia: No Tendon: No Muscle: No Joint: No Bone: No Epithelialization: Large (67-100%) N/A N/A [name], [name]. ([id number]) Treatment Notes Wound #1 (Left, Anterior Lower Leg) Notes Prisma Ag, ABD, 3L(L), unna to anchor Electronic Signature(s) Signed: 10/09/2019 5:09:09 PM By: Amy Ham MD [name]: [name] on 10/09/2019 11:21:25 [name], [name] ([id number]) -------------------------------------------------------------------------------- [place] Details Patient Name: [name], [name]. Date of Service: 10/09/2019 9:30 AM Medical Record Number: [id number] Patient Account Number: [id number] Date of Birth/Sex: Feb 26, 1976 (43 y.o. F) Treating RN: [name] Primary Care [name]: [name] Other Clinician: Referring [name]: [name] Treating [name]/Extender: [name] in Treatment: 8 Active Inactive Necrotic Tissue Nursing Diagnoses: Impaired tissue integrity related to necrotic/devitalized tissue Goals: Necrotic/devitalized tissue will be minimized in the wound bed Date Initiated: 08/14/2019 Target Resolution Date: 08/21/2019 Goal Status: Active Interventions: Assess patient pain level pre-, during and post procedure and prior to discharge Treatment Activities: Apply topical anesthetic as ordered :  08/14/2019 Notes: Orientation to the Wound Care Program Nursing Diagnoses: Knowledge deficit related to the wound healing center program Goals: Patient/caregiver will verbalize understanding of the [place] Date Initiated: 08/14/2019 Target Resolution Date: 08/21/2019 Goal Status: Active Interventions: Provide education on orientation to the wound center Notes: Wound/Skin Impairment Nursing Diagnoses: Impaired tissue integrity Goals: Ulcer/skin breakdown will have a volume reduction of 30% by week 4 Date Initiated: 08/14/2019 Target Resolution Date: 09/11/2019 Goal Status: Active [name], [name] ([id number]) Interventions: Assess ulceration(s) every visit Treatment Activities: Referred to DME Roddy Bellamy for dressing supplies : 08/14/2019 Notes: Electronic Signature(s) Signed: 10/10/2019 1:15:06 PM By: Gretta Cool, BSN, RN, CWS, Kim RN, BSN [name]: Gretta Cool, BSN, RN, CWS, [name] on 10/09/2019 10:07:05 Troost, [name] ([id number]) -------------------------------------------------------------------------------- Pain Assessment Details Patient Name: [name], [name]. Date of Service: 10/09/2019 9:30 AM Medical Record Number: [id number] Patient Account Number: [id number] Date of Birth/Sex: 1976/05/04 (43 y.o. F) Treating RN: Army Melia Primary Care Isyss Espinal: [name] Other Clinician: Referring Alveena Taira: [name] Treating Keiara Sneeringer/Extender: [name] in Treatment: 8 Active Problems Location of Pain Severity and Description of Pain Patient Has Paino No Site Locations Pain Management and Medication Current Pain Management: Electronic Signature(s) Signed: 10/09/2019 4:09:40 PM By: Army Melia [name]: Army Melia on 10/09/2019 09:44:30 Gavilanes, [name] ([id number]) -------------------------------------------------------------------------------- Patient/Caregiver Education Details Patient Name: [name], [name]. Date of Service:  10/09/2019 9:30 AM Medical Record Number: [id number] Patient Account Number: [id number] Date of Birth/Gender: 09/02/1976 (43 y.o. F) Treating RN: [name] Primary Care Physician: [name] Other Clinician: Referring Physician: Harrel [name] Treating Physician/Extender: [name] in Treatment: 8 Education Assessment Education Provided To: Patient Education Topics Provided Venous: Handouts: Controlling Swelling with Multilayered Compression Wraps Methods: Demonstration, Explain/Verbal Responses: State content correctly Wound/Skin Impairment: Handouts: Caring for Your Ulcer Methods: Demonstration, Explain/Verbal Responses: State content correctly Electronic Signature(s) Signed: 10/10/2019 1:15:06 PM By: Gretta Cool, BSN, RN, CWS, Kim RN, BSN [name]: Gretta Cool, BSN, RN, CWS, [name] on 10/09/2019 10:09:42 [name], [name] ([id number]) -------------------------------------------------------------------------------- Wound Assessment Details Patient Name: [name], [name]. Date of Service: 10/09/2019 9:30 AM Medical Record Number: [id number] Patient Account Number: [id number] Date of Birth/Sex: [birth year] (43 y.o. F) Treating RN: Army Melia Primary Care Kammy Klett: [name] Other Clinician: Referring Jaice Lague: [name] Treating Yon Schiffman/Extender: [name] in Treatment: 8 Wound Status Wound Number: 1 Primary Etiology: Venous Leg Ulcer Wound Location: Left Lower Leg - Anterior Wound Status: Open Wounding Event:  Blister Date Acquired: 05/21/2018 Weeks Of Treatment: 8 Clustered Wound: No Photos Wound Measurements Length: (cm) 0.8 Width: (cm) 1 Depth: (cm) 0.1 Area: (cm) 0.628 Volume: (cm) 0.063 % Reduction in Area: 38.5% % Reduction in Volume: 38.2% Epithelialization: Large (67-100%) Tunneling: No Undermining: No Wound Description Classification: Partial Thickness Foul Odor A Wound Margin: Flat and Intact Slough/Fibr Exudate Amount:  Small Exudate Type: Serosanguineous Exudate Color: red, brown fter Cleansing: No ino Yes Wound Bed Granulation Amount: Large (67-100%) Exposed Structure Granulation Quality: Red Fascia Exposed: No Necrotic Amount: Small (1-33%) Fat Layer (Subcutaneous Tissue) Exposed: Yes Necrotic Quality: Adherent Slough Tendon Exposed: No Muscle Exposed: No Joint Exposed: No Bone Exposed: No Treatment Notes [name], [name]. ([id number]) Wound #1 (Left, Anterior Lower Leg) Notes Prisma Ag, ABD, 3L(L), unna to anchor Electronic Signature(s) Signed: 10/09/2019 4:09:40 PM By: Amy PernaScott, Dajea [name]: Amy PernaScott, Dajea on 10/09/2019 09:53:04 Lajeunesse, Amy ScottGINGER [name]. ([id number]) -------------------------------------------------------------------------------- Vitals Details Patient Name: [name], [name]. Date of Service: 10/09/2019 9:30 AM Medical Record Number: [id number] Patient Account Number: [id number] Date of Birth/Sex: 06/10/1976 (43 y.o. F) Treating RN: Amy PernaScott, Dajea Primary Care Rook Maue: Amy DusterJOHNSTON, JOHN Other Clinician: Referring Pavle Wiler: Amy DusterJOHNSTON, JOHN Treating Britteney Ayotte/Extender: Amy CarolinaOBSON, MICHAEL G Weeks in Treatment: 8 Vital Signs Time Taken: 09:44 Temperature (F): 98.6 Height (in): 60 Pulse (bpm): 54 Weight (lbs): 204 Respiratory Rate (breaths/min): 16 Body Mass Index (BMI): 39.8 Blood Pressure (mmHg): 136/76 Reference Range: 80 - 120 mg / dl Electronic Signature(s) Signed: 10/09/2019 4:09:40 PM By: Amy PernaScott, Dajea [name]: Amy PernaScott, Dajea on 10/09/2019 09:45:43

## 2019-10-10 NOTE — Progress Notes (Signed)
Amy SidleMADDEN, Anye B. ([id number]) Visit Report for 10/09/2019 HPI Details Patient Name: [name], [name] Date of Service: 10/09/2019 9:30 AM Medical Record Number: [id number] Patient Account Number: [id number] Date of Birth/Sex: 01/12/1976 (43 y.o. F) Treating RN: [name] Primary Care Provider: Marcelino DusterJOHNSTON, [name] Other Clinician: Referring Provider: Marcelino DusterJOHNSTON, [name] Treating Provider/Extender: [name] Weeks in Treatment: 8 History of Present Illness HPI Description: ADMISSION 08/14/2019 This is a 43 year old woman who is here for review of a longstanding wound on the left anterior tibial area for perhaps as long as 1 year. 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 She recently had an IVC stent placed. She relates this wound to a complicated admission in June 2019. She was admitted to hospital for a planned hysterectomy she developed acute tubular necrosis postop. . A CT angiogram showed an IVC filter occlusion. [facility]. I did not look out open over these records. Apparently her kidney function recovered. 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 She has not been doing anything to this wound except for episodic Neosporin. She has not been wearing compression. 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 She takes chronic Xarelto. As I understand things she is had a recent IVC stent Past medical history multiple DVTs as noted above on chronic anticoagulation with Xarelto, postoperative acute tubular necrosis I think has completely resolved per her description although I have not verified this. ABIs in our clinic were noncompressible on the left 9/30; wound surface looks better. No mechanical debridement. We have been using Iodoflex under 3 layer compression. 10/7; wound surface had some eschar on the lower part of the circumference and some  debris on the surface. I have been using Iodoflex. [facility] I was able to find arterial studies although they did not look at anything below the knee. She does have a femoral artery stent which was felt at that time to be widely patent. The also looked for a DVT at that time the common femoral femoral veins were fully compressible 10/14; better looking wound surface that is measuring slightly smaller. Using Hydrofera Blue. They corrected me today on the femoral artery stent that I stated on 10/7 and said it was a vein stent tell need to have a better look at her [facility] records. The wound is smaller and looks healthy 10/21; scant amount of denuded skin removed with pickups and scissors. Using Hydrofera Blue. Very healthy looking wound surface 10/28; left lower extremity anterior tibial wound. 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 She has been using Hydrofera Blue under 3 layer compression. Arrives with only a small hyper granulated open area remaining. She has compression stockings that she ordered 11/11; left lower extremity anterior tibial wound. She has been using Hydrofera Blue under 3 layer compression. Arrives today with eschar over the wound. 11/18; left lower extremity anterior tibial wound. She has been using Hydrofera Blue under 3 layer compression. Still with a very small open area. Electronic Signature(s) Signed: 10/09/2019 5:09:09 PM By: Baltazar Najjarobson, Jahbari Repinski MD [name], [name] ([id number]) [name]: Baltazar Najjarobson, Briena Swingler on 10/09/2019 11:22:00 [name], [name] ([id number]) --------------------------------------------------------------------------------  Physical Exam Details Patient Name: [name],  [name] Date of Service: 10/09/2019 9:30 AM Medical Record Number: [id number] Patient Account Number: [id number] Date of Birth/Sex: July 10, 1976 (43 y.o. F) Treating RN: Amy Coventry Primary Care Provider: Marcelino Duster Other Clinician: Referring Provider: Marcelino Duster Treating Provider/Extender: Amy [place] in Treatment: 8 Constitutional Sitting or standing Blood Pressure is within target range for patient.. Pulse regular and within target range for patient.[name] Respirations regular, non-labored and within target range.. Temperature is normal and within the target range for the patient.[name] appears in no distress. Eyes Conjunctivae clear. No discharge. Respiratory Respiratory effort is easy and symmetric bilaterally. Rate is normal at rest and on room air.. Cardiovascular It will pulses are palpable on the left. Integumentary (Hair, Skin) No erythema around the wound. Psychiatric No evidence of depression, anxiety, or agitation. Calm, cooperative, and communicative. Appropriate interactions and affect.. Notes Wound exam; the patient only has a very small open area remains that is a clean surface. There is some surface debris next to this I did not disturb this today we will see how this looks next week. No evidence of surrounding infection Electronic Signature(s) Signed: 10/09/2019 5:09:09 PM By: Baltazar Najjar MD [name]: Baltazar Najjar on 10/09/2019 11:23:21 [name] ([id number]) -------------------------------------------------------------------------------- Physician Orders Details Patient Name: [name], [name] Date of Service: 10/09/2019 9:30 AM Medical Record Number: [id number] Patient Account Number: [id number] Date of Birth/Sex: March 03, 1976 (43 y.o. F) Treating RN: Amy Coventry Primary Care Provider: Marcelino Duster Other Clinician: Referring Provider: Marcelino Duster Treating Provider/Extender: Amy [place] in Treatment: 8 Verbal / Phone  Orders: No Diagnosis Coding Wound Cleansing Wound #1 Left,Anterior Lower Leg o Clean wound with Normal Saline. o May shower with protection. - Cast protector to keep wrap dry Anesthetic (add to Medication List) Wound #1 Left,Anterior Lower Leg o Topical Lidocaine 4% cream applied to wound bed prior to debridement (In Clinic Only). Primary Wound Dressing Wound #1 Left,Anterior Lower Leg o Silver Collagen Secondary Dressing Wound #1 Left,Anterior Lower Leg o ABD pad Dressing Change Frequency Wound #1 Left,Anterior Lower Leg o Change dressing every week Follow-up Appointments Wound #1 Left,Anterior Lower Leg o Return Appointment in 1 week. o Nurse Visit as needed Edema Control Wound #1 Left,Anterior Lower Leg o 3 Layer Compression System - Left Lower Extremity - Unna to anchor Electronic Signature(s) Signed: 10/09/2019 5:09:09 PM By: Baltazar Najjar MD Signed: 10/10/2019 1:15:06 PM By: Elliot Gurney, BSN, RN, CWS, Kim RN, BSN [name]: Elliot Gurney, BSN, RN, CWS, [name] on 10/09/2019 10:08:20 Tousley, Marcellus [name] ([id number]) -------------------------------------------------------------------------------- Problem List Details Patient Name: [name], [name] Date of Service: 10/09/2019 9:30 AM Medical Record Number: [id number] Patient Account Number: [id number] Date of Birth/Sex: 1976/02/25 (43 y.o. 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 Date of Service: 10/09/2019 9:30 AM Medical Record Number: [id number] Patient Account Number: [id number] Date of Birth/Sex: 1976-06-08 (43 y.o. F) Treating RN: Elliot Gurney,  [place] Primary Care Provider: Harrel Lemon Other Clinician: Referring Provider: Harrel Lemon Treating Provider/Extender: Tito Dine in Treatment: 8 Subjective History of Present Illness (HPI) ADMISSION 08/14/2019 This is a 43 year old woman who is here for review of a longstanding wound on the left anterior tibial area for perhaps as long as 1 year. 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 She recently had an IVC stent placed. She relates this wound to a complicated admission in June 2019. She was admitted to hospital for a planned hysterectomy she developed acute tubular necrosis postop. . A CT angiogram showed an IVC filter occlusion. She was transferred to [facility]. I did not look out open over these records. Apparently her kidney function recovered. 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 She has not been doing anything to this wound except for episodic Neosporin. She has not been wearing compression. 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 As I understand things she is had a recent IVC stent Past medical history multiple DVTs as noted above on chronic anticoagulation with Xarelto, postoperative acute tubular necrosis I think has completely resolved per her description although I have not verified this. ABIs in our clinic were noncompressible on the left 9/30; wound  surface looks better. No mechanical debridement. We have been using Iodoflex under 3 layer compression. 10/7; wound surface had some eschar on the lower part of the circumference and some debris on the surface. I have been using Iodoflex. Changed to [facility] today Looking through her records at [facility] I was able to find arterial studies although they did not look at anything below the knee. She does have a femoral artery stent which was felt at that time to be widely patent. The also looked for a DVT at that time the common femoral femoral veins were fully compressible 10/14; better looking wound surface that is measuring slightly smaller. Using Hydrofera Blue. They corrected me today on the femoral artery stent that I stated on 10/7 and said it was a vein stent tell need to have a better look at her [facility] records. The wound is smaller and looks healthy 10/21; scant amount of denuded skin removed with pickups and scissors. Using Hydrofera Blue. Very healthy looking wound surface 10/28; left lower extremity anterior tibial wound. 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 She has been using Hydrofera Blue under 3 layer compression. Arrives with only a small hyper granulated open area remaining. She has compression stockings that she ordered 11/11; left lower extremity anterior tibial wound. She has been using Hydrofera Blue under 3 layer compression. Arrives today with eschar over the wound. 11/18; left lower extremity anterior tibial wound. She has been using Hydrofera Blue under 3 layer compression. Still with a very small open area. Hopfer, [name] Kitchen ([id number]) Objective Constitutional Sitting or standing Blood Pressure is within target range for patient..  Pulse regular and within target range for patient.[name] Respirations regular, non-labored and within target range.. Temperature is normal and within the target range for the patient.[name] appears in no distress. Vitals Time Taken: 9:44 AM, Height: 60 in, Weight: 204 lbs, BMI: 39.8, Temperature: 98.6 F, Pulse:  54 bpm, Respiratory Rate: 16 breaths/min, Blood Pressure: 136/76 mmHg. Eyes Conjunctivae clear. No discharge. Respiratory Respiratory effort is easy and symmetric bilaterally. Rate is normal at rest and on room air.. Cardiovascular It will pulses are palpable on the left. Psychiatric No evidence of depression, anxiety, or agitation. Calm, cooperative, and communicative. Appropriate interactions and affect.. General Notes: Wound exam; the patient only has a very small open area remains that is a clean surface. There is some surface debris next to this I did not disturb this today we will see how this looks next week. No evidence of surrounding infection Integumentary (Hair, Skin) No erythema around the wound. Wound #1 status is Open. Original cause of wound was Blister. The wound is located on the Left,Anterior Lower Leg. The wound measures 0.8cm length x 1cm width x 0.1cm depth; 0.628cm^2 area and 0.063cm^3 volume. There is Fat Layer (Subcutaneous Tissue) Exposed exposed. There is no tunneling or undermining noted. There is a small amount of serosanguineous drainage noted. The wound margin is flat and intact. There is large (67-100%) red granulation within the wound bed. There is a small (1-33%) amount of necrotic tissue within the wound bed including Adherent Slough. 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 ([id number]) Wound #1 Left,Anterior Lower Leg: Clean wound with  Normal Saline. May shower with protection. - Cast protector to keep wrap dry Anesthetic (add to Medication List): Wound #1 Left,Anterior Lower Leg: Topical Lidocaine 4% cream applied to wound bed prior to debridement (In Clinic Only). Primary Wound Dressing: Wound #1 Left,Anterior Lower Leg: Silver Collagen Secondary Dressing: Wound #1 Left,Anterior Lower Leg: ABD pad Dressing Change Frequency: Wound #1 Left,Anterior Lower Leg: Change dressing every week Follow-up Appointments: Wound #1 Left,Anterior Lower Leg: Return Appointment in 1 week. Nurse Visit as needed Edema Control: Wound #1 Left,Anterior Lower Leg: 3 Layer Compression System - Left Lower Extremity - Unna to anchor 1. We use silver collagen under 3 layer compression 2. Hopefully the small open area will close over by the next time she is here. There is some debris medially to the open area. I did not debride this today Electronic Signature(s) Signed: 10/09/2019 5:09:09 PM By: Baltazar Najjar MD [name]: Baltazar Najjar on 10/09/2019 11:24:11 Buttacavoli, Marcellus [name] ([id number]) -------------------------------------------------------------------------------- SuperBill Details Patient Name: Bruney, Kana B. Date of Service: 10/09/2019 Medical Record Number: [id number] Patient Account Number: [id number] Date of Birth/Sex: Feb 29, 1976 (43 y.o. 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

## 2019-10-16 ENCOUNTER — Other Ambulatory Visit: Payer: Self-pay

## 2019-10-16 ENCOUNTER — Encounter: Payer: BC Managed Care – PPO | Admitting: Physician Assistant

## 2019-10-16 DIAGNOSIS — I87332 Chronic venous hypertension (idiopathic) with ulcer and inflammation of left lower extremity: Secondary | ICD-10-CM | POA: Diagnosis not present

## 2019-10-17 NOTE — Progress Notes (Signed)
[name], [name] ([id number]) Visit Report for 10/16/2019 Arrival Information Details Patient Name: [name], [name] Date of Service: 10/16/2019 2:00 PM Medical Record Number: [id number] Patient Account Number: [id number] Date of Birth/Sex: 12/08/1975 ([age] y.o. F) Treating RN: Curtis Sitesorthy, Joanna Primary Care Briggs Edelen: Marcelino DusterJOHNSTON, JOHN Other Clinician: Referring Jaamal Farooqui: Marcelino DusterJOHNSTON, JOHN Treating Shaw Dobek/Extender: Linwood DibblesSTONE III, HOYT Weeks in Treatment: 9 Visit Information History Since Last Visit Added or deleted any medications: No Patient Arrived: Ambulatory Any new allergies or adverse reactions: No Arrival Time: 14:02 Had a fall or experienced change in No Accompanied By: self activities of daily living that may affect Transfer Assistance: None risk of falls: Patient Identification Verified: Yes Signs or symptoms of abuse/neglect since last visito No Secondary Verification Process Yes Hospitalized since last visit: No Completed: Implantable device outside of the clinic excluding No Patient Has Alerts: Yes cellular tissue based products placed in the center Patient Alerts: Patient on Blood since last visit: Thinner Has Dressing in Place as Prescribed: Yes Xarelto Has Compression in Place as Prescribed: Yes Pain Present Now: No Electronic Signature(s) Signed: 10/16/2019 4:08:09 PM By: Curtis Sitesorthy, Joanna [name]: Curtis Sitesorthy, Joanna on 10/16/2019 14:02:52 [name], [name] ([id number]) -------------------------------------------------------------------------------- Clinic Level of Care Assessment Details Patient Name: [name], [name] Date of Service: 10/16/2019 2:00 PM Medical Record Number: [id number] Patient Account Number: [id number] Date of Birth/Sex: 06/13/1976 (43 y.o. F) Treating RN: Huel CoventryWoody, Kim Primary Care Sidra Oldfield: Marcelino DusterJOHNSTON, JOHN Other Clinician: Referring Violette Morneault: Marcelino DusterJOHNSTON, JOHN Treating Jayel Inks/Extender: Linwood DibblesSTONE III, HOYT Weeks in Treatment: 9 Clinic Level of Care  Assessment Items TOOL 4 Quantity Score []  - Use when only an EandM is performed on FOLLOW-UP visit 0 ASSESSMENTS - Nursing Assessment / Reassessment []  - Reassessment of Co-morbidities (includes updates in patient status) 0 X- 1 5 Reassessment of Adherence to Treatment Plan ASSESSMENTS - Wound and Skin Assessment / Reassessment X - Simple Wound Assessment / Reassessment - one wound 1 5 []  - 0 Complex Wound Assessment / Reassessment - multiple wounds []  - 0 Dermatologic / Skin Assessment (not related to wound area) ASSESSMENTS - Focused Assessment []  - Circumferential Edema Measurements - multi extremities 0 []  - 0 Nutritional Assessment / Counseling / Intervention []  - 0 Lower Extremity Assessment (monofilament, tuning fork, pulses) []  - 0 Peripheral Arterial Disease Assessment (using hand held doppler) ASSESSMENTS - Ostomy and/or Continence Assessment and Care []  - Incontinence Assessment and Management 0 []  - 0 Ostomy Care Assessment and Management (repouching, etc.) PROCESS - Coordination of Care X - Simple Patient / Family Education for ongoing care 1 15 []  - 0 Complex (extensive) Patient / Family Education for ongoing care []  - 0 Staff obtains ChiropractorConsents, Records, Test Results / Process Orders []  - 0 Staff telephones HHA, Nursing Homes / Clarify orders / etc []  - 0 Routine Transfer to another Facility (non-emergent condition) []  - 0 Routine Hospital Admission (non-emergent condition) []  - 0 New Admissions / Manufacturing engineernsurance Authorizations / Ordering NPWT, Apligraf, etc. []  - 0 Emergency Hospital Admission (emergent condition) X- 1 10 Simple Discharge Coordination [name], [name] ([id number]) []  - 0 Complex (extensive) Discharge Coordination PROCESS - Special Needs []  - Pediatric / Minor Patient Management 0 []  - 0 Isolation Patient Management []  - 0 Hearing / Language / Visual special needs []  - 0 Assessment of Community assistance (transportation, D/C planning,  etc.) []  - 0 Additional assistance / Altered mentation []  - 0 Support Surface(s) Assessment (bed, cushion, seat, etc.) INTERVENTIONS - Wound Cleansing / Measurement X - Simple Wound Cleansing - one  wound 1 5  - 0 Complex Wound Cleansing - multiple wounds X- 1 5 Wound Imaging (photographs - any number of wounds)  - 0 Wound Tracing (instead of photographs) X- 1 5 Simple Wound Measurement - one wound  - 0 Complex Wound Measurement - multiple wounds INTERVENTIONS - Wound Dressings X - Small Wound Dressing one or multiple wounds 1 10  - 0 Medium Wound Dressing one or multiple wounds  - 0 Large Wound Dressing one or multiple wounds  - 0 Application of Medications - topical  - 0 Application of Medications - injection INTERVENTIONS - Miscellaneous  - External ear exam 0  - 0 Specimen Collection (cultures, biopsies, blood, body fluids, etc.)  - 0 Specimen(s) / Culture(s) sent or taken to Lab for analysis  - 0 Patient Transfer (multiple staff / Nurse, adult / Similar devices)  - 0 Simple Staple / Suture removal (25 or less)  - 0 Complex Staple / Suture removal (26 or more)  - 0 Hypo / Hyperglycemic Management (close monitor of Blood Glucose)  - 0 Ankle / Brachial Index (ABI) - do not check if billed separately X- 1 5 Vital Signs [name], [name] ([id number]) Has the patient been seen at the hospital within the last three years: Yes Total Score: 65 Level Of Care: New/Established - Level 2 Electronic Signature(s) Signed: 10/16/2019 5:12:33 PM By: Elliot Gurney, BSN, RN, CWS, Kim RN, BSN [name]: Elliot Gurney, BSN, RN, CWS, Kim on 10/16/2019 14:43:56 [name] ([id number]) -------------------------------------------------------------------------------- Encounter Discharge Information Details Patient Name: Pakula, [name] Date of Service: 10/16/2019 2:00 PM Medical Record Number: [id number] Patient Account Number: [id number] Date of Birth/Sex:  Feb 20, 1976 (43 y.o. F) Treating RN: Huel Coventry Primary Care Kylinn Shropshire: Marcelino Duster Other Clinician: Referring Bow Buntyn: Marcelino Duster Treating Jomar Denz/Extender: Linwood Dibbles, HOYT Weeks in Treatment: 9 Encounter Discharge Information Items Discharge Condition: Stable Ambulatory Status: Ambulatory Discharge Destination: Home Transportation: Private Auto Accompanied By: son Schedule Follow-up Appointment: Yes Clinical Summary of Care: Electronic Signature(s) Signed: 10/16/2019 5:12:33 PM By: Elliot Gurney, BSN, RN, CWS, Kim RN, BSN [name]: Elliot Gurney, BSN, RN, CWS, Kim on 10/16/2019 14:45:14 Dollard, [name] ([id number]) -------------------------------------------------------------------------------- Lower Extremity Assessment Details Patient Name: Theil, [name] Date of Service: 10/16/2019 2:00 PM Medical Record Number: [id number] Patient Account Number: [id number] Date of Birth/Sex: January 29, 1976 (43 y.o. F) Treating RN: Curtis Sites Primary Care Lillah Standre: Marcelino Duster Other Clinician: Referring Zeffie Bickert: Marcelino Duster Treating Pailynn Vahey/Extender: Linwood Dibbles, HOYT Weeks in Treatment: 9 Edema Assessment Assessed: [Left: No] [Right: No] Edema: [Left: Ye] [Right: s] Calf Left: Right: Point of Measurement: 32 cm From Medial Instep 41.5 cm cm Ankle Left: Right: Point of Measurement: 8 cm From Medial Instep 22.5 cm cm Vascular Assessment Pulses: Dorsalis Pedis Palpable: [Left:Yes] Electronic Signature(s) Signed: 10/16/2019 4:08:09 PM By: Curtis Sites [name]: Curtis Sites on 10/16/2019 14:08:59 Arenas, [name] ([id number]) -------------------------------------------------------------------------------- Multi Wound Chart Details Patient Name: Mcdill, [name] Date of Service: 10/16/2019 2:00 PM Medical Record Number: [id number] Patient Account Number: [id number] Date of Birth/Sex: 02-06-1976 (43 y.o. F) Treating RN: Huel Coventry Primary Care Axiel Fjeld: Marcelino Duster Other  Clinician: Referring Francine Hannan: Marcelino Duster Treating Yuji Walth/Extender: Linwood Dibbles, HOYT Weeks in Treatment: 9 Vital Signs Height(in): 60 Pulse(bpm): 72 Weight(lbs): 204 Blood Pressure(mmHg): 117/77 Body Mass Index(BMI): 40 Temperature(F): 97.8 Respiratory Rate 16 (breaths/min): Photos: [N/A:N/A] Wound Location: Left Lower Leg - Anterior N/A N/A Wounding Event: Blister N/A N/A Primary Etiology: Venous Leg Ulcer N/A N/A Date Acquired: 05/21/2018 N/A N/A Weeks of Treatment: 9 N/A N/A  Wound Status: Open N/A N/A Measurements L x W x D 0.1x0.1x0.1 N/A N/A (cm) Area (cm) : 0.008 N/A N/A Volume (cm) : 0.001 N/A N/A % Reduction in Area: 99.20% N/A N/A % Reduction in Volume: 99.00% N/A N/A Classification: Partial Thickness N/A N/A Exudate Amount: Small N/A N/A Exudate Type: Serosanguineous N/A N/A Exudate Color: red, brown N/A N/A Wound Margin: Flat and Intact N/A N/A Granulation Amount: Large (67-100%) N/A N/A Granulation Quality: Red N/A N/A Necrotic Amount: None Present (0%) N/A N/A Exposed Structures: Fat Layer (Subcutaneous N/A N/A Tissue) Exposed: Yes Fascia: No Tendon: No Muscle: No Joint: No Bone: No Epithelialization: Large (67-100%) N/A N/A Archuletta, [name] ([id number]) Treatment Notes Electronic Signature(s) Signed: 10/16/2019 5:12:33 PM By: Gretta Cool, BSN, RN, CWS, Kim RN, BSN [name]: Gretta Cool, BSN, RN, CWS, Kim on 10/16/2019 14:39:10 Onstad, [name] ([id number]) -------------------------------------------------------------------------------- Avery Details Patient Name: Smyth, [name] Date of Service: 10/16/2019 2:00 PM Medical Record Number: [id number] Patient Account Number: [id number] Date of Birth/Sex: [birth year] (43 y.o. F) Treating RN: Cornell Barman Primary Care Valeree Leidy: Harrel Lemon Other Clinician: Referring Danyela Posas: Harrel Lemon Treating Adham Johnson/Extender: Melburn Hake, HOYT Weeks in Treatment: 9 Active Inactive Necrotic  Tissue Nursing Diagnoses: Impaired tissue integrity related to necrotic/devitalized tissue Goals: Necrotic/devitalized tissue will be minimized in the wound bed Date Initiated: 08/14/2019 Target Resolution Date: 08/21/2019 Goal Status: Active Interventions: Assess patient pain level pre-, during and post procedure and prior to discharge Treatment Activities: Apply topical anesthetic as ordered : 08/14/2019 Notes: Orientation to the Wound Care Program Nursing Diagnoses: Knowledge deficit related to the wound healing center program Goals: Patient/caregiver will verbalize understanding of the [facility] Date Initiated: 08/14/2019 Target Resolution Date: 08/21/2019 Goal Status: Active Interventions: Provide education on orientation to the wound center Notes: Wound/Skin Impairment Nursing Diagnoses: Impaired tissue integrity Goals: Ulcer/skin breakdown will have a volume reduction of 30% by week 4 Date Initiated: 08/14/2019 Target Resolution Date: 09/11/2019 Goal Status: Active Christopher, [name] ([id number]) Interventions: Assess ulceration(s) every visit Treatment Activities: Referred to DME Taronda Comacho for dressing supplies : 08/14/2019 Notes: Electronic Signature(s) Signed: 10/16/2019 5:12:33 PM By: Gretta Cool, BSN, RN, CWS, Kim RN, BSN [name]: Gretta Cool, BSN, RN, CWS, Kim on 10/16/2019 14:38:54 Cliff, [name] ([id number]) -------------------------------------------------------------------------------- Pain Assessment Details Patient Name: Nies, Mireya B. Date of Service: 10/16/2019 2:00 PM Medical Record Number: [id number] Patient Account Number: [id number] Date of Birth/Sex: [birth year] (43 y.o. F) Treating RN: Montey Hora Primary Care Xander Jutras: Harrel Lemon Other Clinician: Referring Shakeisha Horine: Harrel Lemon Treating Maurie Musco/Extender: Melburn Hake, HOYT Weeks in Treatment: 9 Active Problems Location of Pain Severity and Description of Pain Patient Has  Paino No Site Locations Pain Management and Medication Current Pain Management: Electronic Signature(s) Signed: 10/16/2019 4:08:09 PM By: Montey Hora [name]: Montey Hora on 10/16/2019 14:02:57 Lockyer, [name] ([id number]) -------------------------------------------------------------------------------- Patient/Caregiver Education Details Patient Name: Avalos, [name] Date of Service: 10/16/2019 2:00 PM Medical Record Number: [id number] Patient Account Number: [id number] Date of Birth/Gender: [birth year] (43 y.o. F) Treating RN: Cornell Barman Primary Care Physician: Harrel Lemon Other Clinician: Referring Physician: Harrel Lemon Treating Physician/Extender: Sharalyn Ink in Treatment: 9 Education Assessment Education Provided To: Patient Education Topics Provided Wound/Skin Impairment: Handouts: Caring for Your Ulcer Methods: Demonstration, Explain/Verbal Responses: State content correctly Electronic Signature(s) Signed: 10/16/2019 5:12:33 PM By: Gretta Cool, BSN, RN, CWS, Kim RN, BSN [name]: Gretta Cool, BSN, RN, CWS, Kim on 10/16/2019 14:44:16 Valliant, [name] ([id number]) -------------------------------------------------------------------------------- Wound Assessment Details Patient Name: Somero, [name] Date of  Service: 10/16/2019 2:00 PM Medical Record Number: [id number] Patient Account Number: [id number] Date of Birth/Sex: [birth year] (43 y.o. F) Treating RN: Curtis Sites Primary Care Ranisha Allaire: Marcelino Duster Other Clinician: Referring Evangelina Delancey: Marcelino Duster Treating Levaeh Vice/Extender: Linwood Dibbles, HOYT Weeks in Treatment: 9 Wound Status Wound Number: 1 Primary Etiology: Venous Leg Ulcer Wound Location: Left Lower Leg - Anterior Wound Status: Open Wounding Event: Blister Date Acquired: 05/21/2018 Weeks Of Treatment: 9 Clustered Wound: No Photos Wound Measurements Length: (cm) 0.1 % Reduction i Width: (cm) 0.1 % Reduction i Depth: (cm) 0.1  Epithelializa Area: (cm) 0.008 Tunneling: Volume: (cm) 0.001 Undermining: n Area: 99.2% n Volume: 99% tion: Large (67-100%) No No Wound Description Classification: Partial Thickness Foul Odor Af Wound Margin: Flat and Intact Slough/Fibri Exudate Amount: Small Exudate Type: Serosanguineous Exudate Color: red, brown ter Cleansing: No no Yes Wound Bed Granulation Amount: Large (67-100%) Exposed Structure Granulation Quality: Red Fascia Exposed: No Necrotic Amount: None Present (0%) Fat Layer (Subcutaneous Tissue) Exposed: Yes Tendon Exposed: No Muscle Exposed: No Joint Exposed: No Bone Exposed: No Treatment Notes Baez, [name] ([id number]) Wound #1 (Left, Anterior Lower Leg) Notes Bordered Foam, compression stocking Electronic Signature(s) Signed: 10/16/2019 4:08:09 PM By: Curtis Sites [name]: Curtis Sites on 10/16/2019 14:10:01 Doverspike, [name] ([id number]) -------------------------------------------------------------------------------- Vitals Details Patient Name: Curci, Delories B. Date of Service: 10/16/2019 2:00 PM Medical Record Number: [id number] Patient Account Number: [id number] Date of Birth/Sex: 17-Jun-1976 (43 y.o. F) Treating RN: Curtis Sites Primary Care Falan Hensler: Marcelino Duster Other Clinician: Referring Tyvion Edmondson: Marcelino Duster Treating Luccas Towell/Extender: Linwood Dibbles, HOYT Weeks in Treatment: 9 Vital Signs Time Taken: 14:03 Temperature (F): 97.8 Height (in): 60 Pulse (bpm): 72 Weight (lbs): 204 Respiratory Rate (breaths/min): 16 Body Mass Index (BMI): 39.8 Blood Pressure (mmHg): 117/77 Reference Range: 80 - 120 mg / dl Electronic Signature(s) Signed: 10/16/2019 4:08:09 PM By: Curtis Sites [name]: Curtis Sites on 10/16/2019 14:03:49

## 2019-10-17 NOTE — Progress Notes (Signed)
[name], Jandy B. ([id number]) Visit Report for 10/16/2019 Chief Complaint Document Details Patient Name: [name], [name] Date of Service: 10/16/2019 2:00 PM Medical Record Number: [id number] Patient Account Number: [id number] Date of Birth/Sex: 03/16/1976 (43 y.o. F) Treating RN: Huel CoventryWoody, Kim Primary Care Provider: Marcelino DusterJOHNSTON, JOHN Other Clinician: Referring Provider: Marcelino DusterJOHNSTON, JOHN Treating Provider/Extender: Linwood DibblesSTONE III, HOYT Weeks in Treatment: 9 Information Obtained from: Patient Chief Complaint 08/14/2019; the patient is here for review of a wound on her left anterior mid tibia that is been present for 1 year Electronic Signature(s) Signed: 10/16/2019 4:47:05 PM By: Lenda KelpStone III, Hoyt PA-C [name]: Lenda KelpStone III, Hoyt on 10/16/2019 13:57:37 Swint, [name] ([id number]) -------------------------------------------------------------------------------- HPI Details Patient Name: Mcqueen, Leoda B. Date of Service: 10/16/2019 2:00 PM Medical Record Number: [id number] Patient Account Number: [id number] Date of Birth/Sex: 01/26/1976 (43 y.o. F) Treating RN: Huel CoventryWoody, Kim Primary Care Provider: Marcelino DusterJOHNSTON, JOHN Other Clinician: Referring Provider: Marcelino DusterJOHNSTON, JOHN Treating Provider/Extender: Linwood DibblesSTONE III, HOYT Weeks in Treatment: 9 History of Present Illness HPI Description: ADMISSION 08/14/2019 This is a 43 year old woman who is here for review of a longstanding wound on the left anterior tibial area for perhaps as long as 1 year. 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 She recently had an IVC stent placed. She relates this wound to a complicated admission in June 2019. She was admitted to hospital for a planned hysterectomy she developed acute tubular necrosis postop. . A CT angiogram showed an IVC filter occlusion. [facility] I did not look out open over these  records. Apparently her kidney function recovered. 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 She has not been doing anything to this wound except for episodic Neosporin. She has not been wearing compression. 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 She takes chronic Xarelto. As I understand things she is had a recent IVC stent Past medical history multiple DVTs as noted above on chronic anticoagulation with Xarelto, postoperative acute tubular necrosis I think has completely resolved per her description although I have not verified this. ABIs in our clinic were noncompressible on the left 9/30; wound surface looks better. No mechanical debridement. We have been using Iodoflex under 3 layer compression. 10/7; wound surface had some eschar on the lower part of the circumference and some debris on the surface. I have been using Iodoflex. [facility] I was able to find arterial studies although they did not look at anything below the knee. She does have a femoral artery stent which was felt at that time to be widely patent. The also looked for a DVT at that time the common femoral femoral veins were fully compressible 10/14; better looking wound surface that is measuring slightly smaller. Using Hydrofera Blue. They corrected me today on the femoral artery stent that I stated on 10/7 and said it was a vein stent tell need to have a better look at her [facility] records. The wound is smaller and looks healthy 10/21; scant amount of denuded skin removed with pickups and scissors. Using Hydrofera Blue. Very healthy looking wound surface 10/28; left lower extremity anterior tibial wound. 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 11/4 left lower extremity anterior tibial wound secondary to severe  venous hypertension from  central venous disease. She has been using Hydrofera Blue under 3 layer compression. Arrives with only a small hyper granulated open area remaining. She has compression stockings that she ordered 11/11; left lower extremity anterior tibial wound. She has been using Hydrofera Blue under 3 layer compression. Arrives today with eschar over the wound. 11/18; left lower extremity anterior tibial wound. She has been using Hydrofera Blue under 3 layer compression. Still with a very small open area. 10/16/2019 upon evaluation today patient actually appears to be doing very well with regard to her left lower extremity ulcer. In fact this appears to be mostly healed although it does appear that her dressing, Prisma, unfortunately stuck somewhat to the wound at this point based on what we are seeing. Fortunately there is no signs of active infection at this time but unfortunately I do believe that she is having some issues when it comes down to the collagen and the fact that this is draining so little that things are getting very stuck to her wound bed. Electronic Signature(s) Murton, MYCAH MCDOUGALL ([id number]) Signed: 10/16/2019 4:47:05 PM By: Lenda Kelp PA-C [name]: Lenda Kelp on 10/16/2019 14:57:45 Shidler, [name] ([id number]) -------------------------------------------------------------------------------- Physical Exam Details Patient Name: [name], [name] Date of Service: 10/16/2019 2:00 PM Medical Record Number: [id number] Patient Account Number: [id number] Date of Birth/Sex: November 02, 1976 (43 y.o. F) Treating RN: Huel Coventry Primary Care Provider: Marcelino Duster Other Clinician: Referring Provider: Marcelino Duster Treating Provider/Extender: Linwood Dibbles, HOYT Weeks in Treatment: 9 Constitutional Well-nourished and well-hydrated in no acute distress. Respiratory normal breathing without difficulty. clear to auscultation bilaterally. Cardiovascular regular rate and rhythm with normal S1,  S2. Psychiatric this patient is able to make decisions and demonstrates good insight into disease process. Alert and Oriented x 3. pleasant and cooperative. Notes His wound bed barely shows anything open at this time which is good news. In fact I think that this is draining so little that even the collagen is getting stuck despite having a significant amount of hydrogel placed to the collagen last week to try to prevent this from occurring. With that being said I do believe that the patient would likely do well with just a nonadherent bandage such as a next care bandage which hopefully would not cause any irritation to her skin either and then subsequently just see if that will help this to close up over the next week using her own compression stockings. Electronic Signature(s) Signed: 10/16/2019 4:47:05 PM By: Lenda Kelp PA-C [name]: Lenda Kelp on 10/16/2019 14:58:29 Demuro, [name] ([id number]) -------------------------------------------------------------------------------- Physician Orders Details Patient Name: Hannen, Jama B. Date of Service: 10/16/2019 2:00 PM Medical Record Number: [id number] Patient Account Number: [id number] Date of Birth/Sex: [birth year] (43 y.o. 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 Edema Control Wound #1 Left,Anterior Lower Leg o Patient to wear own compression stockings Electronic Signature(s) Signed: 10/16/2019 4:47:05 PM By:  Lenda Kelp PA-C Signed: 10/16/2019 5:12:33 PM By: Elliot Gurney, BSN, RN, CWS, Kim RN, BSN [name]: Elliot Gurney, BSN, RN, CWS, Kim on 10/16/2019 14:43:09 Hillery, [name] ([id number]) -------------------------------------------------------------------------------- Problem List Details Patient Name: Quiggle, Jamesa B. Date of Service: 10/16/2019 2:00 PM Medical Record Number: [id number] Patient Account Number: [id number] Date of Birth/Sex: May 05, 1976 (43 y.o. 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 Date of Service: 10/16/2019 2:00 PM Medical Record Number: [id number] Patient Account Number: [id number] Date of Birth/Sex: Mar 23, 1976 (43 y.o. F) Treating RN: Huel Coventry Primary Care Provider: Marcelino Duster Other Clinician: Referring Provider: Marcelino Duster Treating Provider/Extender: Linwood Dibbles, HOYT Weeks in Treatment: 9 Subjective Chief Complaint Information obtained from Patient 08/14/2019; the patient is  here for review of a wound on her left anterior mid tibia that is been present for 1 year History of Present Illness (HPI) ADMISSION 08/14/2019 This is a 43 year old woman who is here for review of a longstanding wound on the left anterior tibial area for perhaps as long as 1 year. 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 She recently had an IVC stent placed. She relates this wound to a complicated admission in June 2019. She was admitted to hospital for a planned hysterectomy she developed acute tubular necrosis postop. . A CT angiogram showed an IVC filter occlusion. She was transferred to [facility]. I did not look out open over these records. Apparently her kidney function recovered. 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 She has not been doing anything to this wound except for episodic Neosporin. She has not been wearing compression. 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 As I understand things she is had a recent IVC stent Past medical history multiple DVTs as noted above on chronic anticoagulation with Xarelto, postoperative acute tubular necrosis I think has completely resolved per her description although I have not verified this. ABIs in our clinic were noncompressible on the left 9/30; wound surface looks better. No mechanical debridement. We have been using Iodoflex under 3 layer compression. 10/7; wound surface had some eschar on the lower part of the circumference and some debris on the surface. I have been using Iodoflex. Changed to [facility] today Looking through her records at [facility] I was able to find arterial studies although they did not look at anything below the knee. She does have a femoral artery stent which was felt at that time to be widely patent. The also looked for a DVT at that time the common femoral  femoral veins were fully compressible 10/14; better looking wound surface that is measuring slightly smaller. Using Hydrofera Blue. They corrected me today on the femoral artery stent that I stated on 10/7 and said it was a  vein stent tell need to have a better look at her [facility] records. The wound is smaller and looks healthy 10/21; scant amount of denuded skin removed with pickups and scissors. Using Hydrofera Blue. Very healthy looking wound surface 10/28; left lower extremity anterior tibial wound. 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 She has been using Hydrofera Blue under 3 layer compression. Arrives with only a small hyper granulated open area remaining. She has compression stockings that she ordered 11/11; left lower extremity anterior tibial wound. She has been using Hydrofera Blue under 3 layer compression. Arrives today with eschar over the wound. 11/18; left lower extremity anterior tibial wound. She has been using Hydrofera Blue under 3 layer compression. Still with a very small open area. 10/16/2019 upon evaluation today patient actually appears to be doing very well with regard to her left lower extremity ulcer. In fact this appears to be mostly healed although it does appear that her dressing, Prisma, unfortunately stuck somewhat to Brevik, Mona B. ([id number]) the wound at this point based on what we are seeing. Fortunately there is no signs of active infection at this time but unfortunately I do believe that she is having some issues when it comes down to the collagen and the fact that this is draining so little that things are getting very stuck to her wound bed. Patient History Information obtained from Patient. Family History Cancer -  Maternal Grandparents, Diabetes - Mother, Heart Disease - Maternal Grandparents, Seizures - Maternal Grandparents, No family history of Hereditary Spherocytosis, Hypertension, Kidney Disease, Lung Disease, Stroke, Thyroid Problems, Tuberculosis. Social History Never smoker, Marital Status - Married, Alcohol Use - Moderate, Drug Use - No History, Caffeine Use - Daily. Medical History Ear/Nose/Mouth/Throat Denies history of Chronic sinus problems/congestion, Middle ear problems Hematologic/Lymphatic Denies history of Anemia, Hemophilia, Human Immunodeficiency Virus, Lymphedema, Sickle Cell Disease Respiratory Denies history of Aspiration, Asthma, Chronic Obstructive Pulmonary Disease (COPD), Pneumothorax, Sleep Apnea, Tuberculosis Cardiovascular Denies history of Angina, Arrhythmia, Congestive Heart Failure, Coronary Artery Disease, Deep Vein Thrombosis, Hypertension, Hypotension, Myocardial Infarction, Peripheral Arterial Disease, Peripheral Venous Disease, Phlebitis, Vasculitis Gastrointestinal Denies history of Cirrhosis , Colitis, Crohn s, Hepatitis A, Hepatitis B, Hepatitis C Endocrine Denies history of Type I Diabetes, Type II Diabetes Genitourinary Denies history of End Stage Renal Disease Immunological Denies history of Lupus Erythematosus, Raynaud s, Scleroderma Integumentary (Skin) Denies history of History of Burn, History of pressure wounds Musculoskeletal Denies history of Gout, Rheumatoid Arthritis, Osteoarthritis, Osteomyelitis Neurologic Denies history of Dementia, Neuropathy, Quadriplegia, Paraplegia, Seizure Disorder Oncologic Denies history of Received Chemotherapy, Received Radiation Psychiatric Denies history of Anorexia/bulimia, Confinement Anxiety Review of Systems (ROS) Constitutional Symptoms (General Health) Denies complaints or symptoms of Fatigue, Fever, Chills, Marked Weight Change. Respiratory Denies complaints or symptoms of Chronic or frequent  coughs, Shortness of Breath. Cardiovascular Complains or has symptoms of LE edema. Denies complaints or symptoms of Chest pain. Psychiatric Denies complaints or symptoms of Anxiety, Claustrophobia. Jarnagin, [name] ([id number]) Objective Constitutional Well-nourished and well-hydrated in no acute distress. Vitals Time Taken: 2:03 PM, Height: 60 in, Weight: 204 lbs, BMI: 39.8, Temperature: 97.8 F, Pulse: 72 bpm, Respiratory Rate: 16 breaths/min, Blood Pressure: 117/77 mmHg. Respiratory normal breathing without difficulty. clear to auscultation bilaterally. Cardiovascular regular rate and rhythm with normal S1, S2. Psychiatric this patient is able to make decisions  and demonstrates good insight into disease process. Alert and Oriented x 3. pleasant and cooperative. General Notes: His wound bed barely shows anything open at this time which is good news. In fact I think that this is draining so little that even the collagen is getting stuck despite having a significant amount of hydrogel placed to the collagen last week to try to prevent this from occurring. With that being said I do believe that the patient would likely do well with just a nonadherent bandage such as a next care bandage which hopefully would not cause any irritation to her skin either and then subsequently just see if that will help this to close up over the next week using her own compression stockings. Integumentary (Hair, Skin) Wound #1 status is Open. Original cause of wound was Blister. The wound is located on the Left,Anterior Lower Leg. The wound measures 0.1cm length x 0.1cm width x 0.1cm depth; 0.008cm^2 area and 0.001cm^3 volume. There is Fat Layer (Subcutaneous Tissue) Exposed exposed. There is no tunneling or undermining noted. There is a small amount of serosanguineous drainage noted. The wound margin is flat and intact. There is large (67-100%) red granulation within the wound bed. There is no necrotic  tissue within the wound bed. 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 ([id number]) Primary Wound Dressing: Wound #1 Left,Anterior Lower Leg: Boardered Foam Dressing Dressing Change Frequency: Wound #1 Left,Anterior Lower Leg: Other: - as needed Follow-up Appointments: Wound #1 Left,Anterior Lower Leg: Return Appointment in 1 week. Edema Control: Wound #1 Left,Anterior Lower Leg: Patient to wear own compression stockings 1 I would recommend currently that we go ahead and initiate treatment with the next care waterproof bandages she can purchase over-the-counter I did show her which ones to get I like her to get the knee and elbow size. 2. I would recommend as well she use her own compression stockings to keep the edema under control I still think that is probably the best option for her at this point based on what I am seeing. The patient is in agreement with that plan. 3. We will have her come back for 1 more visit next week hopefully that will be the last 1 where she will be completely healed at that time assuming everything does well and she has no complications between then and now. If she develops any erythema, redness, or irritation where the adhesive is around the wound she knows to discontinue using that and then she will let us know and in the meantime discussed a nonadherent pad underneath her compression slid down into the front of that area. We will see patient back for reevaluation in 1 week here in the clinic. If anything worsens or changes patient will contact our office for additional recommendations. Electronic Signature(s) Signed: 10/16/2019 4:47:05 PM By: Worthy Keeler PA-C [name]: Worthy Keeler on 10/16/2019 14:59:50 Musich, [name]  ([id number]) -------------------------------------------------------------------------------- ROS/PFSH Details Patient Name: Haggart, Zaidy B. Date of Service: 10/16/2019 2:00 PM Medical Record Number: [id number] Patient Account Number: [id number] Date of Birth/Sex: [birth year] (43 y.o. F) Treating RN: Cornell Barman Primary Care Provider: Harrel Lemon Other Clinician: Referring Provider: Harrel Lemon Treating Provider/Extender: Melburn Hake, HOYT Weeks in Treatment: 9 Information Obtained From Patient Constitutional Symptoms (General Health) Complaints and Symptoms: Negative for: Fatigue; Fever; Chills; Marked Weight Change Respiratory Complaints  and Symptoms: Negative for: Chronic or frequent coughs; Shortness of Breath Medical History: Negative for: Aspiration; Asthma; Chronic Obstructive Pulmonary Disease (COPD); Pneumothorax; Sleep Apnea; Tuberculosis Cardiovascular Complaints and Symptoms: Positive for: LE edema Negative for: Chest pain Medical History: Negative for: Angina; Arrhythmia; Congestive Heart Failure; Coronary Artery Disease; Deep Vein Thrombosis; Hypertension; Hypotension; Myocardial Infarction; Peripheral Arterial Disease; Peripheral Venous Disease; Phlebitis; Vasculitis Psychiatric Complaints and Symptoms: Negative for: Anxiety; Claustrophobia Medical History: Negative for: Anorexia/bulimia; Confinement Anxiety Ear/Nose/Mouth/Throat Medical History: Negative for: Chronic sinus problems/congestion; Middle ear problems Hematologic/Lymphatic Medical History: Negative for: Anemia; Hemophilia; Human Immunodeficiency Virus; Lymphedema; Sickle Cell Disease Gastrointestinal Medical History: Negative for: Cirrhosis ; Colitis; Crohnos; Hepatitis A; Hepatitis B; Hepatitis C Lyssy, Shahidah B. ([id number]) Endocrine Medical History: Negative for: Type I Diabetes; Type II Diabetes Genitourinary Medical History: Negative for: End Stage Renal  Disease Immunological Medical History: Negative for: Lupus Erythematosus; Raynaudos; Scleroderma Integumentary (Skin) Medical History: Negative for: History of Burn; History of pressure wounds Musculoskeletal Medical History: Negative for: Gout; Rheumatoid Arthritis; Osteoarthritis; Osteomyelitis Neurologic Medical History: Negative for: Dementia; Neuropathy; Quadriplegia; Paraplegia; Seizure Disorder Oncologic Medical History: Negative for: Received Chemotherapy; Received Radiation Immunizations Pneumococcal Vaccine: Received Pneumococcal Vaccination: No Implantable Devices None Family and Social History Cancer: Yes - Maternal Grandparents; Diabetes: Yes - Mother; Heart Disease: Yes - Maternal Grandparents; Hereditary Spherocytosis: No; Hypertension: No; Kidney Disease: No; Lung Disease: No; Seizures: Yes - Maternal Grandparents; Stroke: No; Thyroid Problems: No; Tuberculosis: No; Never smoker; Marital Status - Married; Alcohol Use: Moderate; Drug Use: No History; Caffeine Use: Daily; Financial Concerns: No; Food, Clothing or Shelter Needs: No; Support System Lacking: No; Transportation Concerns: No Physician Affirmation I have reviewed and agree with the above information. Electronic Signature(s) Signed: 10/16/2019 4:47:05 PM By: Lenda Kelp PA-C Signed: 10/16/2019 5:12:33 PM By: Elliot Gurney, BSN, RN, CWS, Kim RN, BSN [name]: Lenda Kelp on 10/16/2019 14:58:11 Hampe, [name] ([id number]) Mannan, [name] ([id number]) -------------------------------------------------------------------------------- SuperBill Details Patient Name: Balash, Carroll B. Date of Service: 10/16/2019 Medical Record Number: [id number] Patient Account Number: [id number] Date of Birth/Sex: [birth year] (43 y.o. 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

## 2019-10-23 ENCOUNTER — Other Ambulatory Visit: Payer: Self-pay

## 2019-10-23 ENCOUNTER — Encounter: Payer: BC Managed Care – PPO | Attending: Internal Medicine | Admitting: Internal Medicine

## 2019-10-23 DIAGNOSIS — I87312 Chronic venous hypertension (idiopathic) with ulcer of left lower extremity: Secondary | ICD-10-CM | POA: Diagnosis not present

## 2019-10-23 DIAGNOSIS — D6869 Other thrombophilia: Secondary | ICD-10-CM | POA: Insufficient documentation

## 2019-10-23 DIAGNOSIS — L97821 Non-pressure chronic ulcer of other part of left lower leg limited to breakdown of skin: Secondary | ICD-10-CM | POA: Diagnosis present

## 2019-10-23 DIAGNOSIS — Z7901 Long term (current) use of anticoagulants: Secondary | ICD-10-CM | POA: Diagnosis not present

## 2019-10-23 NOTE — Progress Notes (Signed)
SELETA, HOVLAND ([id number]) Visit Report for 10/23/2019 Arrival Information Details Patient Name: [name], [name] Date of Service: 10/23/2019 9:30 AM Medical Record Number: [id number] Patient Account Number: [id number] Date of Birth/Sex: September 17, 1976 (43 y.o. F) Treating RN: Rodell Perna Primary Care Lilyona Richner: Marcelino Duster Other Clinician: Referring Christianna Belmonte: Marcelino Duster Treating Evalynne Locurto/Extender: Altamese [place] in Treatment: 10 Visit Information History Since Last Visit Added or deleted any medications: No Patient Arrived: Ambulatory Any new allergies or adverse reactions: No Arrival Time: 09:52 Had a fall or experienced change in No Accompanied By: self activities of daily living that may affect Transfer Assistance: None risk of falls: Patient Identification Verified: Yes Signs or symptoms of abuse/neglect since last visito No Patient Has Alerts: Yes Hospitalized since last visit: No Patient Alerts: Patient on Blood Thinner Has Dressing in Place as Prescribed: Yes Xarelto Pain Present Now: No Electronic Signature(s) Signed: 10/23/2019 11:52:14 AM By: Rodell Perna [name]: Rodell Perna on 10/23/2019 09:52:48 [name] ([id number]) -------------------------------------------------------------------------------- Clinic Level of Care Assessment Details Patient Name: [name], [name] Date of Service: 10/23/2019 9:30 AM Medical Record Number: [id number] Patient Account Number: [id number] Date of Birth/Sex: 1976/07/05 (43 y.o. F) Treating RN: Arnette Norris Primary Care Radie Berges: Marcelino Duster Other Clinician: Referring Shawnee Higham: Marcelino Duster Treating Nikash Mortensen/Extender: Altamese [place] in Treatment: 10 Clinic Level of Care Assessment Items TOOL 4 Quantity Score  - Use when only an EandM is performed on FOLLOW-UP visit 0 ASSESSMENTS - Nursing Assessment / Reassessment X - Reassessment of Co-morbidities (includes updates in patient status) 1  10 X- 1 5 Reassessment of Adherence to Treatment Plan ASSESSMENTS - Wound and Skin Assessment / Reassessment X - Simple Wound Assessment / Reassessment - one wound 1 5  - 0 Complex Wound Assessment / Reassessment - multiple wounds  - 0 Dermatologic / Skin Assessment (not related to wound area) ASSESSMENTS - Focused Assessment  - Circumferential Edema Measurements - multi extremities 0  - 0 Nutritional Assessment / Counseling / Intervention  - 0 Lower Extremity Assessment (monofilament, tuning fork, pulses)  - 0 Peripheral Arterial Disease Assessment (using hand held doppler) ASSESSMENTS - Ostomy and/or Continence Assessment and Care  - Incontinence Assessment and Management 0  - 0 Ostomy Care Assessment and Management (repouching, etc.) PROCESS - Coordination of Care X - Simple Patient / Family Education for ongoing care 1 15  - 0 Complex (extensive) Patient / Family Education for ongoing care  - 0 Staff obtains Chiropractor, Records, Test Results / Process Orders  - 0 Staff telephones HHA, Nursing Homes / Clarify orders / etc  - 0 Routine Transfer to another Facility (non-emergent condition)  - 0 Routine Hospital Admission (non-emergent condition)  - 0 New Admissions / Manufacturing engineer / Ordering NPWT, Apligraf, etc.  - 0 Emergency Hospital Admission (emergent condition) X- 1 10 Simple Discharge Coordination Bello, [name] ([id number])  - 0 Complex (extensive) Discharge Coordination PROCESS - Special Needs  - Pediatric / Minor Patient Management 0  - 0 Isolation Patient Management  - 0 Hearing / Language / Visual special needs  - 0 Assessment of Community assistance (transportation, D/C planning, etc.)  - 0 Additional assistance / Altered mentation  - 0 Support Surface(s) Assessment (bed, cushion, seat, etc.) INTERVENTIONS - Wound Cleansing / Measurement X - Simple Wound Cleansing - one wound 1 5  -  0 Complex Wound Cleansing - multiple wounds X- 1 5 Wound Imaging (photographs - any number of wounds)  - 0 Wound Tracing (instead of  photographs) X- 1 5 Simple Wound Measurement - one wound []  - 0 Complex Wound Measurement - multiple wounds INTERVENTIONS - Wound Dressings X - Small Wound Dressing one or multiple wounds 1 10 []  - 0 Medium Wound Dressing one or multiple wounds []  - 0 Large Wound Dressing one or multiple wounds []  - 0 Application of Medications - topical []  - 0 Application of Medications - injection INTERVENTIONS - Miscellaneous []  - External ear exam 0 []  - 0 Specimen Collection (cultures, biopsies, blood, body fluids, etc.) []  - 0 Specimen(s) / Culture(s) sent or taken to Lab for analysis []  - 0 Patient Transfer (multiple staff / Civil Service fast streamer / Similar devices) []  - 0 Simple Staple / Suture removal (25 or less) []  - 0 Complex Staple / Suture removal (26 or more) []  - 0 Hypo / Hyperglycemic Management (close monitor of Blood Glucose) []  - 0 Ankle / Brachial Index (ABI) - do not check if billed separately X- 1 5 Vital Signs [name], [name] ([id number]) Has the patient been seen at the hospital within the last three years: Yes Total Score: 75 Level Of Care: New/Established - Level 2 Electronic Signature(s) Signed: 10/23/2019 4:10:47 PM By: Harold Barban [name]: Harold Barban on 10/23/2019 10:14:26 [name] ([id number]) -------------------------------------------------------------------------------- Encounter Discharge Information Details Patient Name: [name], [name] Date of Service: 10/23/2019 9:30 AM Medical Record Number: [id number] Patient Account Number: [id number] Date of Birth/Sex: 07-18-1976 (43 y.o. F) Treating RN: Harold Barban Primary Care Marshawn Ninneman: Harrel Lemon Other Clinician: Referring Tallin Hart: Harrel Lemon Treating Rachael Zapanta/Extender: Tito Dine in Treatment: 10 Encounter Discharge Information  Items Discharge Condition: Stable Ambulatory Status: Ambulatory Discharge Destination: Home Transportation: Private Auto Accompanied By: self Schedule Follow-up Appointment: Yes Clinical Summary of Care: Electronic Signature(s) Signed: 10/23/2019 4:10:47 PM By: Harold Barban [name]: Harold Barban on 10/23/2019 10:15:58 [name] ([id number]) -------------------------------------------------------------------------------- Lower Extremity Assessment Details Patient Name: [name], [name] Date of Service: 10/23/2019 9:30 AM Medical Record Number: [id number] Patient Account Number: [id number] Date of Birth/Sex: [birth year] (43 y.o. F) Treating RN: Army Melia Primary Care Duvan Mousel: Harrel Lemon Other Clinician: Referring Sharif Rendell: Harrel Lemon Treating Addysen Louth/Extender: Ricard Dillon Weeks in Treatment: 10 Edema Assessment Assessed: [Left: No] [Right: No] Edema: [Left: N] [Right: o] Vascular Assessment Pulses: Dorsalis Pedis Palpable: [Left:Yes] Electronic Signature(s) Signed: 10/23/2019 11:52:14 AM By: Army Melia [name]: Army Melia on 10/23/2019 09:55:37 [name], [name] ([id number]) -------------------------------------------------------------------------------- Multi Wound Chart Details Patient Name: [name], [name] Date of Service: 10/23/2019 9:30 AM Medical Record Number: [id number] Patient Account Number: [id number] Date of Birth/Sex: [birth year] (43 y.o. F) Treating RN: Harold Barban Primary Care Jaidah Lomax: Harrel Lemon Other Clinician: Referring Oryn Casanova: Harrel Lemon Treating Elener Custodio/Extender: Tito Dine in Treatment: 10 Vital Signs Height(in): 60 Pulse(bpm): 55 Weight(lbs): 204 Blood Pressure(mmHg): 130/65 Body Mass Index(BMI): 40 Temperature(F): 98.9 Respiratory Rate 16 (breaths/min): Photos: [N/A:N/A] Wound Location: Left, Anterior Lower Leg N/A N/A Wounding Event: Blister N/A N/A Primary Etiology: Venous  Leg Ulcer N/A N/A Date Acquired: 05/21/2018 N/A N/A Weeks of Treatment: 10 N/A N/A Wound Status: Healed - Epithelialized N/A N/A Measurements L x W x D 0x0x0 N/A N/A (cm) Area (cm) : 0 N/A N/A Volume (cm) : 0 N/A N/A % Reduction in Area: 100.00% N/A N/A % Reduction in Volume: 100.00% N/A N/A Classification: Partial Thickness N/A N/A Exudate Amount: Small N/A N/A Exudate Type: Serosanguineous N/A N/A Exudate Color: red, brown N/A N/A Wound Margin: Flat and Intact N/A N/A Granulation Amount: None Present (  0%) N/A N/A Necrotic Amount: None Present (0%) N/A N/A Exposed Structures: Fascia: No N/A N/A Fat Layer (Subcutaneous Tissue) Exposed: No Tendon: No Muscle: No Joint: No Bone: No Epithelialization: Large (67-100%) N/A N/A Treatment Notes Chagnon, SIMRANJIT THAYER ([id number]) Electronic Signature(s) Signed: 10/23/2019 4:48:24 PM By: Baltazar Najjar MD [name]: Baltazar Najjar on 10/23/2019 12:04:06 Corbello, Marcellus [name] ([id number]) -------------------------------------------------------------------------------- Multi-Disciplinary Care Plan Details Patient Name: Crean, Bianey B. Date of Service: 10/23/2019 9:30 AM Medical Record Number: [id number] Patient Account Number: [id number] Date of Birth/Sex: [birth year] (43 y.o. F) Treating RN: Arnette Norris Primary Care Tywan Siever: Marcelino Duster Other Clinician: Referring Kissie Ziolkowski: Marcelino Duster Treating Armonee Bojanowski/Extender: Altamese Mathews in Treatment: 10 Active Inactive Electronic Signature(s) Signed: 10/23/2019 4:10:47 PM By: Arnette Norris [name]: Arnette Norris on 10/23/2019 10:13:06 Wire, Marcellus [name] ([id number]) -------------------------------------------------------------------------------- Pain Assessment Details Patient Name: [name], [name] Date of Service: 10/23/2019 9:30 AM Medical Record Number: [id number] Patient Account Number: [id number] Date of Birth/Sex: 1976-10-23 (43 y.o. F) Treating RN: Rodell Perna Primary Care Sarabeth Benton: Marcelino Duster Other Clinician: Referring Harvie Morua: Marcelino Duster Treating Lundon Verdejo/Extender: Altamese Stigler in Treatment: 10 Active Problems Location of Pain Severity and Description of Pain Patient Has Paino No Site Locations Pain Management and Medication Current Pain Management: Electronic Signature(s) Signed: 10/23/2019 11:52:14 AM By: Rodell Perna [name]: Rodell Perna on 10/23/2019 09:52:59 Dassow, Marcellus [name] ([id number]) -------------------------------------------------------------------------------- Patient/Caregiver Education Details Patient Name: [name], [name] Date of Service: 10/23/2019 9:30 AM Medical Record Number: [id number] Patient Account Number: [id number] Date of Birth/Gender: [birth year] (43 y.o. F) Treating RN: Arnette Norris Primary Care Physician: Marcelino Duster Other Clinician: Referring Physician: Marcelino Duster Treating Physician/Extender: Altamese Murdock in Treatment: 10 Education Assessment Education Provided To: Patient Education Topics Provided Venous: Handouts: Controlling Swelling with Compression Stockings Methods: Demonstration, Explain/Verbal Responses: State content correctly Electronic Signature(s) Signed: 10/23/2019 4:10:47 PM By: Arnette Norris [name]: Arnette Norris on 10/23/2019 10:13:40 Fulwider, Marcellus [name] ([id number]) -------------------------------------------------------------------------------- Wound Assessment Details Patient Name: Mayorquin, [name] B. Date of Service: 10/23/2019 9:30 AM Medical Record Number: [id number] Patient Account Number: [id number] Date of Birth/Sex: 03/15/1976 (44 y.o. F) Treating RN: Arnette Norris Primary Care Vernis Eid: Marcelino Duster Other Clinician: Referring Donya Tomaro: Marcelino Duster Treating Charli Liberatore/Extender: Altamese [place] in Treatment: 10 Wound Status Wound Number: 1 Primary Etiology: Venous Leg Ulcer Wound Location: Left, Anterior  Lower Leg Wound Status: Healed - Epithelialized Wounding Event: Blister Date Acquired: 05/21/2018 Weeks Of Treatment: 10 Clustered Wound: No Photos Wound Measurements Length: (cm) 0 % Reduct Width: (cm) 0 % Reduct Depth: (cm) 0 Epitheli Area: (cm) 0 Volume: (cm) 0 ion in Area: 100% ion in Volume: 100% alization: Large (67-100%) Wound Description Classification: Partial Thickness Foul Odo Wound Margin: Flat and Intact Slough/F Exudate Amount: Small Exudate Type: Serosanguineous Exudate Color: red, brown r After Cleansing: No ibrino Yes Wound Bed Granulation Amount: None Present (0%) Exposed Structure Necrotic Amount: None Present (0%) Fascia Exposed: No Fat Layer (Subcutaneous Tissue) Exposed: No Tendon Exposed: No Muscle Exposed: No Joint Exposed: No Bone Exposed: No Electronic Signature(s) Gang, SIGRID SCHWEBACH ([id number]) Signed: 10/23/2019 4:10:47 PM By: Arnette Norris [name]: Arnette Norris on 10/23/2019 10:12:38 Simar, Marcellus [name] ([id number]) -------------------------------------------------------------------------------- Vitals Details Patient Name: Faulkner, Staria B. Date of Service: 10/23/2019 9:30 AM Medical Record Number: [id number] Patient Account Number: [id number] Date of Birth/Sex: [birth year] (43 y.o. F) Treating RN: Rodell Perna Primary Care Reyhan Moronta: Marcelino Duster Other Clinician: Referring Dylana Shaw: Marcelino Duster Treating Montay Vanvoorhis/Extender: Altamese Hagerman in Treatment: 10 Vital Signs  Time Taken: 09:53 Temperature (F): 98.9 Height (in): 60 Pulse (bpm): 55 Weight (lbs): 204 Respiratory Rate (breaths/min): 16 Body Mass Index (BMI): 39.8 Blood Pressure (mmHg): 130/65 Reference Range: 80 - 120 mg / dl Electronic Signature(s) Signed: 10/23/2019 11:52:14 AM By: Rodell PernaScott, Dajea [name]: Rodell PernaScott, Dajea on 10/23/2019 09:53:13

## 2019-10-23 NOTE — Progress Notes (Signed)
Amy, [name] ([id number]) Visit Report for 10/23/2019 HPI Details Patient Name: [name], [name] Date of Service: 10/23/2019 9:30 AM Medical Record Number: [id number] Patient Account Number: [id number] Date of Birth/Sex: 03/23/1976 (43 y.o. F) Treating RN: Arnette Norris Primary Care Provider: Marcelino Duster Other Clinician: Referring Provider: Marcelino Duster Treating Provider/Extender: Altamese [place] in Treatment: 10 History of Present Illness HPI Description: ADMISSION 08/14/2019 This is a 43 year old woman who is here for review of a longstanding wound on the left anterior tibial area for perhaps as long as 1 year. 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 She recently had an IVC stent placed. She relates this wound to a complicated admission in June 2019. She was admitted to hospital for a planned hysterectomy she developed acute tubular necrosis postop. . A CT angiogram showed an IVC filter occlusion. She was transferred to [facility]. I did not look out open over these records. Apparently her kidney function recovered. 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 She has not been doing anything to this wound except for episodic Neosporin. She has not been wearing compression. 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 As I understand things she is had a recent IVC stent Past medical history multiple DVTs as noted above on chronic anticoagulation with Xarelto, postoperative acute tubular necrosis I think has completely resolved per her description although I have not verified this. ABIs in our clinic were noncompressible on the left 9/30; wound surface looks better. No mechanical debridement. We have been using Iodoflex under 3 layer compression. 10/7; wound surface had some eschar on the lower part of the circumference and  some debris on the surface. I have been using Iodoflex. Changed to [facility] today Looking through her records at [facility] I was able to find arterial studies although they did not look at anything below the knee. She does have a femoral artery stent which was felt at that time to be widely patent. The also looked for a DVT at that time the common femoral femoral veins were fully compressible 10/14; better looking wound surface that is measuring slightly smaller. Using Hydrofera Blue. They corrected me today on the femoral artery stent that I stated on 10/7 and said it was a vein stent tell need to have a better look at her [facility] records. The wound is smaller and looks healthy 10/21; scant amount of denuded skin removed with pickups and scissors. Using Hydrofera Blue. Very healthy looking wound surface 10/28; left lower extremity anterior tibial wound. 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 She has been using Hydrofera Blue under 3 layer compression. Arrives with only a small hyper granulated open area remaining. She has compression stockings that she ordered 11/11; left lower extremity anterior tibial wound. She has been using Hydrofera Blue under 3 layer compression. Arrives today with eschar over the wound. 11/18; left lower extremity anterior tibial wound. She has been using Hydrofera Blue under 3 layer compression. Still with a very small open area. 10/16/2019 upon evaluation today patient actually appears to be doing very well with regard to her left lower extremity ulcer. In fact this appears to be  mostly healed although it does appear that her dressing, Prisma, unfortunately stuck somewhat to the wound at this point based on what we are seeing.  Fortunately there is no signs of active infection at this time but unfortunately I do believe that she is having some issues when it comes down to the collagen and the fact that this is draining [name], [name] ([id number]) so little that things are getting very stuck to her wound bed. 12/2; left lower extremity anterior tibial wound. She went back into her own stocking last week. Fortunately things have closed over. She still has a fair amount of nonpitting edema. Skin changes anteriorly suggestive of chronic skin damage from venous insufficiency. He has an IVC stent. The exact status and follow up of this is at [facility]. Electronic Signature(s) Signed: 10/23/2019 4:48:24 PM By: Amy Najjar MD [name]: [name] on 10/23/2019 12:05:44 [name], [name] ([id number]) -------------------------------------------------------------------------------- Physical Exam Details Patient Name: Amy, [name] Date of Service: 10/23/2019 9:30 AM Medical Record Number: [id number] Patient Account Number: [id number] Date of Birth/Sex: 06/01/1976 (43 y.o. F) Treating RN: Arnette Norris Primary Care Provider: Marcelino Duster Other Clinician: Referring Provider: Marcelino Duster Treating Provider/Extender: Altamese Griffin in Treatment: 10 Constitutional Sitting or standing Blood Pressure is within target range for patient.. Pulse regular and within target range for patient.[name] Respirations regular, non-labored and within target range.. Temperature is normal and within the target range for the patient.[name] appears in no distress. Eyes Conjunctivae clear. No discharge. Respiratory Respiratory effort is easy and symmetric bilaterally. Rate is normal at rest and on room air.. Cardiovascular Pedal pulses are palpable. She still has significant edema but no open wound. The edema itself is nonpitting. Integumentary (Hair, Skin) Skin changes on the left anterior tibial area of chronic venous  insufficiency very adherent skin in this area where the wound was.. Notes Wound exam; there is no open wound here. However there is skin changes associated with chronic venous insufficiency and fibrosis. 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 Date of Service: 10/23/2019 9:30 AM Medical Record Number: [id number] Patient Account Number: [id number] Date of Birth/Sex: [birth year] (43 y.o. F) Treating RN: Arnette Norris Primary Care Provider: Marcelino Duster Other Clinician: Referring Provider: Marcelino Duster Treating Provider/Extender: Altamese [place] in Treatment: 10 Verbal / Phone Orders: No Diagnosis Coding Discharge From [facility] Services o Discharge from Wound Care Center - Please wear stockings daily, apply a good cream to both legs in the evening. Call with any questions. Thank you Electronic Signature(s) Signed: 10/23/2019 4:10:47 PM By: Arnette Norris Signed: 10/23/2019 4:48:24 PM By: Amy Najjar MD [name]: Arnette Norris on 10/23/2019 10:15:29 Brimage, [name] ([id number]) -------------------------------------------------------------------------------- Problem List Details Patient Name: [name], [name] Date of Service: 10/23/2019 9:30 AM Medical Record Number: [id number] Patient Account Number: [id number] Date of Birth/Sex: Sep 15, 1976 (43 y.o. 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 Date of Service: 10/23/2019 9:30 AM Medical Record Number: [id number] Patient Account Number: [id number] Date of Birth/Sex: 19-May-1976 (43 y.o. F) Treating RN: Harold Barban Primary Care Provider: Harrel Lemon Other Clinician: Referring Provider: Harrel Lemon Treating Provider/Extender: Tito Dine in Treatment: 10 Subjective History of Present Illness (HPI) ADMISSION 08/14/2019 This is a 43 year old woman who is here for review of a longstanding wound on the left anterior tibial area for perhaps as long as 1 year. 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 She recently had an IVC stent placed. She relates this wound to a complicated admission in June 2019. She was admitted to hospital for a planned hysterectomy she developed acute tubular necrosis postop. . A CT angiogram showed an IVC filter occlusion. She was transferred to [facility]. I did not look out open over these records. Apparently her kidney function recovered. 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 She has not been doing anything to this wound except for episodic Neosporin. She has not been wearing compression. 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 As I  understand things she is had a recent IVC stent Past medical history multiple DVTs as noted above on chronic anticoagulation with Xarelto, postoperative acute tubular necrosis I think has completely resolved per her description although I have not verified this. ABIs in our clinic were noncompressible on the left 9/30; wound surface looks better. No mechanical debridement. We have been using Iodoflex under 3 layer compression. 10/7; wound surface had some eschar on the lower part of the circumference and some debris on the surface. I have been using Iodoflex. Changed to [facility] today Looking through her records at [facility] I was able to find arterial studies although they did not look at anything below the knee. She does have a femoral artery stent which was felt at that time to be widely patent. The also looked for a DVT at that time the common femoral femoral veins were fully compressible 10/14; better looking wound surface that is measuring slightly smaller. Using Hydrofera Blue. They corrected me today on the femoral artery stent that I stated on 10/7 and said it was a vein stent tell need to have a better look at her [place] records. The wound is smaller and looks healthy 10/21; scant amount of denuded skin removed with pickups and scissors. Using Hydrofera Blue. Very healthy looking wound surface 10/28; left lower extremity anterior tibial wound. 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 She has been using Hydrofera Blue under 3 layer compression. Arrives with only a small hyper granulated open area remaining. She has compression stockings that she ordered 11/11; left lower extremity anterior tibial wound. She has been using Hydrofera Blue under 3 layer  compression. Arrives today with eschar over the wound. 11/18; left lower extremity anterior tibial wound. She has been using Hydrofera Blue under 3 layer compression. Still with a very small open area. 10/16/2019 upon evaluation today patient actually  appears to be doing very well with regard to her left lower extremity ulcer. In fact this appears to be mostly healed although it does appear that her dressing, Prisma, unfortunately stuck somewhat to the wound at this point based on what we are seeing. Fortunately there is no signs of active infection at this time but unfortunately I do believe that she is having some issues when it comes down to the collagen and the fact that this is draining so little that things are getting very stuck to her wound bed. 12/2; left lower extremity anterior tibial wound. She went back into her own stocking last week. Fortunately things have closed over. She still has a fair amount of nonpitting edema. Skin changes anteriorly suggestive of chronic skin damage from venous [name], [name] ([id number]) insufficiency. He has an IVC stent. The exact status and follow up of this is at [facility]. Objective Constitutional Sitting or standing Blood Pressure is within target range for patient.. Pulse regular and within target range for patient.[name] Respirations regular, non-labored and within target range.. Temperature is normal and within the target range for the patient.[name] appears in no distress. Vitals Time Taken: 9:53 AM, Height: 60 in, Weight: 204 lbs, BMI: 39.8, Temperature: 98.9 F, Pulse: 55 bpm, Respiratory Rate: 16 breaths/min, Blood Pressure: 130/65 mmHg. Eyes Conjunctivae clear. No discharge. Respiratory Respiratory effort is easy and symmetric bilaterally. Rate is normal at rest and on room air.. Cardiovascular Pedal pulses are palpable. She still has significant edema but no open wound. The edema itself is nonpitting. General Notes: Wound exam; there is no open  wound here. However there is skin changes associated with chronic venous insufficiency and fibrosis. Her edema control with her 20/30 below-knee stockings she got in [place] is mediocre Integumentary (Hair, Skin) Skin changes on the left anterior tibial area of chronic venous insufficiency very adherent skin in this area where the wound was.. Wound #1 status is Healed - Epithelialized. Original cause of wound was Blister. The wound is located on the Left,Anterior Lower Leg. The wound measures 0cm length x 0cm width x 0cm depth; 0cm^2 area and 0cm^3 volume. There is a small amount of serosanguineous drainage noted. The wound margin is flat and intact. There is no granulation within the wound bed. There is no necrotic tissue within the wound bed. 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 ([id number]) Plan Discharge From [facility] Services: Discharge from Wound Care Center - Please wear stockings daily, apply a good cream to both legs in the evening. Call with any questions. Thank you 1. The patient to be discharged from the wound care center to her own stockings. 2. Caution to lubricate the skin especially in the anterior tibial area on the left nightly and as needed 3. If she returns to clinic in short order she will need probably more aggressive compression such as an external compression garment. I am not aware of if she has had follow-up with regards to her central venous issues at [facility] this would also need to be researched Electronic Signature(s) Signed: 10/23/2019 4:48:24 PM By: Amy Najjar MD [name]: [name] on 10/23/2019 12:12:32 [name], [name] ([id number]) -------------------------------------------------------------------------------- SuperBill Details Patient Name: [name],  [name] Date of Service: 10/23/2019 Medical Record Number: [id number] Patient Account Number: [id number] Date of Birth/Sex: Mar 25, 1976 (43 y.o. 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 Facility Procedures CPT4 Code: [id number] Description: [phone] - WOUND CARE VISIT-LEV 2 EST PT Modifier: Quantity: 1 Physician Procedures CPT4 Code Description: [id number] - WC PHYS LEVEL 3 - EST PT ICD-10 Diagnosis Description L97.821 Non-pressure chronic ulcer of other part of left lower leg limi I87.312 Chronic venous hypertension (idiopathic) with ulcer of left low D68.69 Other  thrombophilia Modifier: ted to breakdown er extremity Quantity: 1 of skin Electronic Signature(s) Signed: 10/23/2019 4:48:24 PM By: Amy Najjarobson, Lathyn Griggs MD [name]: Amy Najjarobson, Analyce Tavares on 10/23/2019 12:12:46

## 2019-11-26 ENCOUNTER — Ambulatory Visit: Payer: BC Managed Care – PPO | Attending: Internal Medicine

## 2019-11-26 DIAGNOSIS — Z20822 Contact with and (suspected) exposure to covid-19: Secondary | ICD-10-CM

## 2019-11-28 ENCOUNTER — Telehealth: Payer: Self-pay | Admitting: *Deleted

## 2019-11-28 LAB — NOVEL CORONAVIRUS, NAA: SARS-CoV-2, NAA: NOT DETECTED

## 2019-11-28 NOTE — Telephone Encounter (Signed)
Patient called given negative covid results . 

## 2023-01-16 ENCOUNTER — Encounter: Payer: Self-pay | Admitting: Emergency Medicine

## 2023-01-16 ENCOUNTER — Ambulatory Visit
Admission: EM | Admit: 2023-01-16 | Discharge: 2023-01-16 | Disposition: A | Discharge status: Home / Self Care | Payer: BC Managed Care – PPO

## 2023-01-16 DIAGNOSIS — K219 Gastro-esophageal reflux disease without esophagitis: Secondary | ICD-10-CM

## 2023-01-16 MED ORDER — OMEPRAZOLE 20 MG PO CPDR: 20.0000 mg | DELAYED_RELEASE_CAPSULE | Freq: Every day | ORAL | 0 refills | Status: AC

## 2023-01-16 MED ORDER — ALUMINUM-MAGNESIUM-SIMETHICONE 200-200-20 MG/5ML PO SUSP: 30.0000 mL | Freq: Three times a day (TID) | ORAL | 0 refills | Status: AC

## 2023-01-16 NOTE — ED Triage Notes (Signed)
Pt states for several months off and on she has abdominal pain that radiates around her back and up bilateral arms. She notices it after eating certain foods such as dairy and greasy foods. Her abdomen feels distended and it feels like gas as well.

## 2023-01-16 NOTE — ED Provider Notes (Signed)
[facility]    CSN: [id number] Arrival date & time: 01/16/23  1817      History   Chief Complaint Chief Complaint  Patient presents with   Abdominal Pain    HPI [name] is a 47 y.o. female.   Patient presents for evaluation of intermittent upper abdominal pain radiating to the back for 6 months.  Endorses symptoms flared overnight causing interference with sleep.  This occurs sporadically, but can last 30 minutes to hours before resolution.  Taking omeprazole 20 mg daily for GERD.  Attempted Gas-X, Pepto-Bismol and Tylenol last night, minimally helpful.  Endorses symptoms can be triggered by foods such as dairy products or fried foods.  Typically symptoms are worsened overnight but can improve with sitting up, with belching or passing gas.  History of a hysterectomy.  All  Past Medical History:  Diagnosis Date   DVT (deep venous thrombosis) ([place]) 1992   3 blood clots after pregnancy (one in each leg and one in abdomen)   GERD (gastroesophageal reflux disease)     Patient Active Problem List   Diagnosis Date Noted   Adjustment disorder with mixed anxiety and depressed mood 05/08/2018   S/P abdominal hysterectomy 04/27/2018    Past Surgical History:  Procedure Laterality Date   CESAREAN SECTION  2000   CYSTOSCOPY N/A 04/27/2018   Procedure: CYSTOSCOPY;  Surgeon: Benjaman Kindler, MD;  Location: [facility];  Service: Gynecology;  Laterality: N/A;   CYSTOSCOPY W/ RETROGRADES Bilateral 05/18/2018   Procedure: CYSTOSCOPY WITH RETROGRADE PYELOGRAM;  Surgeon: Abbie Sons, MD;  Location: [facility];  Service: Urology;  Laterality: Bilateral;   CYSTOSCOPY W/ URETERAL STENT PLACEMENT Bilateral 04/28/2018   Procedure: CYSTOSCOPY WITH RETROGRADE PYELOGRAM/Left URETERAL STENT PLACEMENT, ureteroscopy;  Surgeon: Lucas Mallow, MD;  Location: [facility];  Service: Urology;  Laterality: Bilateral;   CYSTOSCOPY W/ URETERAL STENT PLACEMENT Left 05/18/2018   Procedure:  CYSTOSCOPY WITH STENT REPLACEMENT;  Surgeon: Abbie Sons, MD;  Location: [facility];  Service: Urology;  Laterality: Left;   CYSTOSCOPY WITH STENT PLACEMENT Right 05/18/2018   Procedure: CYSTOSCOPY WITH STENT PLACEMENT;  Surgeon: Abbie Sons, MD;  Location: [facility];  Service: Urology;  Laterality: Right;   IR NEPHROSTOGRAM RIGHT THRU EXISTING ACCESS  05/10/2018   IR NEPHROSTOMY PLACEMENT RIGHT  04/30/2018   LAPAROSCOPIC LYSIS OF ADHESIONS  04/27/2018   Procedure: LAPAROSCOPIC LYSIS OF ADHESIONS, RETROPERITONEAL DISSECTION;  Surgeon: Benjaman Kindler, MD;  Location: [facility];  Service: Gynecology;;   SALPINGOOPHORECTOMY Left 04/27/2018   Procedure: SALPINGO OOPHORECTOMY;  Surgeon: Benjaman Kindler, MD;  Location: [facility];  Service: Gynecology;  Laterality: Left;   SUPRACERVICAL ABDOMINAL HYSTERECTOMY N/A 04/27/2018   Procedure: ATTEMPTED LAPAROSCOPIC HYSTERECTOMY CONVERTED TO SUPRACERVICAL ABDOMINAL HYSTERECTOMY;  Surgeon: Benjaman Kindler, MD;  Location: [facility];  Service: Gynecology;  Laterality: N/A;   UNILATERAL SALPINGECTOMY Right 04/27/2018   Procedure: UNILATERAL SALPINGECTOMY;  Surgeon: Benjaman Kindler, MD;  Location: [facility];  Service: Gynecology;  Laterality: Right;   VENOUS THROMBECTOMY  1992    OB History   No obstetric history on file.      Home Medications    Prior to Admission medications   Medication Sig Start Date End Date Taking? Authorizing Provider  acetaminophen (TYLENOL) 325 MG tablet Take by mouth.    [provider]  aspirin 81 MG chewable tablet Chew by mouth.    [provider]  omeprazole (PRILOSEC OTC) 20 MG tablet Take by mouth.    [provider]    Family History Family History  Problem Relation Age of Onset   Diabetes Mother    Hypertension Mother    Addison's disease Mother     Social History Social History   Tobacco Use   Smoking status: Never   Smokeless tobacco: Never  Vaping Use   Vaping Use: Never used   Substance Use Topics   Alcohol use: Yes    Comment: occassional   Drug use: No     Allergies   Patient has no known allergies.   Review of Systems Review of Systems  Constitutional: Negative.   HENT: Negative.    Respiratory: Negative.    Cardiovascular: Negative.   Gastrointestinal:  Positive for abdominal distention and abdominal pain. Negative for anal bleeding, blood in stool, constipation, diarrhea, nausea, rectal pain and vomiting.     Physical Exam Triage Vital Signs ED Triage Vitals  Enc Vitals Group     BP 01/16/23 1839 (!) 138/90     Pulse Rate 01/16/23 1837 63     Resp 01/16/23 1837 16     Temp 01/16/23 1837 97.9 F (36.6 C)     Temp Source 01/16/23 1837 Oral     SpO2 01/16/23 1837 95 %     Weight --      Height --      Head Circumference --      Peak Flow --      Pain Score 01/16/23 1836 3     Pain Loc --      Pain Edu? --      Excl. in [place]? --    No data found.  Updated Vital Signs BP (!) 138/90   Pulse 63   Temp 97.9 F (36.6 C) (Oral)   Resp 16   LMP 04/22/2018   SpO2 95%   Visual Acuity Right Eye Distance:   Left Eye Distance:   Bilateral Distance:    Right Eye Near:   Left Eye Near:    Bilateral Near:     Physical Exam Constitutional:      Appearance: Normal appearance.  Eyes:     Extraocular Movements: Extraocular movements intact.  Pulmonary:     Effort: Pulmonary effort is normal.  Abdominal:     General: Abdomen is flat. Bowel sounds are normal. There is no distension.     Palpations: Abdomen is soft.     Tenderness: There is no abdominal tenderness. There is no guarding.  Skin:    General: Skin is warm and dry.  Neurological:     Mental Status: She is alert and oriented to person, place, and time. Mental status is at baseline.      UC Treatments / Results  Labs (all labs ordered are listed, but only abnormal results are displayed) Labs Reviewed - No data to display  EKG   Radiology No results  found.  Procedures Procedures (including critical care time)  Medications Ordered in UC Medications - No data to display  Initial Impression / Assessment and Plan / UC Course  I have reviewed the triage vital signs and the nursing notes.  Pertinent labs & imaging results that were available during my care of the patient were reviewed by me and considered in my medical decision making (see chart for details).  GERD without esophagitis  Vital signs stable patient is in no signs of distress and nontoxic-appearing, low suspicion for more serious organ involvement or infection due to timeline of illness, discussed this with patient and  spouse, etiology is most likely GERD based on presentation and symptomology, discussed this with patient, increased omeprazole dose from 20 mg daily to 40 as well as prescribed Maalox, discussed dietary triggers and avoidance, recommended a bland diet until symptoms have resolved, may use additional over-the-counter medications as needed, given referral to GI if symptoms continue to persist or worsen Final Clinical Impressions(s) / UC Diagnoses   Final diagnoses:  None   Discharge Instructions   None    ED Prescriptions   None    PDMP not reviewed this encounter.   Hans Eden, NP 01/16/23 1921

## 2023-01-16 NOTE — Discharge Instructions (Addendum)
Today you are being treated for a flare of your acid reflux, low suspicion for infection or more serious organ involvement due to timeline of your illness and based on your examination  Will increase your omeprazole dose from 20 mg daily to twice daily, additional medication has been sent to pharmacy, if this is more expensive than over-the-counter medicine purchased over-the-counter  Begin use of Maalox before each meal, taking at least 30 minutes prior, this will help to coat the stomach and help reduce gas from forming  Till symptoms are resolved please follow a bland diet avoiding spicy and greasy foods as well as dairy as this is your known trigger  Continue lactate if you are going to continue eating dairy to help minimize symptoms  If your symptoms continue to persist you may follow-up with gastrointestinal, information is listed on front page

## 2024-07-23 ENCOUNTER — Other Ambulatory Visit: Payer: Self-pay | Admitting: Obstetrics and Gynecology

## 2024-07-23 DIAGNOSIS — Z1231 Encounter for screening mammogram for malignant neoplasm of breast: Secondary | ICD-10-CM

## 2024-09-18 ENCOUNTER — Ambulatory Visit
Admission: RE | Admit: 2024-09-18 | Discharge: 2024-09-18 | Disposition: A | Discharge status: Home / Self Care | Payer: Self-pay | Source: Ambulatory Visit | Attending: Obstetrics and Gynecology | Admitting: Obstetrics and Gynecology

## 2024-09-18 DIAGNOSIS — Z1231 Encounter for screening mammogram for malignant neoplasm of breast: Secondary | ICD-10-CM | POA: Diagnosis present
